# Patient Record
Sex: Male | Born: 1950
Health system: Southern US, Community
[De-identification: ages and names within clinical notes are randomized; demographics above are authoritative.]

## PROBLEM LIST (undated history)

## (undated) DIAGNOSIS — K2970 Gastritis, unspecified, without bleeding: Secondary | ICD-10-CM

## (undated) DIAGNOSIS — K219 Gastro-esophageal reflux disease without esophagitis: Secondary | ICD-10-CM

## (undated) DIAGNOSIS — E559 Vitamin D deficiency, unspecified: Secondary | ICD-10-CM

## (undated) DIAGNOSIS — IMO0002 Reserved for concepts with insufficient information to code with codable children: Secondary | ICD-10-CM

## (undated) DIAGNOSIS — N200 Calculus of kidney: Secondary | ICD-10-CM

## (undated) DIAGNOSIS — Z9889 Other specified postprocedural states: Secondary | ICD-10-CM

## (undated) DIAGNOSIS — E119 Type 2 diabetes mellitus without complications: Secondary | ICD-10-CM

## (undated) DIAGNOSIS — M549 Dorsalgia, unspecified: Secondary | ICD-10-CM

## (undated) DIAGNOSIS — N400 Enlarged prostate without lower urinary tract symptoms: Secondary | ICD-10-CM

## (undated) DIAGNOSIS — T7840XA Allergy, unspecified, initial encounter: Secondary | ICD-10-CM

## (undated) DIAGNOSIS — I10 Essential (primary) hypertension: Secondary | ICD-10-CM

## (undated) DIAGNOSIS — R112 Nausea with vomiting, unspecified: Secondary | ICD-10-CM

## (undated) DIAGNOSIS — Z91030 Bee allergy status: Secondary | ICD-10-CM

## (undated) DIAGNOSIS — E669 Obesity, unspecified: Secondary | ICD-10-CM

## (undated) DIAGNOSIS — E7800 Pure hypercholesterolemia, unspecified: Secondary | ICD-10-CM

## (undated) DIAGNOSIS — IMO0001 Reserved for inherently not codable concepts without codable children: Secondary | ICD-10-CM

## (undated) HISTORY — DX: Reserved for inherently not codable concepts without codable children: IMO0001

## (undated) HISTORY — DX: Vitamin D deficiency, unspecified: E55.9

## (undated) HISTORY — PX: FRACTURE SURGERY: SHX138

## (undated) HISTORY — DX: Gastro-esophageal reflux disease without esophagitis: K21.9

## (undated) HISTORY — DX: Other specified postprocedural states: Z98.890

## (undated) HISTORY — PX: EYE SURGERY: SHX253

## (undated) HISTORY — DX: Bee allergy status: Z91.030

## (undated) HISTORY — PX: INGUINAL HERNIA REPAIR: SHX194

## (undated) HISTORY — DX: Allergy, unspecified, initial encounter: T78.40XA

## (undated) HISTORY — DX: Other specified postprocedural states: R11.2

## (undated) HISTORY — DX: Pure hypercholesterolemia, unspecified: E78.00

## (undated) HISTORY — DX: Dorsalgia, unspecified: M54.9

## (undated) HISTORY — DX: Essential (primary) hypertension: I10

## (undated) HISTORY — DX: Reserved for concepts with insufficient information to code with codable children: IMO0002

## (undated) HISTORY — DX: Calculus of kidney: N20.0

## (undated) HISTORY — DX: Obesity, unspecified: E66.9

## (undated) HISTORY — DX: Benign prostatic hyperplasia without lower urinary tract symptoms: N40.0

## (undated) HISTORY — DX: Gastritis, unspecified, without bleeding: K29.70

## (undated) HISTORY — PX: OTHER SURGICAL HISTORY: SHX169

---

## 1998-05-09 ENCOUNTER — Other Ambulatory Visit: Admission: RE | Admit: 1998-05-09 | Discharge: 1998-05-09 | Payer: Self-pay | Admitting: Urology

## 1998-06-14 ENCOUNTER — Encounter: Payer: Self-pay | Admitting: Urology

## 1998-06-14 ENCOUNTER — Ambulatory Visit (HOSPITAL_COMMUNITY): Admission: RE | Admit: 1998-06-14 | Discharge: 1998-06-14 | Payer: Self-pay | Admitting: Urology

## 1999-07-20 ENCOUNTER — Ambulatory Visit (HOSPITAL_BASED_OUTPATIENT_CLINIC_OR_DEPARTMENT_OTHER): Admission: RE | Admit: 1999-07-20 | Discharge: 1999-07-20 | Payer: Self-pay | Admitting: Surgery

## 2000-10-27 ENCOUNTER — Other Ambulatory Visit: Admission: RE | Admit: 2000-10-27 | Discharge: 2000-10-27 | Payer: Self-pay | Admitting: Gastroenterology

## 2003-08-16 LAB — HM MAMMOGRAPHY

## 2006-01-07 ENCOUNTER — Ambulatory Visit: Payer: Self-pay | Admitting: Gastroenterology

## 2006-01-24 ENCOUNTER — Ambulatory Visit: Payer: Self-pay | Admitting: Gastroenterology

## 2010-09-10 ENCOUNTER — Encounter: Payer: Self-pay | Admitting: Family Medicine

## 2010-09-11 ENCOUNTER — Encounter: Payer: Self-pay | Admitting: Family Medicine

## 2011-04-02 ENCOUNTER — Encounter: Payer: Self-pay | Admitting: Gastroenterology

## 2011-05-04 DIAGNOSIS — K219 Gastro-esophageal reflux disease without esophagitis: Secondary | ICD-10-CM | POA: Insufficient documentation

## 2012-07-11 ENCOUNTER — Inpatient Hospital Stay (HOSPITAL_COMMUNITY)
Admission: EM | Admit: 2012-07-11 | Discharge: 2012-07-23 | DRG: 957 | Disposition: A | Discharge status: Rehabilitation Facility | Payer: 59 | Attending: General Surgery | Admitting: General Surgery

## 2012-07-11 ENCOUNTER — Encounter (HOSPITAL_COMMUNITY): Admission: EM | Disposition: A | Discharge status: Rehabilitation Facility | Payer: Self-pay | Source: Home / Self Care

## 2012-07-11 ENCOUNTER — Emergency Department (HOSPITAL_COMMUNITY): Payer: 59

## 2012-07-11 ENCOUNTER — Encounter (HOSPITAL_COMMUNITY): Payer: Self-pay | Admitting: Anesthesiology

## 2012-07-11 ENCOUNTER — Inpatient Hospital Stay (HOSPITAL_COMMUNITY): Payer: 59 | Admitting: Anesthesiology

## 2012-07-11 ENCOUNTER — Encounter (HOSPITAL_COMMUNITY): Payer: Self-pay | Admitting: Adult Health

## 2012-07-11 DIAGNOSIS — I9589 Other hypotension: Secondary | ICD-10-CM | POA: Diagnosis not present

## 2012-07-11 DIAGNOSIS — T50995A Adverse effect of other drugs, medicaments and biological substances, initial encounter: Secondary | ICD-10-CM | POA: Diagnosis not present

## 2012-07-11 DIAGNOSIS — E119 Type 2 diabetes mellitus without complications: Secondary | ICD-10-CM | POA: Diagnosis present

## 2012-07-11 DIAGNOSIS — S329XXA Fracture of unspecified parts of lumbosacral spine and pelvis, initial encounter for closed fracture: Secondary | ICD-10-CM

## 2012-07-11 DIAGNOSIS — W11XXXA Fall on and from ladder, initial encounter: Secondary | ICD-10-CM | POA: Diagnosis present

## 2012-07-11 DIAGNOSIS — N200 Calculus of kidney: Secondary | ICD-10-CM | POA: Diagnosis present

## 2012-07-11 DIAGNOSIS — S52501A Unspecified fracture of the lower end of right radius, initial encounter for closed fracture: Secondary | ICD-10-CM

## 2012-07-11 DIAGNOSIS — S5290XA Unspecified fracture of unspecified forearm, initial encounter for closed fracture: Secondary | ICD-10-CM

## 2012-07-11 DIAGNOSIS — Z79899 Other long term (current) drug therapy: Secondary | ICD-10-CM

## 2012-07-11 DIAGNOSIS — Y93H2 Activity, gardening and landscaping: Secondary | ICD-10-CM

## 2012-07-11 DIAGNOSIS — J690 Pneumonitis due to inhalation of food and vomit: Secondary | ICD-10-CM | POA: Diagnosis not present

## 2012-07-11 DIAGNOSIS — S42401B Unspecified fracture of lower end of right humerus, initial encounter for open fracture: Secondary | ICD-10-CM

## 2012-07-11 DIAGNOSIS — S32401A Unspecified fracture of right acetabulum, initial encounter for closed fracture: Secondary | ICD-10-CM

## 2012-07-11 DIAGNOSIS — E7800 Pure hypercholesterolemia, unspecified: Secondary | ICD-10-CM | POA: Diagnosis present

## 2012-07-11 DIAGNOSIS — N170 Acute kidney failure with tubular necrosis: Secondary | ICD-10-CM | POA: Diagnosis not present

## 2012-07-11 DIAGNOSIS — S32409A Unspecified fracture of unspecified acetabulum, initial encounter for closed fracture: Secondary | ICD-10-CM

## 2012-07-11 DIAGNOSIS — S3282XA Multiple fractures of pelvis without disruption of pelvic ring, initial encounter for closed fracture: Principal | ICD-10-CM | POA: Diagnosis present

## 2012-07-11 DIAGNOSIS — S52539A Colles' fracture of unspecified radius, initial encounter for closed fracture: Secondary | ICD-10-CM | POA: Diagnosis present

## 2012-07-11 DIAGNOSIS — K567 Ileus, unspecified: Secondary | ICD-10-CM | POA: Diagnosis not present

## 2012-07-11 DIAGNOSIS — R112 Nausea with vomiting, unspecified: Secondary | ICD-10-CM | POA: Diagnosis present

## 2012-07-11 DIAGNOSIS — D62 Acute posthemorrhagic anemia: Secondary | ICD-10-CM | POA: Diagnosis not present

## 2012-07-11 DIAGNOSIS — E876 Hypokalemia: Secondary | ICD-10-CM | POA: Diagnosis not present

## 2012-07-11 DIAGNOSIS — S42409B Unspecified fracture of lower end of unspecified humerus, initial encounter for open fracture: Secondary | ICD-10-CM

## 2012-07-11 DIAGNOSIS — S51001A Unspecified open wound of right elbow, initial encounter: Secondary | ICD-10-CM

## 2012-07-11 DIAGNOSIS — E669 Obesity, unspecified: Secondary | ICD-10-CM | POA: Diagnosis present

## 2012-07-11 DIAGNOSIS — Y838 Other surgical procedures as the cause of abnormal reaction of the patient, or of later complication, without mention of misadventure at the time of the procedure: Secondary | ICD-10-CM | POA: Diagnosis not present

## 2012-07-11 DIAGNOSIS — K560 Paralytic ileus: Secondary | ICD-10-CM | POA: Diagnosis not present

## 2012-07-11 DIAGNOSIS — I10 Essential (primary) hypertension: Secondary | ICD-10-CM | POA: Diagnosis present

## 2012-07-11 DIAGNOSIS — N179 Acute kidney failure, unspecified: Secondary | ICD-10-CM | POA: Diagnosis not present

## 2012-07-11 DIAGNOSIS — J95821 Acute postprocedural respiratory failure: Secondary | ICD-10-CM | POA: Diagnosis not present

## 2012-07-11 DIAGNOSIS — D649 Anemia, unspecified: Secondary | ICD-10-CM

## 2012-07-11 DIAGNOSIS — S32309A Unspecified fracture of unspecified ilium, initial encounter for closed fracture: Secondary | ICD-10-CM

## 2012-07-11 DIAGNOSIS — N400 Enlarged prostate without lower urinary tract symptoms: Secondary | ICD-10-CM | POA: Diagnosis present

## 2012-07-11 DIAGNOSIS — K929 Disease of digestive system, unspecified: Secondary | ICD-10-CM | POA: Diagnosis not present

## 2012-07-11 DIAGNOSIS — W14XXXA Fall from tree, initial encounter: Secondary | ICD-10-CM

## 2012-07-11 DIAGNOSIS — E870 Hyperosmolality and hypernatremia: Secondary | ICD-10-CM | POA: Diagnosis not present

## 2012-07-11 DIAGNOSIS — S32599A Other specified fracture of unspecified pubis, initial encounter for closed fracture: Secondary | ICD-10-CM

## 2012-07-11 DIAGNOSIS — Z7982 Long term (current) use of aspirin: Secondary | ICD-10-CM

## 2012-07-11 DIAGNOSIS — T1490XA Injury, unspecified, initial encounter: Secondary | ICD-10-CM

## 2012-07-11 DIAGNOSIS — S52123B Displaced fracture of head of unspecified radius, initial encounter for open fracture type I or II: Secondary | ICD-10-CM | POA: Diagnosis present

## 2012-07-11 HISTORY — PX: OPEN REDUCTION INTERNAL FIXATION (ORIF) DISTAL RADIAL FRACTURE: SHX5989

## 2012-07-11 HISTORY — DX: Type 2 diabetes mellitus without complications: E11.9

## 2012-07-11 HISTORY — PX: ORIF RADIAL FRACTURE: SHX5113

## 2012-07-11 LAB — URINALYSIS, MICROSCOPIC ONLY
Bilirubin Urine: NEGATIVE
Nitrite: NEGATIVE
Specific Gravity, Urine: 1.019 (ref 1.005–1.030)
pH: 5.5 (ref 5.0–8.0)

## 2012-07-11 LAB — CBC
HCT: 32.2 % — ABNORMAL LOW (ref 39.0–52.0)
Hemoglobin: 11 g/dL — ABNORMAL LOW (ref 13.0–17.0)
MCH: 30.4 pg (ref 26.0–34.0)
MCHC: 34.2 g/dL (ref 30.0–36.0)
RDW: 13.2 % (ref 11.5–15.5)

## 2012-07-11 LAB — COMPREHENSIVE METABOLIC PANEL
Albumin: 3.2 g/dL — ABNORMAL LOW (ref 3.5–5.2)
BUN: 18 mg/dL (ref 6–23)
Calcium: 8.5 mg/dL (ref 8.4–10.5)
GFR calc Af Amer: 68 mL/min — ABNORMAL LOW (ref >90)
Glucose, Bld: 204 mg/dL — ABNORMAL HIGH (ref 70–99)
Sodium: 141 mEq/L (ref 135–145)
Total Protein: 6 g/dL (ref 6.0–8.3)

## 2012-07-11 LAB — POCT I-STAT 4, (NA,K, GLUC, HGB,HCT)
Glucose, Bld: 181 mg/dL — ABNORMAL HIGH (ref 70–99)
HCT: 26 % — ABNORMAL LOW (ref 39.0–52.0)
Hemoglobin: 8.8 g/dL — ABNORMAL LOW (ref 13.0–17.0)
Potassium: 3.7 mEq/L (ref 3.5–5.1)
Sodium: 146 mEq/L — ABNORMAL HIGH (ref 135–145)

## 2012-07-11 LAB — POCT I-STAT, CHEM 8
BUN: 17 mg/dL (ref 6–23)
Calcium, Ion: 0.92 mmol/L — ABNORMAL LOW (ref 1.13–1.30)
Creatinine, Ser: 1.3 mg/dL (ref 0.50–1.35)
HCT: 24 % — ABNORMAL LOW (ref 39.0–52.0)
Potassium: 3.2 mEq/L — ABNORMAL LOW (ref 3.5–5.1)
Sodium: 145 mEq/L (ref 135–145)
TCO2: 15 mmol/L (ref 0–100)

## 2012-07-11 LAB — GLUCOSE, CAPILLARY: Glucose-Capillary: 202 mg/dL — ABNORMAL HIGH (ref 70–99)

## 2012-07-11 LAB — PROTIME-INR: Prothrombin Time: 14.3 seconds (ref 11.6–15.2)

## 2012-07-11 LAB — ABO/RH: ABO/RH(D): O NEG

## 2012-07-11 SURGERY — OPEN REDUCTION INTERNAL FIXATION (ORIF) DISTAL RADIUS FRACTURE
Anesthesia: General | Site: Wrist | Laterality: Right | Wound class: Clean

## 2012-07-11 MED ORDER — PROMETHAZINE HCL 25 MG/ML IJ SOLN: 6.2500 mg | INTRAMUSCULAR | Status: DC | PRN

## 2012-07-11 MED ORDER — FENTANYL CITRATE 0.05 MG/ML IJ SOLN: 100.0000 ug | Freq: Once | INTRAMUSCULAR | Status: AC

## 2012-07-11 MED ORDER — GLYCOPYRROLATE 0.2 MG/ML IJ SOLN
INTRAMUSCULAR | Status: DC | PRN
  Administered 2012-07-11: .6 mg via INTRAVENOUS

## 2012-07-11 MED ORDER — CEFAZOLIN SODIUM-DEXTROSE 2-3 GM-% IV SOLR
INTRAVENOUS | Status: DC | PRN
  Administered 2012-07-11: 2 g via INTRAVENOUS

## 2012-07-11 MED ORDER — KCL IN DEXTROSE-NACL 20-5-0.45 MEQ/L-%-% IV SOLN
INTRAVENOUS | Status: DC
  Administered 2012-07-12: 1000 mL via INTRAVENOUS
  Administered 2012-07-12 – 2012-07-14 (×3): 100 mL/h via INTRAVENOUS
  Administered 2012-07-16: 07:00:00 via INTRAVENOUS
  Filled 2012-07-11 (×15): qty 1000

## 2012-07-11 MED ORDER — ALBUMIN HUMAN 5 % IV SOLN
INTRAVENOUS | Status: DC | PRN
  Administered 2012-07-11 (×3): via INTRAVENOUS

## 2012-07-11 MED ORDER — CEFAZOLIN SODIUM 1-5 GM-% IV SOLN
1.0000 g | Freq: Once | INTRAVENOUS | Status: AC
  Administered 2012-07-11: 1 g via INTRAVENOUS
  Filled 2012-07-11: qty 50

## 2012-07-11 MED ORDER — OXYCODONE HCL 5 MG/5ML PO SOLN: 5.0000 mg | Freq: Once | ORAL | Status: AC | PRN

## 2012-07-11 MED ORDER — ONDANSETRON HCL 4 MG/2ML IJ SOLN
4.0000 mg | Freq: Four times a day (QID) | INTRAMUSCULAR | Status: DC | PRN
  Administered 2012-07-13 (×2): 4 mg via INTRAVENOUS

## 2012-07-11 MED ORDER — MIDAZOLAM HCL 5 MG/5ML IJ SOLN
INTRAMUSCULAR | Status: DC | PRN
  Administered 2012-07-11: 1 mg via INTRAVENOUS

## 2012-07-11 MED ORDER — POTASSIUM CHLORIDE 10 MEQ/100ML IV SOLN: 10.0000 meq | Freq: Once | INTRAVENOUS | Status: DC

## 2012-07-11 MED ORDER — LIDOCAINE HCL (CARDIAC) 20 MG/ML IV SOLN
INTRAVENOUS | Status: DC | PRN
  Administered 2012-07-11: 100 mg via INTRAVENOUS

## 2012-07-11 MED ORDER — SODIUM CHLORIDE 0.9 % IJ SOLN
9.0000 mL | INTRAMUSCULAR | Status: DC | PRN
  Administered 2012-07-14: 12:00:00 via INTRAVENOUS

## 2012-07-11 MED ORDER — SUCCINYLCHOLINE CHLORIDE 20 MG/ML IJ SOLN
INTRAMUSCULAR | Status: DC | PRN
  Administered 2012-07-11: 120 mg via INTRAVENOUS

## 2012-07-11 MED ORDER — NEOSTIGMINE METHYLSULFATE 1 MG/ML IJ SOLN
INTRAMUSCULAR | Status: DC | PRN
  Administered 2012-07-11: 3.5 mg via INTRAVENOUS

## 2012-07-11 MED ORDER — NALOXONE HCL 0.4 MG/ML IJ SOLN: 0.4000 mg | INTRAMUSCULAR | Status: DC | PRN

## 2012-07-11 MED ORDER — LACTATED RINGERS IV SOLN
INTRAVENOUS | Status: DC | PRN
  Administered 2012-07-11 (×2): via INTRAVENOUS

## 2012-07-11 MED ORDER — HYDROMORPHONE 0.3 MG/ML IV SOLN
INTRAVENOUS | Status: AC
  Filled 2012-07-11: qty 25

## 2012-07-11 MED ORDER — ONDANSETRON HCL 4 MG/2ML IJ SOLN
INTRAMUSCULAR | Status: AC
  Filled 2012-07-11: qty 2

## 2012-07-11 MED ORDER — ONDANSETRON HCL 4 MG/2ML IJ SOLN
INTRAMUSCULAR | Status: AC
  Administered 2012-07-11: 4 mg
  Filled 2012-07-11: qty 2

## 2012-07-11 MED ORDER — 0.9 % SODIUM CHLORIDE (POUR BTL) OPTIME
TOPICAL | Status: DC | PRN
  Administered 2012-07-11: 1000 mL

## 2012-07-11 MED ORDER — FENTANYL CITRATE 0.05 MG/ML IJ SOLN
100.0000 ug | Freq: Once | INTRAMUSCULAR | Status: AC
  Administered 2012-07-11: 100 ug via INTRAVENOUS

## 2012-07-11 MED ORDER — DIPHENHYDRAMINE HCL 12.5 MG/5ML PO ELIX
12.5000 mg | ORAL_SOLUTION | Freq: Four times a day (QID) | ORAL | Status: DC | PRN
  Filled 2012-07-11: qty 5

## 2012-07-11 MED ORDER — FENTANYL CITRATE 0.05 MG/ML IJ SOLN
INTRAMUSCULAR | Status: DC | PRN
  Administered 2012-07-11 (×6): 50 ug via INTRAVENOUS

## 2012-07-11 MED ORDER — PROPOFOL 10 MG/ML IV BOLUS
INTRAVENOUS | Status: DC | PRN
  Administered 2012-07-11: 150 mg via INTRAVENOUS

## 2012-07-11 MED ORDER — PHENYLEPHRINE HCL 10 MG/ML IJ SOLN
INTRAMUSCULAR | Status: DC | PRN
  Administered 2012-07-11 (×6): 40 ug via INTRAVENOUS

## 2012-07-11 MED ORDER — SODIUM CHLORIDE 0.9 % IV SOLN
INTRAVENOUS | Status: DC | PRN
  Administered 2012-07-11: 19:00:00 via INTRAVENOUS

## 2012-07-11 MED ORDER — IOHEXOL 300 MG/ML  SOLN
100.0000 mL | Freq: Once | INTRAMUSCULAR | Status: AC | PRN
  Administered 2012-07-11: 100 mL via INTRAVENOUS

## 2012-07-11 MED ORDER — FENTANYL CITRATE 0.05 MG/ML IJ SOLN
100.0000 ug | Freq: Once | INTRAMUSCULAR | Status: AC
  Administered 2012-07-11: 100 ug via INTRAVENOUS
  Filled 2012-07-11: qty 2

## 2012-07-11 MED ORDER — ARTIFICIAL TEARS OP OINT
TOPICAL_OINTMENT | OPHTHALMIC | Status: DC | PRN
  Administered 2012-07-11: 1 via OPHTHALMIC

## 2012-07-11 MED ORDER — FENTANYL CITRATE 0.05 MG/ML IJ SOLN
INTRAMUSCULAR | Status: AC
  Administered 2012-07-11: 100 ug via INTRAVENOUS
  Filled 2012-07-11: qty 2

## 2012-07-11 MED ORDER — ONDANSETRON HCL 4 MG/2ML IJ SOLN
INTRAMUSCULAR | Status: DC | PRN
  Administered 2012-07-11: 4 mg via INTRAVENOUS

## 2012-07-11 MED ORDER — DIPHENHYDRAMINE HCL 50 MG/ML IJ SOLN: 12.5000 mg | Freq: Four times a day (QID) | INTRAMUSCULAR | Status: DC | PRN

## 2012-07-11 MED ORDER — ACETAMINOPHEN 10 MG/ML IV SOLN
INTRAVENOUS | Status: DC | PRN
  Administered 2012-07-11: 1000 mg via INTRAVENOUS

## 2012-07-11 MED ORDER — FENTANYL CITRATE 0.05 MG/ML IJ SOLN
INTRAMUSCULAR | Status: AC
  Filled 2012-07-11: qty 2

## 2012-07-11 MED ORDER — HYDROMORPHONE HCL PF 1 MG/ML IJ SOLN: 0.2500 mg | INTRAMUSCULAR | Status: DC | PRN

## 2012-07-11 MED ORDER — OXYCODONE HCL 5 MG PO TABS: 5.0000 mg | ORAL_TABLET | Freq: Once | ORAL | Status: AC | PRN

## 2012-07-11 MED ORDER — PHENYLEPHRINE HCL 10 MG/ML IJ SOLN
10.0000 mg | INTRAVENOUS | Status: DC | PRN
  Administered 2012-07-11: 50 ug/min via INTRAVENOUS

## 2012-07-11 MED ORDER — DEXTROSE 5 % IV SOLN
INTRAVENOUS | Status: DC | PRN
  Administered 2012-07-11: 19:00:00 via INTRAVENOUS

## 2012-07-11 MED ORDER — CEFAZOLIN SODIUM 1-5 GM-% IV SOLN
1.0000 g | Freq: Three times a day (TID) | INTRAVENOUS | Status: DC
  Administered 2012-07-12 – 2012-07-14 (×8): 1 g via INTRAVENOUS
  Filled 2012-07-11 (×9): qty 50

## 2012-07-11 MED ORDER — DIPHENHYDRAMINE HCL 50 MG/ML IJ SOLN
INTRAMUSCULAR | Status: DC | PRN
  Administered 2012-07-11: 25 mg via INTRAVENOUS

## 2012-07-11 MED ORDER — HYDROMORPHONE 0.3 MG/ML IV SOLN
INTRAVENOUS | Status: DC
  Administered 2012-07-12: 3 mg via INTRAVENOUS
  Administered 2012-07-12: 2.4 mg via INTRAVENOUS
  Administered 2012-07-12: 2.1 mg via INTRAVENOUS
  Administered 2012-07-12: 2.4 mg via INTRAVENOUS
  Administered 2012-07-13: 1.5 mg via INTRAVENOUS
  Administered 2012-07-13: 0.3 mg via INTRAVENOUS
  Administered 2012-07-13: 0.9 mg via INTRAVENOUS
  Administered 2012-07-13: 0.6 mg via INTRAVENOUS
  Administered 2012-07-14: 0.9 mg via INTRAVENOUS
  Administered 2012-07-14: 0.3 mg via INTRAVENOUS
  Administered 2012-07-14: 0.9 mg via INTRAVENOUS
  Filled 2012-07-11 (×3): qty 25

## 2012-07-11 MED ORDER — ROCURONIUM BROMIDE 100 MG/10ML IV SOLN
INTRAVENOUS | Status: DC | PRN
  Administered 2012-07-11: 10 mg via INTRAVENOUS
  Administered 2012-07-11: 20 mg via INTRAVENOUS
  Administered 2012-07-11: 50 mg via INTRAVENOUS
  Administered 2012-07-11: 10 mg via INTRAVENOUS

## 2012-07-11 SURGICAL SUPPLY — 70 items
BANDAGE ELASTIC 3 VELCRO ST LF (GAUZE/BANDAGES/DRESSINGS) ×4 IMPLANT
BANDAGE ELASTIC 4 VELCRO ST LF (GAUZE/BANDAGES/DRESSINGS) ×8 IMPLANT
BANDAGE GAUZE ELAST BULKY 4 IN (GAUZE/BANDAGES/DRESSINGS) ×8 IMPLANT
BIT DRILL 2 FAST STEP (BIT) ×4 IMPLANT
BIT DRILL 2.5X4 QC (BIT) ×4 IMPLANT
BLADE LONG MED 31X9 (MISCELLANEOUS) ×4 IMPLANT
BLADE SURG 15 STRL LF DISP TIS (BLADE) ×3 IMPLANT
BLADE SURG 15 STRL SS (BLADE) ×4
BNDG CMPR 9X4 STRL LF SNTH (GAUZE/BANDAGES/DRESSINGS) ×3
BNDG ESMARK 4X9 LF (GAUZE/BANDAGES/DRESSINGS) ×4 IMPLANT
CLOTH BEACON ORANGE TIMEOUT ST (SAFETY) ×4 IMPLANT
CORDS BIPOLAR (ELECTRODE) ×4 IMPLANT
COVER SURGICAL LIGHT HANDLE (MISCELLANEOUS) ×4 IMPLANT
CUFF TOURNIQUET SINGLE 18IN (TOURNIQUET CUFF) ×4 IMPLANT
CUFF TOURNIQUET SINGLE 24IN (TOURNIQUET CUFF) IMPLANT
DRAPE C-ARM MINI 42X72 WSTRAPS (DRAPES) ×4 IMPLANT
DRAPE OEC MINIVIEW 54X84 (DRAPES) ×4 IMPLANT
DRAPE SURG 17X23 STRL (DRAPES) ×4 IMPLANT
DRSG ADAPTIC 3X8 NADH LF (GAUZE/BANDAGES/DRESSINGS) ×4 IMPLANT
DRSG EMULSION OIL 3X3 NADH (GAUZE/BANDAGES/DRESSINGS) ×8 IMPLANT
DURAPREP 26ML APPLICATOR (WOUND CARE) ×4 IMPLANT
ELECT REM PT RETURN 9FT ADLT (ELECTROSURGICAL) ×4
ELECTRODE REM PT RTRN 9FT ADLT (ELECTROSURGICAL) ×3 IMPLANT
GAUZE SPONGE 4X4 16PLY XRAY LF (GAUZE/BANDAGES/DRESSINGS) ×4 IMPLANT
GAUZE XEROFORM 1X8 LF (GAUZE/BANDAGES/DRESSINGS) ×4 IMPLANT
GLOVE BIO SURGEON STRL SZ8.5 (GLOVE) ×4 IMPLANT
GOWN SRG XL XLNG 56XLVL 4 (GOWN DISPOSABLE) ×3 IMPLANT
GOWN STRL NON-REIN LRG LVL3 (GOWN DISPOSABLE) ×4 IMPLANT
GOWN STRL NON-REIN XL XLG LVL4 (GOWN DISPOSABLE) ×4
HEAD EXPLOR 12X24MM (Orthopedic Implant) ×4 IMPLANT
IMPL STEM WITH SCREW 8X29 (Stem) ×3 IMPLANT
IMPLANT STEM WITH SCREW 8X29 (Stem) ×4 IMPLANT
KIT BASIN OR (CUSTOM PROCEDURE TRAY) ×4 IMPLANT
KIT ROOM TURNOVER OR (KITS) ×4 IMPLANT
MANIFOLD NEPTUNE II (INSTRUMENTS) IMPLANT
NEEDLE HYPO 25GX1X1/2 BEV (NEEDLE) IMPLANT
NS IRRIG 1000ML POUR BTL (IV SOLUTION) ×4 IMPLANT
PACK ORTHO EXTREMITY (CUSTOM PROCEDURE TRAY) ×4 IMPLANT
PAD ARMBOARD 7.5X6 YLW CONV (MISCELLANEOUS) ×8 IMPLANT
PAD CAST 3X4 CTTN HI CHSV (CAST SUPPLIES) ×3 IMPLANT
PAD CAST 4YDX4 CTTN HI CHSV (CAST SUPPLIES) ×3 IMPLANT
PADDING CAST COTTON 3X4 STRL (CAST SUPPLIES) ×4
PADDING CAST COTTON 4X4 STRL (CAST SUPPLIES) ×4
PEG SUBCHONDRAL SMOOTH 2.0X22 (Peg) ×4 IMPLANT
PEG SUBCHONDRAL SMOOTH 2.0X24 (Peg) ×16 IMPLANT
PEG SUBCHONDRAL SMOOTH 2.0X26 (Peg) ×8 IMPLANT
PENCIL BUTTON HOLSTER BLD 10FT (ELECTRODE) ×4 IMPLANT
PLATE STAN 24.4X59.5 RT (Plate) ×4 IMPLANT
SCREW CORT 3.5X14 LNG (Screw) ×8 IMPLANT
SCREW CORT 3.5X16 LNG (Screw) ×4 IMPLANT
SPLINT PLASTER CAST XFAST 5X30 (CAST SUPPLIES) ×3 IMPLANT
SPLINT PLASTER XFAST SET 5X30 (CAST SUPPLIES) ×1
SPONGE GAUZE 4X4 12PLY (GAUZE/BANDAGES/DRESSINGS) ×12 IMPLANT
SPONGE LAP 4X18 X RAY DECT (DISPOSABLE) IMPLANT
SPONGE SCRUB IODOPHOR (GAUZE/BANDAGES/DRESSINGS) ×4 IMPLANT
SUCTION FRAZIER TIP 10 FR DISP (SUCTIONS) ×4 IMPLANT
SUT FIBERWIRE 2-0 18 17.9 3/8 (SUTURE) ×4
SUT PROLENE 3 0 PS 2 (SUTURE) IMPLANT
SUT VIC AB 0 CT1 27 (SUTURE) ×8
SUT VIC AB 0 CT1 27XBRD ANBCTR (SUTURE) ×6 IMPLANT
SUT VIC AB 3-0 FS2 27 (SUTURE) IMPLANT
SUT VICRYL 4-0 PS2 18IN ABS (SUTURE) IMPLANT
SUT VICRYL RAPIDE 4/0 PS 2 (SUTURE) ×12 IMPLANT
SUTURE FIBERWR 2-0 18 17.9 3/8 (SUTURE) ×3 IMPLANT
SYR CONTROL 10ML LL (SYRINGE) IMPLANT
TOWEL OR 17X24 6PK STRL BLUE (TOWEL DISPOSABLE) ×4 IMPLANT
TOWEL OR 17X26 10 PK STRL BLUE (TOWEL DISPOSABLE) ×4 IMPLANT
TUBE CONNECTING 12X1/4 (SUCTIONS) ×4 IMPLANT
UNDERPAD 30X30 INCONTINENT (UNDERPADS AND DIAPERS) ×4 IMPLANT
WATER STERILE IRR 1000ML POUR (IV SOLUTION) ×4 IMPLANT

## 2012-07-11 NOTE — Progress Notes (Signed)
Orthopedic Tech Progress Note Patient Details:  Roger Palmer 28-Apr-1951 [id number]  Patient ID: Roger Palmer, male   DOB: 1951-03-23, 62 y.o.   MRN: [id number] Made level 2 trauma visit  Nikki Dom 07/11/2012, 4:01 PM

## 2012-07-11 NOTE — ED Notes (Signed)
Family at beside. Family given emotional support. 

## 2012-07-11 NOTE — Transfer of Care (Signed)
Immediate Anesthesia Transfer of Care Note  Patient: Roger Palmer  Procedure(s) Performed: Procedure(s): OPEN REDUCTION INTERNAL FIXATION (ORIF) DISTAL RADIAL FRACTURE (Right) OPEN REDUCTION INTERNAL FIXATION (ORIF) RADIAL head FRACTURE (Right)  Patient Location: PACU  Anesthesia Type:General  Level of Consciousness: awake and patient cooperative  Airway & Oxygen Therapy: Patient Spontanous Breathing and Patient connected to face mask oxygen  Post-op Assessment: Report given to PACU RN and Post -op Vital signs reviewed and stable  Post vital signs: Reviewed and stable  Complications: No apparent anesthesia complications

## 2012-07-11 NOTE — ED Notes (Signed)
Family updated as to patient's status.

## 2012-07-11 NOTE — ED Notes (Signed)
Pt to CT with RN accompanyment. BP cuff remoeved for CT contrast, right arm break.

## 2012-07-11 NOTE — ED Notes (Signed)
Pt on ladder at home working on tree and fell off ladder landing on right side. Did not hit head, denies LOC, denies neck pain. Right wrist and right elbow deformity, open wound to right elbow. Right hip pain, rotation noted. CMS intact. GCS 15. Given 10 mg morphine by EMS

## 2012-07-11 NOTE — Op Note (Signed)
See dictated note [phone]

## 2012-07-11 NOTE — Anesthesia Postprocedure Evaluation (Signed)
  Anesthesia Post-op Note  Patient: Roger Palmer  Procedure(s) Performed: Procedure(s): OPEN REDUCTION INTERNAL FIXATION (ORIF) DISTAL RADIAL FRACTURE (Right) OPEN REDUCTION INTERNAL FIXATION (ORIF) RADIAL head FRACTURE (Right)  Patient Location: PACU  Anesthesia Type:General  Level of Consciousness: awake  Airway and Oxygen Therapy: Patient Spontanous Breathing  Post-op Pain: mild  Post-op Assessment: Post-op Vital signs reviewed, Patient's Cardiovascular Status Stable, Respiratory Function Stable, Patent Airway, No signs of Nausea or vomiting and Pain level controlled  Post-op Vital Signs: stable  Complications: No apparent anesthesia complications

## 2012-07-11 NOTE — ED Notes (Signed)
Dr.Beaton shown results of Istat Chem8. ED-Lab.

## 2012-07-11 NOTE — Consult Note (Signed)
Roger Heap, MD Chief Complaint: Fall from tree History: Patient was on a 15 foot ladder cutting tree limbs when he fell. He had no loss of consciousness. He fell on his right side. He was transported via [facility] as a level II trauma. On arrival he was evaluated by emergency department physician. He was found to have open right elbow dislocation, right distal radius fracture, right iliac and acetabular fractures. His initial hemoglobin was noted to be low and he was therefore upgraded to a level one trauma. On my arrival patient was in the CT scanner, awake and alert. In addition to the above complaints, he has some tingling in his right hand, nausea, and vomiting.  Past Medical History  Diagnosis Date  . Bee sting allergy   . Hypercholesteremia   . Nephrolithiasis   . Herniated disc     c7-c7, c5-6, c3-4, c4-5  . Hypertension   . Back pain   . Obesity   . BPH (benign prostatic hypertrophy)   . Gastritis   . Reflux   . Diabetes mellitus without complication     No Known Allergies  No current facility-administered medications on file prior to encounter.   Current Outpatient Prescriptions on File Prior to Encounter  Medication Sig Dispense Refill  . dutasteride (AVODART) 0.5 MG capsule Take 0.5 mg by mouth daily.        . metFORMIN (GLUCOPHAGE) 500 MG tablet Take 500 mg by mouth daily.       Marland Kitchen omeprazole (PRILOSEC) 20 MG capsule Take 20 mg by mouth daily.        . quinapril (ACCUPRIL) 40 MG tablet Take 40 mg by mouth at bedtime.        . Tamsulosin HCl (FLOMAX) 0.4 MG CAPS Take 0.4 mg by mouth daily.       . valsartan-hydrochlorothiazide (DIOVAN-HCT) 160-25 MG per tablet Take 1 tablet by mouth daily.          Physical Exam: Filed Vitals:   07/11/12 1805  BP: 127/71  Pulse: 110  Temp:   Resp: 11  A+0 X 3 NVI - EHL/TA/GA intact 2+ DP/PT pulses Compartments soft/NT No SOB/CP Abd soft/NT Pelvis - right sided tenderness.  No ecchymosis/laceration/abrasion      Image: Dg Forearm Right  07/11/2012  *RADIOLOGY REPORT*  Clinical Data:   Fall, right arm pain.  Open fracture.  RIGHT FOREARM - 2 VIEW  Comparison: None.  Findings: Fracture is noted at the right elbow.  The distal humerus projects anteriorly relative to the radius and ulna. It is difficult to evaluate for fracture on these limited views of the elbow.  There is a comminuted distal right radial fracture.  Mild impaction.  Displacement fracture fragments.  No visible distal ulnar fracture.  IMPRESSION: Dislocation at the right elbow.  It is difficult to exclude fracture of the elbow on these projections.  Consider dedicated elbow series.  Comminuted, displaced and impacted distal right radial fracture.   Original Report Authenticated By: Charlett Nose, M.D.    Ct Head Wo Contrast  07/11/2012  *RADIOLOGY REPORT*  Clinical Data:  15 feet fall from ladder  CT HEAD WITHOUT CONTRAST CT CERVICAL SPINE WITHOUT CONTRAST  Technique:  Multidetector CT imaging of the head and cervical spine was performed following the standard protocol without intravenous contrast.  Multiplanar CT image reconstructions of the cervical spine were also generated.  Comparison:  None.  CT HEAD  Findings: No evidence of parenchymal hemorrhage or extra-axial fluid collection. No  mass lesion, mass effect, or midline shift.  No CT evidence of acute infarction.  Cerebral volume is age appropriate.  No ventriculomegaly.  The visualized paranasal sinuses are essentially clear. The mastoid air cells are unopacified.  No evidence of calvarial fracture.  IMPRESSION: No evidence of acute intracranial abnormality.  CT CERVICAL SPINE  Findings: Straightening of cervical spine.  No evidence of fracture or dislocation.  Vertebral body heights are maintained.  The dens appears intact.  No prevertebral soft tissue swelling.  Mild multilevel degenerative changes.  Visualized thyroid is unremarkable.  Visualized lung apices are clear.  IMPRESSION: No evidence  of traumatic injury to the cervical spine.  Mild multilevel degenerative changes.   Original Report Authenticated By: Charline Bills, M.D.    Ct Chest W Contrast  07/11/2012  *RADIOLOGY REPORT*  Clinical Data:  15 feet fall from ladder  CT CHEST, ABDOMEN AND PELVIS WITH CONTRAST  Technique:  Multidetector CT imaging of the chest, abdomen and pelvis was performed following the standard protocol during bolus administration of intravenous contrast.  Contrast:  100 ml Omnipaque-300 IV  Comparison:  None.  CT CHEST  Findings:  No evidence of acute aortic injury.  No mediastinal hematoma.  Mild dependent atelectasis lung bases.  No suspicious pulmonary nodules.  No pleural effusion or pneumothorax.  Visualized thyroid is unremarkable.  The heart is normal in size.  No pericardial effusion.  No suspicious mediastinal, hilar, or axillary lymphadenopathy.  Mild degenerative changes of the thoracic spine.  No fracture is seen.  IMPRESSION: No evidence of traumatic injury to the chest.  CT ABDOMEN AND PELVIS  Findings:  Liver, spleen, pancreas, and adrenal glands are within normal limits.  Gallbladder is unremarkable.  No intrahepatic or extrahepatic ductal dilatation.  2 mm nonobstructing the left upper pole renal calculus (series 2/image 70).  3 mm nonobstructing left lower pole renal calculus (series 2/image 77).  Right kidney is within normal limits.  No hydronephrosis.  No evidence of bowel obstruction.  Normal appendix.  Atherosclerotic calcifications of the abdominal aorta and branch vessels.  No abdominopelvic ascites.  No free air.  Mild prostatomegaly, measuring 5.3 cm in transverse dimension, and notable for dystrophic calcifications.  Bladder is notable for indwelling Foley catheter.  Displaced fracture of the right iliac crest (series 2/image 101) extending to the superior and medial walls of the right acetabulum (series 2/images 117 and 125). Nondisplaced right inferior pubic ramus fracture (series 2/image  131).  Associated small volume right retroperitoneal/pelvic hemorrhage (series 2/image 125).  Mild degenerative changes of the lumbar spine.  IMPRESSION: Displaced fracture of the right iliac crest with associated comminuted acetabular fracture.  Nondisplaced right inferior pubic ramus fracture.  Associated small volume right retroperitoneal/pelvic hemorrhage.   Original Report Authenticated By: Charline Bills, M.D.    Ct Cervical Spine Wo Contrast  07/11/2012  *RADIOLOGY REPORT*  Clinical Data:  15 feet fall from ladder  CT HEAD WITHOUT CONTRAST CT CERVICAL SPINE WITHOUT CONTRAST  Technique:  Multidetector CT imaging of the head and cervical spine was performed following the standard protocol without intravenous contrast.  Multiplanar CT image reconstructions of the cervical spine were also generated.  Comparison:  None.  CT HEAD  Findings: No evidence of parenchymal hemorrhage or extra-axial fluid collection. No mass lesion, mass effect, or midline shift.  No CT evidence of acute infarction.  Cerebral volume is age appropriate.  No ventriculomegaly.  The visualized paranasal sinuses are essentially clear. The mastoid air cells are unopacified.  No evidence  of calvarial fracture.  IMPRESSION: No evidence of acute intracranial abnormality.  CT CERVICAL SPINE  Findings: Straightening of cervical spine.  No evidence of fracture or dislocation.  Vertebral body heights are maintained.  The dens appears intact.  No prevertebral soft tissue swelling.  Mild multilevel degenerative changes.  Visualized thyroid is unremarkable.  Visualized lung apices are clear.  IMPRESSION: No evidence of traumatic injury to the cervical spine.  Mild multilevel degenerative changes.   Original Report Authenticated By: Charline Bills, M.D.    Ct Abdomen Pelvis W Contrast  07/11/2012  *RADIOLOGY REPORT*  Clinical Data:  15 feet fall from ladder  CT CHEST, ABDOMEN AND PELVIS WITH CONTRAST  Technique:  Multidetector CT imaging of the  chest, abdomen and pelvis was performed following the standard protocol during bolus administration of intravenous contrast.  Contrast:  100 ml Omnipaque-300 IV  Comparison:  None.  CT CHEST  Findings:  No evidence of acute aortic injury.  No mediastinal hematoma.  Mild dependent atelectasis lung bases.  No suspicious pulmonary nodules.  No pleural effusion or pneumothorax.  Visualized thyroid is unremarkable.  The heart is normal in size.  No pericardial effusion.  No suspicious mediastinal, hilar, or axillary lymphadenopathy.  Mild degenerative changes of the thoracic spine.  No fracture is seen.  IMPRESSION: No evidence of traumatic injury to the chest.  CT ABDOMEN AND PELVIS  Findings:  Liver, spleen, pancreas, and adrenal glands are within normal limits.  Gallbladder is unremarkable.  No intrahepatic or extrahepatic ductal dilatation.  2 mm nonobstructing the left upper pole renal calculus (series 2/image 70).  3 mm nonobstructing left lower pole renal calculus (series 2/image 77).  Right kidney is within normal limits.  No hydronephrosis.  No evidence of bowel obstruction.  Normal appendix.  Atherosclerotic calcifications of the abdominal aorta and branch vessels.  No abdominopelvic ascites.  No free air.  Mild prostatomegaly, measuring 5.3 cm in transverse dimension, and notable for dystrophic calcifications.  Bladder is notable for indwelling Foley catheter.  Displaced fracture of the right iliac crest (series 2/image 101) extending to the superior and medial walls of the right acetabulum (series 2/images 117 and 125). Nondisplaced right inferior pubic ramus fracture (series 2/image 131).  Associated small volume right retroperitoneal/pelvic hemorrhage (series 2/image 125).  Mild degenerative changes of the lumbar spine.  IMPRESSION: Displaced fracture of the right iliac crest with associated comminuted acetabular fracture.  Nondisplaced right inferior pubic ramus fracture.  Associated small volume right  retroperitoneal/pelvic hemorrhage.   Original Report Authenticated By: Charline Bills, M.D.    Dg Pelvis Portable  07/11/2012  *RADIOLOGY REPORT*  Clinical Data: Fall, right hip pain.  PORTABLE PELVIS  Comparison: None.  Findings: Cortical disruption and irregularity noted in the region of the right ischium and medial acetabulum compatible with acetabular fracture.  No proximal femoral fracture.  SI joints are symmetric and unremarkable.  IMPRESSION: Right medial acetabular fracture.   Original Report Authenticated By: Charlett Nose, M.D.    Dg Chest Portable 1 View  07/11/2012  *RADIOLOGY REPORT*  Clinical Data: Trauma, fall  PORTABLE CHEST - 1 VIEW  Comparison: None.  Findings: Lungs are clear. No pleural effusion or pneumothorax.  Cardiomediastinal silhouette is within normal limits.  IMPRESSION: No evidence of acute cardiopulmonary disease.   Original Report Authenticated By: Charline Bills, M.D.     A/P:  Patient s/p fall from 15 ft  With right open UE injury and closed right acetabular fracture.   Medial impaction of femoral head  due to comminution of both bones acetabular fracture Plan on OR with Dr. Mina Marble for I&D and fixation of right elbow injury Will place distal femoral traction pin in right femur to decompress hip and provide some stabilization Will review with Dr Carola Frost for definitive fracture management.

## 2012-07-11 NOTE — ED Notes (Signed)
RIght arm +1 radial pulse, cms intact

## 2012-07-11 NOTE — H&P (Signed)
Roger Palmer is an 62 y.o. male.   Chief Complaint: Right arm pain and right hip pain after fall HPI: Patient was on a 15 foot ladder cutting tree limbs when he fell. He had no loss of consciousness. He fell on his right side. He was transported via [facility] as a level II trauma. On arrival he was evaluated by emergency department physician. He was found to have open right elbow dislocation, right distal radius fracture, right iliac and acetabular fractures. His initial hemoglobin was noted to be low and he was therefore upgraded to a level one trauma. On my arrival patient was in the CT scanner, awake and alert. In addition to the above complaints, he has some tingling in his right hand, nausea, and vomiting.  Past Medical History  Diagnosis Date  . Bee sting allergy   . Hypercholesteremia   . Nephrolithiasis   . Herniated disc     c7-c7, c5-6, c3-4, c4-5  . Hypertension   . Back pain   . Obesity   . BPH (benign prostatic hypertrophy)   . Gastritis   . Reflux   . Diabetes mellitus without complication     Past Surgical History  Procedure Laterality Date  . Inguinal hernia repair      right  . Kidney stones      x2 Dr. Earlene Plater     Family History  Problem Relation Age of Onset  . Heart attack Mother   . Diabetes Mother     NIDDM  . Hypertension Mother   . Hypertension Father   . Stroke Father   . Nephrolithiasis Brother    Social History:  reports that he has never smoked. He does not have any smokeless tobacco history on file. He reports that he does not drink alcohol or use illicit drugs.  Allergies: No Known Allergies   (Not in a hospital admission)  Results for orders placed during the hospital encounter of 07/11/12 (from the past 48 hour(s))  URINALYSIS, MICROSCOPIC ONLY     Status: Abnormal   Collection Time    07/11/12  4:24 PM      Result Value Range   Color, Urine YELLOW  YELLOW   APPearance CLEAR  CLEAR   Specific Gravity, Urine 1.019  1.005 -  1.030   pH 5.5  5.0 - 8.0   Glucose, UA 100 (*) NEGATIVE mg/dL   Hgb urine dipstick LARGE (*) NEGATIVE   Bilirubin Urine NEGATIVE  NEGATIVE   Ketones, ur NEGATIVE  NEGATIVE mg/dL   Protein, ur NEGATIVE  NEGATIVE mg/dL   Urobilinogen, UA 0.2  0.0 - 1.0 mg/dL   Nitrite NEGATIVE  NEGATIVE   Leukocytes, UA TRACE (*) NEGATIVE   WBC, UA 0-2  <3 WBC/hpf   RBC / HPF 11-20  <3 RBC/hpf   Squamous Epithelial / LPF RARE  RARE  POCT I-STAT, CHEM 8     Status: Abnormal   Collection Time    07/11/12  4:45 PM      Result Value Range   Sodium 152 (*) 135 - 145 mEq/L   Potassium 2.1 (*) 3.5 - 5.1 mEq/L   Chloride 119 (*) 96 - 112 mEq/L   BUN 12  6 - 23 mg/dL   Creatinine, Ser 1.19  0.50 - 1.35 mg/dL   Glucose, Bld 147 (*) 70 - 99 mg/dL   Calcium, Ion 8.29 (*) 1.13 - 1.30 mmol/L   TCO2 15  0 - 100 mmol/L   Hemoglobin 8.2 (*)  13.0 - 17.0 g/dL   HCT 82.9 (*) 56.2 - 13.0 %   Comment NOTIFIED PHYSICIAN    CG4 I-STAT (LACTIC ACID)     Status: Abnormal   Collection Time    07/11/12  4:46 PM      Result Value Range   Lactic Acid, Venous 3.67 (*) 0.5 - 2.2 mmol/L  POCT I-STAT, CHEM 8     Status: Abnormal   Collection Time    07/11/12  5:56 PM      Result Value Range   Sodium 145  135 - 145 mEq/L   Potassium 3.2 (*) 3.5 - 5.1 mEq/L   Chloride 111  96 - 112 mEq/L   BUN 17  6 - 23 mg/dL   Creatinine, Ser 8.65  0.50 - 1.35 mg/dL   Glucose, Bld 784 (*) 70 - 99 mg/dL   Calcium, Ion 6.96  2.95 - 1.30 mmol/L   TCO2 22  0 - 100 mmol/L   Hemoglobin 10.9 (*) 13.0 - 17.0 g/dL   HCT 28.4 (*) 13.2 - 44.0 %   Dg Forearm Right  07/11/2012  *RADIOLOGY REPORT*  Clinical Data:   Fall, right arm pain.  Open fracture.  RIGHT FOREARM - 2 VIEW  Comparison: None.  Findings: Fracture is noted at the right elbow.  The distal humerus projects anteriorly relative to the radius and ulna. It is difficult to evaluate for fracture on these limited views of the elbow.  There is a comminuted distal right radial fracture.  Mild  impaction.  Displacement fracture fragments.  No visible distal ulnar fracture.  IMPRESSION: Dislocation at the right elbow.  It is difficult to exclude fracture of the elbow on these projections.  Consider dedicated elbow series.  Comminuted, displaced and impacted distal right radial fracture.   Original Report Authenticated By: Charlett Nose, M.D.    Ct Head Wo Contrast  07/11/2012  *RADIOLOGY REPORT*  Clinical Data:  15 feet fall from ladder  CT HEAD WITHOUT CONTRAST CT CERVICAL SPINE WITHOUT CONTRAST  Technique:  Multidetector CT imaging of the head and cervical spine was performed following the standard protocol without intravenous contrast.  Multiplanar CT image reconstructions of the cervical spine were also generated.  Comparison:  None.  CT HEAD  Findings: No evidence of parenchymal hemorrhage or extra-axial fluid collection. No mass lesion, mass effect, or midline shift.  No CT evidence of acute infarction.  Cerebral volume is age appropriate.  No ventriculomegaly.  The visualized paranasal sinuses are essentially clear. The mastoid air cells are unopacified.  No evidence of calvarial fracture.  IMPRESSION: No evidence of acute intracranial abnormality.  CT CERVICAL SPINE  Findings: Straightening of cervical spine.  No evidence of fracture or dislocation.  Vertebral body heights are maintained.  The dens appears intact.  No prevertebral soft tissue swelling.  Mild multilevel degenerative changes.  Visualized thyroid is unremarkable.  Visualized lung apices are clear.  IMPRESSION: No evidence of traumatic injury to the cervical spine.  Mild multilevel degenerative changes.   Original Report Authenticated By: Charline Bills, M.D.    Ct Chest W Contrast  07/11/2012  *RADIOLOGY REPORT*  Clinical Data:  15 feet fall from ladder  CT CHEST, ABDOMEN AND PELVIS WITH CONTRAST  Technique:  Multidetector CT imaging of the chest, abdomen and pelvis was performed following the standard protocol during bolus  administration of intravenous contrast.  Contrast:  100 ml Omnipaque-300 IV  Comparison:  None.  CT CHEST  Findings:  No evidence of acute  aortic injury.  No mediastinal hematoma.  Mild dependent atelectasis lung bases.  No suspicious pulmonary nodules.  No pleural effusion or pneumothorax.  Visualized thyroid is unremarkable.  The heart is normal in size.  No pericardial effusion.  No suspicious mediastinal, hilar, or axillary lymphadenopathy.  Mild degenerative changes of the thoracic spine.  No fracture is seen.  IMPRESSION: No evidence of traumatic injury to the chest.  CT ABDOMEN AND PELVIS  Findings:  Liver, spleen, pancreas, and adrenal glands are within normal limits.  Gallbladder is unremarkable.  No intrahepatic or extrahepatic ductal dilatation.  2 mm nonobstructing the left upper pole renal calculus (series 2/image 70).  3 mm nonobstructing left lower pole renal calculus (series 2/image 77).  Right kidney is within normal limits.  No hydronephrosis.  No evidence of bowel obstruction.  Normal appendix.  Atherosclerotic calcifications of the abdominal aorta and branch vessels.  No abdominopelvic ascites.  No free air.  Mild prostatomegaly, measuring 5.3 cm in transverse dimension, and notable for dystrophic calcifications.  Bladder is notable for indwelling Foley catheter.  Displaced fracture of the right iliac crest (series 2/image 101) extending to the superior and medial walls of the right acetabulum (series 2/images 117 and 125). Nondisplaced right inferior pubic ramus fracture (series 2/image 131).  Associated small volume right retroperitoneal/pelvic hemorrhage (series 2/image 125).  Mild degenerative changes of the lumbar spine.  IMPRESSION: Displaced fracture of the right iliac crest with associated comminuted acetabular fracture.  Nondisplaced right inferior pubic ramus fracture.  Associated small volume right retroperitoneal/pelvic hemorrhage.   Original Report Authenticated By: Charline Bills,  M.D.    Ct Cervical Spine Wo Contrast  07/11/2012  *RADIOLOGY REPORT*  Clinical Data:  15 feet fall from ladder  CT HEAD WITHOUT CONTRAST CT CERVICAL SPINE WITHOUT CONTRAST  Technique:  Multidetector CT imaging of the head and cervical spine was performed following the standard protocol without intravenous contrast.  Multiplanar CT image reconstructions of the cervical spine were also generated.  Comparison:  None.  CT HEAD  Findings: No evidence of parenchymal hemorrhage or extra-axial fluid collection. No mass lesion, mass effect, or midline shift.  No CT evidence of acute infarction.  Cerebral volume is age appropriate.  No ventriculomegaly.  The visualized paranasal sinuses are essentially clear. The mastoid air cells are unopacified.  No evidence of calvarial fracture.  IMPRESSION: No evidence of acute intracranial abnormality.  CT CERVICAL SPINE  Findings: Straightening of cervical spine.  No evidence of fracture or dislocation.  Vertebral body heights are maintained.  The dens appears intact.  No prevertebral soft tissue swelling.  Mild multilevel degenerative changes.  Visualized thyroid is unremarkable.  Visualized lung apices are clear.  IMPRESSION: No evidence of traumatic injury to the cervical spine.  Mild multilevel degenerative changes.   Original Report Authenticated By: Charline Bills, M.D.    Ct Abdomen Pelvis W Contrast  07/11/2012  *RADIOLOGY REPORT*  Clinical Data:  15 feet fall from ladder  CT CHEST, ABDOMEN AND PELVIS WITH CONTRAST  Technique:  Multidetector CT imaging of the chest, abdomen and pelvis was performed following the standard protocol during bolus administration of intravenous contrast.  Contrast:  100 ml Omnipaque-300 IV  Comparison:  None.  CT CHEST  Findings:  No evidence of acute aortic injury.  No mediastinal hematoma.  Mild dependent atelectasis lung bases.  No suspicious pulmonary nodules.  No pleural effusion or pneumothorax.  Visualized thyroid is unremarkable.  The  heart is normal in size.  No pericardial effusion.  No suspicious mediastinal, hilar, or axillary lymphadenopathy.  Mild degenerative changes of the thoracic spine.  No fracture is seen.  IMPRESSION: No evidence of traumatic injury to the chest.  CT ABDOMEN AND PELVIS  Findings:  Liver, spleen, pancreas, and adrenal glands are within normal limits.  Gallbladder is unremarkable.  No intrahepatic or extrahepatic ductal dilatation.  2 mm nonobstructing the left upper pole renal calculus (series 2/image 70).  3 mm nonobstructing left lower pole renal calculus (series 2/image 77).  Right kidney is within normal limits.  No hydronephrosis.  No evidence of bowel obstruction.  Normal appendix.  Atherosclerotic calcifications of the abdominal aorta and branch vessels.  No abdominopelvic ascites.  No free air.  Mild prostatomegaly, measuring 5.3 cm in transverse dimension, and notable for dystrophic calcifications.  Bladder is notable for indwelling Foley catheter.  Displaced fracture of the right iliac crest (series 2/image 101) extending to the superior and medial walls of the right acetabulum (series 2/images 117 and 125). Nondisplaced right inferior pubic ramus fracture (series 2/image 131).  Associated small volume right retroperitoneal/pelvic hemorrhage (series 2/image 125).  Mild degenerative changes of the lumbar spine.  IMPRESSION: Displaced fracture of the right iliac crest with associated comminuted acetabular fracture.  Nondisplaced right inferior pubic ramus fracture.  Associated small volume right retroperitoneal/pelvic hemorrhage.   Original Report Authenticated By: Charline Bills, M.D.    Dg Pelvis Portable  07/11/2012  *RADIOLOGY REPORT*  Clinical Data: Fall, right hip pain.  PORTABLE PELVIS  Comparison: None.  Findings: Cortical disruption and irregularity noted in the region of the right ischium and medial acetabulum compatible with acetabular fracture.  No proximal femoral fracture.  SI joints are  symmetric and unremarkable.  IMPRESSION: Right medial acetabular fracture.   Original Report Authenticated By: Charlett Nose, M.D.    Dg Chest Portable 1 View  07/11/2012  *RADIOLOGY REPORT*  Clinical Data: Trauma, fall  PORTABLE CHEST - 1 VIEW  Comparison: None.  Findings: Lungs are clear. No pleural effusion or pneumothorax.  Cardiomediastinal silhouette is within normal limits.  IMPRESSION: No evidence of acute cardiopulmonary disease.   Original Report Authenticated By: Charline Bills, M.D.     Review of Systems  Constitutional: Negative.   HENT: Negative.   Eyes: Negative.   Respiratory: Negative.   Cardiovascular: Negative.   Gastrointestinal: Negative.   Genitourinary:       Frequent kidney stones  Musculoskeletal:       See history of present illness, movement of left lower extremity makes right hip hurt  Skin:       See history of present illness  Neurological: Positive for sensory change.       Numbness right thumb and first finger  Endo/Heme/Allergies: Negative.     Blood pressure 111/54, pulse 93, temperature 97.5 F (36.4 C), temperature source Oral, resp. rate 20, SpO2 99.00%. Physical Exam  Constitutional: He is oriented to person, place, and time. He appears well-developed and well-nourished. No distress.  HENT:  Head: Normocephalic.  Right Ear: External ear normal.  Left Ear: External ear normal.  Nose: Nose normal.  Mouth/Throat: Oropharynx is clear and moist. No oropharyngeal exudate.  Eyes: Conjunctivae and EOM are normal. Pupils are equal, round, and reactive to light. Right eye exhibits no discharge. Left eye exhibits no discharge. No scleral icterus.  Neck: Normal range of motion. No tracheal deviation present. No thyromegaly present.  No posterior midline tenderness, no pain on active range of motion  Cardiovascular: Normal rate, regular rhythm, normal heart sounds  and intact distal pulses.   No murmur heard. Right radial pulse is faint but palpable,  right hand capillary refill 3 seconds, fingers warm, distal pulses left upper extremity and  bilateral lower extremities 2+  Respiratory: Effort normal and breath sounds normal. No stridor. No respiratory distress. He has no wheezes. He has no rales. He exhibits no tenderness.  GI: Soft. He exhibits no distension and no mass. There is no tenderness. There is no rebound and no guarding.  Alice sounds hypoactive, right inguinal scar from previous hernia repair  Genitourinary: Penis normal.  Foley catheter in place  Musculoskeletal:       Arms:      Legs: Deformity right elbow with palpable dislocation and linear 2.5 cm laceration posteriorly over joint, distal radius deformity, numbness right thumb and first finger, extension of all fingers is weak, Able to grip but limited due to pain  Tenderness on palpation right iliac crest  Neurological: He is alert and oriented to person, place, and time. He displays no tremor. He exhibits normal muscle tone. He displays no seizure activity. GCS eye subscore is 4. GCS verbal subscore is 5. GCS motor subscore is 6.  See above regarding RUE  Skin: Skin is warm. He is not diaphoretic.  Psychiatric: He has a normal mood and affect.     Assessment/Plan Status post fall with open right elbow dislocation, distal right radius fracture, right iliac wing fracture, right acetabular fracture, right inferior pubic ramus fracture. Will admit to step down unit on the trauma surgery service. Patient will be seen by Dr. Mina Marble from hand surgery and Dr. Shon Baton from orthopedic surgery. His cervical spine is cleared. He is cleared to go to the operating room with hand and orthopedics. I spoke at length with the patient, his wife, and his daughter.  Alexee Delsanto E 07/11/2012, 6:05 PM

## 2012-07-11 NOTE — ED Provider Notes (Signed)
History     CSN: [id number]  Arrival date & time 07/11/12  1555   First MD Initiated Contact with Patient 07/11/12 1608      No chief complaint on file.  HPI Patient is a 62 year old male who presents to the emergency department after a fall from a ladder. Patient was cutting tree limbs at approximately 15 feet and fell. Patient landed on right side. Patient only complaining of pain in her right forearm and right hip. No chest pain. No abdominal pain. No head pain. No neck pain. No left-sided pain. No loss of consciousness. No amnesia.takes aspirin but no other blood thinners. No other symptoms  Past Medical History  Diagnosis Date  . Bee sting allergy   . Hypercholesteremia   . Nephrolithiasis   . Herniated disc     c7-c7, c5-6, c3-4, c4-5  . Hypertension   . Back pain   . Obesity   . BPH (benign prostatic hypertrophy)   . Gastritis   . Reflux     Past Surgical History  Procedure Laterality Date  . Inguinal hernia repair      right  . Kidney stones      x2 Dr. Earlene Plater     Family History  Problem Relation Age of Onset  . Heart attack Mother   . Diabetes Mother     NIDDM  . Hypertension Mother   . Hypertension Father   . Stroke Father   . Nephrolithiasis Brother     History  Substance Use Topics  . Smoking status: Never Smoker   . Smokeless tobacco: Not on file  . Alcohol Use: No      Review of Systems  Constitutional: Negative for fever and chills.  HENT: Negative for congestion, sore throat and neck pain.   Respiratory: Negative for cough.   Gastrointestinal: Negative for nausea, vomiting, abdominal pain, diarrhea and constipation.  Endocrine: Negative for polyuria.  Genitourinary: Negative for dysuria and hematuria.  Skin: Negative for rash.  Neurological: Negative for headaches.  Psychiatric/Behavioral: Negative.   All other systems reviewed and are negative.    Allergies  Review of patient's allergies indicates not on file.  Home Medications    Current Outpatient Rx  Name  Route  Sig  Dispense  Refill  . aspirin (SB LOW DOSE ASA EC) 81 MG EC tablet   Oral   Take 81 mg by mouth daily.           . Choline Fenofibrate (TRILIPIX) 135 MG capsule   Oral   Take 135 mg by mouth daily.           . Choline Fenofibrate (TRILIPIX) 45 MG capsule   Oral   Take 45 mg by mouth daily.           Marland Kitchen dutasteride (AVODART) 0.5 MG capsule   Oral   Take 0.5 mg by mouth daily.           . metFORMIN (GLUCOPHAGE) 500 MG tablet   Oral   Take by mouth 2 (two) times daily with a meal.           . niacin (NIASPAN) 500 MG CR tablet   Oral   Take 500 mg by mouth at bedtime.           Marland Kitchen omeprazole (PRILOSEC) 20 MG capsule   Oral   Take 20 mg by mouth daily.           . Potassium 99 MG TABS   Oral  Take by mouth.           . quinapril (ACCUPRIL) 40 MG tablet   Oral   Take 40 mg by mouth at bedtime.           . simvastatin (ZOCOR) 40 MG tablet   Oral   Take 40 mg by mouth at bedtime.           . Tamsulosin HCl (FLOMAX) 0.4 MG CAPS   Oral   Take by mouth.           . valsartan-hydrochlorothiazide (DIOVAN-HCT) 160-25 MG per tablet   Oral   Take 1 tablet by mouth daily.           Marland Kitchen VITAMIN D, ERGOCALCIFEROL, PO   Oral   Take by mouth.             BP 116/61  Pulse 93  Temp(Src) 97.5 F (36.4 C) (Oral)  Resp 20  SpO2 100%  Physical Exam  Nursing note and vitals reviewed. Constitutional: He is oriented to person, place, and time. He appears well-developed and well-nourished. No distress.  HENT:  Head: Normocephalic and atraumatic.  Right Ear: External ear normal.  Left Ear: External ear normal.  Mouth/Throat: Oropharynx is clear and moist. No oropharyngeal exudate.  Eyes: Conjunctivae are normal. Pupils are equal, round, and reactive to light. Right eye exhibits no discharge.  Neck: Normal range of motion. Neck supple. No tracheal deviation present.  Cardiovascular: Normal rate, regular rhythm  and intact distal pulses.   Pulmonary/Chest: Effort normal. No respiratory distress. He has no wheezes. He has no rales.  Abdominal: Soft. He exhibits no distension. There is no tenderness. There is no rebound and no guarding.  Musculoskeletal:       Right hip: He exhibits decreased range of motion (2/2 pain).  R forearm.  Obvious deformity of R forearm.  Small open area over proximal forearm.  2+ radial pulse.  Median/ulnar motor intact.  Radial motor not functioning.  Neurological: He is alert and oriented to person, place, and time.  Skin: Skin is warm and dry. No rash noted. He is not diaphoretic.  Psychiatric: He has a normal mood and affect.    ED Course  Procedures (including critical care time)  Labs Reviewed  URINALYSIS, MICROSCOPIC ONLY - Abnormal; Notable for the following:    Glucose, UA 100 (*)    Hgb urine dipstick LARGE (*)    Leukocytes, UA TRACE (*)    All other components within normal limits  CBC - Abnormal; Notable for the following:    WBC 22.8 (*)    RBC 3.62 (*)    Hemoglobin 11.0 (*)    HCT 32.2 (*)    All other components within normal limits  COMPREHENSIVE METABOLIC PANEL - Abnormal; Notable for the following:    Potassium 2.9 (*)    Glucose, Bld 204 (*)    Albumin 3.2 (*)    Total Bilirubin 0.2 (*)    GFR calc non Af Amer 58 (*)    GFR calc Af Amer 68 (*)    All other components within normal limits  GLUCOSE, CAPILLARY - Abnormal; Notable for the following:    Glucose-Capillary 202 (*)    All other components within normal limits  POCT I-STAT, CHEM 8 - Abnormal; Notable for the following:    Sodium 152 (*)    Potassium 2.1 (*)    Chloride 119 (*)    Glucose, Bld 159 (*)    Calcium,  Ion 0.92 (*)    Hemoglobin 8.2 (*)    HCT 24.0 (*)    All other components within normal limits  CG4 I-STAT (LACTIC ACID) - Abnormal; Notable for the following:    Lactic Acid, Venous 3.67 (*)    All other components within normal limits  POCT I-STAT, CHEM 8 -  Abnormal; Notable for the following:    Potassium 3.2 (*)    Glucose, Bld 206 (*)    Hemoglobin 10.9 (*)    HCT 32.0 (*)    All other components within normal limits  POCT I-STAT 4, (NA,K, GLUC, HGB,HCT) - Abnormal; Notable for the following:    Sodium 146 (*)    Glucose, Bld 181 (*)    HCT 26.0 (*)    Hemoglobin 8.8 (*)    All other components within normal limits  PROTIME-INR  CDS SEROLOGY  URINALYSIS, MICROSCOPIC ONLY  HEMOGLOBIN AND HEMATOCRIT, BLOOD  TYPE AND SCREEN  ABO/RH  PREPARE RBC (CROSSMATCH)    Date: 07/11/2012  Rate: 80  Rhythm: normal sinus rhythm  QRS Axis: normal  Intervals: QT prolonged  ST/T Wave abnormalities: normal  Conduction Disutrbances:none  Narrative Interpretation: NSR.  QT prolongation.  No ischemic changes.  Old EKG Reviewed: unchanged    Dg Forearm Right  07/11/2012  *RADIOLOGY REPORT*  Clinical Data:   Fall, right arm pain.  Open fracture.  RIGHT FOREARM - 2 VIEW  Comparison: None.  Findings: Fracture is noted at the right elbow.  The distal humerus projects anteriorly relative to the radius and ulna. It is difficult to evaluate for fracture on these limited views of the elbow.  There is a comminuted distal right radial fracture.  Mild impaction.  Displacement fracture fragments.  No visible distal ulnar fracture.  IMPRESSION: Dislocation at the right elbow.  It is difficult to exclude fracture of the elbow on these projections.  Consider dedicated elbow series.  Comminuted, displaced and impacted distal right radial fracture.   Original Report Authenticated By: Charlett Nose, M.D.    Ct Head Wo Contrast  07/11/2012  *RADIOLOGY REPORT*  Clinical Data:  15 feet fall from ladder  CT HEAD WITHOUT CONTRAST CT CERVICAL SPINE WITHOUT CONTRAST  Technique:  Multidetector CT imaging of the head and cervical spine was performed following the standard protocol without intravenous contrast.  Multiplanar CT image reconstructions of the cervical spine were also  generated.  Comparison:  None.  CT HEAD  Findings: No evidence of parenchymal hemorrhage or extra-axial fluid collection. No mass lesion, mass effect, or midline shift.  No CT evidence of acute infarction.  Cerebral volume is age appropriate.  No ventriculomegaly.  The visualized paranasal sinuses are essentially clear. The mastoid air cells are unopacified.  No evidence of calvarial fracture.  IMPRESSION: No evidence of acute intracranial abnormality.  CT CERVICAL SPINE  Findings: Straightening of cervical spine.  No evidence of fracture or dislocation.  Vertebral body heights are maintained.  The dens appears intact.  No prevertebral soft tissue swelling.  Mild multilevel degenerative changes.  Visualized thyroid is unremarkable.  Visualized lung apices are clear.  IMPRESSION: No evidence of traumatic injury to the cervical spine.  Mild multilevel degenerative changes.   Original Report Authenticated By: Charline Bills, M.D.    Ct Chest W Contrast  07/11/2012  *RADIOLOGY REPORT*  Clinical Data:  15 feet fall from ladder  CT CHEST, ABDOMEN AND PELVIS WITH CONTRAST  Technique:  Multidetector CT imaging of the chest, abdomen and pelvis was performed following  the standard protocol during bolus administration of intravenous contrast.  Contrast:  100 ml Omnipaque-300 IV  Comparison:  None.  CT CHEST  Findings:  No evidence of acute aortic injury.  No mediastinal hematoma.  Mild dependent atelectasis lung bases.  No suspicious pulmonary nodules.  No pleural effusion or pneumothorax.  Visualized thyroid is unremarkable.  The heart is normal in size.  No pericardial effusion.  No suspicious mediastinal, hilar, or axillary lymphadenopathy.  Mild degenerative changes of the thoracic spine.  No fracture is seen.  IMPRESSION: No evidence of traumatic injury to the chest.  CT ABDOMEN AND PELVIS  Findings:  Liver, spleen, pancreas, and adrenal glands are within normal limits.  Gallbladder is unremarkable.  No intrahepatic  or extrahepatic ductal dilatation.  2 mm nonobstructing the left upper pole renal calculus (series 2/image 70).  3 mm nonobstructing left lower pole renal calculus (series 2/image 77).  Right kidney is within normal limits.  No hydronephrosis.  No evidence of bowel obstruction.  Normal appendix.  Atherosclerotic calcifications of the abdominal aorta and branch vessels.  No abdominopelvic ascites.  No free air.  Mild prostatomegaly, measuring 5.3 cm in transverse dimension, and notable for dystrophic calcifications.  Bladder is notable for indwelling Foley catheter.  Displaced fracture of the right iliac crest (series 2/image 101) extending to the superior and medial walls of the right acetabulum (series 2/images 117 and 125). Nondisplaced right inferior pubic ramus fracture (series 2/image 131).  Associated small volume right retroperitoneal/pelvic hemorrhage (series 2/image 125).  Mild degenerative changes of the lumbar spine.  IMPRESSION: Displaced fracture of the right iliac crest with associated comminuted acetabular fracture.  Nondisplaced right inferior pubic ramus fracture.  Associated small volume right retroperitoneal/pelvic hemorrhage.   Original Report Authenticated By: Charline Bills, M.D.    Ct Cervical Spine Wo Contrast  07/11/2012  *RADIOLOGY REPORT*  Clinical Data:  15 feet fall from ladder  CT HEAD WITHOUT CONTRAST CT CERVICAL SPINE WITHOUT CONTRAST  Technique:  Multidetector CT imaging of the head and cervical spine was performed following the standard protocol without intravenous contrast.  Multiplanar CT image reconstructions of the cervical spine were also generated.  Comparison:  None.  CT HEAD  Findings: No evidence of parenchymal hemorrhage or extra-axial fluid collection. No mass lesion, mass effect, or midline shift.  No CT evidence of acute infarction.  Cerebral volume is age appropriate.  No ventriculomegaly.  The visualized paranasal sinuses are essentially clear. The mastoid air  cells are unopacified.  No evidence of calvarial fracture.  IMPRESSION: No evidence of acute intracranial abnormality.  CT CERVICAL SPINE  Findings: Straightening of cervical spine.  No evidence of fracture or dislocation.  Vertebral body heights are maintained.  The dens appears intact.  No prevertebral soft tissue swelling.  Mild multilevel degenerative changes.  Visualized thyroid is unremarkable.  Visualized lung apices are clear.  IMPRESSION: No evidence of traumatic injury to the cervical spine.  Mild multilevel degenerative changes.   Original Report Authenticated By: Charline Bills, M.D.    Ct Abdomen Pelvis W Contrast  07/11/2012  *RADIOLOGY REPORT*  Clinical Data:  15 feet fall from ladder  CT CHEST, ABDOMEN AND PELVIS WITH CONTRAST  Technique:  Multidetector CT imaging of the chest, abdomen and pelvis was performed following the standard protocol during bolus administration of intravenous contrast.  Contrast:  100 ml Omnipaque-300 IV  Comparison:  None.  CT CHEST  Findings:  No evidence of acute aortic injury.  No mediastinal hematoma.  Mild dependent  atelectasis lung bases.  No suspicious pulmonary nodules.  No pleural effusion or pneumothorax.  Visualized thyroid is unremarkable.  The heart is normal in size.  No pericardial effusion.  No suspicious mediastinal, hilar, or axillary lymphadenopathy.  Mild degenerative changes of the thoracic spine.  No fracture is seen.  IMPRESSION: No evidence of traumatic injury to the chest.  CT ABDOMEN AND PELVIS  Findings:  Liver, spleen, pancreas, and adrenal glands are within normal limits.  Gallbladder is unremarkable.  No intrahepatic or extrahepatic ductal dilatation.  2 mm nonobstructing the left upper pole renal calculus (series 2/image 70).  3 mm nonobstructing left lower pole renal calculus (series 2/image 77).  Right kidney is within normal limits.  No hydronephrosis.  No evidence of bowel obstruction.  Normal appendix.  Atherosclerotic calcifications  of the abdominal aorta and branch vessels.  No abdominopelvic ascites.  No free air.  Mild prostatomegaly, measuring 5.3 cm in transverse dimension, and notable for dystrophic calcifications.  Bladder is notable for indwelling Foley catheter.  Displaced fracture of the right iliac crest (series 2/image 101) extending to the superior and medial walls of the right acetabulum (series 2/images 117 and 125). Nondisplaced right inferior pubic ramus fracture (series 2/image 131).  Associated small volume right retroperitoneal/pelvic hemorrhage (series 2/image 125).  Mild degenerative changes of the lumbar spine.  IMPRESSION: Displaced fracture of the right iliac crest with associated comminuted acetabular fracture.  Nondisplaced right inferior pubic ramus fracture.  Associated small volume right retroperitoneal/pelvic hemorrhage.   Original Report Authenticated By: Charline Bills, M.D.    Dg Pelvis Portable  07/11/2012  *RADIOLOGY REPORT*  Clinical Data: Fall, right hip pain.  PORTABLE PELVIS  Comparison: None.  Findings: Cortical disruption and irregularity noted in the region of the right ischium and medial acetabulum compatible with acetabular fracture.  No proximal femoral fracture.  SI joints are symmetric and unremarkable.  IMPRESSION: Right medial acetabular fracture.   Original Report Authenticated By: Charlett Nose, M.D.    Dg Chest Portable 1 View  07/11/2012  *RADIOLOGY REPORT*  Clinical Data: Trauma, fall  PORTABLE CHEST - 1 VIEW  Comparison: None.  Findings: Lungs are clear. No pleural effusion or pneumothorax.  Cardiomediastinal silhouette is within normal limits.  IMPRESSION: No evidence of acute cardiopulmonary disease.   Original Report Authenticated By: Charline Bills, M.D.      1. Pelvic fracture, closed, initial encounter   2. Acetabular fracture, right, closed, initial encounter   3. Fracture dislocation of elbow joint, right, open, initial encounter   4. Trauma   5. Anemia       MDM    Patient is a 62 year old male who presented to the emergency department as a level II trauma after falling off a ladder onto his right side. Primary survey completely without abnormalities. Secondary survey revealing open right forearm fracture and probable hip/acetabular fracture. Given the mechanism trauma labs and trauma imaging initiated. Initial hemoglobin concerning for anemia. Presumed to be secondary to blood loss. As a result but trauma was upgraded to a level I. After imaging done was found the patient with acetabular fracture and open right elbow/forearm fractures and dislocation with radial nerve injury. Orthopedics consult is for pelvic fracture. Hand team consulted for forearm fractures. Trauma to admit. Patient admitted     Arloa Koh, MD 07/12/12 [phone]

## 2012-07-11 NOTE — Anesthesia Preprocedure Evaluation (Signed)
Anesthesia Evaluation  Patient identified by MRN, date of birth, ID band Patient awake    Reviewed: Allergy & Precautions, H&P , NPO status , Patient's Chart, lab work & pertinent test results  Airway Mallampati: II TM Distance: >3 FB Neck ROM: Full    Dental   Pulmonary  breath sounds clear to auscultation        Cardiovascular hypertension, Rhythm:Regular Rate:Tachycardia     Neuro/Psych    GI/Hepatic   Endo/Other  diabetes  Renal/GU      Musculoskeletal   Abdominal (+) + obese,   Peds  Hematology   Anesthesia Other Findings Pelvic fx  Reproductive/Obstetrics                           Anesthesia Physical Anesthesia Plan  ASA: III and emergent  Anesthesia Plan: General   Post-op Pain Management:    Induction: Rapid sequence, Cricoid pressure planned and Intravenous  Airway Management Planned: Oral ETT  Additional Equipment:   Intra-op Plan:   Post-operative Plan: Extubation in OR  Informed Consent: I have reviewed the patients History and Physical, chart, labs and discussed the procedure including the risks, benefits and alternatives for the proposed anesthesia with the patient or authorized representative who has indicated his/her understanding and acceptance.     Plan Discussed with: CRNA and Surgeon  Anesthesia Plan Comments:         Anesthesia Quick Evaluation

## 2012-07-11 NOTE — Progress Notes (Signed)
Orthopedic Tech Progress Note Patient Details:  Roger Palmer 29-May-1950 [id number]  Musculoskeletal Traction Type of Traction: Benita Gutter Traction Location: right leg Traction Weight: 15 lbs As ordered by Dr Ellis Savage, Damonte Frieson 07/11/2012, 11:38 PM

## 2012-07-11 NOTE — Anesthesia Procedure Notes (Signed)
Procedure Name: Intubation Date/Time: 07/11/2012 7:06 PM Performed by: Julianne Rice Z Pre-anesthesia Checklist: Patient identified, Timeout performed, Emergency Drugs available, Suction available and Patient being monitored Patient Re-evaluated:Patient Re-evaluated prior to inductionOxygen Delivery Method: Circle system utilized Preoxygenation: Pre-oxygenation with 100% oxygen Intubation Type: IV induction, Rapid sequence and Cricoid Pressure applied Laryngoscope Size: Mac and 3 Grade View: Grade I Tube type: Oral Tube size: 7.5 mm Number of attempts: 1 Airway Equipment and Method: Stylet Placement Confirmation: ETT inserted through vocal cords under direct vision,  breath sounds checked- equal and bilateral and positive ETCO2 Secured at: 23 cm Tube secured with: Tape Dental Injury: Teeth and Oropharynx as per pre-operative assessment

## 2012-07-11 NOTE — Consult Note (Signed)
Reason for Consult:right elbow and wrist pain Referring Physician: beaton  [name] is an 62 y.o. male.  HPI: s/p fall with cc of right upper extremity pain and defromity  Past Medical History  Diagnosis Date  . Bee sting allergy   . Hypercholesteremia   . Nephrolithiasis   . Herniated disc     c7-c7, c5-6, c3-4, c4-5  . Hypertension   . Back pain   . Obesity   . BPH (benign prostatic hypertrophy)   . Gastritis   . Reflux   . Diabetes mellitus without complication     Past Surgical History  Procedure Laterality Date  . Inguinal hernia repair      right  . Kidney stones      x2 Dr. Earlene Plater     Family History  Problem Relation Age of Onset  . Heart attack Mother   . Diabetes Mother     NIDDM  . Hypertension Mother   . Hypertension Father   . Stroke Father   . Nephrolithiasis Brother     Social History:  reports that he has never smoked. He does not have any smokeless tobacco history on file. He reports that he does not drink alcohol or use illicit drugs.  Allergies: No Known Allergies  Medications: Prior to Admission:  (Not in a hospital admission)  Results for orders placed during the hospital encounter of 07/11/12 (from the past 48 hour(s))  URINALYSIS, MICROSCOPIC ONLY     Status: Abnormal   Collection Time    07/11/12  4:24 PM      Result Value Range   Color, Urine YELLOW  YELLOW   APPearance CLEAR  CLEAR   Specific Gravity, Urine 1.019  1.005 - 1.030   pH 5.5  5.0 - 8.0   Glucose, UA 100 (*) NEGATIVE mg/dL   Hgb urine dipstick LARGE (*) NEGATIVE   Bilirubin Urine NEGATIVE  NEGATIVE   Ketones, ur NEGATIVE  NEGATIVE mg/dL   Protein, ur NEGATIVE  NEGATIVE mg/dL   Urobilinogen, UA 0.2  0.0 - 1.0 mg/dL   Nitrite NEGATIVE  NEGATIVE   Leukocytes, UA TRACE (*) NEGATIVE   WBC, UA 0-2  <3 WBC/hpf   RBC / HPF 11-20  <3 RBC/hpf   Squamous Epithelial / LPF RARE  RARE  POCT I-STAT, CHEM 8     Status: Abnormal   Collection Time    07/11/12  4:45 PM       Result Value Range   Sodium 152 (*) 135 - 145 mEq/L   Potassium 2.1 (*) 3.5 - 5.1 mEq/L   Chloride 119 (*) 96 - 112 mEq/L   BUN 12  6 - 23 mg/dL   Creatinine, Ser 5.63  0.50 - 1.35 mg/dL   Glucose, Bld 875 (*) 70 - 99 mg/dL   Calcium, Ion 6.43 (*) 1.13 - 1.30 mmol/L   TCO2 15  0 - 100 mmol/L   Hemoglobin 8.2 (*) 13.0 - 17.0 g/dL   HCT 32.9 (*) 51.8 - 84.1 %   Comment NOTIFIED PHYSICIAN    CG4 I-STAT (LACTIC ACID)     Status: Abnormal   Collection Time    07/11/12  4:46 PM      Result Value Range   Lactic Acid, Venous 3.67 (*) 0.5 - 2.2 mmol/L  CBC     Status: Abnormal   Collection Time    07/11/12  5:28 PM      Result Value Range   WBC 22.8 (*) 4.0 -  10.5 K/uL   RBC 3.62 (*) 4.22 - 5.81 MIL/uL   Hemoglobin 11.0 (*) 13.0 - 17.0 g/dL   Comment: DELTA CHECK NOTED   HCT 32.2 (*) 39.0 - 52.0 %   MCV 89.0  78.0 - 100.0 fL   MCH 30.4  26.0 - 34.0 pg   MCHC 34.2  30.0 - 36.0 g/dL   RDW 45.4  09.8 - 11.9 %   Platelets 221  150 - 400 K/uL  POCT I-STAT, CHEM 8     Status: Abnormal   Collection Time    07/11/12  5:56 PM      Result Value Range   Sodium 145  135 - 145 mEq/L   Potassium 3.2 (*) 3.5 - 5.1 mEq/L   Chloride 111  96 - 112 mEq/L   BUN 17  6 - 23 mg/dL   Creatinine, Ser 1.47  0.50 - 1.35 mg/dL   Glucose, Bld 829 (*) 70 - 99 mg/dL   Calcium, Ion 5.62  1.30 - 1.30 mmol/L   TCO2 22  0 - 100 mmol/L   Hemoglobin 10.9 (*) 13.0 - 17.0 g/dL   HCT 86.5 (*) 78.4 - 69.6 %    Dg Forearm Right  07/11/2012  *RADIOLOGY REPORT*  Clinical Data:   Fall, right arm pain.  Open fracture.  RIGHT FOREARM - 2 VIEW  Comparison: None.  Findings: Fracture is noted at the right elbow.  The distal humerus projects anteriorly relative to the radius and ulna. It is difficult to evaluate for fracture on these limited views of the elbow.  There is a comminuted distal right radial fracture.  Mild impaction.  Displacement fracture fragments.  No visible distal ulnar fracture.  IMPRESSION: Dislocation at the  right elbow.  It is difficult to exclude fracture of the elbow on these projections.  Consider dedicated elbow series.  Comminuted, displaced and impacted distal right radial fracture.   Original Report Authenticated By: Charlett Nose, M.D.    Ct Head Wo Contrast  07/11/2012  *RADIOLOGY REPORT*  Clinical Data:  15 feet fall from ladder  CT HEAD WITHOUT CONTRAST CT CERVICAL SPINE WITHOUT CONTRAST  Technique:  Multidetector CT imaging of the head and cervical spine was performed following the standard protocol without intravenous contrast.  Multiplanar CT image reconstructions of the cervical spine were also generated.  Comparison:  None.  CT HEAD  Findings: No evidence of parenchymal hemorrhage or extra-axial fluid collection. No mass lesion, mass effect, or midline shift.  No CT evidence of acute infarction.  Cerebral volume is age appropriate.  No ventriculomegaly.  The visualized paranasal sinuses are essentially clear. The mastoid air cells are unopacified.  No evidence of calvarial fracture.  IMPRESSION: No evidence of acute intracranial abnormality.  CT CERVICAL SPINE  Findings: Straightening of cervical spine.  No evidence of fracture or dislocation.  Vertebral body heights are maintained.  The dens appears intact.  No prevertebral soft tissue swelling.  Mild multilevel degenerative changes.  Visualized thyroid is unremarkable.  Visualized lung apices are clear.  IMPRESSION: No evidence of traumatic injury to the cervical spine.  Mild multilevel degenerative changes.   Original Report Authenticated By: Charline Bills, M.D.    Ct Chest W Contrast  07/11/2012  *RADIOLOGY REPORT*  Clinical Data:  15 feet fall from ladder  CT CHEST, ABDOMEN AND PELVIS WITH CONTRAST  Technique:  Multidetector CT imaging of the chest, abdomen and pelvis was performed following the standard protocol during bolus administration of intravenous contrast.  Contrast:  100 ml Omnipaque-300 IV  Comparison:  None.  CT CHEST  Findings:   No evidence of acute aortic injury.  No mediastinal hematoma.  Mild dependent atelectasis lung bases.  No suspicious pulmonary nodules.  No pleural effusion or pneumothorax.  Visualized thyroid is unremarkable.  The heart is normal in size.  No pericardial effusion.  No suspicious mediastinal, hilar, or axillary lymphadenopathy.  Mild degenerative changes of the thoracic spine.  No fracture is seen.  IMPRESSION: No evidence of traumatic injury to the chest.  CT ABDOMEN AND PELVIS  Findings:  Liver, spleen, pancreas, and adrenal glands are within normal limits.  Gallbladder is unremarkable.  No intrahepatic or extrahepatic ductal dilatation.  2 mm nonobstructing the left upper pole renal calculus (series 2/image 70).  3 mm nonobstructing left lower pole renal calculus (series 2/image 77).  Right kidney is within normal limits.  No hydronephrosis.  No evidence of bowel obstruction.  Normal appendix.  Atherosclerotic calcifications of the abdominal aorta and branch vessels.  No abdominopelvic ascites.  No free air.  Mild prostatomegaly, measuring 5.3 cm in transverse dimension, and notable for dystrophic calcifications.  Bladder is notable for indwelling Foley catheter.  Displaced fracture of the right iliac crest (series 2/image 101) extending to the superior and medial walls of the right acetabulum (series 2/images 117 and 125). Nondisplaced right inferior pubic ramus fracture (series 2/image 131).  Associated small volume right retroperitoneal/pelvic hemorrhage (series 2/image 125).  Mild degenerative changes of the lumbar spine.  IMPRESSION: Displaced fracture of the right iliac crest with associated comminuted acetabular fracture.  Nondisplaced right inferior pubic ramus fracture.  Associated small volume right retroperitoneal/pelvic hemorrhage.   Original Report Authenticated By: Charline Bills, M.D.    Ct Cervical Spine Wo Contrast  07/11/2012  *RADIOLOGY REPORT*  Clinical Data:  15 feet fall from ladder  CT  HEAD WITHOUT CONTRAST CT CERVICAL SPINE WITHOUT CONTRAST  Technique:  Multidetector CT imaging of the head and cervical spine was performed following the standard protocol without intravenous contrast.  Multiplanar CT image reconstructions of the cervical spine were also generated.  Comparison:  None.  CT HEAD  Findings: No evidence of parenchymal hemorrhage or extra-axial fluid collection. No mass lesion, mass effect, or midline shift.  No CT evidence of acute infarction.  Cerebral volume is age appropriate.  No ventriculomegaly.  The visualized paranasal sinuses are essentially clear. The mastoid air cells are unopacified.  No evidence of calvarial fracture.  IMPRESSION: No evidence of acute intracranial abnormality.  CT CERVICAL SPINE  Findings: Straightening of cervical spine.  No evidence of fracture or dislocation.  Vertebral body heights are maintained.  The dens appears intact.  No prevertebral soft tissue swelling.  Mild multilevel degenerative changes.  Visualized thyroid is unremarkable.  Visualized lung apices are clear.  IMPRESSION: No evidence of traumatic injury to the cervical spine.  Mild multilevel degenerative changes.   Original Report Authenticated By: Charline Bills, M.D.    Ct Abdomen Pelvis W Contrast  07/11/2012  *RADIOLOGY REPORT*  Clinical Data:  15 feet fall from ladder  CT CHEST, ABDOMEN AND PELVIS WITH CONTRAST  Technique:  Multidetector CT imaging of the chest, abdomen and pelvis was performed following the standard protocol during bolus administration of intravenous contrast.  Contrast:  100 ml Omnipaque-300 IV  Comparison:  None.  CT CHEST  Findings:  No evidence of acute aortic injury.  No mediastinal hematoma.  Mild dependent atelectasis lung bases.  No suspicious pulmonary nodules.  No pleural effusion  or pneumothorax.  Visualized thyroid is unremarkable.  The heart is normal in size.  No pericardial effusion.  No suspicious mediastinal, hilar, or axillary lymphadenopathy.   Mild degenerative changes of the thoracic spine.  No fracture is seen.  IMPRESSION: No evidence of traumatic injury to the chest.  CT ABDOMEN AND PELVIS  Findings:  Liver, spleen, pancreas, and adrenal glands are within normal limits.  Gallbladder is unremarkable.  No intrahepatic or extrahepatic ductal dilatation.  2 mm nonobstructing the left upper pole renal calculus (series 2/image 70).  3 mm nonobstructing left lower pole renal calculus (series 2/image 77).  Right kidney is within normal limits.  No hydronephrosis.  No evidence of bowel obstruction.  Normal appendix.  Atherosclerotic calcifications of the abdominal aorta and branch vessels.  No abdominopelvic ascites.  No free air.  Mild prostatomegaly, measuring 5.3 cm in transverse dimension, and notable for dystrophic calcifications.  Bladder is notable for indwelling Foley catheter.  Displaced fracture of the right iliac crest (series 2/image 101) extending to the superior and medial walls of the right acetabulum (series 2/images 117 and 125). Nondisplaced right inferior pubic ramus fracture (series 2/image 131).  Associated small volume right retroperitoneal/pelvic hemorrhage (series 2/image 125).  Mild degenerative changes of the lumbar spine.  IMPRESSION: Displaced fracture of the right iliac crest with associated comminuted acetabular fracture.  Nondisplaced right inferior pubic ramus fracture.  Associated small volume right retroperitoneal/pelvic hemorrhage.   Original Report Authenticated By: Charline Bills, M.D.    Dg Pelvis Portable  07/11/2012  *RADIOLOGY REPORT*  Clinical Data: Fall, right hip pain.  PORTABLE PELVIS  Comparison: None.  Findings: Cortical disruption and irregularity noted in the region of the right ischium and medial acetabulum compatible with acetabular fracture.  No proximal femoral fracture.  SI joints are symmetric and unremarkable.  IMPRESSION: Right medial acetabular fracture.   Original Report Authenticated By: Charlett Nose, M.D.    Dg Chest Portable 1 View  07/11/2012  *RADIOLOGY REPORT*  Clinical Data: Trauma, fall  PORTABLE CHEST - 1 VIEW  Comparison: None.  Findings: Lungs are clear. No pleural effusion or pneumothorax.  Cardiomediastinal silhouette is within normal limits.  IMPRESSION: No evidence of acute cardiopulmonary disease.   Original Report Authenticated By: Charline Bills, M.D.     Review of Systems  All other systems reviewed and are negative.   Blood pressure 127/71, pulse 110, temperature 97.5 F (36.4 C), temperature source Oral, resp. rate 11, SpO2 98.00%. Physical Exam  Constitutional: He is oriented to person, place, and time. He appears well-developed.  HENT:  Head: Normocephalic and atraumatic.  Cardiovascular: Normal rate.   Respiratory: Effort normal.  Musculoskeletal:       Right elbow: He exhibits deformity and laceration.       Right wrist: He exhibits tenderness, bony tenderness and deformity.  Neurological: He is alert and oriented to person, place, and time.  Skin: Skin is warm.  Psychiatric: He has a normal mood and affect. His behavior is normal. Judgment and thought content normal.    Assessment/Plan: As above  Plan ORIF right distal radius and closed vs open tx right elbow  Misk Galentine A 07/11/2012, 6:25 PM

## 2012-07-11 NOTE — ED Notes (Signed)
Transported to CT with RN  

## 2012-07-11 NOTE — Procedures (Signed)
Full note dictated Job no. [id number]

## 2012-07-12 ENCOUNTER — Encounter (HOSPITAL_COMMUNITY): Payer: Self-pay | Admitting: *Deleted

## 2012-07-12 DIAGNOSIS — D62 Acute posthemorrhagic anemia: Secondary | ICD-10-CM

## 2012-07-12 HISTORY — PX: OTHER SURGICAL HISTORY: SHX169

## 2012-07-12 LAB — GLUCOSE, CAPILLARY
Glucose-Capillary: 177 mg/dL — ABNORMAL HIGH (ref 70–99)
Glucose-Capillary: 238 mg/dL — ABNORMAL HIGH (ref 70–99)

## 2012-07-12 LAB — TYPE AND SCREEN
Unit division: 0
Unit division: 0
Unit division: 0

## 2012-07-12 LAB — CBC
Hemoglobin: 10.8 g/dL — ABNORMAL LOW (ref 13.0–17.0)
MCH: 30.7 pg (ref 26.0–34.0)
MCHC: 34.6 g/dL (ref 30.0–36.0)
MCV: 88.6 fL (ref 78.0–100.0)

## 2012-07-12 LAB — BASIC METABOLIC PANEL
BUN: 15 mg/dL (ref 6–23)
Calcium: 8.4 mg/dL (ref 8.4–10.5)
Creatinine, Ser: 1.14 mg/dL (ref 0.50–1.35)
GFR calc non Af Amer: 67 mL/min — ABNORMAL LOW (ref >90)
Glucose, Bld: 201 mg/dL — ABNORMAL HIGH (ref 70–99)
Sodium: 143 mEq/L (ref 135–145)

## 2012-07-12 LAB — HEMOGLOBIN AND HEMATOCRIT, BLOOD: Hemoglobin: 10.8 g/dL — ABNORMAL LOW (ref 13.0–17.0)

## 2012-07-12 MED ORDER — INSULIN ASPART 100 UNIT/ML ~~LOC~~ SOLN
0.0000 [IU] | SUBCUTANEOUS | Status: DC
Start: 2012-07-12 — End: 2012-07-18
  Administered 2012-07-12 (×3): 3 [IU] via SUBCUTANEOUS
  Administered 2012-07-12: 5 [IU] via SUBCUTANEOUS
  Administered 2012-07-12 – 2012-07-13 (×3): 3 [IU] via SUBCUTANEOUS
  Administered 2012-07-13: 5 [IU] via SUBCUTANEOUS
  Administered 2012-07-13: 3 [IU] via SUBCUTANEOUS
  Administered 2012-07-13 – 2012-07-14 (×3): 5 [IU] via SUBCUTANEOUS
  Administered 2012-07-14: 3 [IU] via SUBCUTANEOUS
  Administered 2012-07-14: 5 [IU] via SUBCUTANEOUS
  Administered 2012-07-14: 3 [IU] via SUBCUTANEOUS
  Administered 2012-07-15: 5 [IU] via SUBCUTANEOUS
  Administered 2012-07-15: 3 [IU] via SUBCUTANEOUS
  Administered 2012-07-15: 5 [IU] via SUBCUTANEOUS
  Administered 2012-07-15: 2 [IU] via SUBCUTANEOUS
  Administered 2012-07-15 – 2012-07-16 (×2): 3 [IU] via SUBCUTANEOUS
  Administered 2012-07-16: 2 [IU] via SUBCUTANEOUS
  Administered 2012-07-16: 04:00:00 via SUBCUTANEOUS
  Administered 2012-07-16 (×3): 2 [IU] via SUBCUTANEOUS
  Administered 2012-07-17: 3 [IU] via SUBCUTANEOUS
  Administered 2012-07-17: 2 [IU] via SUBCUTANEOUS
  Administered 2012-07-17 – 2012-07-18 (×6): 3 [IU] via SUBCUTANEOUS
  Administered 2012-07-18: 5 [IU] via SUBCUTANEOUS

## 2012-07-12 MED ORDER — PANTOPRAZOLE SODIUM 40 MG IV SOLR
40.0000 mg | Freq: Every day | INTRAVENOUS | Status: DC
  Administered 2012-07-14 – 2012-07-20 (×5): 40 mg via INTRAVENOUS
  Filled 2012-07-12 (×13): qty 40

## 2012-07-12 MED ORDER — VALSARTAN-HYDROCHLOROTHIAZIDE 160-25 MG PO TABS: 1.0000 | ORAL_TABLET | Freq: Every day | ORAL | Status: DC

## 2012-07-12 MED ORDER — ONDANSETRON HCL 4 MG/2ML IJ SOLN
4.0000 mg | Freq: Four times a day (QID) | INTRAMUSCULAR | Status: DC | PRN
  Administered 2012-07-14 – 2012-07-20 (×2): 4 mg via INTRAVENOUS
  Filled 2012-07-12 (×4): qty 2

## 2012-07-12 MED ORDER — PANTOPRAZOLE SODIUM 40 MG PO TBEC
40.0000 mg | DELAYED_RELEASE_TABLET | Freq: Every day | ORAL | Status: DC
  Administered 2012-07-12 – 2012-07-23 (×7): 40 mg via ORAL
  Filled 2012-07-12 (×7): qty 1

## 2012-07-12 MED ORDER — ENOXAPARIN SODIUM 30 MG/0.3ML ~~LOC~~ SOLN
30.0000 mg | Freq: Two times a day (BID) | SUBCUTANEOUS | Status: DC
  Administered 2012-07-12 (×2): 30 mg via SUBCUTANEOUS
  Filled 2012-07-12 (×4): qty 0.3

## 2012-07-12 MED ORDER — LISINOPRIL 40 MG PO TABS
40.0000 mg | ORAL_TABLET | Freq: Every day | ORAL | Status: DC
  Administered 2012-07-12 – 2012-07-13 (×4): 40 mg via ORAL
  Filled 2012-07-12 (×5): qty 1

## 2012-07-12 MED ORDER — ONDANSETRON HCL 4 MG PO TABS: 4.0000 mg | ORAL_TABLET | Freq: Four times a day (QID) | ORAL | Status: DC | PRN

## 2012-07-12 MED ORDER — IRBESARTAN 150 MG PO TABS
150.0000 mg | ORAL_TABLET | Freq: Every day | ORAL | Status: DC
  Administered 2012-07-12 – 2012-07-13 (×2): 150 mg via ORAL
  Filled 2012-07-12 (×4): qty 1

## 2012-07-12 MED ORDER — HYDROCHLOROTHIAZIDE 25 MG PO TABS
25.0000 mg | ORAL_TABLET | Freq: Every day | ORAL | Status: DC
  Administered 2012-07-12 – 2012-07-13 (×2): 25 mg via ORAL
  Filled 2012-07-12 (×6): qty 1

## 2012-07-12 MED ORDER — MIDAZOLAM HCL 2 MG/2ML IJ SOLN: 1.0000 mg | INTRAMUSCULAR | Status: DC | PRN

## 2012-07-12 MED ORDER — FENTANYL CITRATE 0.05 MG/ML IJ SOLN: 50.0000 ug | Freq: Once | INTRAMUSCULAR | Status: DC

## 2012-07-12 MED ORDER — DUTASTERIDE 0.5 MG PO CAPS
0.5000 mg | ORAL_CAPSULE | Freq: Every day | ORAL | Status: DC
  Administered 2012-07-12 – 2012-07-15 (×3): 0.5 mg via ORAL
  Filled 2012-07-12 (×4): qty 1

## 2012-07-12 MED ORDER — TAMSULOSIN HCL 0.4 MG PO CAPS
0.4000 mg | ORAL_CAPSULE | Freq: Every day | ORAL | Status: DC
  Administered 2012-07-12 – 2012-07-23 (×11): 0.4 mg via ORAL
  Filled 2012-07-12 (×12): qty 1

## 2012-07-12 NOTE — Op Note (Signed)
NAME:  LEEMAN, JOHNSEY NO.:  [id number]  MEDICAL RECORD NO.:  [id number]  LOCATION:  3312                         FACILITY:  [facility]  PHYSICIAN:  Artist Pais. Lavelle Berland, M.D.DATE OF BIRTH:  [birth year]  DATE OF PROCEDURE:  07/12/2012 DATE OF DISCHARGE:                              OPERATIVE REPORT   PREOPERATIVE DIAGNOSIS:  Open right elbow fracture dislocation and intra- articular complex fracture dislocation, right distal radius.  POSTOPERATIVE DIAGNOSIS:  Open right elbow fracture dislocation and intra-articular complex fracture dislocation, right distal radius.  PROCEDURE:  Open reduction and internal fixation, displaced intra- articular fracture dislocation right wrist, and open treatment of fracture dislocation, right elbow with radial head replacement, and reconstruction lateral side.  SURGEON:  Artist Pais. Mina Marble, M.D.  ASSISTANT:  None.  ANESTHESIA:  General.  COMPLICATIONS:  None.  DRAINS:  None.  PROCEDURE IN DETAIL:  The patient was taken the operating suite.  After induction of adequate general anesthesia, right upper extremity was prepped and draped in usual sterile fashion.  An Esmarch was used to exsanguinate the limb.  Tourniquet was then inflated to 250 mmHg.  At this point in time, incision was made over the volar aspect of the right distal radius.  Skin was incised longitudinally of the FCR tendon area. The interval between the FCR and radial artery was identified and developed down to the pronator quadratus.  An acute disruption of the pronator quadratus off the volar aspect of the distal radius and is a complex intra-articular fracture with comminution, multiple fragments, and dislocation the carpus dorsally.  A subperiosteal dissection was undertaken including release of the first dorsal compartment and brachioradialis.  Once this was done, the articular service was pieced back together.  There was large longitudinal ulnar fragment  that was carefully reduced and held with a reduction clamp.  A standard DVR plate was then placed on the lower aspect of the distal radius and fixed to the slotted hole.  Initial attempts were deemed to be too long and then the plate was moved more proximally.  It was fixed with 3 cortical screws proximally, followed by smooth pegs distally.  Reduction was confirmed both fluoroscopically and under direct vision.  No screws were placed in the joint due to stable reduction.  After this was completed, the wound was irrigated and loosely closed in layers of 0 Vicryl to reapproximate the pronator quadratus and a 4-0 Vicryl Rapide on the skin.  At this point in time, the open wound over the medial aspect of the elbow was thoroughly irrigated with a 1 L of normal saline.  The tourniquet was then dropped for 10 minutes.  It was then reinflated and a lateral approach to the elbow was undertaken.  There was a fracture dislocation of the radiocapitellar joint with a 4 part fracture of the radial head.  All 4 fragments were removed through this lateral approach.  There was significant disruption of the lateral collateral ligament area and the lateral extensor musculature.  Once the fragments were removed, they were placed on back table.  The Biomet radial head replacement set was then used to replace the radial head using appropriate technique including rasping of  the intramedullary canal.  A small cut was made at the head neck junction to smooth out the traumatic fracture of the head neck junction.  Once this was completed, a #8 stem and 24 head was placed.  The elbow was stable with flexion and extension.  There was still some instability laterally.  The wounds were thoroughly irrigated.  The lateral aspect was reconstructed using FiberWire suture and 0 Vicryl.  It was then much more stable both in flexion and extension and stressing of the collateral ligament complexes.  The wound was then closed  with 3-0 Vicryl and 4-0 Vicryl Rapide on the skin.  The medial wound was closed loosely with two 3-0 Vicryl sutures.  Xeroform, 4x4s, fluffs, and a compressive dressing was applied to the wrist and the elbow, and then a sugar-tong splint with the elbow in 90 degrees, forearm in neutral was placed and the patient was then placed in a Ace wrap.  The patient tolerated the procedure well and went to the recovery room in stable fashion.     Artist Pais Mina Marble, M.D.     MAW/MEDQ  D:  07/11/2012  T:  07/12/2012  Job:  [id number]

## 2012-07-12 NOTE — Op Note (Signed)
NAME:  Roger Palmer, Roger Palmer NO.:  [id number]  MEDICAL RECORD NO.:  [id number]  LOCATION:  3312                         FACILITY:  [facility]  PHYSICIAN:  Alvy Beal, MD    DATE OF BIRTH:  05-08-1951  DATE OF PROCEDURE:  07/11/2012 DATE OF DISCHARGE:                              OPERATIVE REPORT   PREOPERATIVE DIAGNOSIS:  Right acetabular fracture, closed.  POSTOPERATIVE DIAGNOSIS:  Right acetabular fracture, closed.  OPERATIVE PROCEDURE:  Insertion of right distal femoral traction pin.  HISTORY:  This is a very pleasant gentleman, who fell 15 feet from a ladder and had an open right elbow and wrist fracture as well as a closed right acetabular fracture.  The patient was brought to the operating room for fixation and treatment of his open injury by Dr. Mina Marble, but I was called because I was on call for General Orthopedics for the acetabular fracture.  The patient had a closed injury.  There was medial impaction of the femoral head.  In order to help reduce these and provide greater comfort and definitive fracture management of the acetabular fracture, we elected to proceed by placing a distal femoral traction pin.  All appropriate risks, benefits, and alternatives were discussed with the patient and consent was obtained.  OPERATIVE NOTE:  The patient was in the operating room.  After successful general anesthesia and endotracheal intubation, the time-out was done.  The right distal femur was prepped and draped.  A small medial incision was made, and I bluntly dissected down to the medial aspect of the femur.  I then placed a smooth large traction pin again to the lateral aspect of the femur, drilled into the femur, and made a counter incision on the lateral side and passed out the lateral side. There was no significant bleeding or complications.  X-rays in the OR on the portable fluoro machine demonstrated satisfactory position of the pins in both planes.  Dry  dressings were applied.  At this point, Dr. Mina Marble proceeded with his portion of surgery.  Refer to his dictation for specifics.     Alvy Beal, MD     DDB/MEDQ  D:  07/11/2012  T:  07/12/2012  Job:  [id number]

## 2012-07-12 NOTE — Progress Notes (Signed)
LOS: 1 day   Subjective: Pt doing well today.  Had right arm surgery yesterday, pain well controlled.  Pt hasn't had anything to eat yet because he was expecting to go to surgery.  Pt denies N/V would like to eat.  Pt not ambulating yet.    Objective: Vital signs in last 24 hours: Temp:  [97.5 F (36.4 C)-98.7 F (37.1 C)] 98.3 F (36.8 C) (03/02 0700) Pulse Rate:  [80-122] 104 (03/02 0700) Resp:  [0-25] 19 (03/02 0800) BP: (106-144)/(54-89) 114/65 mmHg (03/02 0700) SpO2:  [95 %-100 %] 96 % (03/02 0800) Weight:  [225 lb (102.059 kg)] 225 lb (102.059 kg) (03/02 0427) Last BM Date: 07/12/12  Lab Results:  CBC  Recent Labs  07/11/12 1728  07/12/12 0037 07/12/12 0610  WBC 22.8*  --   --  12.4*  HGB 11.0*  < > 10.8* 10.8*  HCT 32.2*  < > 31.4* 31.2*  PLT 221  --   --  183  < > = values in this interval not displayed. BMET  Recent Labs  07/11/12 1728 07/11/12 1756 07/11/12 2029 07/12/12 0610  NA 141 145 146* 143  K 2.9* 3.2* 3.7 3.6  CL 107 111  --  109  CO2 20  --   --  24  GLUCOSE 204* 206* 181* 201*  BUN 18 17  --  15  CREATININE 1.28 1.30  --  1.14  CALCIUM 8.5  --   --  8.4    Imaging: Dg Forearm Right  07/11/2012  *RADIOLOGY REPORT*  Clinical Data:   Fall, right arm pain.  Open fracture.  RIGHT FOREARM - 2 VIEW  Comparison: None.  Findings: Fracture is noted at the right elbow.  The distal humerus projects anteriorly relative to the radius and ulna. It is difficult to evaluate for fracture on these limited views of the elbow.  There is a comminuted distal right radial fracture.  Mild impaction.  Displacement fracture fragments.  No visible distal ulnar fracture.  IMPRESSION: Dislocation at the right elbow.  It is difficult to exclude fracture of the elbow on these projections.  Consider dedicated elbow series.  Comminuted, displaced and impacted distal right radial fracture.   Original Report Authenticated By: Charlett Nose, M.D.    Ct Head Wo Contrast  07/11/2012   *RADIOLOGY REPORT*  Clinical Data:  15 feet fall from ladder  CT HEAD WITHOUT CONTRAST CT CERVICAL SPINE WITHOUT CONTRAST  Technique:  Multidetector CT imaging of the head and cervical spine was performed following the standard protocol without intravenous contrast.  Multiplanar CT image reconstructions of the cervical spine were also generated.  Comparison:  None.  CT HEAD  Findings: No evidence of parenchymal hemorrhage or extra-axial fluid collection. No mass lesion, mass effect, or midline shift.  No CT evidence of acute infarction.  Cerebral volume is age appropriate.  No ventriculomegaly.  The visualized paranasal sinuses are essentially clear. The mastoid air cells are unopacified.  No evidence of calvarial fracture.  IMPRESSION: No evidence of acute intracranial abnormality.  CT CERVICAL SPINE  Findings: Straightening of cervical spine.  No evidence of fracture or dislocation.  Vertebral body heights are maintained.  The dens appears intact.  No prevertebral soft tissue swelling.  Mild multilevel degenerative changes.  Visualized thyroid is unremarkable.  Visualized lung apices are clear.  IMPRESSION: No evidence of traumatic injury to the cervical spine.  Mild multilevel degenerative changes.   Original Report Authenticated By: Charline Bills, M.D.  Ct Chest W Contrast  07/11/2012  *RADIOLOGY REPORT*  Clinical Data:  15 feet fall from ladder  CT CHEST, ABDOMEN AND PELVIS WITH CONTRAST  Technique:  Multidetector CT imaging of the chest, abdomen and pelvis was performed following the standard protocol during bolus administration of intravenous contrast.  Contrast:  100 ml Omnipaque-300 IV  Comparison:  None.  CT CHEST  Findings:  No evidence of acute aortic injury.  No mediastinal hematoma.  Mild dependent atelectasis lung bases.  No suspicious pulmonary nodules.  No pleural effusion or pneumothorax.  Visualized thyroid is unremarkable.  The heart is normal in size.  No pericardial effusion.  No  suspicious mediastinal, hilar, or axillary lymphadenopathy.  Mild degenerative changes of the thoracic spine.  No fracture is seen.  IMPRESSION: No evidence of traumatic injury to the chest.  CT ABDOMEN AND PELVIS  Findings:  Liver, spleen, pancreas, and adrenal glands are within normal limits.  Gallbladder is unremarkable.  No intrahepatic or extrahepatic ductal dilatation.  2 mm nonobstructing the left upper pole renal calculus (series 2/image 70).  3 mm nonobstructing left lower pole renal calculus (series 2/image 77).  Right kidney is within normal limits.  No hydronephrosis.  No evidence of bowel obstruction.  Normal appendix.  Atherosclerotic calcifications of the abdominal aorta and branch vessels.  No abdominopelvic ascites.  No free air.  Mild prostatomegaly, measuring 5.3 cm in transverse dimension, and notable for dystrophic calcifications.  Bladder is notable for indwelling Foley catheter.  Displaced fracture of the right iliac crest (series 2/image 101) extending to the superior and medial walls of the right acetabulum (series 2/images 117 and 125). Nondisplaced right inferior pubic ramus fracture (series 2/image 131).  Associated small volume right retroperitoneal/pelvic hemorrhage (series 2/image 125).  Mild degenerative changes of the lumbar spine.  IMPRESSION: Displaced fracture of the right iliac crest with associated comminuted acetabular fracture.  Nondisplaced right inferior pubic ramus fracture.  Associated small volume right retroperitoneal/pelvic hemorrhage.   Original Report Authenticated By: Charline Bills, M.D.    Ct Cervical Spine Wo Contrast  07/11/2012  *RADIOLOGY REPORT*  Clinical Data:  15 feet fall from ladder  CT HEAD WITHOUT CONTRAST CT CERVICAL SPINE WITHOUT CONTRAST  Technique:  Multidetector CT imaging of the head and cervical spine was performed following the standard protocol without intravenous contrast.  Multiplanar CT image reconstructions of the cervical spine were  also generated.  Comparison:  None.  CT HEAD  Findings: No evidence of parenchymal hemorrhage or extra-axial fluid collection. No mass lesion, mass effect, or midline shift.  No CT evidence of acute infarction.  Cerebral volume is age appropriate.  No ventriculomegaly.  The visualized paranasal sinuses are essentially clear. The mastoid air cells are unopacified.  No evidence of calvarial fracture.  IMPRESSION: No evidence of acute intracranial abnormality.  CT CERVICAL SPINE  Findings: Straightening of cervical spine.  No evidence of fracture or dislocation.  Vertebral body heights are maintained.  The dens appears intact.  No prevertebral soft tissue swelling.  Mild multilevel degenerative changes.  Visualized thyroid is unremarkable.  Visualized lung apices are clear.  IMPRESSION: No evidence of traumatic injury to the cervical spine.  Mild multilevel degenerative changes.   Original Report Authenticated By: Charline Bills, M.D.    Ct Abdomen Pelvis W Contrast  07/11/2012  *RADIOLOGY REPORT*  Clinical Data:  15 feet fall from ladder  CT CHEST, ABDOMEN AND PELVIS WITH CONTRAST  Technique:  Multidetector CT imaging of the chest, abdomen and pelvis was  performed following the standard protocol during bolus administration of intravenous contrast.  Contrast:  100 ml Omnipaque-300 IV  Comparison:  None.  CT CHEST  Findings:  No evidence of acute aortic injury.  No mediastinal hematoma.  Mild dependent atelectasis lung bases.  No suspicious pulmonary nodules.  No pleural effusion or pneumothorax.  Visualized thyroid is unremarkable.  The heart is normal in size.  No pericardial effusion.  No suspicious mediastinal, hilar, or axillary lymphadenopathy.  Mild degenerative changes of the thoracic spine.  No fracture is seen.  IMPRESSION: No evidence of traumatic injury to the chest.  CT ABDOMEN AND PELVIS  Findings:  Liver, spleen, pancreas, and adrenal glands are within normal limits.  Gallbladder is unremarkable.  No  intrahepatic or extrahepatic ductal dilatation.  2 mm nonobstructing the left upper pole renal calculus (series 2/image 70).  3 mm nonobstructing left lower pole renal calculus (series 2/image 77).  Right kidney is within normal limits.  No hydronephrosis.  No evidence of bowel obstruction.  Normal appendix.  Atherosclerotic calcifications of the abdominal aorta and branch vessels.  No abdominopelvic ascites.  No free air.  Mild prostatomegaly, measuring 5.3 cm in transverse dimension, and notable for dystrophic calcifications.  Bladder is notable for indwelling Foley catheter.  Displaced fracture of the right iliac crest (series 2/image 101) extending to the superior and medial walls of the right acetabulum (series 2/images 117 and 125). Nondisplaced right inferior pubic ramus fracture (series 2/image 131).  Associated small volume right retroperitoneal/pelvic hemorrhage (series 2/image 125).  Mild degenerative changes of the lumbar spine.  IMPRESSION: Displaced fracture of the right iliac crest with associated comminuted acetabular fracture.  Nondisplaced right inferior pubic ramus fracture.  Associated small volume right retroperitoneal/pelvic hemorrhage.   Original Report Authenticated By: Charline Bills, M.D.    Dg Pelvis Portable  07/11/2012  *RADIOLOGY REPORT*  Clinical Data: Fall, right hip pain.  PORTABLE PELVIS  Comparison: None.  Findings: Cortical disruption and irregularity noted in the region of the right ischium and medial acetabulum compatible with acetabular fracture.  No proximal femoral fracture.  SI joints are symmetric and unremarkable.  IMPRESSION: Right medial acetabular fracture.   Original Report Authenticated By: Charlett Nose, M.D.    Dg Chest Portable 1 View  07/11/2012  *RADIOLOGY REPORT*  Clinical Data: Trauma, fall  PORTABLE CHEST - 1 VIEW  Comparison: None.  Findings: Lungs are clear. No pleural effusion or pneumothorax.  Cardiomediastinal silhouette is within normal limits.   IMPRESSION: No evidence of acute cardiopulmonary disease.   Original Report Authenticated By: Charline Bills, M.D.      PE: General appearance: alert, cooperative and no distress Resp: clear to auscultation bilaterally Cardio: regular rate and rhythm, S1, S2 normal, no murmur, click, rub or gallop GI: soft, non-tender; bowel sounds normal; no masses,  no organomegaly Extremities: Right leg in traction splint, left leg without abnormalitiey, right arm in splint, left arm without abnormality; b/l UE/LE intact sensation Pulses: 2+ and symmetric for UE and LE   Assessment/Plan: S/p fall from tree on 07/11/12 Open right elbow dislocation - s/p ORIF of right wrist and elbow with radial head replacement and reconstruction of lateral elbow R distal radius fx R iliac wing fx R acetabular fx - Distal femoral traction pin for decompression and stabilization per Dr. Shon Baton, pending evaluation from Dr. Carola Frost and likely surgery on Monday or Tuesday R inf pubic ramus fx ABL anemia - stable at 10.8 since transfusion VTE - SCD's, Lovenox FEN - Cont IVF,  abx, pain control, allow clear liquids Dispo -- going to be here through to next week, PT/OT when cleared by ortho   Aris Georgia, PA-C Pager: [phone] General Trauma PA Pager: [phone]   07/12/2012

## 2012-07-12 NOTE — Progress Notes (Signed)
Patient ID: Roger Palmer, male   DOB: [birth year], 62 y.o.   MRN: [id number] Minimal complaints of pain this PM  NVI grossly with moderate digital swelling  Will need dressing change at 7-10 days post op  Will follow with trauma service

## 2012-07-12 NOTE — Progress Notes (Signed)
Chaplain responded to ED trauma B after receiving a Level II trauma page. Pt fell from a ladder. No family member was present at this time. Chaplain provided ministry of presence until pt was sent to OR. Chaplain will follow-up as needed. Kelle Darting [phone]

## 2012-07-12 NOTE — Progress Notes (Signed)
Agree with above.  Will feed today.  Ortho surgery next week.  Wilmon Arms. Corliss Skains, MD, [facility]  07/12/2012 11:35 AM

## 2012-07-12 NOTE — ED Provider Notes (Signed)
I saw and evaluated the patient, reviewed the resident's note and I agree with the findings and plan.   .Face to face Exam:  General:  Awake HEENT:  Atraumatic Resp:  Normal effort Abd:  Nondistended Neuro:No focal weakness Lymph: No adenopathy   Nelia Shi, MD 07/12/12 [phone]

## 2012-07-12 NOTE — Progress Notes (Signed)
Orthopedics Progress Note  Subjective: My hip feels ok.  Objective:  Filed Vitals:   07/12/12 0800  BP:   Pulse:   Temp:   Resp: 19    General: Awake and alert  Musculoskeletal: right LE in balanced skeletal traction. NVI incl EHL and TA, sensation intact Neurovascularly intact  Lab Results  Component Value Date   WBC 12.4* 07/12/2012   HGB 10.8* 07/12/2012   HCT 31.2* 07/12/2012   MCV 88.6 07/12/2012   PLT 183 07/12/2012       Component Value Date/Time   NA 143 07/12/2012 0610   K 3.6 07/12/2012 0610   CL 109 07/12/2012 0610   CO2 24 07/12/2012 0610   GLUCOSE 201* 07/12/2012 0610   BUN 15 07/12/2012 0610   CREATININE 1.14 07/12/2012 0610   CALCIUM 8.4 07/12/2012 0610   GFRNONAA 67* 07/12/2012 0610   GFRAA 78* 07/12/2012 0610    Lab Results  Component Value Date   INR 1.13 07/11/2012    Assessment/Plan: POD #1 s/p Procedure(s): Distal femoral traction pin placement for displaced acetabular fracture right hip Bedrest, Handy to see patient this week  Almedia Balls. Ranell Patrick, MD 07/12/2012 9:26 AM

## 2012-07-13 ENCOUNTER — Encounter (HOSPITAL_COMMUNITY): Payer: Self-pay | Admitting: Orthopedic Surgery

## 2012-07-13 ENCOUNTER — Inpatient Hospital Stay (HOSPITAL_COMMUNITY): Payer: 59

## 2012-07-13 DIAGNOSIS — E119 Type 2 diabetes mellitus without complications: Secondary | ICD-10-CM | POA: Insufficient documentation

## 2012-07-13 DIAGNOSIS — W14XXXA Fall from tree, initial encounter: Secondary | ICD-10-CM

## 2012-07-13 DIAGNOSIS — N400 Enlarged prostate without lower urinary tract symptoms: Secondary | ICD-10-CM | POA: Insufficient documentation

## 2012-07-13 DIAGNOSIS — M549 Dorsalgia, unspecified: Secondary | ICD-10-CM | POA: Insufficient documentation

## 2012-07-13 DIAGNOSIS — I10 Essential (primary) hypertension: Secondary | ICD-10-CM | POA: Insufficient documentation

## 2012-07-13 LAB — CBC
Hemoglobin: 11 g/dL — ABNORMAL LOW (ref 13.0–17.0)
MCH: 31 pg (ref 26.0–34.0)
RBC: 3.55 MIL/uL — ABNORMAL LOW (ref 4.22–5.81)

## 2012-07-13 LAB — BASIC METABOLIC PANEL
CO2: 24 mEq/L (ref 19–32)
GFR calc non Af Amer: 89 mL/min — ABNORMAL LOW (ref >90)
Glucose, Bld: 195 mg/dL — ABNORMAL HIGH (ref 70–99)
Potassium: 3.1 mEq/L — ABNORMAL LOW (ref 3.5–5.1)
Sodium: 134 mEq/L — ABNORMAL LOW (ref 135–145)

## 2012-07-13 LAB — GLUCOSE, CAPILLARY
Glucose-Capillary: 173 mg/dL — ABNORMAL HIGH (ref 70–99)
Glucose-Capillary: 169 mg/dL — ABNORMAL HIGH (ref 70–99)
Glucose-Capillary: 206 mg/dL — ABNORMAL HIGH (ref 70–99)

## 2012-07-13 NOTE — Progress Notes (Signed)
Trauma Service Note  Subjective: Patient with no events throughout the night.  Objective: Vital signs in last 24 hours: Temp:  [98 F (36.7 C)-99.5 F (37.5 C)] 99.5 F (37.5 C) (03/03 0748) Pulse Rate:  [103-115] 110 (03/03 0433) Resp:  [10-20] 14 (03/03 0433) BP: (117-141)/(60-76) 141/72 mmHg (03/03 0433) SpO2:  [94 %-98 %] 97 % (03/03 0433) Last BM Date: 07/11/12  Intake/Output from previous day: 03/02 0701 - 03/03 0700 In: 4098 [P.O.:1560; I.V.:2438; IV Piggyback:100] Out: 1700 [Urine:1700] Intake/Output this shift:    General: No acute distress.    Lungs: Clear  Abd: Mildly distended.  Good bowel sounds.  Spasmodic pain of abdomen, gas  Extremities: Right elbow in splint.  Right leg in traction.  Good perfusion.  Neuro: Intact  Lab Results: CBC   Recent Labs  07/11/12 1728  07/12/12 0037 07/12/12 0610  WBC 22.8*  --   --  12.4*  HGB 11.0*  < > 10.8* 10.8*  HCT 32.2*  < > 31.4* 31.2*  PLT 221  --   --  183  < > = values in this interval not displayed. BMET  Recent Labs  07/11/12 1728 07/11/12 1756 07/11/12 2029 07/12/12 0610  NA 141 145 146* 143  K 2.9* 3.2* 3.7 3.6  CL 107 111  --  109  CO2 20  --   --  24  GLUCOSE 204* 206* 181* 201*  BUN 18 17  --  15  CREATININE 1.28 1.30  --  1.14  CALCIUM 8.5  --   --  8.4   PT/INR  Recent Labs  07/11/12 1728  LABPROT 14.3  INR 1.13   ABG No results found for this basename: PHART, PCO2, PO2, HCO3,  in the last 72 hours  Studies/Results: Dg Forearm Right  07/11/2012  *RADIOLOGY REPORT*  Clinical Data:   Fall, right arm pain.  Open fracture.  RIGHT FOREARM - 2 VIEW  Comparison: None.  Findings: Fracture is noted at the right elbow.  The distal humerus projects anteriorly relative to the radius and ulna. It is difficult to evaluate for fracture on these limited views of the elbow.  There is a comminuted distal right radial fracture.  Mild impaction.  Displacement fracture fragments.  No visible distal  ulnar fracture.  IMPRESSION: Dislocation at the right elbow.  It is difficult to exclude fracture of the elbow on these projections.  Consider dedicated elbow series.  Comminuted, displaced and impacted distal right radial fracture.   Original Report Authenticated By: Charlett Nose, M.D.    Ct Head Wo Contrast  07/11/2012  *RADIOLOGY REPORT*  Clinical Data:  15 feet fall from ladder  CT HEAD WITHOUT CONTRAST CT CERVICAL SPINE WITHOUT CONTRAST  Technique:  Multidetector CT imaging of the head and cervical spine was performed following the standard protocol without intravenous contrast.  Multiplanar CT image reconstructions of the cervical spine were also generated.  Comparison:  None.  CT HEAD  Findings: No evidence of parenchymal hemorrhage or extra-axial fluid collection. No mass lesion, mass effect, or midline shift.  No CT evidence of acute infarction.  Cerebral volume is age appropriate.  No ventriculomegaly.  The visualized paranasal sinuses are essentially clear. The mastoid air cells are unopacified.  No evidence of calvarial fracture.  IMPRESSION: No evidence of acute intracranial abnormality.  CT CERVICAL SPINE  Findings: Straightening of cervical spine.  No evidence of fracture or dislocation.  Vertebral body heights are maintained.  The dens appears intact.  No prevertebral  soft tissue swelling.  Mild multilevel degenerative changes.  Visualized thyroid is unremarkable.  Visualized lung apices are clear.  IMPRESSION: No evidence of traumatic injury to the cervical spine.  Mild multilevel degenerative changes.   Original Report Authenticated By: Charline Bills, M.D.    Ct Chest W Contrast  07/11/2012  *RADIOLOGY REPORT*  Clinical Data:  15 feet fall from ladder  CT CHEST, ABDOMEN AND PELVIS WITH CONTRAST  Technique:  Multidetector CT imaging of the chest, abdomen and pelvis was performed following the standard protocol during bolus administration of intravenous contrast.  Contrast:  100 ml  Omnipaque-300 IV  Comparison:  None.  CT CHEST  Findings:  No evidence of acute aortic injury.  No mediastinal hematoma.  Mild dependent atelectasis lung bases.  No suspicious pulmonary nodules.  No pleural effusion or pneumothorax.  Visualized thyroid is unremarkable.  The heart is normal in size.  No pericardial effusion.  No suspicious mediastinal, hilar, or axillary lymphadenopathy.  Mild degenerative changes of the thoracic spine.  No fracture is seen.  IMPRESSION: No evidence of traumatic injury to the chest.  CT ABDOMEN AND PELVIS  Findings:  Liver, spleen, pancreas, and adrenal glands are within normal limits.  Gallbladder is unremarkable.  No intrahepatic or extrahepatic ductal dilatation.  2 mm nonobstructing the left upper pole renal calculus (series 2/image 70).  3 mm nonobstructing left lower pole renal calculus (series 2/image 77).  Right kidney is within normal limits.  No hydronephrosis.  No evidence of bowel obstruction.  Normal appendix.  Atherosclerotic calcifications of the abdominal aorta and branch vessels.  No abdominopelvic ascites.  No free air.  Mild prostatomegaly, measuring 5.3 cm in transverse dimension, and notable for dystrophic calcifications.  Bladder is notable for indwelling Foley catheter.  Displaced fracture of the right iliac crest (series 2/image 101) extending to the superior and medial walls of the right acetabulum (series 2/images 117 and 125). Nondisplaced right inferior pubic ramus fracture (series 2/image 131).  Associated small volume right retroperitoneal/pelvic hemorrhage (series 2/image 125).  Mild degenerative changes of the lumbar spine.  IMPRESSION: Displaced fracture of the right iliac crest with associated comminuted acetabular fracture.  Nondisplaced right inferior pubic ramus fracture.  Associated small volume right retroperitoneal/pelvic hemorrhage.   Original Report Authenticated By: Charline Bills, M.D.    Ct Cervical Spine Wo Contrast  07/11/2012   *RADIOLOGY REPORT*  Clinical Data:  15 feet fall from ladder  CT HEAD WITHOUT CONTRAST CT CERVICAL SPINE WITHOUT CONTRAST  Technique:  Multidetector CT imaging of the head and cervical spine was performed following the standard protocol without intravenous contrast.  Multiplanar CT image reconstructions of the cervical spine were also generated.  Comparison:  None.  CT HEAD  Findings: No evidence of parenchymal hemorrhage or extra-axial fluid collection. No mass lesion, mass effect, or midline shift.  No CT evidence of acute infarction.  Cerebral volume is age appropriate.  No ventriculomegaly.  The visualized paranasal sinuses are essentially clear. The mastoid air cells are unopacified.  No evidence of calvarial fracture.  IMPRESSION: No evidence of acute intracranial abnormality.  CT CERVICAL SPINE  Findings: Straightening of cervical spine.  No evidence of fracture or dislocation.  Vertebral body heights are maintained.  The dens appears intact.  No prevertebral soft tissue swelling.  Mild multilevel degenerative changes.  Visualized thyroid is unremarkable.  Visualized lung apices are clear.  IMPRESSION: No evidence of traumatic injury to the cervical spine.  Mild multilevel degenerative changes.   Original Report Authenticated  By: Charline Bills, M.D.    Ct Abdomen Pelvis W Contrast  07/11/2012  *RADIOLOGY REPORT*  Clinical Data:  15 feet fall from ladder  CT CHEST, ABDOMEN AND PELVIS WITH CONTRAST  Technique:  Multidetector CT imaging of the chest, abdomen and pelvis was performed following the standard protocol during bolus administration of intravenous contrast.  Contrast:  100 ml Omnipaque-300 IV  Comparison:  None.  CT CHEST  Findings:  No evidence of acute aortic injury.  No mediastinal hematoma.  Mild dependent atelectasis lung bases.  No suspicious pulmonary nodules.  No pleural effusion or pneumothorax.  Visualized thyroid is unremarkable.  The heart is normal in size.  No pericardial effusion.   No suspicious mediastinal, hilar, or axillary lymphadenopathy.  Mild degenerative changes of the thoracic spine.  No fracture is seen.  IMPRESSION: No evidence of traumatic injury to the chest.  CT ABDOMEN AND PELVIS  Findings:  Liver, spleen, pancreas, and adrenal glands are within normal limits.  Gallbladder is unremarkable.  No intrahepatic or extrahepatic ductal dilatation.  2 mm nonobstructing the left upper pole renal calculus (series 2/image 70).  3 mm nonobstructing left lower pole renal calculus (series 2/image 77).  Right kidney is within normal limits.  No hydronephrosis.  No evidence of bowel obstruction.  Normal appendix.  Atherosclerotic calcifications of the abdominal aorta and branch vessels.  No abdominopelvic ascites.  No free air.  Mild prostatomegaly, measuring 5.3 cm in transverse dimension, and notable for dystrophic calcifications.  Bladder is notable for indwelling Foley catheter.  Displaced fracture of the right iliac crest (series 2/image 101) extending to the superior and medial walls of the right acetabulum (series 2/images 117 and 125). Nondisplaced right inferior pubic ramus fracture (series 2/image 131).  Associated small volume right retroperitoneal/pelvic hemorrhage (series 2/image 125).  Mild degenerative changes of the lumbar spine.  IMPRESSION: Displaced fracture of the right iliac crest with associated comminuted acetabular fracture.  Nondisplaced right inferior pubic ramus fracture.  Associated small volume right retroperitoneal/pelvic hemorrhage.   Original Report Authenticated By: Charline Bills, M.D.    Dg Pelvis Portable  07/11/2012  *RADIOLOGY REPORT*  Clinical Data: Fall, right hip pain.  PORTABLE PELVIS  Comparison: None.  Findings: Cortical disruption and irregularity noted in the region of the right ischium and medial acetabulum compatible with acetabular fracture.  No proximal femoral fracture.  SI joints are symmetric and unremarkable.  IMPRESSION: Right medial  acetabular fracture.   Original Report Authenticated By: Charlett Nose, M.D.    Dg Chest Portable 1 View  07/11/2012  *RADIOLOGY REPORT*  Clinical Data: Trauma, fall  PORTABLE CHEST - 1 VIEW  Comparison: None.  Findings: Lungs are clear. No pleural effusion or pneumothorax.  Cardiomediastinal silhouette is within normal limits.  IMPRESSION: No evidence of acute cardiopulmonary disease.   Original Report Authenticated By: Charline Bills, M.D.     Anti-infectives: Anti-infectives   Start     Dose/Rate Route Frequency Ordered Stop   07/12/12 0600  ceFAZolin (ANCEF) IVPB 1 g/50 mL premix     1 g 100 mL/hr over 30 Minutes Intravenous 3 times per day 07/11/12 2242 07/15/12 0559   07/11/12 1615  ceFAZolin (ANCEF) IVPB 1 g/50 mL premix     1 g 100 mL/hr over 30 Minutes Intravenous  Once 07/11/12 1609 07/11/12 1700      Assessment/Plan: s/p Procedure(s): OPEN REDUCTION INTERNAL FIXATION (ORIF) DISTAL RADIAL FRACTURE OPEN REDUCTION INTERNAL FIXATION (ORIF) RADIAL head FRACTURE NPO pending evaluation by Dr. Carola Frost and his  service. Will likely require plating of his iliac wing and acetabulum  LOS: 2 days   Marta Lamas. Gae Bon, MD, FACS [phone] Trauma Surgeon 07/13/2012

## 2012-07-13 NOTE — Consult Note (Signed)
Orthopaedic Trauma Service Consult  Reason for Consult: R acetabulum fracture  Referring Physician: D. Shon Baton, MD (Ortho)   HPI:   Pt is a 62 y/o caucasian male who sustained a fall of about 15 ft when he fell out of a tree on 07/11/2012.  Pt was using a chainsaw, cutting branches from a tree when he fell off a ladder, landing on his R side.  Pt had immediate onset of pain in R arm and R hip along with the inability to bear weight.  Pt was brought to [place] [facility] where he was seen by Drs. Weingold and [place] regarding his ortho injuries. Pt was also admitted to the Trauma service.  Pt was taken to the OR for I&D of his open R elbow fx by Dr. Mina Marble. He also performed ORIF of his R elbow along with a R radial head replacement and ORIF of the R wrist.  While in the OR Dr. Shon Baton placed a skeletal traction pin to help prevent further displacement and protrusion of the femoral head into the the R acetabulum.  Due to the complexity of the fracture the OTS and Dr. Carola Frost was consulted for management of the R acetabulum fx.   Pt is currently in the SDU, complains of R arm and R hip pain.  No other specific complaints.  Pt not too hungry at current time either.  Denies any numbness or tingling.  No additional injuries noted.    Past Medical History  Diagnosis Date  . Bee sting allergy   . Hypercholesteremia   . Nephrolithiasis   . Herniated disc     c7-c7, c5-6, c3-4, c4-5  . Hypertension   . Back pain   . Obesity   . BPH (benign prostatic hypertrophy)   . Gastritis   . Reflux   . Diabetes mellitus without complication     Past Surgical History  Procedure Laterality Date  . Inguinal hernia repair      right  . Kidney stones      x2 Dr. Earlene Plater   . Orif r elbow  07/12/2012  . Orif r wirst  07/12/2012    Family History  Problem Relation Age of Onset  . Heart attack Mother   . Diabetes Mother     NIDDM  . Hypertension Mother   . Hypertension Father   . Stroke Father   . Nephrolithiasis  Brother     Social History:  reports that he has never smoked. He does not have any smokeless tobacco history on file. He reports that  drinks alcohol. He reports that he does not use illicit drugs.  Allergies: No Known Allergies  Medications:  I have reviewed the patient's current medications. Prior to Admission:  Prescriptions prior to admission  Medication Sig Dispense Refill  . atorvastatin (LIPITOR) 40 MG tablet Take 40 mg by mouth daily.      Marland Kitchen dutasteride (AVODART) 0.5 MG capsule Take 0.5 mg by mouth daily.        . fenofibrate micronized (LOFIBRA) 134 MG capsule Take 134 mg by mouth daily before breakfast.      . metFORMIN (GLUCOPHAGE) 500 MG tablet Take 500 mg by mouth daily.       Marland Kitchen omeprazole (PRILOSEC) 20 MG capsule Take 20 mg by mouth daily.        . quinapril (ACCUPRIL) 40 MG tablet Take 40 mg by mouth at bedtime.        . Tamsulosin HCl (FLOMAX) 0.4 MG CAPS Take 0.4  mg by mouth daily.       . valsartan-hydrochlorothiazide (DIOVAN-HCT) 160-25 MG per tablet Take 1 tablet by mouth daily.         Scheduled: .  ceFAZolin (ANCEF) IV  1 g Intravenous Q8H  . dutasteride  0.5 mg Oral Daily  . fentaNYL  50-100 mcg Intravenous Once  . irbesartan  150 mg Oral Daily   And  . hydrochlorothiazide  25 mg Oral Daily  . HYDROmorphone PCA 0.3 mg/mL   Intravenous Q4H  . insulin aspart  0-15 Units Subcutaneous Q4H  . lisinopril  40 mg Oral QHS  . pantoprazole  40 mg Oral Daily   Or  . pantoprazole (PROTONIX) IV  40 mg Intravenous Daily  . tamsulosin  0.4 mg Oral Daily    Results for orders placed during the hospital encounter of 07/11/12 (from the past 48 hour(s))  URINALYSIS, MICROSCOPIC ONLY     Status: Abnormal   Collection Time    07/11/12  4:24 PM      Result Value Range   Color, Urine YELLOW  YELLOW   APPearance CLEAR  CLEAR   Specific Gravity, Urine 1.019  1.005 - 1.030   pH 5.5  5.0 - 8.0   Glucose, UA 100 (*) NEGATIVE mg/dL   Hgb urine dipstick LARGE (*) NEGATIVE    Bilirubin Urine NEGATIVE  NEGATIVE   Ketones, ur NEGATIVE  NEGATIVE mg/dL   Protein, ur NEGATIVE  NEGATIVE mg/dL   Urobilinogen, UA 0.2  0.0 - 1.0 mg/dL   Nitrite NEGATIVE  NEGATIVE   Leukocytes, UA TRACE (*) NEGATIVE   WBC, UA 0-2  <3 WBC/hpf   RBC / HPF 11-20  <3 RBC/hpf   Squamous Epithelial / LPF RARE  RARE  POCT I-STAT, CHEM 8     Status: Abnormal   Collection Time    07/11/12  4:45 PM      Result Value Range   Sodium 152 (*) 135 - 145 mEq/L   Potassium 2.1 (*) 3.5 - 5.1 mEq/L   Chloride 119 (*) 96 - 112 mEq/L   BUN 12  6 - 23 mg/dL   Creatinine, Ser 4.09  0.50 - 1.35 mg/dL   Glucose, Bld 811 (*) 70 - 99 mg/dL   Calcium, Ion 9.14 (*) 1.13 - 1.30 mmol/L   TCO2 15  0 - 100 mmol/L   Hemoglobin 8.2 (*) 13.0 - 17.0 g/dL   HCT 78.2 (*) 95.6 - 21.3 %   Comment NOTIFIED PHYSICIAN    CG4 I-STAT (LACTIC ACID)     Status: Abnormal   Collection Time    07/11/12  4:46 PM      Result Value Range   Lactic Acid, Venous 3.67 (*) 0.5 - 2.2 mmol/L  TYPE AND SCREEN     Status: None   Collection Time    07/11/12  5:28 PM      Result Value Range   ABO/RH(D) O NEG     Antibody Screen NEG     Sample Expiration 07/14/2012     Unit Number [id number]     Blood Component Type RBC LR PHER2     Unit division 00     Status of Unit ISSUED,FINAL     Transfusion Status OK TO TRANSFUSE     Crossmatch Result Compatible     Unit Number [id number]     Blood Component Type RED CELLS,LR     Unit division 00     Status of Unit ISSUED,FINAL  Transfusion Status OK TO TRANSFUSE     Crossmatch Result Compatible     Unit Number [id number]     Blood Component Type RBC LR PHER1     Unit division 00     Status of Unit REL FROM [facility]     Transfusion Status OK TO TRANSFUSE     Crossmatch Result Compatible     Unit Number [id number]     Blood Component Type RED CELLS,LR     Unit division 00     Status of Unit REL FROM [facility]     Transfusion Status OK TO TRANSFUSE     Crossmatch Result  Compatible    CBC     Status: Abnormal   Collection Time    07/11/12  5:28 PM      Result Value Range   WBC 22.8 (*) 4.0 - 10.5 K/uL   RBC 3.62 (*) 4.22 - 5.81 MIL/uL   Hemoglobin 11.0 (*) 13.0 - 17.0 g/dL   Comment: DELTA CHECK NOTED   HCT 32.2 (*) 39.0 - 52.0 %   MCV 89.0  78.0 - 100.0 fL   MCH 30.4  26.0 - 34.0 pg   MCHC 34.2  30.0 - 36.0 g/dL   RDW 30.8  65.7 - 84.6 %   Platelets 221  150 - 400 K/uL  COMPREHENSIVE METABOLIC PANEL     Status: Abnormal   Collection Time    07/11/12  5:28 PM      Result Value Range   Sodium 141  135 - 145 mEq/L   Comment: DELTA CHECK NOTED   Potassium 2.9 (*) 3.5 - 5.1 mEq/L   Comment: DELTA CHECK NOTED   Chloride 107  96 - 112 mEq/L   Comment: DELTA CHECK NOTED   CO2 20  19 - 32 mEq/L   Glucose, Bld 204 (*) 70 - 99 mg/dL   BUN 18  6 - 23 mg/dL   Creatinine, Ser 9.62  0.50 - 1.35 mg/dL   Comment: DELTA CHECK NOTED   Calcium 8.5  8.4 - 10.5 mg/dL   Total Protein 6.0  6.0 - 8.3 g/dL   Albumin 3.2 (*) 3.5 - 5.2 g/dL   AST 35  0 - 37 U/L   ALT 28  0 - 53 U/L   Alkaline Phosphatase 40  39 - 117 U/L   Total Bilirubin 0.2 (*) 0.3 - 1.2 mg/dL   GFR calc non Af Amer 58 (*) >90 mL/min   GFR calc Af Amer 68 (*) >90 mL/min   Comment:            The eGFR has been calculated     using the CKD EPI equation.     This calculation has not been     validated in all clinical     situations.     eGFR's persistently     <90 mL/min signify     possible Chronic Kidney Disease.  PROTIME-INR     Status: None   Collection Time    07/11/12  5:28 PM      Result Value Range   Prothrombin Time 14.3  11.6 - 15.2 seconds   INR 1.13  0.00 - 1.49  ABO/RH     Status: None   Collection Time    07/11/12  5:28 PM      Result Value Range   ABO/RH(D) O NEG    POCT I-STAT, CHEM 8     Status: Abnormal   Collection Time  07/11/12  5:56 PM      Result Value Range   Sodium 145  135 - 145 mEq/L   Potassium 3.2 (*) 3.5 - 5.1 mEq/L   Chloride 111  96 - 112 mEq/L    BUN 17  6 - 23 mg/dL   Creatinine, Ser 1.61  0.50 - 1.35 mg/dL   Glucose, Bld 096 (*) 70 - 99 mg/dL   Calcium, Ion 0.45  4.09 - 1.30 mmol/L   TCO2 22  0 - 100 mmol/L   Hemoglobin 10.9 (*) 13.0 - 17.0 g/dL   HCT 81.1 (*) 91.4 - 78.2 %  POCT I-STAT 4, (NA,K, GLUC, HGB,HCT)     Status: Abnormal   Collection Time    07/11/12  8:29 PM      Result Value Range   Sodium 146 (*) 135 - 145 mEq/L   Potassium 3.7  3.5 - 5.1 mEq/L   Glucose, Bld 181 (*) 70 - 99 mg/dL   HCT 95.6 (*) 21.3 - 08.6 %   Hemoglobin 8.8 (*) 13.0 - 17.0 g/dL  PREPARE RBC (CROSSMATCH)     Status: None   Collection Time    07/11/12  8:55 PM      Result Value Range   Order Confirmation ORDER PROCESSED BY BLOOD BANK    GLUCOSE, CAPILLARY     Status: Abnormal   Collection Time    07/11/12 10:28 PM      Result Value Range   Glucose-Capillary 202 (*) 70 - 99 mg/dL   Comment 1 Notify RN    HEMOGLOBIN AND HEMATOCRIT, BLOOD     Status: Abnormal   Collection Time    07/12/12 12:37 AM      Result Value Range   Hemoglobin 10.8 (*) 13.0 - 17.0 g/dL   Comment: DELTA CHECK NOTED     POST TRANSFUSION SPECIMEN   HCT 31.4 (*) 39.0 - 52.0 %  MRSA PCR SCREENING     Status: None   Collection Time    07/12/12 12:52 AM      Result Value Range   MRSA by PCR NEGATIVE  NEGATIVE   Comment:            The GeneXpert MRSA Assay (FDA     approved for NASAL specimens     only), is one component of a     comprehensive MRSA colonization     surveillance program. It is not     intended to diagnose MRSA     infection nor to guide or     monitor treatment for     MRSA infections.  GLUCOSE, CAPILLARY     Status: Abnormal   Collection Time    07/12/12 12:59 AM      Result Value Range   Glucose-Capillary 238 (*) 70 - 99 mg/dL   Comment 1 Notify RN    GLUCOSE, CAPILLARY     Status: Abnormal   Collection Time    07/12/12  4:02 AM      Result Value Range   Glucose-Capillary 177 (*) 70 - 99 mg/dL   Comment 1 Notify RN    CBC      Status: Abnormal   Collection Time    07/12/12  6:10 AM      Result Value Range   WBC 12.4 (*) 4.0 - 10.5 K/uL   RBC 3.52 (*) 4.22 - 5.81 MIL/uL   Hemoglobin 10.8 (*) 13.0 - 17.0 g/dL   HCT 57.8 (*) 46.9 - 62.9 %  MCV 88.6  78.0 - 100.0 fL   MCH 30.7  26.0 - 34.0 pg   MCHC 34.6  30.0 - 36.0 g/dL   RDW 96.0  45.4 - 09.8 %   Platelets 183  150 - 400 K/uL  BASIC METABOLIC PANEL     Status: Abnormal   Collection Time    07/12/12  6:10 AM      Result Value Range   Sodium 143  135 - 145 mEq/L   Potassium 3.6  3.5 - 5.1 mEq/L   Chloride 109  96 - 112 mEq/L   CO2 24  19 - 32 mEq/L   Glucose, Bld 201 (*) 70 - 99 mg/dL   BUN 15  6 - 23 mg/dL   Creatinine, Ser 1.19  0.50 - 1.35 mg/dL   Calcium 8.4  8.4 - 14.7 mg/dL   GFR calc non Af Amer 67 (*) >90 mL/min   GFR calc Af Amer 78 (*) >90 mL/min   Comment:            The eGFR has been calculated     using the CKD EPI equation.     This calculation has not been     validated in all clinical     situations.     eGFR's persistently     <90 mL/min signify     possible Chronic Kidney Disease.  GLUCOSE, CAPILLARY     Status: Abnormal   Collection Time    07/12/12  7:45 AM      Result Value Range   Glucose-Capillary 192 (*) 70 - 99 mg/dL   Comment 1 Documented in Chart     Comment 2 Notify RN    GLUCOSE, CAPILLARY     Status: Abnormal   Collection Time    07/12/12 11:48 AM      Result Value Range   Glucose-Capillary 173 (*) 70 - 99 mg/dL   Comment 1 Documented in Chart     Comment 2 Notify RN    GLUCOSE, CAPILLARY     Status: Abnormal   Collection Time    07/12/12  4:54 PM      Result Value Range   Glucose-Capillary 169 (*) 70 - 99 mg/dL   Comment 1 Notify RN    GLUCOSE, CAPILLARY     Status: Abnormal   Collection Time    07/12/12  9:28 PM      Result Value Range   Glucose-Capillary 206 (*) 70 - 99 mg/dL  GLUCOSE, CAPILLARY     Status: Abnormal   Collection Time    07/12/12 11:16 PM      Result Value Range    Glucose-Capillary 179 (*) 70 - 99 mg/dL   Comment 1 Notify RN    GLUCOSE, CAPILLARY     Status: Abnormal   Collection Time    07/13/12  4:30 AM      Result Value Range   Glucose-Capillary 197 (*) 70 - 99 mg/dL   Comment 1 Notify RN    GLUCOSE, CAPILLARY     Status: Abnormal   Collection Time    07/13/12  7:54 AM      Result Value Range   Glucose-Capillary 205 (*) 70 - 99 mg/dL   Comment 1 Notify RN     Comment 2 Documented in Chart      Dg Forearm Right  07/11/2012  *RADIOLOGY REPORT*  Clinical Data:   Fall, right arm pain.  Open fracture.  RIGHT FOREARM - 2 VIEW  Comparison: None.  Findings: Fracture is noted at the right elbow.  The distal humerus projects anteriorly relative to the radius and ulna. It is difficult to evaluate for fracture on these limited views of the elbow.  There is a comminuted distal right radial fracture.  Mild impaction.  Displacement fracture fragments.  No visible distal ulnar fracture.  IMPRESSION: Dislocation at the right elbow.  It is difficult to exclude fracture of the elbow on these projections.  Consider dedicated elbow series.  Comminuted, displaced and impacted distal right radial fracture.   Original Report Authenticated By: Charlett Nose, M.D.    Ct Head Wo Contrast  07/11/2012  *RADIOLOGY REPORT*  Clinical Data:  15 feet fall from ladder  CT HEAD WITHOUT CONTRAST CT CERVICAL SPINE WITHOUT CONTRAST  Technique:  Multidetector CT imaging of the head and cervical spine was performed following the standard protocol without intravenous contrast.  Multiplanar CT image reconstructions of the cervical spine were also generated.  Comparison:  None.  CT HEAD  Findings: No evidence of parenchymal hemorrhage or extra-axial fluid collection. No mass lesion, mass effect, or midline shift.  No CT evidence of acute infarction.  Cerebral volume is age appropriate.  No ventriculomegaly.  The visualized paranasal sinuses are essentially clear. The mastoid air cells are  unopacified.  No evidence of calvarial fracture.  IMPRESSION: No evidence of acute intracranial abnormality.  CT CERVICAL SPINE  Findings: Straightening of cervical spine.  No evidence of fracture or dislocation.  Vertebral body heights are maintained.  The dens appears intact.  No prevertebral soft tissue swelling.  Mild multilevel degenerative changes.  Visualized thyroid is unremarkable.  Visualized lung apices are clear.  IMPRESSION: No evidence of traumatic injury to the cervical spine.  Mild multilevel degenerative changes.   Original Report Authenticated By: Charline Bills, M.D.    Ct Chest W Contrast  07/11/2012  *RADIOLOGY REPORT*  Clinical Data:  15 feet fall from ladder  CT CHEST, ABDOMEN AND PELVIS WITH CONTRAST  Technique:  Multidetector CT imaging of the chest, abdomen and pelvis was performed following the standard protocol during bolus administration of intravenous contrast.  Contrast:  100 ml Omnipaque-300 IV  Comparison:  None.  CT CHEST  Findings:  No evidence of acute aortic injury.  No mediastinal hematoma.  Mild dependent atelectasis lung bases.  No suspicious pulmonary nodules.  No pleural effusion or pneumothorax.  Visualized thyroid is unremarkable.  The heart is normal in size.  No pericardial effusion.  No suspicious mediastinal, hilar, or axillary lymphadenopathy.  Mild degenerative changes of the thoracic spine.  No fracture is seen.  IMPRESSION: No evidence of traumatic injury to the chest.  CT ABDOMEN AND PELVIS  Findings:  Liver, spleen, pancreas, and adrenal glands are within normal limits.  Gallbladder is unremarkable.  No intrahepatic or extrahepatic ductal dilatation.  2 mm nonobstructing the left upper pole renal calculus (series 2/image 70).  3 mm nonobstructing left lower pole renal calculus (series 2/image 77).  Right kidney is within normal limits.  No hydronephrosis.  No evidence of bowel obstruction.  Normal appendix.  Atherosclerotic calcifications of the abdominal  aorta and branch vessels.  No abdominopelvic ascites.  No free air.  Mild prostatomegaly, measuring 5.3 cm in transverse dimension, and notable for dystrophic calcifications.  Bladder is notable for indwelling Foley catheter.  Displaced fracture of the right iliac crest (series 2/image 101) extending to the superior and medial walls of the right acetabulum (series 2/images 117 and 125). Nondisplaced right inferior  pubic ramus fracture (series 2/image 131).  Associated small volume right retroperitoneal/pelvic hemorrhage (series 2/image 125).  Mild degenerative changes of the lumbar spine.  IMPRESSION: Displaced fracture of the right iliac crest with associated comminuted acetabular fracture.  Nondisplaced right inferior pubic ramus fracture.  Associated small volume right retroperitoneal/pelvic hemorrhage.   Original Report Authenticated By: Charline Bills, M.D.    Ct Cervical Spine Wo Contrast  07/11/2012  *RADIOLOGY REPORT*  Clinical Data:  15 feet fall from ladder  CT HEAD WITHOUT CONTRAST CT CERVICAL SPINE WITHOUT CONTRAST  Technique:  Multidetector CT imaging of the head and cervical spine was performed following the standard protocol without intravenous contrast.  Multiplanar CT image reconstructions of the cervical spine were also generated.  Comparison:  None.  CT HEAD  Findings: No evidence of parenchymal hemorrhage or extra-axial fluid collection. No mass lesion, mass effect, or midline shift.  No CT evidence of acute infarction.  Cerebral volume is age appropriate.  No ventriculomegaly.  The visualized paranasal sinuses are essentially clear. The mastoid air cells are unopacified.  No evidence of calvarial fracture.  IMPRESSION: No evidence of acute intracranial abnormality.  CT CERVICAL SPINE  Findings: Straightening of cervical spine.  No evidence of fracture or dislocation.  Vertebral body heights are maintained.  The dens appears intact.  No prevertebral soft tissue swelling.  Mild multilevel  degenerative changes.  Visualized thyroid is unremarkable.  Visualized lung apices are clear.  IMPRESSION: No evidence of traumatic injury to the cervical spine.  Mild multilevel degenerative changes.   Original Report Authenticated By: Charline Bills, M.D.    Ct Abdomen Pelvis W Contrast  07/11/2012  *RADIOLOGY REPORT*  Clinical Data:  15 feet fall from ladder  CT CHEST, ABDOMEN AND PELVIS WITH CONTRAST  Technique:  Multidetector CT imaging of the chest, abdomen and pelvis was performed following the standard protocol during bolus administration of intravenous contrast.  Contrast:  100 ml Omnipaque-300 IV  Comparison:  None.  CT CHEST  Findings:  No evidence of acute aortic injury.  No mediastinal hematoma.  Mild dependent atelectasis lung bases.  No suspicious pulmonary nodules.  No pleural effusion or pneumothorax.  Visualized thyroid is unremarkable.  The heart is normal in size.  No pericardial effusion.  No suspicious mediastinal, hilar, or axillary lymphadenopathy.  Mild degenerative changes of the thoracic spine.  No fracture is seen.  IMPRESSION: No evidence of traumatic injury to the chest.  CT ABDOMEN AND PELVIS  Findings:  Liver, spleen, pancreas, and adrenal glands are within normal limits.  Gallbladder is unremarkable.  No intrahepatic or extrahepatic ductal dilatation.  2 mm nonobstructing the left upper pole renal calculus (series 2/image 70).  3 mm nonobstructing left lower pole renal calculus (series 2/image 77).  Right kidney is within normal limits.  No hydronephrosis.  No evidence of bowel obstruction.  Normal appendix.  Atherosclerotic calcifications of the abdominal aorta and branch vessels.  No abdominopelvic ascites.  No free air.  Mild prostatomegaly, measuring 5.3 cm in transverse dimension, and notable for dystrophic calcifications.  Bladder is notable for indwelling Foley catheter.  Displaced fracture of the right iliac crest (series 2/image 101) extending to the superior and medial  walls of the right acetabulum (series 2/images 117 and 125). Nondisplaced right inferior pubic ramus fracture (series 2/image 131).  Associated small volume right retroperitoneal/pelvic hemorrhage (series 2/image 125).  Mild degenerative changes of the lumbar spine.  IMPRESSION: Displaced fracture of the right iliac crest with associated comminuted acetabular fracture.  Nondisplaced right inferior pubic ramus fracture.  Associated small volume right retroperitoneal/pelvic hemorrhage.   Original Report Authenticated By: Charline Bills, M.D.    Dg Pelvis Portable  07/11/2012  *RADIOLOGY REPORT*  Clinical Data: Fall, right hip pain.  PORTABLE PELVIS  Comparison: None.  Findings: Cortical disruption and irregularity noted in the region of the right ischium and medial acetabulum compatible with acetabular fracture.  No proximal femoral fracture.  SI joints are symmetric and unremarkable.  IMPRESSION: Right medial acetabular fracture.   Original Report Authenticated By: Charlett Nose, M.D.    Dg Chest Portable 1 View  07/11/2012  *RADIOLOGY REPORT*  Clinical Data: Trauma, fall  PORTABLE CHEST - 1 VIEW  Comparison: None.  Findings: Lungs are clear. No pleural effusion or pneumothorax.  Cardiomediastinal silhouette is within normal limits.  IMPRESSION: No evidence of acute cardiopulmonary disease.   Original Report Authenticated By: Charline Bills, M.D.     Review of Systems  Constitutional: Negative for fever and chills.       Not hungry  HENT: Negative for tinnitus.   Eyes: Negative for blurred vision.  Respiratory: Negative for shortness of breath and wheezing.   Cardiovascular: Negative for chest pain and palpitations.  Gastrointestinal: Negative for nausea, vomiting, abdominal pain and diarrhea.       + flatus  Genitourinary:       Foley  Musculoskeletal:       R UEx pain  R hip pain  Neurological: Negative for tingling, sensory change, focal weakness and headaches.   Blood pressure 126/73,  pulse 109, temperature 99.5 F (37.5 C), temperature source Oral, resp. rate 18, height 5\' 9"  (1.753 m), weight 102.059 kg (225 lb), SpO2 96.00%. Physical Exam  Constitutional: He is oriented to person, place, and time. He appears well-developed and well-nourished. He is cooperative. No distress.  Cardiovascular:  s1 and s2, tachy but reg  Respiratory:  Clear bilaterally  GI:  Soft, NT, + BS  Genitourinary:  foley  Musculoskeletal:  Right Upper Extremity     Splinted    Swelling noted     Ext warm   R arm per Hand  Left Upper Extremity    No acute findings    Motor and sensory functions intact    Ext warm    + Radial pulse     Left Lower Extremity     Hip, knee, ankle unremarkable    No tenderness to palpation of thigh and lower leg or foot    Distal motor and sensor functions intact    Ext warm    + DP pulse    No swelling    SCD in place    No pain with ranging of hip, knee or ankle     Right Lower Extremity    Traction to R distal femr (steinmann pin), 15 lbs applied, in line skeletal txn    Tibia and ankle nontender, foot nontender    Soft tissue about R hip and pelvis is stable    No open wounds or lesions    DPN, SPN, TN sensation intact    EHL, FHL, AT, PT, peroneals, gastroc motor intact    + DP pulse    Swelling stable    Compartments soft and NT    Neurological: He is alert and oriented to person, place, and time. No sensory deficit.  Psychiatric: He has a normal mood and affect. His speech is normal and behavior is normal. Cognition and memory are normal.    Assessment/Plan:  62 y/o male s/p fall   1. Fall 2. R anterior column/anterior wall acetabulum fx: OTA 62-A3  Pt will need surgical fixation of his acetabulum to restore alignment and joint surface congruity   Ordered plain films today -AP and judets  Iliac wing and rami fxs are part of the constellation that makes up this acetabulum fx  Plan for OR tomorrow   He is at increased risk for  post-traumatic arthritis, pain and motion loss given the severity of the injury  I did discuss with him the possibility of XRT post op as well for HO prophylaxis and we will make the determination as to the need for XRT post op  Pt will be TDWB x 8 weeks post op, his ability to mobilize is further complicated by his R arm injury and thus he will likely be bed to chair for 6-8 weeks  Pt will have hip precautions post op as well for 3 months    Pt at increased risk for ileus given amount of trauma, location of trauma, and immobility 3. R distal humerus fx and R distal radius fx  Per Dr. Mina Marble 4. ABL anemia  Has been transfused with 2 units already  Will check cbc in am 5. DVT/PE prophylaxis  D/c'd lovenox for now  Continue with scds   Will convert to coumadin post op and tx for 8 weeks 6. FEN  Diet for now  NPO after MN 7. Dispo  OR tomorrow for ORIF R acetabulum  Mearl Latin, PA-C [facility] [phone] (P) 07/13/2012, 11:09 AM

## 2012-07-13 NOTE — Clinical Social Work Psychosocial (Signed)
     Clinical Social Work Department BRIEF PSYCHOSOCIAL ASSESSMENT 07/13/2012  Patient:  Roger Palmer, Roger Palmer     Account Number:  [id number]     Admit date:  07/11/2012  Clinical Social Worker:  Verl Blalock  Date/Time:  07/13/2012 11:00 AM  Referred by:  Physician  Date Referred:  07/13/2012 Referred for  Psychosocial assessment   Other Referral:   Interview type:  Patient Other interview type:   Patient daughter at bedside    PSYCHOSOCIAL DATA Living Status:  WIFE Admitted from facility:   Level of care:   Primary support name:  Roger Palmer, Roger Palmer   [phone] Primary support relationship to patient:  SPOUSE Degree of support available:   Strong    CURRENT CONCERNS Current Concerns  None Noted   Other Concerns:    SOCIAL WORK ASSESSMENT / PLAN Clinical Social Worker met with patient and patient daughter at bedside to offer support and discuss patient needs at discharge.  Patient states that he was at his home cutting limbs out of a tree about 15 feet off the ground. Patient fell from the top of the ladder to the ground. Patient currently lives at home with his wife and has every intention to return home once medically ready.  Patient wife is able to be present at home 24 hours a day for assistance as needed once patient is discharged.  Patient seemed realistic regarding return home with possible rehab needs.  Patient and his wife live in a ranch style home with all bedrooms on the first floor.  Patient brother is planning to build him a ramp at their home once the weather clears up.  Patient with a lot of support in the area and understands that following surgery tomorrow he will continue his hospitalization for about a week prior to discussing official discharge plans.    Clinical Social Worker inquired about possible substance use.  Patient states that he was not drinking any type of alcohol at the time of the accident.  Patient stated, "I'm crazy, but not that crazy."   Patient expressed no concerns regarding current use.  SBIRT complete.  No resources needed at this time.  CSW signing off due to no further social work needs, however remains available for emotional support as needed.  Please reconsult if further social work needs arise.   Assessment/plan status:  No Further Intervention Required Other assessment/ plan:   Information/referral to community resources:   Patient plans to discuss with family about potential helpful resources once discharged and notify CSW of further needs.  CSW will notify inpatient rehab of patient potential pending progress following surgery.    PATIENTS/FAMILYS RESPONSE TO PLAN OF CARE: Patient alert and oriented x3 laying in the bed with daughter at bedside.  Patient with a very good attitude about his current situation.  Patient seems a little anxious regarding surgery tomorrow, with the ultimate goal of getting home.  Patient with strong family support and will have 24 hour assistance at home once medically able to return.  Patient verbalized his appreciation for CSW support and concern.

## 2012-07-13 NOTE — Progress Notes (Signed)
UR completed 

## 2012-07-13 NOTE — Progress Notes (Signed)
    Subjective: 2 Days Post-Op Procedure(s) (LRB): OPEN REDUCTION INTERNAL FIXATION (ORIF) DISTAL RADIAL FRACTURE (Right) OPEN REDUCTION INTERNAL FIXATION (ORIF) RADIAL head FRACTURE (Right) Patient reports pain as 2 on 0-10 scale.   Denies CP or SOB.  Voiding without difficulty. Positive flatus. Objective: Vital signs in last 24 hours: Temp:  [98 F (36.7 C)-99.5 F (37.5 C)] 99.5 F (37.5 C) (03/03 0748) Pulse Rate:  [103-115] 110 (03/03 0433) Resp:  [10-20] 14 (03/03 0433) BP: (117-141)/(60-76) 141/72 mmHg (03/03 0433) SpO2:  [94 %-98 %] 97 % (03/03 0433)  Intake/Output from previous day: 03/02 0701 - 03/03 0700 In: 4098 [P.O.:1560; I.V.:2438; IV Piggyback:100] Out: 1700 [Urine:1700] Intake/Output this shift:    Labs:  Recent Labs  07/11/12 1728 07/11/12 1756 07/11/12 2029 07/12/12 0037 07/12/12 0610  HGB 11.0* 10.9* 8.8* 10.8* 10.8*    Recent Labs  07/11/12 1728  07/12/12 0037 07/12/12 0610  WBC 22.8*  --   --  12.4*  RBC 3.62*  --   --  3.52*  HCT 32.2*  < > 31.4* 31.2*  PLT 221  --   --  183  < > = values in this interval not displayed.  Recent Labs  07/11/12 1728 07/11/12 1756 07/11/12 2029 07/12/12 0610  NA 141 145 146* 143  K 2.9* 3.2* 3.7 3.6  CL 107 111  --  109  CO2 20  --   --  24  BUN 18 17  --  15  CREATININE 1.28 1.30  --  1.14  GLUCOSE 204* 206* 181* 201*  CALCIUM 8.5  --   --  8.4    Recent Labs  07/11/12 1728  INR 1.13    Physical Exam: Neurologically intact ABD soft Compartment soft  Assessment/Plan: 2 Days Post-Op Procedure(s) (LRB): OPEN REDUCTION INTERNAL FIXATION (ORIF) DISTAL RADIAL FRACTURE (Right) OPEN REDUCTION INTERNAL FIXATION (ORIF) RADIAL head FRACTURE (Right) Dr Carola Frost to eval patient today and determine definitive fx management for acetabular injury.  Venita Lick D for Dr. Venita Lick [facility] [phone] 07/13/2012, 7:56 AM

## 2012-07-14 ENCOUNTER — Encounter (HOSPITAL_COMMUNITY): Payer: Self-pay | Admitting: Anesthesiology

## 2012-07-14 ENCOUNTER — Inpatient Hospital Stay (HOSPITAL_COMMUNITY): Payer: 59 | Admitting: Anesthesiology

## 2012-07-14 ENCOUNTER — Inpatient Hospital Stay (HOSPITAL_COMMUNITY): Payer: 59

## 2012-07-14 ENCOUNTER — Encounter (HOSPITAL_COMMUNITY): Admission: EM | Disposition: A | Discharge status: Rehabilitation Facility | Payer: Self-pay | Source: Home / Self Care

## 2012-07-14 HISTORY — PX: ORIF ACETABULAR FRACTURE: SHX5029

## 2012-07-14 LAB — BLOOD GAS, ARTERIAL
Bicarbonate: 23.7 mEq/L (ref 20.0–24.0)
PEEP: 5 cmH2O
Patient temperature: 97.1
TCO2: 25.4 mmol/L (ref 0–100)
pH, Arterial: 7.275 — ABNORMAL LOW (ref 7.350–7.450)
pO2, Arterial: 60.5 mmHg — ABNORMAL LOW (ref 80.0–100.0)

## 2012-07-14 LAB — POCT I-STAT 3, ART BLOOD GAS (G3+)
O2 Saturation: 86 %
Patient temperature: 97.6
pCO2 arterial: 49.9 mmHg — ABNORMAL HIGH (ref 35.0–45.0)
pH, Arterial: 7.319 — ABNORMAL LOW (ref 7.350–7.450)

## 2012-07-14 LAB — CBC WITH DIFFERENTIAL/PLATELET
Basophils Absolute: 0 10*3/uL (ref 0.0–0.1)
Eosinophils Relative: 0 % (ref 0–5)
HCT: 30.6 % — ABNORMAL LOW (ref 39.0–52.0)
Hemoglobin: 10.7 g/dL — ABNORMAL LOW (ref 13.0–17.0)
Lymphocytes Relative: 7 % — ABNORMAL LOW (ref 12–46)
Lymphs Abs: 1 10*3/uL (ref 0.7–4.0)
MCV: 87.7 fL (ref 78.0–100.0)
Monocytes Absolute: 1.5 10*3/uL — ABNORMAL HIGH (ref 0.1–1.0)
Monocytes Relative: 11 % (ref 3–12)
RDW: 13.2 % (ref 11.5–15.5)
WBC: 13.2 10*3/uL — ABNORMAL HIGH (ref 4.0–10.5)

## 2012-07-14 LAB — GLUCOSE, CAPILLARY
Glucose-Capillary: 202 mg/dL — ABNORMAL HIGH (ref 70–99)
Glucose-Capillary: 206 mg/dL — ABNORMAL HIGH (ref 70–99)
Glucose-Capillary: 165 mg/dL — ABNORMAL HIGH (ref 70–99)
Glucose-Capillary: 242 mg/dL — ABNORMAL HIGH (ref 70–99)

## 2012-07-14 LAB — BASIC METABOLIC PANEL
BUN: 10 mg/dL (ref 6–23)
CO2: 25 mEq/L (ref 19–32)
Calcium: 8.6 mg/dL (ref 8.4–10.5)
Creatinine, Ser: 0.91 mg/dL (ref 0.50–1.35)
Glucose, Bld: 207 mg/dL — ABNORMAL HIGH (ref 70–99)

## 2012-07-14 SURGERY — OPEN REDUCTION INTERNAL FIXATION (ORIF) ACETABULAR FRACTURE
Anesthesia: General | Site: Pelvis | Laterality: Right | Wound class: Clean

## 2012-07-14 MED ORDER — SODIUM CHLORIDE 0.9 % IR SOLN
Status: DC | PRN
  Administered 2012-07-14: 3000 mL

## 2012-07-14 MED ORDER — LACTATED RINGERS IV SOLN: INTRAVENOUS | Status: DC

## 2012-07-14 MED ORDER — METFORMIN HCL 500 MG PO TABS
500.0000 mg | ORAL_TABLET | Freq: Every day | ORAL | Status: DC
  Filled 2012-07-14 (×2): qty 1

## 2012-07-14 MED ORDER — PHENYLEPHRINE HCL 10 MG/ML IJ SOLN
INTRAMUSCULAR | Status: DC | PRN
  Administered 2012-07-14 (×12): 40 ug via INTRAVENOUS

## 2012-07-14 MED ORDER — POTASSIUM CHLORIDE 10 MEQ/100ML IV SOLN
10.0000 meq | INTRAVENOUS | Status: AC
  Administered 2012-07-14 (×4): 10 meq via INTRAVENOUS
  Filled 2012-07-14: qty 400

## 2012-07-14 MED ORDER — LACTATED RINGERS IV SOLN
INTRAVENOUS | Status: DC | PRN
  Administered 2012-07-14 (×2): via INTRAVENOUS

## 2012-07-14 MED ORDER — PHENYLEPHRINE HCL 10 MG/ML IJ SOLN
10.0000 mg | INTRAVENOUS | Status: DC | PRN
  Administered 2012-07-14: 20 ug/min via INTRAVENOUS

## 2012-07-14 MED ORDER — MIDAZOLAM HCL 5 MG/5ML IJ SOLN
INTRAMUSCULAR | Status: DC | PRN
  Administered 2012-07-14: 1 mg via INTRAVENOUS
  Administered 2012-07-14 (×2): 2 mg via INTRAVENOUS

## 2012-07-14 MED ORDER — ARTIFICIAL TEARS OP OINT
TOPICAL_OINTMENT | OPHTHALMIC | Status: DC | PRN
  Administered 2012-07-14: 1 via OPHTHALMIC

## 2012-07-14 MED ORDER — FENTANYL CITRATE 0.05 MG/ML IJ SOLN
INTRAMUSCULAR | Status: DC | PRN
  Administered 2012-07-14 (×3): 50 ug via INTRAVENOUS
  Administered 2012-07-14: 200 ug via INTRAVENOUS
  Administered 2012-07-14: 50 ug via INTRAVENOUS

## 2012-07-14 MED ORDER — LIDOCAINE HCL (CARDIAC) 20 MG/ML IV SOLN
INTRAVENOUS | Status: DC | PRN
  Administered 2012-07-14: 80 mg via INTRAVENOUS

## 2012-07-14 MED ORDER — LACTATED RINGERS IV SOLN
INTRAVENOUS | Status: DC | PRN
  Administered 2012-07-14 (×3): via INTRAVENOUS

## 2012-07-14 MED ORDER — PROPOFOL 10 MG/ML IV BOLUS
INTRAVENOUS | Status: DC | PRN
  Administered 2012-07-14: 200 mg via INTRAVENOUS

## 2012-07-14 MED ORDER — PROMETHAZINE HCL 25 MG/ML IJ SOLN
12.5000 mg | INTRAMUSCULAR | Status: DC | PRN
  Filled 2012-07-14: qty 1

## 2012-07-14 MED ORDER — ALBUMIN HUMAN 5 % IV SOLN
INTRAVENOUS | Status: DC | PRN
  Administered 2012-07-14 (×4): via INTRAVENOUS

## 2012-07-14 MED ORDER — ONDANSETRON HCL 4 MG/2ML IJ SOLN
INTRAMUSCULAR | Status: DC | PRN
  Administered 2012-07-14: 4 mg via INTRAVENOUS

## 2012-07-14 MED ORDER — 0.9 % SODIUM CHLORIDE (POUR BTL) OPTIME
TOPICAL | Status: DC | PRN
  Administered 2012-07-14: 1000 mL

## 2012-07-14 MED ORDER — VECURONIUM BROMIDE 10 MG IV SOLR
INTRAVENOUS | Status: DC | PRN
  Administered 2012-07-14: 2 mg via INTRAVENOUS
  Administered 2012-07-14: 3 mg via INTRAVENOUS
  Administered 2012-07-14 (×3): 2 mg via INTRAVENOUS
  Administered 2012-07-14: 4 mg via INTRAVENOUS
  Administered 2012-07-14: 3 mg via INTRAVENOUS
  Administered 2012-07-14: 2 mg via INTRAVENOUS

## 2012-07-14 MED ORDER — METOPROLOL TARTRATE 1 MG/ML IV SOLN
INTRAVENOUS | Status: DC | PRN
  Administered 2012-07-14: 2 mg via INTRAVENOUS

## 2012-07-14 MED ORDER — SODIUM CHLORIDE 0.9 % IJ SOLN
INTRAMUSCULAR | Status: AC
  Filled 2012-07-14: qty 10

## 2012-07-14 MED ORDER — ROCURONIUM BROMIDE 100 MG/10ML IV SOLN
INTRAVENOUS | Status: DC | PRN
  Administered 2012-07-14 (×2): 50 mg via INTRAVENOUS

## 2012-07-14 SURGICAL SUPPLY — 83 items
3.5mm Cortex Screw Self Tapping X 100mm ×2 IMPLANT
3.5mm Cortex Screw Self Tapping X 44mm ×2 IMPLANT
3.5mm Cortex Screw Self Tapping X 55mm ×2 IMPLANT
3.5mm Cortex Screw Self Tapping X 70mm ×2 IMPLANT
3.5mm Cortex Screw Self Tapping X 90mm ×2 IMPLANT
3.5mm Low Profile Reconstruction J-Plate (88mm Rad ×2 IMPLANT
APPLIER CLIP 11 MED OPEN (CLIP) ×2
BIOPATCH RED 1 DISK 7.0 (GAUZE/BANDAGES/DRESSINGS) ×2 IMPLANT
BIT DRILL 2.5X300 (BIT) ×1 IMPLANT
BIT DRILL FLUTED 2.5 (BIT) ×1 IMPLANT
BIT DRILL QC 3.5X195 (BIT) ×2 IMPLANT
BLADE SURG ROTATE 9660 (MISCELLANEOUS) IMPLANT
BRUSH SCRUB DISP (MISCELLANEOUS) ×4 IMPLANT
CANISTER SUCTION 2500CC (MISCELLANEOUS) ×2 IMPLANT
CLIP APPLIE 11 MED OPEN (CLIP) ×1 IMPLANT
CLOTH BEACON ORANGE TIMEOUT ST (SAFETY) ×2 IMPLANT
COVER SURGICAL LIGHT HANDLE (MISCELLANEOUS) ×2 IMPLANT
DRAIN CHANNEL 10F 3/8 F FF (DRAIN) IMPLANT
DRAIN CHANNEL 15F RND FF W/TCR (WOUND CARE) ×2 IMPLANT
DRAPE C-ARM 42X72 X-RAY (DRAPES) ×2 IMPLANT
DRAPE C-ARMOR (DRAPES) ×2 IMPLANT
DRAPE INCISE IOBAN 66X45 STRL (DRAPES) IMPLANT
DRAPE INCISE IOBAN 85X60 (DRAPES) IMPLANT
DRAPE LAPAROSCOPIC ABDOMINAL (DRAPES) ×2 IMPLANT
DRAPE ORTHO SPLIT 77X108 STRL (DRAPES)
DRAPE SURG ORHT 6 SPLT 77X108 (DRAPES) IMPLANT
DRAPE U-SHAPE 47X51 STRL (DRAPES) ×2 IMPLANT
DRILL BIT 2.5X300 (BIT) ×1
DRILL BIT FLUTED 2.5 (BIT) ×2
DRSG ADAPTIC 3X8 NADH LF (GAUZE/BANDAGES/DRESSINGS) IMPLANT
DRSG MEPILEX BORDER 4X8 (GAUZE/BANDAGES/DRESSINGS) ×4 IMPLANT
DRSG PAD ABDOMINAL 8X10 ST (GAUZE/BANDAGES/DRESSINGS) IMPLANT
ELECT BLADE 6.5 EXT (BLADE) IMPLANT
ELECT REM PT RETURN 9FT ADLT (ELECTROSURGICAL) ×2
ELECTRODE REM PT RTRN 9FT ADLT (ELECTROSURGICAL) ×1 IMPLANT
EVACUATOR 1/8 PVC DRAIN (DRAIN) IMPLANT
EVACUATOR SILICONE 100CC (DRAIN) ×2 IMPLANT
GAUZE SPONGE 4X4 16PLY XRAY LF (GAUZE/BANDAGES/DRESSINGS) ×2 IMPLANT
GLOVE BIO SURGEON STRL SZ 6.5 (GLOVE) ×4 IMPLANT
GLOVE BIO SURGEON STRL SZ7.5 (GLOVE) ×4 IMPLANT
GLOVE BIO SURGEON STRL SZ8 (GLOVE) ×4 IMPLANT
GLOVE BIOGEL PI IND STRL 7.5 (GLOVE) ×2 IMPLANT
GLOVE BIOGEL PI IND STRL 8 (GLOVE) ×1 IMPLANT
GLOVE BIOGEL PI INDICATOR 7.5 (GLOVE) ×2
GLOVE BIOGEL PI INDICATOR 8 (GLOVE) ×1
GOWN PREVENTION PLUS XLARGE (GOWN DISPOSABLE) ×2 IMPLANT
GOWN PREVENTION PLUS XXLARGE (GOWN DISPOSABLE) IMPLANT
GOWN STRL NON-REIN LRG LVL3 (GOWN DISPOSABLE) ×6 IMPLANT
HANDPIECE INTERPULSE COAX TIP (DISPOSABLE) ×1
KIT BASIN OR (CUSTOM PROCEDURE TRAY) ×2 IMPLANT
KIT ROOM TURNOVER OR (KITS) ×2 IMPLANT
LIGHT ORTHO (MISCELLANEOUS) ×2 IMPLANT
LOOP VESSEL MAXI BLUE (MISCELLANEOUS) IMPLANT
MANIFOLD NEPTUNE II (INSTRUMENTS) IMPLANT
NEEDLE MAYO TROCAR (NEEDLE) IMPLANT
NS IRRIG 1000ML POUR BTL (IV SOLUTION) ×2 IMPLANT
ORTHO LIGHT ×2 IMPLANT
PACK TOTAL JOINT (CUSTOM PROCEDURE TRAY) ×2 IMPLANT
PAD ARMBOARD 7.5X6 YLW CONV (MISCELLANEOUS) ×2 IMPLANT
RETRIEVER SUT HEWSON (MISCELLANEOUS) IMPLANT
SCREW PELVIC CORT ST 3.5X55 (Screw) ×1 IMPLANT
SCREW PELVIC CORTEX 50MM (Screw) ×4 IMPLANT
SCREW SELF TAP 3.5 55M (Screw) ×1 IMPLANT
SCREW SHANZ 5.0X175MM (Screw) ×2 IMPLANT
SET HNDPC FAN SPRY TIP SCT (DISPOSABLE) ×1 IMPLANT
SPONGE GAUZE 4X4 12PLY (GAUZE/BANDAGES/DRESSINGS) IMPLANT
SPONGE LAP 18X18 X RAY DECT (DISPOSABLE) IMPLANT
STAPLER VISISTAT 35W (STAPLE) ×2 IMPLANT
STRIP CLOSURE SKIN 1/2X4 (GAUZE/BANDAGES/DRESSINGS) IMPLANT
SUCTION FRAZIER TIP 10 FR DISP (SUCTIONS) ×2 IMPLANT
SUT ETHILON 3 0 PS 1 (SUTURE) ×6 IMPLANT
SUT FIBERWIRE #2 38 T-5 BLUE (SUTURE)
SUT VIC AB 0 CT1 27 (SUTURE) ×2
SUT VIC AB 0 CT1 27XBRD ANBCTR (SUTURE) ×2 IMPLANT
SUT VIC AB 1 CT1 18XCR BRD 8 (SUTURE) ×1 IMPLANT
SUT VIC AB 1 CT1 8-18 (SUTURE) ×1
SUT VIC AB 2-0 CT1 27 (SUTURE) ×3
SUT VIC AB 2-0 CT1 TAPERPNT 27 (SUTURE) ×3 IMPLANT
SUTURE FIBERWR #2 38 T-5 BLUE (SUTURE) IMPLANT
TOWEL OR 17X24 6PK STRL BLUE (TOWEL DISPOSABLE) ×2 IMPLANT
TOWEL OR 17X26 10 PK STRL BLUE (TOWEL DISPOSABLE) ×4 IMPLANT
TRAY FOLEY CATH 14FR (SET/KITS/TRAYS/PACK) IMPLANT
WATER STERILE IRR 1000ML POUR (IV SOLUTION) IMPLANT

## 2012-07-14 NOTE — Progress Notes (Signed)
Inpatient Diabetes Program Recommendations  AACE/ADA: New Consensus Statement on Inpatient Glycemic Control (2013)  Target Ranges:  Prepandial:   less than 140 mg/dL      Peak postprandial:   less than 180 mg/dL (1-2 hours)      Critically ill patients:  140 - 180 mg/dL    Results for YVES, FODOR (MRN [id number]) as of 07/14/2012 09:01  Ref. Range 07/12/2012 23:16 07/13/2012 04:30 07/13/2012 07:54 07/13/2012 15:42 07/13/2012 19:43  Glucose-Capillary Latest Range: 70-99 mg/dL 409 (H) 811 (H) 914 (H) 181 (H) 206 (H)   Results for MERLYN, CONLEY (MRN [id number]) as of 07/14/2012 09:01  Ref. Range 07/13/2012 23:18 07/14/2012 03:22 07/14/2012 07:48  Glucose-Capillary Latest Range: 70-99 mg/dL 086 (H) 578 (H) 469 (H)   Patient having some fasting and afternoon glucose elevations.  May want to add low dose basal insulin while patient in hospital to better control CBGs.  Inpatient Diabetes Program Recommendations Insulin - Basal: Please consider adding low dose Lantus 10 units QHS while patient in hospital.  Note: Will follow. Ambrose Finland RN, MSN, CDE Diabetes Coordinator Inpatient Diabetes Program [phone]

## 2012-07-14 NOTE — Brief Op Note (Signed)
07/11/2012 - 07/14/2012  8:32 PM  PATIENT:  Roger Palmer  62 y.o. male  PRE-OPERATIVE DIAGNOSIS:  RIGHT ACETABULUM FRACTURE  POST-OPERATIVE DIAGNOSIS:  RIGHT ACETABULUM FRACTURE  PROCEDURE:  Procedure(s): OPEN REDUCTION INTERNAL FIXATION (ORIF) ACETABULAR FRACTURE (Right) anterior column posterior hemitransverse pattern   SURGEON:  Surgeon(s) and Role:    * Budd Palmer, MD - Primary  PHYSICIAN ASSISTANT: Montez Morita, [facility]  ANESTHESIA:   general  EBL:  Total I/O In: 1500 [I.V.:1000; IV Piggyback:500] Out: 275 [Urine:75; Blood:200]  BLOOD ADMINISTERED:none  DRAINS: (1) Jackson-Pratt drain(s) with closed bulb suction in the pelvis   LOCAL MEDICATIONS USED:  NONE  SPECIMEN:  No Specimen  DISPOSITION OF SPECIMEN:  N/A  COUNTS:  YES  TOURNIQUET:  * No tourniquets in log *  DICTATION: .Other Dictation: Dictation Number [id number]  PLAN OF CARE: Admit to inpatient   PATIENT DISPOSITION:  PACU - hemodynamically stable.   Delay start of Pharmacological VTE agent (>24hrs) due to surgical blood loss or risk of bleeding: no

## 2012-07-14 NOTE — Preoperative (Signed)
Beta Blockers   Reason not to administer Beta Blockers:Not Applicable 

## 2012-07-14 NOTE — Consult Note (Signed)
I have seen and examined the patient. I agree with the findings above.  R anterior column fracture with quadrilateral plate fracture and no distal neurovascular deficits.  I discussed with the patient the risks and benefits of surgery of repai, including the possibility of infection, nerve injury, vessel injury, wound breakdown, arthritis, symptomatic hardware, DVT/ PE, loss of motion, and need for further surgery among others.  We also specifically discussed urologic injury and sexual dysfunction.  He understood these risks and wished to proceed.  Budd Palmer, MD 07/14/2012 3:36 PM

## 2012-07-14 NOTE — Anesthesia Postprocedure Evaluation (Signed)
  Anesthesia Post-op Note  Patient: Roger Palmer  Procedure(s) Performed: Procedure(s): OPEN REDUCTION INTERNAL FIXATION (ORIF) ACETABULAR FRACTURE (Right)  Patient Location: ICU  Anesthesia Type:General  Level of Consciousness: Patient remains intubated per anesthesia plan  Airway and Oxygen Therapy: Patient remains intubated per anesthesia plan and Patient placed on Ventilator (see vital sign flow sheet for setting)  Post-op Pain: none  Post-op Assessment: Post-op Vital signs reviewed, Patient's Cardiovascular Status Stable, Respiratory Function Stable, Patent Airway, No signs of Nausea or vomiting and Pain level controlled  Post-op Vital Signs: stable  Complications: No apparent anesthesia complications

## 2012-07-14 NOTE — Anesthesia Preprocedure Evaluation (Addendum)
Anesthesia Evaluation  Patient identified by MRN, date of birth, ID band Patient awake  General Assessment Comment:Patient brought in the trauma service after falling form a tree.History noted.  Reviewed: Allergy & Precautions, H&P , NPO status , Patient's Chart, lab work & pertinent test results  Airway Mallampati: II      Dental   Pulmonary neg pulmonary ROS,  breath sounds clear to auscultation        Cardiovascular hypertension, Rhythm:Regular Rate:Normal     Neuro/Psych    GI/Hepatic negative GI ROS, Neg liver ROS, GERD-  ,History of gastritis   Endo/Other  diabetes  Renal/GU Renal disease     Musculoskeletal   Abdominal   Peds  Hematology   Anesthesia Other Findings   Reproductive/Obstetrics                         Anesthesia Physical Anesthesia Plan  ASA: III  Anesthesia Plan: General   Post-op Pain Management:    Induction: Intravenous  Airway Management Planned: Oral ETT  Additional Equipment:   Intra-op Plan:   Post-operative Plan: Possible Post-op intubation/ventilation  Informed Consent: I have reviewed the patients History and Physical, chart, labs and discussed the procedure including the risks, benefits and alternatives for the proposed anesthesia with the patient or authorized representative who has indicated his/her understanding and acceptance.     Plan Discussed with: CRNA, Anesthesiologist and Surgeon  Anesthesia Plan Comments:         Anesthesia Quick Evaluation

## 2012-07-14 NOTE — Transfer of Care (Signed)
Immediate Anesthesia Transfer of Care Note  Patient: Roger Palmer  Procedure(s) Performed: Procedure(s): OPEN REDUCTION INTERNAL FIXATION (ORIF) ACETABULAR FRACTURE (Right)  Patient Location: ICU  Anesthesia Type:General  Level of Consciousness: Patient remains intubated per anesthesia plan  Airway & Oxygen Therapy: Patient remains intubated per anesthesia plan and Patient placed on Ventilator (see vital sign flow sheet for setting)  Post-op Assessment: Report given to PACU RN and Post -op Vital signs reviewed and stable  Post vital signs: Reviewed and stable  Complications: No apparent anesthesia complications

## 2012-07-14 NOTE — Progress Notes (Signed)
Patient ID: Roger Palmer, male   DOB: 15-Sep-1950, 62 y.o.   MRN: [id number] 3 Days Post-Op  Subjective: Some nausea but no emesis, had a BM this AM, not using PCA a lot  Objective: Vital signs in last 24 hours: Temp:  [98 F (36.7 C)-99.5 F (37.5 C)] 98.2 F (36.8 C) (03/04 0325) Pulse Rate:  [100-113] 107 (03/04 0325) Resp:  [12-30] 15 (03/04 0325) BP: (115-143)/(70-78) 133/76 mmHg (03/04 0325) SpO2:  [95 %-98 %] 98 % (03/04 0325) Last BM Date: 07/11/12  Intake/Output from previous day: 03/03 0701 - 03/04 0700 In: 2931.7 [P.O.:440; I.V.:2491.7] Out: 1725 [Urine:1725] Intake/Output this shift:    General appearance: alert and cooperative Eyes: PERL Resp: clear to auscultation bilaterally Cardio: regular rate and rhythm GI: soft, moderate distention but active BS, NT Extremities: RUE splint, fingers warm and moving, RLE skeletal traction, foot warm with palpable DP Neuro: A&O, F/C, see above  Lab Results: CBC   Recent Labs  07/13/12 1655 07/14/12 0335  WBC 12.3* 13.2*  HGB 11.0* 10.7*  HCT 31.1* 30.6*  PLT 148* 163   BMET  Recent Labs  07/13/12 1655 07/14/12 0335  NA 134* 138  K 3.1* 2.9*  CL 97 102  CO2 24 25  GLUCOSE 195* 207*  BUN 8 10  CREATININE 0.90 0.91  CALCIUM 8.8 8.6   PT/INR  Recent Labs  07/11/12 1728  LABPROT 14.3  INR 1.13   ABG No results found for this basename: PHART, PCO2, PO2, HCO3,  in the last 72 hours  Studies/Results: Dg Pelvis Comp Min 3v  07/13/2012  *RADIOLOGY REPORT*  Clinical Data: 62 year old male with comminuted right acetabular fracture, right iliac wing fracture.  JUDET PELVIS - 3+ VIEW  Comparison: Pelvis CT 07/11/2012 and earlier.  Findings: Portable views at 1131 hours.  Comminuted right acetabular fracture, nondisplaced right inferior pubic ramus fracture, and linear mildly distracted right iliac wing fracture appear not significantly changed from the recent CT.  Right femoral head remains normally located and  proximal right femur appears intact. Hardware related to traction identified.  Left hemi pelvis and proximal left femur appears stable intact.  IMPRESSION: Stable configuration of right iliac wing, comminuted right acetabular, and right inferior pubic ramus fracture since 07/11/2012.   Original Report Authenticated By: Erskine Speed, M.D.     Anti-infectives: Anti-infectives   Start     Dose/Rate Route Frequency Ordered Stop   07/12/12 0600  ceFAZolin (ANCEF) IVPB 1 g/50 mL premix     1 g 100 mL/hr over 30 Minutes Intravenous 3 times per day 07/11/12 2242 07/15/12 0559   07/11/12 1615  ceFAZolin (ANCEF) IVPB 1 g/50 mL premix     1 g 100 mL/hr over 30 Minutes Intravenous  Once 07/11/12 1609 07/11/12 1700      Assessment/Plan: s/p Procedure(s): OPEN REDUCTION INTERNAL FIXATION (ORIF) DISTAL RADIAL FRACTURE OPEN REDUCTION INTERNAL FIXATION (ORIF) RADIAL head FRACTURE S/p fall from tree on 07/11/12 Open right elbow dislocation/R distal radius fx - s/p ORIF of right wrist and elbow with radial head replacement and reconstruction of lateral elbow, per Dr. Mina Marble R acetabular fx/R iliac wing fx - skeletal traction per Dr. Shon Baton, to OR with Dr. Carola Frost today for ORIF R inf pubic ramus fx ABL anemia - stabilizing VTE - SCD's, Lovenox FEN - supplement K+, mild ileus Dispo -  PT/OT when cleared by ortho Continue 3300 today  LOS: 3 days    Violeta Gelinas, MD, MPH, FACS Pager: [phone]  07/14/2012

## 2012-07-14 NOTE — Anesthesia Procedure Notes (Addendum)
Procedures

## 2012-07-14 NOTE — Progress Notes (Signed)
paatient went to OR around 1pm via his bed. Wife at patient's side.Pca dilaudid D/C before leaving floor.

## 2012-07-15 ENCOUNTER — Inpatient Hospital Stay (HOSPITAL_COMMUNITY): Payer: 59

## 2012-07-15 DIAGNOSIS — J690 Pneumonitis due to inhalation of food and vomit: Secondary | ICD-10-CM

## 2012-07-15 LAB — CBC
HCT: 24.4 % — ABNORMAL LOW (ref 39.0–52.0)
Hemoglobin: 8.5 g/dL — ABNORMAL LOW (ref 13.0–17.0)
Hemoglobin: 6.4 g/dL — CL (ref 13.0–17.0)
MCH: 30.2 pg (ref 26.0–34.0)
MCH: 29.9 pg (ref 26.0–34.0)
MCH: 29.9 pg (ref 26.0–34.0)
MCHC: 34.8 g/dL (ref 30.0–36.0)
MCHC: 35 g/dL (ref 30.0–36.0)
MCHC: 35 g/dL (ref 30.0–36.0)
MCV: 86.8 fL (ref 78.0–100.0)
MCV: 85.5 fL (ref 78.0–100.0)
Platelets: 152 10*3/uL (ref 150–400)
RDW: 13.3 % (ref 11.5–15.5)

## 2012-07-15 LAB — BASIC METABOLIC PANEL
Calcium: 7.9 mg/dL — ABNORMAL LOW (ref 8.4–10.5)
Creatinine, Ser: 1.12 mg/dL (ref 0.50–1.35)
GFR calc non Af Amer: 69 mL/min — ABNORMAL LOW (ref >90)
Sodium: 140 mEq/L (ref 135–145)

## 2012-07-15 LAB — PROTIME-INR: Prothrombin Time: 15 seconds (ref 11.6–15.2)

## 2012-07-15 LAB — GLUCOSE, CAPILLARY
Glucose-Capillary: 150 mg/dL — ABNORMAL HIGH (ref 70–99)
Glucose-Capillary: 175 mg/dL — ABNORMAL HIGH (ref 70–99)
Glucose-Capillary: 241 mg/dL — ABNORMAL HIGH (ref 70–99)

## 2012-07-15 LAB — COMPREHENSIVE METABOLIC PANEL
Alkaline Phosphatase: 31 U/L — ABNORMAL LOW (ref 39–117)
BUN: 17 mg/dL (ref 6–23)
Creatinine, Ser: 1.1 mg/dL (ref 0.50–1.35)
GFR calc Af Amer: 81 mL/min — ABNORMAL LOW (ref >90)
Glucose, Bld: 230 mg/dL — ABNORMAL HIGH (ref 70–99)
Potassium: 3.7 mEq/L (ref 3.5–5.1)
Total Protein: 5.2 g/dL — ABNORMAL LOW (ref 6.0–8.3)

## 2012-07-15 LAB — BLOOD GAS, ARTERIAL
Acid-base deficit: 0.6 mmol/L (ref 0.0–2.0)
Drawn by: 34779
FIO2: 0.9 %
MECHVT: 570 mL
O2 Saturation: 96.9 %
PEEP: 7 cmH2O
RATE: 20 resp/min
pCO2 arterial: 36.7 mmHg (ref 35.0–45.0)

## 2012-07-15 LAB — PREPARE RBC (CROSSMATCH)

## 2012-07-15 LAB — URINALYSIS, ROUTINE W REFLEX MICROSCOPIC
Bilirubin Urine: NEGATIVE
Ketones, ur: NEGATIVE mg/dL
Leukocytes, UA: NEGATIVE
Nitrite: NEGATIVE
Specific Gravity, Urine: 1.02 (ref 1.005–1.030)
Urobilinogen, UA: 0.2 mg/dL (ref 0.0–1.0)

## 2012-07-15 LAB — MAGNESIUM: Magnesium: 1.9 mg/dL (ref 1.5–2.5)

## 2012-07-15 LAB — POCT I-STAT 4, (NA,K, GLUC, HGB,HCT)
Glucose, Bld: 195 mg/dL — ABNORMAL HIGH (ref 70–99)
HCT: 27 % — ABNORMAL LOW (ref 39.0–52.0)
HCT: 27 % — ABNORMAL LOW (ref 39.0–52.0)
Sodium: 137 mEq/L (ref 135–145)

## 2012-07-15 LAB — URINE MICROSCOPIC-ADD ON

## 2012-07-15 MED ORDER — ENOXAPARIN SODIUM 30 MG/0.3ML ~~LOC~~ SOLN
30.0000 mg | Freq: Two times a day (BID) | SUBCUTANEOUS | Status: DC
  Administered 2012-07-15 – 2012-07-17 (×6): 30 mg via SUBCUTANEOUS
  Filled 2012-07-15 (×10): qty 0.3

## 2012-07-15 MED ORDER — ALBUTEROL SULFATE HFA 108 (90 BASE) MCG/ACT IN AERS
4.0000 | INHALATION_SPRAY | RESPIRATORY_TRACT | Status: DC | PRN
  Filled 2012-07-15: qty 6.7

## 2012-07-15 MED ORDER — PIPERACILLIN-TAZOBACTAM 3.375 G IVPB
3.3750 g | Freq: Three times a day (TID) | INTRAVENOUS | Status: DC
  Administered 2012-07-15 – 2012-07-18 (×10): 3.375 g via INTRAVENOUS
  Filled 2012-07-15 (×12): qty 50

## 2012-07-15 MED ORDER — SODIUM CHLORIDE 0.9 % IV SOLN
1250.0000 mg | Freq: Two times a day (BID) | INTRAVENOUS | Status: DC
  Administered 2012-07-15 (×2): 1250 mg via INTRAVENOUS
  Filled 2012-07-15 (×4): qty 1250

## 2012-07-15 MED ORDER — MIDAZOLAM BOLUS VIA INFUSION
1.0000 mg | INTRAVENOUS | Status: DC | PRN
  Filled 2012-07-15: qty 2

## 2012-07-15 MED ORDER — ALBUMIN HUMAN 5 % IV SOLN
25.0000 g | Freq: Once | INTRAVENOUS | Status: AC
  Administered 2012-07-15: 25 g via INTRAVENOUS
  Filled 2012-07-15 (×2): qty 250

## 2012-07-15 MED ORDER — SODIUM CHLORIDE 0.9 % IV SOLN
500.0000 mL | Freq: Once | INTRAVENOUS | Status: AC
  Administered 2012-07-15: 500 mL via INTRAVENOUS

## 2012-07-15 MED ORDER — BIOTENE DRY MOUTH MT LIQD
15.0000 mL | Freq: Four times a day (QID) | OROMUCOSAL | Status: DC
Start: 2012-07-15 — End: 2012-07-16
  Administered 2012-07-15 – 2012-07-16 (×5): 15 mL via OROMUCOSAL

## 2012-07-15 MED ORDER — CHLORHEXIDINE GLUCONATE 0.12 % MT SOLN
15.0000 mL | Freq: Two times a day (BID) | OROMUCOSAL | Status: DC
  Administered 2012-07-15 – 2012-07-16 (×3): 15 mL via OROMUCOSAL
  Filled 2012-07-15 (×4): qty 15

## 2012-07-15 MED ORDER — ALBUMIN HUMAN 5 % IV SOLN
25.0000 g | Freq: Once | INTRAVENOUS | Status: AC
  Administered 2012-07-15: 25 g via INTRAVENOUS
  Filled 2012-07-15: qty 250

## 2012-07-15 MED ORDER — FENTANYL CITRATE 0.05 MG/ML IJ SOLN
50.0000 ug | Freq: Once | INTRAMUSCULAR | Status: AC
  Administered 2012-07-15: 50 ug via INTRAVENOUS

## 2012-07-15 MED ORDER — FENTANYL CITRATE 0.05 MG/ML IJ SOLN
INTRAMUSCULAR | Status: AC
  Filled 2012-07-15: qty 2

## 2012-07-15 MED ORDER — MIDAZOLAM HCL 2 MG/2ML IJ SOLN: 2.0000 mg | Freq: Once | INTRAMUSCULAR | Status: AC

## 2012-07-15 MED ORDER — DIPHENHYDRAMINE HCL 50 MG/ML IJ SOLN
25.0000 mg | Freq: Once | INTRAMUSCULAR | Status: AC
  Administered 2012-07-15: 25 mg via INTRAVENOUS
  Filled 2012-07-15: qty 1

## 2012-07-15 MED ORDER — MIDAZOLAM HCL 2 MG/2ML IJ SOLN
INTRAMUSCULAR | Status: AC
  Administered 2012-07-15: 2 mg via INTRAVENOUS
  Filled 2012-07-15: qty 2

## 2012-07-15 MED ORDER — VANCOMYCIN HCL 10 G IV SOLR
2000.0000 mg | Freq: Once | INTRAVENOUS | Status: AC
  Administered 2012-07-15: 2000 mg via INTRAVENOUS
  Filled 2012-07-15: qty 2000

## 2012-07-15 MED ORDER — HYDROMORPHONE HCL PF 1 MG/ML IJ SOLN: 0.2500 mg | INTRAMUSCULAR | Status: DC | PRN

## 2012-07-15 MED ORDER — FENTANYL BOLUS VIA INFUSION
25.0000 ug | Freq: Four times a day (QID) | INTRAVENOUS | Status: DC | PRN
  Filled 2012-07-15: qty 100

## 2012-07-15 MED ORDER — SODIUM CHLORIDE 0.9 % IV SOLN
1.0000 mg/h | INTRAVENOUS | Status: DC
  Administered 2012-07-15: 1 mg/h via INTRAVENOUS
  Administered 2012-07-15: 2 mg/h via INTRAVENOUS
  Filled 2012-07-15 (×2): qty 10

## 2012-07-15 MED ORDER — SODIUM CHLORIDE 0.9 % IV SOLN
25.0000 ug/h | INTRAVENOUS | Status: DC
  Administered 2012-07-15: 50 ug/h via INTRAVENOUS
  Administered 2012-07-15: 100 ug/h via INTRAVENOUS
  Filled 2012-07-15 (×2): qty 50

## 2012-07-15 MED ORDER — ALBUMIN HUMAN 5 % IV SOLN
INTRAVENOUS | Status: AC
  Filled 2012-07-15: qty 250

## 2012-07-15 NOTE — Progress Notes (Signed)
Inpatient Diabetes Program Recommendations  AACE/ADA: New Consensus Statement on Inpatient Glycemic Control (2013)  Target Ranges:  Prepandial:   less than 140 mg/dL      Peak postprandial:   less than 180 mg/dL (1-2 hours)      Critically ill patients:  140 - 180 mg/dL    Results for COLEMAN, KALAS (MRN [id number]) as of 07/15/2012 10:24  Ref. Range 07/14/2012 14:15 07/14/2012 22:16 07/15/2012 00:16 07/15/2012 04:00 07/15/2012 07:59  Glucose-Capillary Latest Range: 70-99 mg/dL 409 (H) 811 (H) 914 (H) 213 (H) 190 (H)   Inpatient Diabetes Program Recommendations: Please consider using the ICU Hyperglycemia Protocol. Thank you  Piedad Climes BSN, RN,CDE Inpatient Diabetes Coordinator [phone] (team pager)

## 2012-07-15 NOTE — Progress Notes (Signed)
CRITICAL VALUE ALERT  Critical value received:  HGB 6.4  Date of notification: 07/15/2012  Time of notification:  1600  Critical value read back:yes  Nurse who received alert:  Little Ishikawa RN  MD notified (1st page):  Dr. Lindie Spruce  Time of first page:  1615  MD notified (2nd page):  Time of second page:  Responding MD:  Dr. Lindie Spruce  Time MD responded:  1700

## 2012-07-15 NOTE — Progress Notes (Signed)
Orthopaedic Trauma Service (OTS)  Subjective: 1 Day Post-Op Procedure(s) (LRB): OPEN REDUCTION INTERNAL FIXATION (ORIF) ACETABULAR FRACTURE (Right)  Pt on ventilator  Responsive to questions Appears to be stable  Surgery went very well yesterday  Objective: Current Vitals Blood pressure 90/60, pulse 115, temperature 99.7 F (37.6 C), temperature source Oral, resp. rate 22, height 5\' 9"  (1.753 m), weight 102.059 kg (225 lb), SpO2 98.00%. Vital signs in last 24 hours: Temp:  [97.1 F (36.2 C)-99.7 F (37.6 C)] 99.7 F (37.6 C) (03/05 0814) Pulse Rate:  [111-123] 115 (03/05 0814) Resp:  [16-28] 22 (03/05 0814) BP: (70-158)/(41-84) 90/60 mmHg (03/05 0814) SpO2:  [90 %-100 %] 98 % (03/05 0814) FiO2 (%):  [60 %-100 %] 60 % (03/05 0814)  Intake/Output from previous day: 03/04 0701 - 03/05 0700 In: 4918.8 [I.V.:3368.8; IV Piggyback:1550] Out: 2520 [Urine:1325; Drains:95; Blood:600] Intake/Output     03/04 0701 - 03/05 0700 03/05 0701 - 03/06 0700   P.O.     I.V. (mL/kg) 3368.8 (33)    IV Piggyback 1550    Total Intake(mL/kg) 4918.8 (48.2)    Urine (mL/kg/hr) 1325 (0.5)    Drains 95 (0)    Other 500 (0.2)    Blood 600 (0.2)    Total Output 2520     Net +2398.8             LABS  Recent Labs  07/13/12 1655 07/14/12 0335 07/15/12 0124 07/15/12 0450  HGB 11.0* 10.7* 8.5* 7.8*    Recent Labs  07/15/12 0124 07/15/12 0450  WBC 14.1* 10.5  RBC 2.81* 2.61*  HCT 24.4* 22.3*  PLT 170 152    Recent Labs  07/15/12 0124 07/15/12 0450  NA 137 140  K 3.7 3.6  CL 102 104  CO2 26 25  BUN 17 20  CREATININE 1.10 1.12  GLUCOSE 230* 218*  CALCIUM 7.8* 7.9*   No results found for this basename: LABPT, INR,  in the last 72 hours    Physical Exam  Gen: on vent but alert Abd: decreased BS Pelvis: dressings c/d/i, JP with 45 cc output since surgery yesterday  EHL/FHL motor intact  DPN, SPN, TN sensation intact  Ext warm, +DP and PT pulses  txn pin sites  stable R distal femur Ext:      R UEx  Per hand (Dr. Mina Marble)   Imaging Dg Pelvis Comp Min 3v  07/15/2012  *RADIOLOGY REPORT*  Clinical Data: Status post internal fixation of right acetabular fracture.  JUDET PELVIS - 3+ VIEW  Comparison: Intraoperative images performed earlier today at 07:32 p.m.  Findings: The patient is status post internal fixation of the right acetabular fracture, with an associated plate and screws.  A screw is also noted along the right iliac wing.  The fracture is noted in grossly anatomic alignment.  A few of the screws are seen extending beyond the bone.  The right femoral head remains seated at the acetabula.  The left hip joint is grossly unremarkable in appearance.  No new fractures are seen.  The visualized bowel gas pattern is grossly unremarkable.  IMPRESSION: Status post internal fixation of right acetabular fracture, with associated plate and screws; screw also noted along the right iliac wing.   Original Report Authenticated By: Tonia Ghent, M.D.     Assessment/Plan: 1 Day Post-Op Procedure(s) (LRB): OPEN REDUCTION INTERNAL FIXATION (ORIF) ACETABULAR FRACTURE (Right)  62 y/o male s/p fall   1. Fall 2. R anterior column/anterior wall acetabulum fx: OTA 62-A3  POD 1   TDWB R leg   No formal hip precautions   PT/OT consults once extubated   Dressing change tomorrow and will likely remove JP at that time as well              3. R distal humerus fx and R distal radius fx             Per Dr. Mina Marble 4. ABL anemia  Volume being replaced today  Likely to transfuse if Hgb <7  Check cbc in am              5. DVT/PE prophylaxis             Restart lovenox today  Will likely start coumadin in a few days as pt will be fairly immobile given ipsilateral injuries  6. FEN             per TS 7. Dispo            PT/OT once extubated  Per previous conversations family plans to take pt home where house modifications are planned  Await therapy recs  Mearl Latin, PA-C [facility] [phone] (P) 07/15/2012, 9:28 AM

## 2012-07-15 NOTE — Progress Notes (Signed)
ANTIBIOTIC CONSULT NOTE - INITIAL  Pharmacy Consult for vancomycin, Zosyn Indication: rule out pneumonia  No Known Allergies  Patient Measurements: Height: 5\' 9"  (175.3 cm) Weight: 225 lb (102.059 kg) IBW/kg (Calculated) : 70.7  Vital Signs: Temp: 98.2 F (36.8 C) (03/05 0018) Temp src: Oral (03/05 0018) BP: 148/82 mmHg (03/04 2300) Pulse Rate: 121 (03/04 2300) Intake/Output from previous day: 03/04 0701 - 03/05 0700 In: 4010 [I.V.:3010; IV Piggyback:1000] Out: 1850 [Urine:700; Drains:50; Blood:600] Intake/Output from this shift: Total I/O In: 1750 [I.V.:1000; IV Piggyback:750] Out: 525 [Urine:275; Drains:50; Blood:200]  Labs:  Recent Labs  07/12/12 0610 07/13/12 1655 07/14/12 0335  WBC 12.4* 12.3* 13.2*  HGB 10.8* 11.0* 10.7*  PLT 183 148* 163  CREATININE 1.14 0.90 0.91   Estimated Creatinine Clearance: 99.2 ml/min (by C-G formula based on Cr of 0.91). No results found for this basename: VANCOTROUGH, Leodis Binet, VANCORANDOM, GENTTROUGH, GENTPEAK, GENTRANDOM, TOBRATROUGH, TOBRAPEAK, TOBRARND, AMIKACINPEAK, AMIKACINTROU, AMIKACIN,  in the last 72 hours   Microbiology: Recent Results (from the past 720 hour(s))  MRSA PCR SCREENING     Status: None   Collection Time    07/12/12 12:52 AM      Result Value Range Status   MRSA by PCR NEGATIVE  NEGATIVE Final   Comment:            The GeneXpert MRSA Assay (FDA     approved for NASAL specimens     only), is one component of a     comprehensive MRSA colonization     surveillance program. It is not     intended to diagnose MRSA     infection nor to guide or     monitor treatment for     MRSA infections.    Medical History: Past Medical History  Diagnosis Date  . Bee sting allergy   . Hypercholesteremia   . Nephrolithiasis   . Herniated disc     c7-c7, c5-6, c3-4, c4-5  . Hypertension   . Back pain   . Obesity   . BPH (benign prostatic hypertrophy)   . Gastritis   . Reflux   . Diabetes mellitus without  complication     Medications:  Scheduled:  . dutasteride  0.5 mg Oral Daily  . [COMPLETED] fentaNYL      . [COMPLETED] fentaNYL  50 mcg Intravenous Once  . irbesartan  150 mg Oral Daily   And  . hydrochlorothiazide  25 mg Oral Daily  . HYDROmorphone PCA 0.3 mg/mL   Intravenous Q4H  . insulin aspart  0-15 Units Subcutaneous Q4H  . [COMPLETED] midazolam  2 mg Intravenous Once  . pantoprazole  40 mg Oral Daily   Or  . pantoprazole (PROTONIX) IV  40 mg Intravenous Daily  . [COMPLETED] potassium chloride  10 mEq Intravenous Q1 Hr x 4  . [EXPIRED] sodium chloride      . tamsulosin  0.4 mg Oral Daily  . [DISCONTINUED]  ceFAZolin (ANCEF) IV  1 g Intravenous Q8H  . [DISCONTINUED] fentaNYL  50-100 mcg Intravenous Once  . [DISCONTINUED] lisinopril  40 mg Oral QHS  . [DISCONTINUED] metFORMIN  500 mg Oral Q breakfast   Assessment: 62 yo male s/p ORIF right acetabular fracture remains intubated. Pharmacy to manage vancomycin and Zosyn for r/o pneumonia.   Goal of Therapy:  Vancomycin trough level 15-20 mcg/ml  Plan:  1. Zosyn 3.375gm IV Q8H (4 hr infusion). 2. Vancomycin 2gm IV x 1, then 1.25 gm IV Q12H.  3. Consider vancomycin trough  early in therapy.   Emeline Gins 07/15/2012,12:58 AM

## 2012-07-15 NOTE — Progress Notes (Signed)
Follow up - Trauma and Critical Care  Patient Details:    Roger Palmer is an 62 y.o. male.  Lines/tubes : Airway 7.5 mm (Active)  Secured at (cm) 23 cm 07/15/2012  3:50 AM  Measured From Lips 07/15/2012  3:50 AM  Secured Location Center 07/15/2012  3:50 AM  Secured By [employer] 07/15/2012  3:50 AM  Tube Holder Repositioned Yes 07/15/2012  3:50 AM  Cuff Pressure (cm H2O) 26 cm H2O 07/15/2012 12:35 AM  Site Condition Dry 07/15/2012  3:50 AM     Closed System Drain 1 Right Hip Bulb (JP) 15 Fr. (Active)  Site Description Unable to view 07/14/2012  9:50 PM  Dressing Status Clean;Dry;Intact 07/14/2012  9:50 PM  Drainage Appearance Serosanguineous 07/14/2012  9:50 PM  Status To suction (Charged) 07/14/2012  9:50 PM  Output (mL) 45 mL 07/15/2012  7:00 AM     NG/OG Tube Orogastric Center mouth (Active)  Site Assessment Clean;Dry;Intact 07/14/2012  9:50 PM  Status Clamped 07/14/2012  9:50 PM  Drainage Appearance Bloody 07/14/2012  9:50 PM     Urethral Catheter 14 Fr. (Active)  Indication for Insertion or Continuance of Catheter Prolonged immobilization 07/14/2012  9:50 PM  Site Assessment Clean;Intact 07/14/2012  9:50 PM  Collection Container Standard drainage bag 07/14/2012  9:50 PM  Securement Method Securing device (Describe) 07/14/2012  9:50 PM  Urinary Catheter Interventions Unclamped 07/14/2012  9:50 PM  Output (mL) 350 mL 07/15/2012  7:00 AM    Microbiology/Sepsis markers: Results for orders placed during the hospital encounter of 07/11/12  MRSA PCR SCREENING     Status: None   Collection Time    07/12/12 12:52 AM      Result Value Range Status   MRSA by PCR NEGATIVE  NEGATIVE Final   Comment:            The GeneXpert MRSA Assay (FDA     approved for NASAL specimens     only), is one component of a     comprehensive MRSA colonization     surveillance program. It is not     intended to diagnose MRSA     infection nor to guide or     monitor treatment for     MRSA infections.     Anti-infectives:  Anti-infectives   Start     Dose/Rate Route Frequency Ordered Stop   07/15/12 1000  vancomycin (VANCOCIN) 1,250 mg in sodium chloride 0.9 % 250 mL IVPB     1,250 mg 166.7 mL/hr over 90 Minutes Intravenous Every 12 hours 07/15/12 0105     07/15/12 0200  piperacillin-tazobactam (ZOSYN) IVPB 3.375 g     3.375 g 12.5 mL/hr over 240 Minutes Intravenous 3 times per day 07/15/12 0105     07/15/12 0130  vancomycin (VANCOCIN) 2,000 mg in sodium chloride 0.9 % 500 mL IVPB     2,000 mg 250 mL/hr over 120 Minutes Intravenous  Once 07/15/12 0105 07/15/12 0338   07/12/12 0600  ceFAZolin (ANCEF) IVPB 1 g/50 mL premix  Status:  Discontinued     1 g 100 mL/hr over 30 Minutes Intravenous 3 times per day 07/11/12 2242 07/14/12 2218   07/11/12 1615  ceFAZolin (ANCEF) IVPB 1 g/50 mL premix     1 g 100 mL/hr over 30 Minutes Intravenous  Once 07/11/12 1609 07/11/12 1700      Best Practice/Protocols:  VTE Prophylaxis: Mechanical GI Prophylaxis: Proton Pump Inhibitor Continous Sedation  Consults: Treatment Team:  Doralee Albino  Carola Frost, MD Md Pccm, MD    Events:  Subjective:    Overnight Issues: The patient was brought back to the ICU after surgery because of likely aspiration during intubation for surgery.  He was hypoxemic throughout the night, but it has improved somewhat this morning.  He is alert and able to be aroused on the ventilator.  Objective:  Vital signs for last 24 hours: Temp:  [97.1 F (36.2 C)-99.7 F (37.6 C)] 99.7 F (37.6 C) (03/05 0814) Pulse Rate:  [111-123] 115 (03/05 0814) Resp:  [16-28] 22 (03/05 0814) BP: (70-158)/(41-84) 90/60 mmHg (03/05 0814) SpO2:  [90 %-100 %] 98 % (03/05 0814) FiO2 (%):  [60 %-100 %] 60 % (03/05 0814)  Hemodynamic parameters for last 24 hours:    Intake/Output from previous day: 03/04 0701 - 03/05 0700 In: 4918.8 [I.V.:3368.8; IV Piggyback:1550] Out: 2520 [Urine:1325; Drains:95; Blood:600]  Intake/Output this shift:     Vent settings for last 24 hours: Vent Mode:  [-] PRVC FiO2 (%):  [60 %-100 %] 60 % Set Rate:  [16 bmp-20 bmp] 20 bmp Vt Set:  [570 mL] 570 mL PEEP:  [5 cmH20-7 cmH20] 7 cmH20 Plateau Pressure:  [15 cmH20-20 cmH20] 15 cmH20  Physical Exam:  General: no respiratory distress and arousable Neuro: oriented and nonfocal exam Resp: diminished breath sounds base - left and CXR shows likely left basilar infiltration, possible consolidation GI: soft, nontender, BS WNL, no r/g, distended, hypoactive BS and Not a lot of OGT output, but it has not been on suction. Extremities: edema 1+ and good pulses bilaterally  Results for orders placed during the hospital encounter of 07/11/12 (from the past 24 hour(s))  GLUCOSE, CAPILLARY     Status: Abnormal   Collection Time    07/14/12 11:22 AM      Result Value Range   Glucose-Capillary 189 (*) 70 - 99 mg/dL   Comment 1 Documented in Chart     Comment 2 Notify RN    GLUCOSE, CAPILLARY     Status: Abnormal   Collection Time    07/14/12  2:15 PM      Result Value Range   Glucose-Capillary 161 (*) 70 - 99 mg/dL  POCT I-STAT 3, BLOOD GAS (G3+)     Status: Abnormal   Collection Time    07/14/12 10:00 PM      Result Value Range   pH, Arterial 7.319 (*) 7.350 - 7.450   pCO2 arterial 49.9 (*) 35.0 - 45.0 mmHg   pO2, Arterial 55.0 (*) 80.0 - 100.0 mmHg   Bicarbonate 25.8 (*) 20.0 - 24.0 mEq/L   TCO2 27  0 - 100 mmol/L   O2 Saturation 86.0     Acid-base deficit 1.0  0.0 - 2.0 mmol/L   Patient temperature 97.6 F     Collection site ARTERIAL LINE     Drawn by Nurse     Sample type ARTERIAL    GLUCOSE, CAPILLARY     Status: Abnormal   Collection Time    07/14/12 10:16 PM      Result Value Range   Glucose-Capillary 242 (*) 70 - 99 mg/dL   Comment 1 Notify RN    BLOOD GAS, ARTERIAL     Status: Abnormal   Collection Time    07/14/12 11:15 PM      Result Value Range   FIO2 1.00     Delivery systems VENTILATOR     Mode PRESSURE REGULATED VOLUME  CONTROL     VT 570  Rate 16     Peep/cpap 5.0     pH, Arterial 7.275 (*) 7.350 - 7.450   pCO2 arterial 52.1 (*) 35.0 - 45.0 mmHg   pO2, Arterial 60.5 (*) 80.0 - 100.0 mmHg   Bicarbonate 23.7  20.0 - 24.0 mEq/L   TCO2 25.4  0 - 100 mmol/L   Acid-base deficit 2.3 (*) 0.0 - 2.0 mmol/L   O2 Saturation 89.9     Patient temperature 97.1     Collection site LEFT RADIAL     Drawn by [phone]     Sample type ARTERIAL     Allens test (pass/fail) PASS  PASS  GLUCOSE, CAPILLARY     Status: Abnormal   Collection Time    07/15/12 12:16 AM      Result Value Range   Glucose-Capillary 241 (*) 70 - 99 mg/dL   Comment 1 Documented in Chart     Comment 2 Notify RN    CBC     Status: Abnormal   Collection Time    07/15/12  1:24 AM      Result Value Range   WBC 14.1 (*) 4.0 - 10.5 K/uL   RBC 2.81 (*) 4.22 - 5.81 MIL/uL   Hemoglobin 8.5 (*) 13.0 - 17.0 g/dL   HCT 11.9 (*) 14.7 - 82.9 %   MCV 86.8  78.0 - 100.0 fL   MCH 30.2  26.0 - 34.0 pg   MCHC 34.8  30.0 - 36.0 g/dL   RDW 56.2  13.0 - 86.5 %   Platelets 170  150 - 400 K/uL  COMPREHENSIVE METABOLIC PANEL     Status: Abnormal   Collection Time    07/15/12  1:24 AM      Result Value Range   Sodium 137  135 - 145 mEq/L   Potassium 3.7  3.5 - 5.1 mEq/L   Chloride 102  96 - 112 mEq/L   CO2 26  19 - 32 mEq/L   Glucose, Bld 230 (*) 70 - 99 mg/dL   BUN 17  6 - 23 mg/dL   Creatinine, Ser 7.84  0.50 - 1.35 mg/dL   Calcium 7.8 (*) 8.4 - 10.5 mg/dL   Total Protein 5.2 (*) 6.0 - 8.3 g/dL   Albumin 2.7 (*) 3.5 - 5.2 g/dL   AST 18  0 - 37 U/L   ALT 10  0 - 53 U/L   Alkaline Phosphatase 31 (*) 39 - 117 U/L   Total Bilirubin 0.6  0.3 - 1.2 mg/dL   GFR calc non Af Amer 70 (*) >90 mL/min   GFR calc Af Amer 81 (*) >90 mL/min  MAGNESIUM     Status: None   Collection Time    07/15/12  1:24 AM      Result Value Range   Magnesium 1.9  1.5 - 2.5 mg/dL  URINALYSIS, ROUTINE W REFLEX MICROSCOPIC     Status: Abnormal   Collection Time    07/15/12  1:31  AM      Result Value Range   Color, Urine YELLOW  YELLOW   APPearance CLEAR  CLEAR   Specific Gravity, Urine 1.020  1.005 - 1.030   pH 6.0  5.0 - 8.0   Glucose, UA >1000 (*) NEGATIVE mg/dL   Hgb urine dipstick SMALL (*) NEGATIVE   Bilirubin Urine NEGATIVE  NEGATIVE   Ketones, ur NEGATIVE  NEGATIVE mg/dL   Protein, ur NEGATIVE  NEGATIVE mg/dL   Urobilinogen, UA 0.2  0.0 - 1.0  mg/dL   Nitrite NEGATIVE  NEGATIVE   Leukocytes, UA NEGATIVE  NEGATIVE  URINE MICROSCOPIC-ADD ON     Status: Abnormal   Collection Time    07/15/12  1:31 AM      Result Value Range   Squamous Epithelial / LPF RARE  RARE   WBC, UA 3-6  <3 WBC/hpf   RBC / HPF 3-6  <3 RBC/hpf   Bacteria, UA RARE  RARE   Casts HYALINE CASTS (*) NEGATIVE   Urine-Other MUCOUS PRESENT    BLOOD GAS, ARTERIAL     Status: Abnormal   Collection Time    07/15/12  1:40 AM      Result Value Range   FIO2 0.90     Delivery systems VENTILATOR     Mode PRESSURE REGULATED VOLUME CONTROL     VT 570     Rate 20     Peep/cpap 7.0     pH, Arterial 7.419  7.350 - 7.450   pCO2 arterial 36.7  35.0 - 45.0 mmHg   pO2, Arterial 163.0 (*) 80.0 - 100.0 mmHg   Bicarbonate 23.3  20.0 - 24.0 mEq/L   TCO2 24.5  0 - 100 mmol/L   Acid-base deficit 0.6  0.0 - 2.0 mmol/L   O2 Saturation 96.9     Patient temperature 98.2     Collection site LEFT RADIAL     Drawn by [phone]     Sample type ARTERIAL     Allens test (pass/fail) PASS  PASS  GLUCOSE, CAPILLARY     Status: Abnormal   Collection Time    07/15/12  4:00 AM      Result Value Range   Glucose-Capillary 213 (*) 70 - 99 mg/dL   Comment 1 Documented in Chart     Comment 2 Notify RN    CBC     Status: Abnormal   Collection Time    07/15/12  4:50 AM      Result Value Range   WBC 10.5  4.0 - 10.5 K/uL   RBC 2.61 (*) 4.22 - 5.81 MIL/uL   Hemoglobin 7.8 (*) 13.0 - 17.0 g/dL   HCT 19.1 (*) 47.8 - 29.5 %   MCV 85.4  78.0 - 100.0 fL   MCH 29.9  26.0 - 34.0 pg   MCHC 35.0  30.0 - 36.0 g/dL   RDW  62.1  30.8 - 65.7 %   Platelets 152  150 - 400 K/uL  BASIC METABOLIC PANEL     Status: Abnormal   Collection Time    07/15/12  4:50 AM      Result Value Range   Sodium 140  135 - 145 mEq/L   Potassium 3.6  3.5 - 5.1 mEq/L   Chloride 104  96 - 112 mEq/L   CO2 25  19 - 32 mEq/L   Glucose, Bld 218 (*) 70 - 99 mg/dL   BUN 20  6 - 23 mg/dL   Creatinine, Ser 8.46  0.50 - 1.35 mg/dL   Calcium 7.9 (*) 8.4 - 10.5 mg/dL   GFR calc non Af Amer 69 (*) >90 mL/min   GFR calc Af Amer 80 (*) >90 mL/min     Assessment/Plan:   NEURO  Altered Mental Status:  agitation and sedation   Plan: Better sedation so that the patient can tolerate the ventilator until ready to extubate, possibly later today.  PULM  Atelectasis/collapse (focal and in the left base)   Plan: Continue support, antibiotics for  likely aspiration  CARDIO  Sinus Tachycardia   Plan: No specfic treatment.  Related to volume status and overall condition  RENAL  No problems   Plan: No specific treatment.  Although the patient is moderately hypotensive, he does have good urine output  GI  Ileus   Plan: OGT decompression.  ID  Pneumonia (aspiration and aspiration pneumonitis)   Plan: Continue support and antibiotics  HEME  Anemia acute blood loss anemia and anemia of critical illness)   Plan: Will give some volume today, and if HGB drops below 7.0, may need to give blood.  ENDO No specific abnormality   Plan: No specific treatment.  Global Issues  Aspiration PNA peri-operatively with now some consolidation in the LLL.  No fever, but patient is moderately hypoxemic. Hypotension with good urine output.  Will give some colloid bolus, check coags, Increase IVFs.  ETT was a bit high--will advance 2.0cm.    LOS: 4 days   Additional comments:I reviewed the patient's new clinical lab test results. cbc/bmet and I reviewed the patients new imaging test results. CXR  Critical Care Total Time*: 32 minutes  WYATT, JAMES  O 07/15/2012  *Care during the described time interval was provided by me and/or other providers on the critical care team.  I have reviewed this patient's available data, including medical history, events of note, physical examination and test results as part of my evaluation.

## 2012-07-15 NOTE — Progress Notes (Signed)
Advanced tube 2cm per Dr Lindie Spruce

## 2012-07-15 NOTE — Progress Notes (Signed)
Dr. Lindie Spruce notified of pt. With generalized hives.  Pt. Complains of itching.  Benadryl 25mg  given as ordered.  Continue to monitor.

## 2012-07-15 NOTE — Op Note (Signed)
NAME:  Roger Palmer, Roger Palmer NO.:  [id number]  MEDICAL RECORD NO.:  [id number]  LOCATION:  [facility]                         FACILITY:  [facility]  PHYSICIAN:  Doralee Albino. Carola Frost, M.D. DATE OF BIRTH:  1950/05/30  DATE OF PROCEDURE:  07/14/2012 DATE OF DISCHARGE:                              OPERATIVE REPORT   PREOPERATIVE DIAGNOSIS:  Fracture of the right acetabulum with involvement of the columns in anterior column, posterior hemitransverse pattern.  POSTOPERATIVE DIAGNOSIS:  Fracture of the right acetabulum with involvement of the columns in anterior column, posterior hemitransverse pattern.  PROCEDURE:  Repair of right acetabulum anterior column and posterior hemitransverse pattern.  SURGEON:  Doralee Albino. Carola Frost, MD  ASSISTANT:  Mearl Latin, PA  ANESTHESIA:  General.  COMPLICATIONS:  None.  ESTIMATED BLOOD LOSS:  200 mL.  URINARY OUTPUT:  75 mL.  SPECIMENS:  None.  COMPLICATIONS:  Suspected aspiration towards the end of the procedure requiring changing of the endotracheal tube and intubation postoperatively such that the patient is going to the unit but at this time inspected by way of the PACU.  DISPOSITION:  To PACU.  CONDITION:  Stable, but intubated.  BRIEF SUMMARY AND INDICATION FOR PROCEDURE:  Roger Palmer is a 62 year old male who fell from a tree sustaining multiple injuries including a fracture and dislocation of the elbow, distal radius and a severe right acetabular fracture.  He was seen initially by Dr. Shon Baton and Dr. Mina Marble, with Dr. Shon Baton placed him in skeletal traction for the acetabulum to aid in maintenance reduction.  I did discuss with him the risks and benefits of surgery including the possibility of infection, nerve injury, vessel injury, DVT, PE, heart attack, stroke, heterotopic ossification, hip instability, arthritis, loss of motion, malunion, nonunion, and need further surgery among others.  We also specifically discussed the  possibility of urologic injury and sexual dysfunction. The patient understood these risks and did wish to proceed.  BRIEF DESCRIPTION OF PROCEDURE:  Roger Palmer was taken to the operating room where general anesthesia was induced.  He was positioned supine with a bump under the right hip.  After standard prep and drape, the superior window of the ilioinguinal approach was made carefully carrying dissection down to the inner table.  I identified the fracture site.  It was then packed with a sponge.  Attention was turned to the abdomen where a Pfannenstiel incision was made.  The previous hernia repair was identified and the spermatic cord actually bend, re positioned much more essentially directly over the pubic tubercle.  This did require additional difficulty and time to mobilize anteriorly and laterally. This then enabled dissection around the pelvic brim in stepwise fashion. I did use a hemoclip for a branching vein.  The hip was placed in about 45 degrees of flexion to further facilitate this exposure by relaxing this iliopsoas.  The anterior column fracture was identified and cleaned out as was the posterior hemi transverse segment with the quadrilateral plate being cleaned, and then I irrigated thoroughly with a Pulsavac back toward the posterior column as well as again within the anterior column fracture site.  I did put a Schanz pin in the anterior-inferior iliac  spine, and I was able to joystick the wing segment.  I then went to the superior window and cleaned out the fracture site there with a curved curette and a Pulsavac.  We then obtained reduction as best as possible  within the pelvis and then reduced the rim of the pelvis securing it with a 90-mm lag screw.  This was followed by returning to the inside approach cleaning out the fracture site using a ball spike pusher to reduce the anterior column.  This was then secured with a pelvic brim plate which was contoured to fit the  brim and selected such that it would exit over that anterior column fragment.  I was able to place a lag screw across the anterior column and into the super acetabulum to compress across the articular surface of the acetabular down and then to place additional screw into the ischium, anterior and distal or medial to the hip joint, and then placed 2 additional screws from within the superior window into the posterior column along the ischium.  One of these was exchanged for a shorter screw.  The final images showed appropriate restoration of the articular surface with excellent reduction and no visible step-off and appropriate placement of hardware without any acute complication.  During closure, the patient was noted to have some changes in his pulmonary function and suction was performed with evacuation of some possible gastric contents.  Because of this suspected aspiration after the suctioning the patient was left intubated with plan to be transferred either directly to the unit or to the PACU and then to the unit as the bed placement was available.  The patient remained hemodynamically stable throughout.  The abdominal wound was closed with #1 figure-of-eight interrupted Vicryl with a deep drain and 0 Vicryl for the deep subcu, 2-0 Vicryl, and nylon.  The lateral superior window of the ilioinguinal was closed in a standard layered fashion as well as 0 Vicryl, 2-0 Vicryl, and nylon.  Mepilex dressings were applied.  Roger Morita, PA-C did assist me throughout this procedure and was absolutely necessary for its safe and effective completion as he was required to protect the spermatic cord during approach as well as the bladder to produce the reduction by manipulation of the anterior column fragment and also helped with simultaneous closure.  PROGNOSIS:  Roger Palmer will be touchdown weightbearing on the right hip for the next 8 weeks with graduated weightbearing for the ensuing 4 weeks.   He will not require any formal heterotopic bone prophylaxis, no formal range of motion precautions.  He will be on pharmacologic DVT prophylaxis as feasible to the General Surgery Trauma Service, and off course pulmonary function remains our primary concern at this time as he is at certainly increased risk given the suspected aspiration during the procedure.  We also specifically discussed the possibility of urologic injury and sexual dysfunction.     Doralee Albino. Carola Frost, M.D.     [facility]/MEDQ  D:  07/14/2012  T:  07/15/2012  Job:  [id number]

## 2012-07-15 NOTE — Progress Notes (Signed)
The patient intubated and transferred to the ICU post ORIF; there is a question of aspiration; currently agitated, awake and asynchronous with the vent;   Primary physician ordering continuous sedation; while waiting for the drips to arrive from pharmacy, he will receive Versed 2 mg and Fentanyl 50 mcg; ordered placed.   We will repeat ABG once comfortable on the vent.

## 2012-07-16 ENCOUNTER — Inpatient Hospital Stay (HOSPITAL_COMMUNITY): Payer: 59

## 2012-07-16 DIAGNOSIS — J95821 Acute postprocedural respiratory failure: Secondary | ICD-10-CM

## 2012-07-16 LAB — BASIC METABOLIC PANEL
BUN: 37 mg/dL — ABNORMAL HIGH (ref 6–23)
CO2: 22 mEq/L (ref 19–32)
Calcium: 8.1 mg/dL — ABNORMAL LOW (ref 8.4–10.5)
Calcium: 8.2 mg/dL — ABNORMAL LOW (ref 8.4–10.5)
Creatinine, Ser: 2.47 mg/dL — ABNORMAL HIGH (ref 0.50–1.35)
Creatinine, Ser: 3.54 mg/dL — ABNORMAL HIGH (ref 0.50–1.35)
GFR calc Af Amer: 31 mL/min — ABNORMAL LOW (ref >90)
GFR calc Af Amer: 20 mL/min — ABNORMAL LOW (ref >90)
GFR calc non Af Amer: 26 mL/min — ABNORMAL LOW (ref >90)
GFR calc non Af Amer: 17 mL/min — ABNORMAL LOW (ref >90)
Glucose, Bld: 170 mg/dL — ABNORMAL HIGH (ref 70–99)

## 2012-07-16 LAB — TYPE AND SCREEN
ABO/RH(D): O NEG
Unit division: 0

## 2012-07-16 LAB — BLOOD GAS, ARTERIAL
Bicarbonate: 23.9 mEq/L (ref 20.0–24.0)
PEEP: 5 cmH2O
Patient temperature: 98.1
TCO2: 25 mmol/L (ref 0–100)
pCO2 arterial: 36.2 mmHg (ref 35.0–45.0)
pH, Arterial: 7.433 (ref 7.350–7.450)

## 2012-07-16 LAB — CBC WITH DIFFERENTIAL/PLATELET
Basophils Relative: 0 % (ref 0–1)
Eosinophils Absolute: 0.3 10*3/uL (ref 0.0–0.7)
Eosinophils Relative: 3 % (ref 0–5)
HCT: 24.1 % — ABNORMAL LOW (ref 39.0–52.0)
Hemoglobin: 8.5 g/dL — ABNORMAL LOW (ref 13.0–17.0)
Lymphs Abs: 1.6 10*3/uL (ref 0.7–4.0)
MCH: 30 pg (ref 26.0–34.0)
MCHC: 35.3 g/dL (ref 30.0–36.0)
MCV: 85.2 fL (ref 78.0–100.0)
Monocytes Absolute: 1.3 10*3/uL — ABNORMAL HIGH (ref 0.1–1.0)
Monocytes Relative: 13 % — ABNORMAL HIGH (ref 3–12)
Neutrophils Relative %: 70 % (ref 43–77)

## 2012-07-16 LAB — GLUCOSE, CAPILLARY
Glucose-Capillary: 134 mg/dL — ABNORMAL HIGH (ref 70–99)
Glucose-Capillary: 145 mg/dL — ABNORMAL HIGH (ref 70–99)
Glucose-Capillary: 162 mg/dL — ABNORMAL HIGH (ref 70–99)

## 2012-07-16 LAB — CBC
MCH: 30.3 pg (ref 26.0–34.0)
MCHC: 36.1 g/dL — ABNORMAL HIGH (ref 30.0–36.0)
MCV: 84 fL (ref 78.0–100.0)
Platelets: 173 10*3/uL (ref 150–400)
RDW: 14.8 % (ref 11.5–15.5)

## 2012-07-16 MED ORDER — POTASSIUM CHLORIDE 10 MEQ/50ML IV SOLN: 10.0000 meq | INTRAVENOUS | Status: AC

## 2012-07-16 MED ORDER — POTASSIUM CHLORIDE 10 MEQ/100ML IV SOLN
INTRAVENOUS | Status: AC
  Administered 2012-07-16: 10 meq
  Filled 2012-07-16: qty 200

## 2012-07-16 MED ORDER — VANCOMYCIN HCL 10 G IV SOLR
1500.0000 mg | INTRAVENOUS | Status: DC
  Administered 2012-07-16: 1500 mg via INTRAVENOUS
  Filled 2012-07-16: qty 1500

## 2012-07-16 MED ORDER — POTASSIUM CHLORIDE 2 MEQ/ML IV SOLN
INTRAVENOUS | Status: DC
  Administered 2012-07-16 – 2012-07-19 (×3): via INTRAVENOUS
  Filled 2012-07-16 (×9): qty 1000

## 2012-07-16 MED ORDER — SODIUM CHLORIDE 0.9 % IV BOLUS (SEPSIS)
1000.0000 mL | Freq: Once | INTRAVENOUS | Status: AC
  Administered 2012-07-16: 1000 mL via INTRAVENOUS

## 2012-07-16 MED ORDER — VANCOMYCIN HCL 10 G IV SOLR
1250.0000 mg | INTRAVENOUS | Status: DC
  Filled 2012-07-16: qty 1250

## 2012-07-16 MED ORDER — POTASSIUM CHLORIDE 20 MEQ/15ML (10%) PO LIQD
ORAL | Status: AC
  Filled 2012-07-16: qty 30

## 2012-07-16 NOTE — Progress Notes (Signed)
Patient ID: Roger Palmer, male   DOB: 28-Dec-1950, 62 y.o.   MRN: [id number] Follow up - Trauma Critical Care  Patient Details:    Roger Palmer is an 62 y.o. male.  Lines/tubes : Airway 7.5 mm (Active)  Secured at (cm) 24 cm 07/16/2012  5:05 AM  Measured From Lips 07/16/2012  5:05 AM  Secured Location Center 07/16/2012  5:05 AM  Secured By [employer] 07/16/2012  5:05 AM  Tube Holder Repositioned Yes 07/16/2012  5:05 AM  Cuff Pressure (cm H2O) 24 cm H2O 07/16/2012  1:25 AM  Site Condition Dry 07/16/2012  5:05 AM     Closed System Drain 1 Right Hip Bulb (JP) 15 Fr. (Active)  Site Description Unable to view 07/16/2012  4:00 AM  Dressing Status Clean;Dry;Intact 07/16/2012  4:00 AM  Drainage Appearance Serosanguineous 07/16/2012  4:00 AM  Status To suction (Charged) 07/16/2012  4:00 AM  Output (mL) 25 mL 07/15/2012  6:00 PM     NG/OG Tube Orogastric Center mouth (Active)  Placement Verification Auscultation 07/16/2012  4:00 AM  Site Assessment Clean;Dry;Intact 07/16/2012  4:00 AM  Status Suction-low intermittent 07/16/2012  4:00 AM  Drainage Appearance Brown 07/16/2012  4:00 AM  Output (mL) 150 mL 07/15/2012  6:00 PM     Urethral Catheter 14 Fr. (Active)  Indication for Insertion or Continuance of Catheter Prolonged immobilization 07/16/2012  4:00 AM  Site Assessment Clean;Intact 07/16/2012  4:00 AM  Collection Container Standard drainage bag 07/16/2012  4:00 AM  Securement Method Securing device (Describe) 07/16/2012  4:00 AM  Urinary Catheter Interventions Unclamped 07/16/2012  4:00 AM  Output (mL) 50 mL 07/16/2012  4:00 AM    Microbiology/Sepsis markers: Results for orders placed during the hospital encounter of 07/11/12  MRSA PCR SCREENING     Status: None   Collection Time    07/12/12 12:52 AM      Result Value Range Status   MRSA by PCR NEGATIVE  NEGATIVE Final   Comment:            The GeneXpert MRSA Assay (FDA     approved for NASAL specimens     only), is one component of a     comprehensive  MRSA colonization     surveillance program. It is not     intended to diagnose MRSA     infection nor to guide or     monitor treatment for     MRSA infections.  CULTURE, RESPIRATORY (NON-EXPECTORATED)     Status: None   Collection Time    07/15/12  3:35 AM      Result Value Range Status   Specimen Description TRACHEAL ASPIRATE   Final   Special Requests Normal   Final   Gram Stain     Final   Value: MODERATE WBC PRESENT,BOTH PMN AND MONONUCLEAR     NO SQUAMOUS EPITHELIAL CELLS SEEN     NO ORGANISMS SEEN   Culture PENDING   Incomplete   Report Status PENDING   Incomplete    Anti-infectives:  Anti-infectives   Start     Dose/Rate Route Frequency Ordered Stop   07/15/12 1000  vancomycin (VANCOCIN) 1,250 mg in sodium chloride 0.9 % 250 mL IVPB     1,250 mg 166.7 mL/hr over 90 Minutes Intravenous Every 12 hours 07/15/12 0105     07/15/12 0200  piperacillin-tazobactam (ZOSYN) IVPB 3.375 g     3.375 g 12.5 mL/hr over 240 Minutes Intravenous 3 times per day  07/15/12 0105     07/15/12 0130  vancomycin (VANCOCIN) 2,000 mg in sodium chloride 0.9 % 500 mL IVPB     2,000 mg 250 mL/hr over 120 Minutes Intravenous  Once 07/15/12 0105 07/15/12 0338   07/12/12 0600  ceFAZolin (ANCEF) IVPB 1 g/50 mL premix  Status:  Discontinued     1 g 100 mL/hr over 30 Minutes Intravenous 3 times per day 07/11/12 2242 07/14/12 2218   07/11/12 1615  ceFAZolin (ANCEF) IVPB 1 g/50 mL premix     1 g 100 mL/hr over 30 Minutes Intravenous  Once 07/11/12 1609 07/11/12 1700      Best Practice/Protocols:  VTE Prophylaxis: Lovenox (prophylaxtic dose) Continous Sedation  Consults: Treatment Team:  Budd Palmer, MD    Studies: CXR: 1. Stable positioning of support apparatus. No pneumothorax.  2. Grossly unchanged findings worrisome for multifocal infection   Subjective:    Overnight Issues: U/O only 20cc/hr  Objective:  Vital signs for last 24 hours: Temp:  [98 F (36.7 C)-99.9 F (37.7 C)]  98.1 F (36.7 C) (03/06 0412) Pulse Rate:  [97-124] 97 (03/06 0700) Resp:  [18-25] 20 (03/06 0700) BP: (76-118)/(41-60) 100/48 mmHg (03/06 0700) SpO2:  [87 %-100 %] 100 % (03/06 0700) FiO2 (%):  [40 %-60 %] 40 % (03/06 0505)  Hemodynamic parameters for last 24 hours:    Intake/Output from previous day: 03/05 0701 - 03/06 0700 In: 4913.9 [I.V.:2667.7; Blood:608.8; IV Piggyback:1637.5] Out: 905 [Urine:730; Emesis/NG output:150; Drains:25]  Intake/Output this shift:    Vent settings for last 24 hours: Vent Mode:  [-] PRVC FiO2 (%):  [40 %-60 %] 40 % Set Rate:  [20 bmp] 20 bmp Vt Set:  [570 mL] 570 mL PEEP:  [5 cmH20] 5 cmH20 Plateau Pressure:  [17 cmH20-19 cmH20] 19 cmH20  Physical Exam:  General: arousable on vent Neuro: PERL. moves R fingers and toes to command, moves LUE and LLE to command Resp: clear to auscultation bilaterally CVS: RRR GI: soft, NT, ND, +BS Extremities: splint RUE fingers minimal edema, L thigh previous pin sites dressed, +DP B, calves soft  Results for orders placed during the hospital encounter of 07/11/12 (from the past 24 hour(s))  GLUCOSE, CAPILLARY     Status: Abnormal   Collection Time    07/15/12  7:59 AM      Result Value Range   Glucose-Capillary 190 (*) 70 - 99 mg/dL  PROTIME-INR     Status: None   Collection Time    07/15/12 10:10 AM      Result Value Range   Prothrombin Time 15.0  11.6 - 15.2 seconds   INR 1.20  0.00 - 1.49  GLUCOSE, CAPILLARY     Status: Abnormal   Collection Time    07/15/12 12:15 PM      Result Value Range   Glucose-Capillary 175 (*) 70 - 99 mg/dL  CBC     Status: Abnormal   Collection Time    07/15/12  2:51 PM      Result Value Range   WBC 8.2  4.0 - 10.5 K/uL   RBC 2.14 (*) 4.22 - 5.81 MIL/uL   Hemoglobin 6.4 (*) 13.0 - 17.0 g/dL   HCT 16.1 (*) 09.6 - 04.5 %   MCV 85.5  78.0 - 100.0 fL   MCH 29.9  26.0 - 34.0 pg   MCHC 35.0  30.0 - 36.0 g/dL   RDW 40.9  81.1 - 91.4 %   Platelets 140 (*) 150 -  400 K/uL   GLUCOSE, CAPILLARY     Status: Abnormal   Collection Time    07/15/12  3:30 PM      Result Value Range   Glucose-Capillary 110 (*) 70 - 99 mg/dL  PREPARE RBC (CROSSMATCH)     Status: None   Collection Time    07/15/12  6:08 PM      Result Value Range   Order Confirmation ORDER PROCESSED BY BLOOD BANK    TYPE AND SCREEN     Status: None   Collection Time    07/15/12  6:08 PM      Result Value Range   ABO/RH(D) O NEG     Antibody Screen NEG     Sample Expiration 07/18/2012     Unit Number [id number]     Blood Component Type RBC LR PHER2     Unit division 00     Status of Unit ISSUED     Transfusion Status OK TO TRANSFUSE     Crossmatch Result Compatible     Unit Number [id number]     Blood Component Type RED CELLS,LR     Unit division 00     Status of Unit ISSUED     Transfusion Status OK TO TRANSFUSE     Crossmatch Result Compatible    GLUCOSE, CAPILLARY     Status: Abnormal   Collection Time    07/15/12  8:03 PM      Result Value Range   Glucose-Capillary 150 (*) 70 - 99 mg/dL  GLUCOSE, CAPILLARY     Status: Abnormal   Collection Time    07/16/12 12:14 AM      Result Value Range   Glucose-Capillary 145 (*) 70 - 99 mg/dL   Comment 1 Documented in Chart     Comment 2 Notify RN    CBC WITH DIFFERENTIAL     Status: Abnormal   Collection Time    07/16/12  3:05 AM      Result Value Range   WBC 10.4  4.0 - 10.5 K/uL   RBC 2.83 (*) 4.22 - 5.81 MIL/uL   Hemoglobin 8.5 (*) 13.0 - 17.0 g/dL   HCT 30.8 (*) 65.7 - 84.6 %   MCV 85.2  78.0 - 100.0 fL   MCH 30.0  26.0 - 34.0 pg   MCHC 35.3  30.0 - 36.0 g/dL   RDW 96.2  95.2 - 84.1 %   Platelets 157  150 - 400 K/uL   Neutrophils Relative 70  43 - 77 %   Neutro Abs 7.2  1.7 - 7.7 K/uL   Lymphocytes Relative 15  12 - 46 %   Lymphs Abs 1.6  0.7 - 4.0 K/uL   Monocytes Relative 13 (*) 3 - 12 %   Monocytes Absolute 1.3 (*) 0.1 - 1.0 K/uL   Eosinophils Relative 3  0 - 5 %   Eosinophils Absolute 0.3  0.0 - 0.7 K/uL    Basophils Relative 0  0 - 1 %   Basophils Absolute 0.0  0.0 - 0.1 K/uL  BASIC METABOLIC PANEL     Status: Abnormal   Collection Time    07/16/12  3:05 AM      Result Value Range   Sodium 138  135 - 145 mEq/L   Potassium 3.2 (*) 3.5 - 5.1 mEq/L   Chloride 105  96 - 112 mEq/L   CO2 24  19 - 32 mEq/L   Glucose, Bld 170 (*) 70 - 99  mg/dL   BUN 37 (*) 6 - 23 mg/dL   Creatinine, Ser 0.45 (*) 0.50 - 1.35 mg/dL   Calcium 8.1 (*) 8.4 - 10.5 mg/dL   GFR calc non Af Amer 26 (*) >90 mL/min   GFR calc Af Amer 31 (*) >90 mL/min  GLUCOSE, CAPILLARY     Status: Abnormal   Collection Time    07/16/12  4:13 AM      Result Value Range   Glucose-Capillary 172 (*) 70 - 99 mg/dL  BLOOD GAS, ARTERIAL     Status: Abnormal   Collection Time    07/16/12  5:30 AM      Result Value Range   FIO2 0.40     Delivery systems VENTILATOR     Mode PRESSURE REGULATED VOLUME CONTROL     VT 570     Rate 20     Peep/cpap 5.0     pH, Arterial 7.433  7.350 - 7.450   pCO2 arterial 36.2  35.0 - 45.0 mmHg   pO2, Arterial 102.0 (*) 80.0 - 100.0 mmHg   Bicarbonate 23.9  20.0 - 24.0 mEq/L   TCO2 25.0  0 - 100 mmol/L   Acid-Base Excess 0.1  0.0 - 2.0 mmol/L   O2 Saturation 96.5     Patient temperature 98.1     Collection site LEFT RADIAL     Drawn by [phone]     Sample type ARTERIAL     Allens test (pass/fail) PASS  PASS    Assessment & Plan: Present on Admission:  **None**   LOS: 5 days  S/p fall from tree on 07/11/12 Open right elbow dislocation/R distal radius fx - s/p ORIF of right wrist and elbow with radial head replacement and reconstruction of lateral elbow, per Dr. Mina Marble R acetabular fx/R iliac wing fx - S/P ORIF by Dr. Carola Frost VDRF/Aspiration - gas exchange much improved, weaning now with hope to extubate this AM ID - Vanc/Zosyn empiric for aspiration, CXR still shows possible multifocal PNA AKI - also decreasing U/O, will give 0.9NS bolus, Vanc is being dosed by pharmacy R inf pubic ramus fx ABL  anemia - received 2u PRBC yesterday, F/U - may drop with fluid bolus VTE - SCD's, Lovenox FEN - supplement K+ Dispo -  vent  Critical Care Total Time*: 38 Minutes  Violeta Gelinas, MD, MPH, FACS Pager: [phone]  07/16/2012  *Care during the described time interval was provided by me and/or other providers on the critical care team.  I have reviewed this patient's available data, including medical history, events of note, physical examination and test results as part of my evaluation.

## 2012-07-16 NOTE — Evaluation (Signed)
Clinical/Bedside Swallow Evaluation Patient Details  Name: Roger Palmer MRN: [id number] Date of Birth: 08/13/1950  Today's Date: 07/16/2012 Time: 4696-2952 SLP Time Calculation (min): 22 min  Past Medical History:  Past Medical History  Diagnosis Date  . Bee sting allergy   . Hypercholesteremia   . Nephrolithiasis   . Herniated disc     c7-c7, c5-6, c3-4, c4-5  . Hypertension   . Back pain   . Obesity   . BPH (benign prostatic hypertrophy)   . Gastritis   . Reflux   . Diabetes mellitus without complication    Past Surgical History:  Past Surgical History  Procedure Laterality Date  . Inguinal hernia repair      right  . Kidney stones      x2 Dr. Earlene Plater   . Orif r elbow  07/12/2012  . Orif r wirst  07/12/2012  . Open reduction internal fixation (orif) distal radial fracture Right 07/11/2012    Procedure: OPEN REDUCTION INTERNAL FIXATION (ORIF) DISTAL RADIAL FRACTURE;  Surgeon: Marlowe Shores, MD;  Location: [facility] OR;  Service: Orthopedics;  Laterality: Right;  . Orif radial fracture Right 07/11/2012    Procedure: OPEN REDUCTION INTERNAL FIXATION (ORIF) RADIAL head FRACTURE;  Surgeon: Marlowe Shores, MD;  Location: [facility] OR;  Service: Orthopedics;  Laterality: Right;   HPI:  Patient was on a 15 foot ladder cutting tree limbs when he fell. He had no loss of consciousness. He fell on his right side. He was transported via [facility] as a level II trauma. On arrival he was evaluated by emergency department physician. He was found to have open right elbow dislocation, right distal radius fracture, right iliac and acetabular fractures. His initial hemoglobin was noted to be low and he was therefore upgraded to a level one trauma. On my arrival patient was in the CT scanner, awake and alert. In addition to the above complaints, he has some tingling in his right hand, nausea, and vomiting. Pt underwent surgery on 3/6, remained intubated post-op due to possible aspiration event. Pt was  extubated around 830 this am.    Assessment / Plan / Recommendation Clinical Impression  Pt demonstrates mild signs of acute reversible dysphagia following 2 day intubation. Pt with cough following large consecutive sips of water. With cues for small single sips there is no evidence of aspiration. Given pts adequate respiratory function, ability to cough, appropriate alertness and ability to independently follow precautions, recommend pt initiate a regular diet with thin liquids with minimal risk of aspiration. SLP will f/u in one day for tolerance. Discussed with pt and RN.     Aspiration Risk  Mild    Diet Recommendation Regular;Thin liquid   Liquid Administration via: Cup Medication Administration: Whole meds with puree Supervision: Patient able to self feed;Intermittent supervision to cue for compensatory strategies Compensations: Slow rate;Small sips/bites Postural Changes and/or Swallow Maneuvers: Seated upright 90 degrees    Other  Recommendations Oral Care Recommendations: Oral care BID   Follow Up Recommendations  None    Frequency and Duration min 2x/week  1 week   Pertinent Vitals/Pain NA    SLP Swallow Goals Patient will consume recommended diet without observed clinical signs of aspiration with: Independent assistance Swallow Study Goal #1 - Progress: Progressing toward goal Patient will utilize recommended strategies during swallow to increase swallowing safety with: Independent assistance Swallow Study Goal #2 - Progress: Progressing toward goal   Swallow Study Prior Functional Status  General HPI: Patient was on a 15 foot ladder cutting tree limbs when he fell. He had no loss of consciousness. He fell on his right side. He was transported via [facility] as a level II trauma. On arrival he was evaluated by emergency department physician. He was found to have open right elbow dislocation, right distal radius fracture, right iliac and acetabular fractures.  His initial hemoglobin was noted to be low and he was therefore upgraded to a level one trauma. On my arrival patient was in the CT scanner, awake and alert. In addition to the above complaints, he has some tingling in his right hand, nausea, and vomiting. Pt underwent surgery on 3/6, remained intubated post-op due to possible aspiration event. Pt was extubated around 830 this am.  Type of Study: Bedside swallow evaluation Diet Prior to this Study: NPO Temperature Spikes Noted: No Respiratory Status: Room air History of Recent Intubation: Yes Length of Intubations (days): 2 days Date extubated: 07/16/12 Behavior/Cognition: Alert;Cooperative;Pleasant mood Oral Cavity - Dentition: Adequate natural dentition Self-Feeding Abilities: Able to feed self Patient Positioning: Upright in bed Baseline Vocal Quality:  (mildly hoarse) Volitional Cough: Strong Volitional Swallow: Able to elicit    Oral/Motor/Sensory Function Overall Oral Motor/Sensory Function: Appears within functional limits for tasks assessed   Ice Chips     Thin Liquid Thin Liquid: Impaired Presentation: Cup;Straw Pharyngeal  Phase Impairments: Cough - Immediate (with large consecutive sips)    Nectar Thick Nectar Thick Liquid: Not tested   Honey Thick Honey Thick Liquid: Not tested   Puree Puree: Within functional limits   Solid   GO    Solid: Within functional limits      [facility], MA CCC-SLP [phone]  [name] 07/16/2012,1:47 PM

## 2012-07-16 NOTE — Progress Notes (Signed)
eLink Nursing ICU Electrolyte Replacement Protocol  Patient Name: Roger Palmer DOB: May 24, 1950 MRN: [id number]  Date of Service  07/16/2012 K+ 3.2 Not replace due to u/o 0.2ml/kg/hr  HPI/Events of Note    Recent Labs Lab 07/13/12 1655 07/14/12 0335 07/14/12 1650 07/14/12 1911 07/15/12 0124 07/15/12 0450 07/16/12 0305  NA 134* 138 137 137 137 140 138  K 3.1* 2.9* 3.4* 3.8 3.7 3.6 3.2*  CL 97 102  --   --  102 104 105  CO2 24 25  --   --  26 25 24   GLUCOSE 195* 207* 172* 195* 230* 218* 170*  BUN 8 10  --   --  17 20 37*  CREATININE 0.90 0.91  --   --  1.10 1.12 2.47*  CALCIUM 8.8 8.6  --   --  7.8* 7.9* 8.1*  MG  --   --   --   --  1.9  --   --     Estimated Creatinine Clearance: 36.5 ml/min (by C-G formula based on Cr of 2.47).  Intake/Output     03/05 0701 - 03/06 0700   I.V. (mL/kg) 2219 (21.7)   Blood 608.8   IV Piggyback 1587.5   Total Intake(mL/kg) 4415.3 (43.3)   Urine (mL/kg/hr) 680 (0.3)   Drains 25 (0)   Total Output 705   Net +3710.3        - I/O DETAILED x24h    Total I/O In: 1804.8 [I.V.:896; Blood:608.8; IV Piggyback:300] Out: 200 [Urine:200] - I/O THIS SHIFT    ASSESSMENT   eICURN Interventions     ASSESSMENT: MAJOR ELECTROLYTE    Merita Norton 07/16/2012, 4:30 AM

## 2012-07-16 NOTE — Procedures (Signed)
Extubation Procedure Note  Patient Details:   Name: Roger Palmer DOB: 1950/07/03 MRN: [id number]   Airway Documentation:     Evaluation  O2 sats: stable throughout and currently acceptable Complications: No apparent complications Patient did tolerate procedure well. Bilateral Breath Sounds: Clear;Diminished Suctioning: Airway Yes  Prior to extubation: positive cuff leak noted.  Post-extubation: pt able to cough and produce sputum, state his name, and no stridor noted.  Antoine Poche 07/16/2012, 11:09 AM

## 2012-07-16 NOTE — Progress Notes (Signed)
ANTIBIOTIC CONSULT NOTE - Follow Up  Pharmacy Consult for vancomycin, Zosyn Indication: rule out pneumonia  No Known Allergies  Patient Measurements: Height: 5\' 9"  (175.3 cm) Weight: 225 lb (102.059 kg) IBW/kg (Calculated) : 70.7  Vital Signs: Temp: 98.2 F (36.8 C) (03/06 0840) Temp src: Oral (03/06 0840) BP: 100/48 mmHg (03/06 0700) Pulse Rate: 97 (03/06 0700) Intake/Output from previous day: 03/05 0701 - 03/06 0700 In: 4913.9 [I.V.:2667.7; Blood:608.8; IV Piggyback:1637.5] Out: 905 [Urine:730; Emesis/NG output:150; Drains:25]   Labs:  Recent Labs  07/15/12 0124 07/15/12 0450 07/15/12 1451 07/16/12 0305  WBC 14.1* 10.5 8.2 10.4  HGB 8.5* 7.8* 6.4* 8.5*  PLT 170 152 140* 157  CREATININE 1.10 1.12  --  2.47*   Estimated Creatinine Clearance: 36.5 ml/min (by C-G formula based on Cr of 2.47).  Microbiology: Recent Results (from the past 720 hour(s))  MRSA PCR SCREENING     Status: None   Collection Time    07/12/12 12:52 AM      Result Value Range Status   MRSA by PCR NEGATIVE  NEGATIVE Final   Comment:            The GeneXpert MRSA Assay (FDA     approved for NASAL specimens     only), is one component of a     comprehensive MRSA colonization     surveillance program. It is not     intended to diagnose MRSA     infection nor to guide or     monitor treatment for     MRSA infections.  CULTURE, BLOOD (ROUTINE X 2)     Status: None   Collection Time    07/15/12  1:20 AM      Result Value Range Status   Specimen Description BLOOD LEFT WRIST   Final   Special Requests     Final   Value: BOTTLES DRAWN AEROBIC AND ANAEROBIC 10CC AEROBIC 7CC ANAEROBIC   Culture  Setup Time 07/15/2012 08:23   Final   Culture     Final   Value:        BLOOD CULTURE RECEIVED NO GROWTH TO DATE CULTURE WILL BE HELD FOR 5 DAYS BEFORE ISSUING A FINAL NEGATIVE REPORT   Report Status PENDING   Incomplete  CULTURE, BLOOD (ROUTINE X 2)     Status: None   Collection Time    07/15/12   1:30 AM      Result Value Range Status   Specimen Description BLOOD LEFT HAND   Final   Special Requests BOTTLES DRAWN AEROBIC ONLY 7CC   Final   Culture  Setup Time 07/15/2012 08:23   Final   Culture     Final   Value:        BLOOD CULTURE RECEIVED NO GROWTH TO DATE CULTURE WILL BE HELD FOR 5 DAYS BEFORE ISSUING A FINAL NEGATIVE REPORT   Report Status PENDING   Incomplete  CULTURE, RESPIRATORY (NON-EXPECTORATED)     Status: None   Collection Time    07/15/12  3:35 AM      Result Value Range Status   Specimen Description TRACHEAL ASPIRATE   Final   Special Requests Normal   Final   Gram Stain     Final   Value: MODERATE WBC PRESENT,BOTH PMN AND MONONUCLEAR     NO SQUAMOUS EPITHELIAL CELLS SEEN     NO ORGANISMS SEEN   Culture MODERATE YEAST CONSISTENT WITH CANDIDA SPECIES   Final   Report Status PENDING  Incomplete    Medical History: Past Medical History  Diagnosis Date  . Bee sting allergy   . Hypercholesteremia   . Nephrolithiasis   . Herniated disc     c7-c7, c5-6, c3-4, c4-5  . Hypertension   . Back pain   . Obesity   . BPH (benign prostatic hypertrophy)   . Gastritis   . Reflux   . Diabetes mellitus without complication    Medications:  Scheduled:  . piperacillin-tazobactam (ZOSYN)  IV  3.375 g Intravenous Q8H  . vancomycin  1,250 mg Intravenous Q24H   Assessment: 62 yo male s/p ORIF right acetabular fracture remains intubated.  He was placed on empiric Vancomycin and Zosyn for possible pneumonia.  He has had a bump in his creatinine (1.1 >> 2.4).  His UOP is down some at 0.48ml/kg/hr.  He will start to accumulate his Vancomycin with this degree of renal change.  We will hold his dose this morning, change him to a every 24 hour regimen for now.  Check renal function and steady state levels.   Goal of Therapy:  Vancomycin trough level 15-20 mcg/ml  Plan:  1. Zosyn 3.375gm IV Q8H (4 hr infusion). 2. Change regimen to Vancomycin 1.5 gm IV every 24 hours.  3.  Consider vancomycin trough early in therapy.   Nadara Mustard, PharmD., MS Clinical Pharmacist Pager:  [phone] Thank you for allowing pharmacy to be part of this patients care team. 07/16/2012,9:13 AM

## 2012-07-16 NOTE — Progress Notes (Signed)
Orthopaedic Trauma Service Progress Note  Subjective   Pt doing well  Extubated  Sitting up in bed   States R hip and R arm pain tolerable     Denies numbness or tingling  No CP   Did receive blood yesterday  Objective  BP 101/52  Pulse 114  Temp(Src) 98.3 F (36.8 C) (Oral)  Resp 26  Ht 5\' 9"  (1.753 m)  Wt 102.059 kg (225 lb)  BMI 33.21 kg/m2  SpO2 97%  JP drain with approx 25 cc output today   CBC    Component Value Date/Time   WBC 11.7* 07/16/2012 1822   RBC 2.87* 07/16/2012 1822   HGB 8.7* 07/16/2012 1822   HCT 24.1* 07/16/2012 1822   PLT 173 07/16/2012 1822   MCV 84.0 07/16/2012 1822   MCH 30.3 07/16/2012 1822   MCHC 36.1* 07/16/2012 1822   RDW 14.8 07/16/2012 1822   LYMPHSABS 1.6 07/16/2012 0305   MONOABS 1.3* 07/16/2012 0305   EOSABS 0.3 07/16/2012 0305   BASOSABS 0.0 07/16/2012 0305    BMET    Component Value Date/Time   NA 143 07/16/2012 1822   K 3.1* 07/16/2012 1822   CL 108 07/16/2012 1822   CO2 22 07/16/2012 1822   GLUCOSE 152* 07/16/2012 1822   BUN 44* 07/16/2012 1822   CREATININE 3.54* 07/16/2012 1822   CALCIUM 8.2* 07/16/2012 1822   GFRNONAA 17* 07/16/2012 1822   GFRAA 20* 07/16/2012 1822    Exam  Gen: awake and alert, NAD, appears comfortable Abd: soft, NT, + BS Pelvis: dressings c/d/i, drain patent Ext      Right Lower Extremity  DPN, SPN, TN, LFCN sensation intact   EHL, FHL, AT, PT, peroneals, gastroc motor intact  Ext warm  + DP pulse  Swelling stable    Assessment and Plan  62 y/o male s/p fall   1. Fall 2. R anterior column/anterior wall acetabulum fx: OTA 62-A3             POD 2                         TDWB R leg                         No formal hip precautions                         PT/OT consults when stable per TS                         Dressing change tomorrow, will remove JP tomorrow as well                                                          3. R distal humerus fx and R distal radius fx             Per Dr. Mina Marble 4. ABL anemia        s/p 2 units PRBC yesterday  Stable at current time  Monitor               5. DVT/PE prophylaxis           Lovenox  Will likely start  coumadin tomorrow if ok with TS  6. FEN             per TS 7. Dispo           dressing change tomorrow  Therapies likely to begin tomorrow  Mearl Latin, PA-C [facility] [phone] (P) 07/16/2012 8:02 PM

## 2012-07-16 NOTE — Progress Notes (Signed)
Patient ID: Roger Palmer, male   DOB: 03-06-1951, 62 y.o.   MRN: [id number] Doing well post-extubation.  Will have speech eval and hopefully begin diet. Violeta Gelinas, MD, MPH, FACS Pager: [phone]

## 2012-07-17 ENCOUNTER — Inpatient Hospital Stay (HOSPITAL_COMMUNITY): Payer: 59

## 2012-07-17 LAB — BASIC METABOLIC PANEL
CO2: 21 mEq/L (ref 19–32)
Calcium: 8 mg/dL — ABNORMAL LOW (ref 8.4–10.5)
Creatinine, Ser: 4.15 mg/dL — ABNORMAL HIGH (ref 0.50–1.35)

## 2012-07-17 LAB — GLUCOSE, CAPILLARY
Glucose-Capillary: 158 mg/dL — ABNORMAL HIGH (ref 70–99)
Glucose-Capillary: 152 mg/dL — ABNORMAL HIGH (ref 70–99)
Glucose-Capillary: 169 mg/dL — ABNORMAL HIGH (ref 70–99)

## 2012-07-17 LAB — CBC
MCH: 30.1 pg (ref 26.0–34.0)
MCV: 86.1 fL (ref 78.0–100.0)
Platelets: 192 10*3/uL (ref 150–400)
RBC: 2.66 MIL/uL — ABNORMAL LOW (ref 4.22–5.81)

## 2012-07-17 LAB — CDS SEROLOGY

## 2012-07-17 LAB — CULTURE, RESPIRATORY W GRAM STAIN

## 2012-07-17 MED ORDER — METOCLOPRAMIDE HCL 5 MG/ML IJ SOLN
10.0000 mg | Freq: Four times a day (QID) | INTRAMUSCULAR | Status: DC
  Administered 2012-07-17 – 2012-07-22 (×23): 10 mg via INTRAVENOUS
  Filled 2012-07-17 (×31): qty 2

## 2012-07-17 NOTE — Progress Notes (Signed)
Report called to [facility] RN.  Transported in bed on monitor to 5N bed 30 with RN.   VSS. No distress noted.

## 2012-07-17 NOTE — Progress Notes (Signed)
Physical Therapy Evaluation Patient Details Name: Roger Palmer MRN: [id number] DOB: Nov 03, 1950 Today's Date: 07/17/2012 Time: 4098-1191 PT Time Calculation (min): 29 min  PT Assessment / Plan / Recommendation Clinical Impression  pt adm after fall sustaining R femur and radial fx's, s/p IM Nailing R femur and closed reduction of elbow.  Pt can benefit from PT to maximize idependence.    PT Assessment  Patient needs continued PT services    Follow Up Recommendations  CIR    Does the patient have the potential to tolerate intense rehabilitation      Barriers to Discharge None (ramp being built)      Equipment Recommendations  Wheelchair (measurements PT);Wheelchair cushion (measurements PT)    Recommendations for Other Services Rehab consult   Frequency Min 3X/week    Precautions / Restrictions Restrictions Weight Bearing Restrictions: Yes RUE Weight Bearing: Non weight bearing RLE Weight Bearing: Touchdown weight bearing   Pertinent Vitals/Pain       Mobility  Bed Mobility Bed Mobility: Not assessed Transfers Transfers: Sit to Stand;Stand to Sit Sit to Stand: 1: +2 Total assist;With upper extremity assist;From chair/3-in-1 Sit to Stand: Patient Percentage: 40% Stand to Sit: 1: +2 Total assist;With upper extremity assist;To chair/3-in-1 Stand to Sit: Patient Percentage: 40% Ambulation/Gait Ambulation/Gait Assistance: Not tested (comment) Stairs: No Wheelchair Mobility Wheelchair Mobility: No    Exercises Total Joint Exercises Ankle Circles/Pumps: AROM;20 reps;Seated Short Arc Quad: AROM;10 reps;Right;Seated Heel Slides: AAROM;Right;10 reps;Seated   PT Diagnosis: Generalized weakness;Acute pain  PT Problem List: Decreased strength;Decreased activity tolerance;Decreased mobility;Decreased knowledge of use of DME;Pain;Decreased range of motion PT Treatment Interventions: Functional mobility training;Therapeutic activities;Therapeutic exercise;Patient/family  education   PT Goals Acute Rehab PT Goals PT Goal Formulation: With patient Time For Goal Achievement: 07/31/12 Potential to Achieve Goals: Good Pt will go Supine/Side to Sit: with supervision;with HOB 0 degrees PT Goal: Supine/Side to Sit - Progress: Goal set today Pt will go Sit to Supine/Side: with supervision;with HOB 0 degrees PT Goal: Sit to Supine/Side - Progress: Goal set today Pt will Transfer Bed to Chair/Chair to Bed: with min assist PT Transfer Goal: Bed to Chair/Chair to Bed - Progress: Goal set today Pt will Perform Home Exercise Program: with supervision, verbal cues required/provided PT Goal: Perform Home Exercise Program - Progress: Goal set today  Visit Information  Last PT Received On: 07/31/12 Assistance Needed: +2    Subjective Data  Subjective: I have more respect for ladders now. Patient Stated Goal: Back Independent   Prior Functioning  Home Living Lives With: Spouse Available Help at Discharge: Family (daughter close by) Type of Home: House Home Access: Stairs to enter;Other (comment) (friends buiding a ramp) Home Layout: One level Bathroom Shower/Tub: Tub/shower unit;Tub only;Walk-in shower Bathroom Toilet: Handicapped height Bathroom Accessibility:  (pt thinks w/c accessible) Home Adaptive Equipment: Bedside commode/3-in-1 (may have w/c in the family) Prior Function Level of Independence: Independent Able to Take Stairs?: Yes Driving: Yes Vocation: Full time employment Communication Communication: No difficulties Dominant Hand: Left    Cognition  Cognition Overall Cognitive Status: Appears within functional limits for tasks assessed/performed Arousal/Alertness: Awake/alert Orientation Level: Appears intact for tasks assessed Behavior During Session: [facility] for tasks performed    Extremity/Trunk Assessment Right Upper Extremity Assessment RUE ROM/Strength/Tone:  (AROM at shd and fingers) Left Upper Extremity Assessment LUE  ROM/Strength/Tone: Within functional levels Right Lower Extremity Assessment RLE ROM/Strength/Tone: Deficits RLE ROM/Strength/Tone Deficits: AAROM  in available range, grossly 2+/3- Left Lower Extremity Assessment LLE ROM/Strength/Tone: Within functional levels Trunk  Assessment Trunk Assessment: Normal   Balance Balance Balance Assessed: Yes Static Sitting Balance Static Sitting - Balance Support: Feet supported;Left upper extremity supported Static Sitting - Level of Assistance: 5: Stand by assistance  End of Session PT - End of Session Activity Tolerance: Patient tolerated treatment well Patient left: in chair;with call bell/phone within reach Nurse Communication: Mobility status;Need for lift equipment  GP     Mottinger, Eliseo Gum 07/17/2012, 2:51 PM  07/17/2012  [place] Bing, PT [phone] (pager)

## 2012-07-17 NOTE — Progress Notes (Signed)
Trauma Service Note  Subjective: Patient feels well.  Have regular diet last night.  Abdomen feels tight.  Urine output okay, but function deteriorating.  Objective: Vital signs in last 24 hours: Temp:  [98.2 F (36.8 C)-98.4 F (36.9 C)] 98.4 F (36.9 C) (03/07 0353) Pulse Rate:  [94-119] 94 (03/07 0600) Resp:  [13-26] 17 (03/07 0600) BP: (87-130)/(46-70) 122/62 mmHg (03/07 0600) SpO2:  [95 %-100 %] 97 % (03/07 0600) FiO2 (%):  [40 %] 40 % (03/06 0800) Last BM Date: 07/14/12  Intake/Output from previous day: 03/06 0701 - 03/07 0700 In: 3425 [I.V.:2775; IV Piggyback:650] Out: 2495 [Urine:2495] Intake/Output this shift:    General: No acute distress  Lungs: Clear to auscultation  Abd: Distended, hypoactive bowel sounds.  No peritonitis.  Very tight.  Extremities: Edematous and good pulses  Neuro: Intact  Lab Results: CBC   Recent Labs  07/16/12 1822 07/17/12 0345  WBC 11.7* 12.9*  HGB 8.7* 8.0*  HCT 24.1* 22.9*  PLT 173 192   BMET  Recent Labs  07/16/12 1822 07/17/12 0345  NA 143 142  K 3.1* 3.2*  CL 108 109  CO2 22 21  GLUCOSE 152* 175*  BUN 44* 49*  CREATININE 3.54* 4.15*  CALCIUM 8.2* 8.0*   PT/INR  Recent Labs  07/15/12 1010  LABPROT 15.0  INR 1.20   ABG  Recent Labs  07/15/12 0140 07/16/12 0530  PHART 7.419 7.433  HCO3 23.3 23.9    Studies/Results: Dg Chest Port 1 View  07/16/2012  *RADIOLOGY REPORT*  Clinical Data: Pneumonia, evaluate endotracheal tube positioning  PORTABLE CHEST - 1 VIEW  Comparison: 07/15/2012; 07/14/2012; 07/11/2012; chest CT - 07/12/2022  Findings: Grossly unchanged cardiac silhouette and mediastinal contours.  Stable position of support apparatus.  Lung volumes remain persistently reduced.  Grossly unchanged right perihilar and left basilar soft retrocardiac heterogeneous air space opacities. No definite evidence of edema.  No definite pleural effusion or pneumothorax.  Unchanged bones.  IMPRESSION: 1.  Stable  positioning of support apparatus.  No pneumothorax. 2.  Grossly unchanged findings worrisome for multifocal infection.   Original Report Authenticated By: Tacey Ruiz, MD     Anti-infectives: Anti-infectives   Start     Dose/Rate Route Frequency Ordered Stop   07/16/12 2200  vancomycin (VANCOCIN) 1,250 mg in sodium chloride 0.9 % 250 mL IVPB  Status:  Discontinued     1,250 mg 166.7 mL/hr over 90 Minutes Intravenous Every 24 hours 07/16/12 0911 07/16/12 0922   07/16/12 2200  vancomycin (VANCOCIN) 1,500 mg in sodium chloride 0.9 % 500 mL IVPB     1,500 mg 250 mL/hr over 120 Minutes Intravenous Every 24 hours 07/16/12 0922     07/15/12 1000  vancomycin (VANCOCIN) 1,250 mg in sodium chloride 0.9 % 250 mL IVPB  Status:  Discontinued     1,250 mg 166.7 mL/hr over 90 Minutes Intravenous Every 12 hours 07/15/12 0105 07/16/12 0911   07/15/12 0200  piperacillin-tazobactam (ZOSYN) IVPB 3.375 g     3.375 g 12.5 mL/hr over 240 Minutes Intravenous 3 times per day 07/15/12 0105     07/15/12 0130  vancomycin (VANCOCIN) 2,000 mg in sodium chloride 0.9 % 500 mL IVPB     2,000 mg 250 mL/hr over 120 Minutes Intravenous  Once 07/15/12 0105 07/15/12 0338   07/12/12 0600  ceFAZolin (ANCEF) IVPB 1 g/50 mL premix  Status:  Discontinued     1 g 100 mL/hr over 30 Minutes Intravenous 3 times per day 07/11/12  2242 07/14/12 2218   07/11/12 1615  ceFAZolin (ANCEF) IVPB 1 g/50 mL premix     1 g 100 mL/hr over 30 Minutes Intravenous  Once 07/11/12 1609 07/11/12 1700      Assessment/Plan: s/p Procedure(s): OPEN REDUCTION INTERNAL FIXATION (ORIF) ACETABULAR FRACTURE Continue foley due to strict I&O and urinary output monitoring Will get nephrology consultation. Check KUB for possible ileus, may need to decrease diet. Transfer to SDU later today.  LOS: 6 days   Marta Lamas. Gae Bon, MD, FACS [phone] Trauma Surgeon 07/17/2012

## 2012-07-17 NOTE — Progress Notes (Signed)
Speech Language Pathology Dysphagia Treatment Patient Details Name: Roger Palmer MRN: [id number] DOB: [birth year] Today's Date: 07/17/2012 Time: 4098-1191 SLP Time Calculation (min): 10 min  Assessment / Plan / Recommendation Clinical Impression  Pt continues to demonstrate evidence of decreased airway protection including immediate throat clear following large consecutive sips. The pt did not recall previously recommended precautions and did confirm intermittent coughing episode with liquid diet. Again when pt consistently takes small single straw sips, there is no sensation of penetration/aspiration. Again discussed precautions with pt and RN and reiterated raionale for precautions (risk of aspiration). Pt verbalized understanding and demonstrated strategies. Continue to recommend pt continue thin liquids as pt is likely expelling pneetrate aspriation with effective cough and also demonstrated ability to better follow precautions with reinforcement. Will continue to follow.      Diet Recommendation  Continue with Current Diet: Thin liquid    SLP Plan Continue with current plan of care   Pertinent Vitals/Pain NA   Swallowing Goals  SLP Swallowing Goals Patient will consume recommended diet without observed clinical signs of aspiration with: Independent assistance Swallow Study Goal #1 - Progress: Progressing toward goal Patient will utilize recommended strategies during swallow to increase swallowing safety with: Independent assistance Swallow Study Goal #2 - Progress: Progressing toward goal  General Temperature Spikes Noted: No Respiratory Status: Room air Behavior/Cognition: Alert;Cooperative;Pleasant mood Oral Cavity - Dentition: Adequate natural dentition Patient Positioning: Upright in chair  Oral Cavity - Oral Hygiene Does patient have any of the following "at risk" factors?: Other - dysphagia Patient is AT RISK - Oral Care Protocol followed (see row info): Yes   Dysphagia  Treatment Treatment focused on: Skilled observation of diet tolerance;Utilization of compensatory strategies Treatment Methods/Modalities: Skilled observation Patient observed directly with PO's: Yes Type of PO's observed: Thin liquids Feeding: Needs assist Liquids provided via: Straw Pharyngeal Phase Signs & Symptoms: Immediate throat clear Type of cueing: Verbal Amount of cueing: Minimal   GO    Harlon Ditty, MA CCC-SLP [phone]  [name] 07/17/2012, 11:56 AM

## 2012-07-17 NOTE — Progress Notes (Signed)
Orthopaedic Trauma Service Progress Note  Subjective  Doing well  Sitting in bedside chair No new ortho complaints  Objective  BP 119/64  Pulse 91  Temp(Src) 98 F (36.7 C) (Oral)  Resp 20  Ht 5\' 9"  (1.753 m)  Wt 102.059 kg (225 lb)  BMI 33.21 kg/m2  SpO2 99%  Intake/Output     03/06 0701 - 03/07 0700 03/07 0701 - 03/08 0700   I.V. (mL/kg) 2900 (28.4) 447.5 (4.4)   Blood     IV Piggyback 650    Total Intake(mL/kg) 3550 (34.8) 447.5 (4.4)   Urine (mL/kg/hr) 2495 (1) 590 (1.1)   Emesis/NG output     Drains     Total Output 2495 590   Net +1055 -142.5           CBC    Component Value Date/Time   WBC 12.9* 07/17/2012 0345   RBC 2.66* 07/17/2012 0345   HGB 8.0* 07/17/2012 0345   HCT 22.9* 07/17/2012 0345   PLT 192 07/17/2012 0345   MCV 86.1 07/17/2012 0345   MCH 30.1 07/17/2012 0345   MCHC 34.9 07/17/2012 0345   RDW 14.6 07/17/2012 0345   LYMPHSABS 1.6 07/16/2012 0305   MONOABS 1.3* 07/16/2012 0305   EOSABS 0.3 07/16/2012 0305   BASOSABS 0.0 07/16/2012 0305    BMET    Component Value Date/Time   NA 142 07/17/2012 0345   K 3.2* 07/17/2012 0345   CL 109 07/17/2012 0345   CO2 21 07/17/2012 0345   GLUCOSE 175* 07/17/2012 0345   BUN 49* 07/17/2012 0345   CREATININE 4.15* 07/17/2012 0345   CALCIUM 8.0* 07/17/2012 0345   GFRNONAA 14* 07/17/2012 0345   GFRAA 16* 07/17/2012 0345    Exam  Gen: sitting in bedside chair, appears well, NAD Abd: decreased bowel sounds, NT Pelvis: wounds look good  Drain removed w/o difficulty Ext      Right Lower Extremity             DPN, SPN, TN, LFCN sensation intact             EHL, FHL, AT, PT, peroneals, gastroc motor intact             Ext warm             + DP pulse             Swelling stable   Assessment and Plan  62 y/o male s/p fall   1. Fall 2. R anterior column/anterior wall acetabulum fx: OTA 62-A3             POD 3                         TDWB R leg                         No formal hip precautions                         PT/OT consults  when stable per TS                         Dressing changed and JP pulled   Dressing changes as needed  3. R distal humerus fx and R distal radius fx             Per Dr. Mina Marble 4. ABL anemia             Stable at current time             Monitor                5. DVT/PE prophylaxis         continue with Lovenox for now             6. FEN             per TS  Neph consult for elevated Cr  KUB suggestive of ileus, diet changed to liquids   Minimize narcotics    ? Also if DM contributing to ileus as well 7. Dispo           therapies  Ortho issues currently stable  Will see on Monday, contact weekend coverage with questions or concerns  Mearl Latin, PA-C [facility] [phone] (P) 07/17/2012 12:30 PM

## 2012-07-17 NOTE — Progress Notes (Signed)
[facility] Nursing ICU Electrolyte Replacement Protocol  Patient Name: Roger Palmer DOB: [birth year] MRN: [id number]  Date of Service  07/17/2012 K+ 3.2 Not replaced per protocol due to renal status criteria not met  HPI/Events of Note    Recent Labs Lab 07/14/12 1911 07/15/12 0124 07/15/12 0450 07/16/12 0305 07/16/12 1822 07/17/12 0345  NA 137 137 140 138 143 142  K 3.8 3.7 3.6 3.2* 3.1* 3.2*  CL  --  102 104 105 108 109  CO2  --  26 25 24 22 21   GLUCOSE 195* 230* 218* 170* 152* 175*  BUN  --  17 20 37* 44* 49*  CREATININE  --  1.10 1.12 2.47* 3.54* 4.15*  CALCIUM  --  7.8* 7.9* 8.1* 8.2* 8.0*  MG  --  1.9  --   --   --   --     Estimated Creatinine Clearance: 21.7 ml/min (by C-G formula based on Cr of 4.15).  Intake/Output     03/06 0701 - 03/07 0700   I.V. (mL/kg) 2400 (23.5)   IV Piggyback 600   Total Intake(mL/kg) 3000 (29.4)   Urine (mL/kg/hr) 1795 (0.7)   Total Output 1795   Net +1205        - I/O DETAILED x24h    Total I/O In: 1550 [I.V.:1000; IV Piggyback:550] Out: 550 [Urine:550] - I/O THIS SHIFT    ASSESSMENT   eICURN Interventions     ASSESSMENT: MAJOR ELECTROLYTE    Merita Norton 07/17/2012, 6:03 AM

## 2012-07-17 NOTE — Consult Note (Signed)
[facility] - CONSULT NOTE Resident Note    Please see below for attending addendum to resident note.   Date: 07/17/2012                  Patient Name:  Roger Palmer  MRN: [id number]  DOB: [birth year]  Age / Sex: 62 y.o., male         PCP: Rudi Heap                 Referring Physician: Jimmye Norman, MD                 Reason for Consult: Acute Renal Failure            History of Present Illness: Patient is a 62 y.o. male with a PMHx of hypertension, DM Type 2, GERD, BPH, and nephrolithiasis who was admitted to [facility] on 07/11/2012 for multiple orthopedic injuries (hip, elbow) secondary to fall from 15 foot ladder.    Medications: Outpatient medications: Prescriptions prior to admission  Medication Sig Dispense Refill  . atorvastatin (LIPITOR) 40 MG tablet Take 40 mg by mouth daily.      Marland Kitchen dutasteride (AVODART) 0.5 MG capsule Take 0.5 mg by mouth daily.        . fenofibrate micronized (LOFIBRA) 134 MG capsule Take 134 mg by mouth daily before breakfast.      . metFORMIN (GLUCOPHAGE) 500 MG tablet Take 500 mg by mouth daily.       Marland Kitchen omeprazole (PRILOSEC) 20 MG capsule Take 20 mg by mouth daily.        . quinapril (ACCUPRIL) 40 MG tablet Take 40 mg by mouth at bedtime.        . Tamsulosin HCl (FLOMAX) 0.4 MG CAPS Take 0.4 mg by mouth daily.       . valsartan-hydrochlorothiazide (DIOVAN-HCT) 160-25 MG per tablet Take 1 tablet by mouth daily.          Current medications: Current Facility-Administered Medications  Medication Dose Route Frequency Provider Last Rate Last Dose  . dextrose 5 % and 0.9% NaCl 1,000 mL with potassium chloride 20 mEq infusion   Intravenous Continuous Cherylynn Ridges, MD 75 mL/hr at 07/17/12 0827    . enoxaparin (LOVENOX) injection 30 mg  30 mg Subcutaneous Q12H Mearl Latin, PA-C   30 mg at 07/16/12 2152  . hydrochlorothiazide (HYDRODIURIL) tablet 25 mg  25 mg Oral Daily Liz Malady, MD   25 mg at 07/13/12 1106  . insulin aspart  (novoLOG) injection 0-15 Units  0-15 Units Subcutaneous Q4H Liz Malady, MD   3 Units at 07/17/12 [phone]  . metoCLOPramide (REGLAN) injection 10 mg  10 mg Intravenous Q6H Freeman Caldron, PA-C      . ondansetron [facility]) tablet 4 mg  4 mg Oral Q6H PRN Liz Malady, MD       Or  . ondansetron [facility]) injection 4 mg  4 mg Intravenous Q6H PRN Liz Malady, MD   4 mg at 07/14/12 0548  . pantoprazole (PROTONIX) EC tablet 40 mg  40 mg Oral Daily Liz Malady, MD   40 mg at 07/13/12 1105   Or  . pantoprazole (PROTONIX) injection 40 mg  40 mg Intravenous Daily Liz Malady, MD   40 mg at 07/16/12 1055  . piperacillin-tazobactam (ZOSYN) IVPB 3.375 g  3.375 g Intravenous Q8H Roxine Caddy, MD   3.375 g at 07/17/12 0552  . promethazine (PHENERGAN) injection  12.5 mg  12.5 mg Intravenous Q4H PRN Liz Malady, MD      . tamsulosin [facility]) capsule 0.4 mg  0.4 mg Oral Daily Liz Malady, MD   0.4 mg at 07/16/12 1055      Allergies: No Known Allergies    Past Medical History: Past Medical History  Diagnosis Date  . Bee sting allergy   . Hypercholesteremia   . Nephrolithiasis   . Herniated disc     c7-c7, c5-6, c3-4, c4-5  . Hypertension   . Back pain   . Obesity   . BPH (benign prostatic hypertrophy)   . Gastritis   . Reflux   . Diabetes mellitus without complication      Past Surgical History: Past Surgical History  Procedure Laterality Date  . Inguinal hernia repair      right  . Kidney stones      x2 Dr. Earlene Plater   . Orif r elbow  07/12/2012  . Orif r wirst  07/12/2012  . Open reduction internal fixation (orif) distal radial fracture Right 07/11/2012    Procedure: OPEN REDUCTION INTERNAL FIXATION (ORIF) DISTAL RADIAL FRACTURE;  Surgeon: Marlowe Shores, MD;  Location: [facility] OR;  Service: Orthopedics;  Laterality: Right;  . Orif radial fracture Right 07/11/2012    Procedure: OPEN REDUCTION INTERNAL FIXATION (ORIF) RADIAL head FRACTURE;  Surgeon: Marlowe Shores, MD;  Location: [facility] OR;  Service: Orthopedics;  Laterality: Right;     Family History: Family History  Problem Relation Age of Onset  . Heart attack Mother   . Diabetes Mother     NIDDM  . Hypertension Mother   . Hypertension Father   . Stroke Father   . Nephrolithiasis Brother      Social History:  reports that he has never smoked. He does not have any smokeless tobacco history on file. He reports that  drinks alcohol. He reports that he does not use illicit drugs.   Review of Systems:  Constitutional:  Denies fever, chills  Respiratory: Denies SOB, DOE, cough, chest tightness, and wheezing.  Cardiovascular: Denies chest pain, palpitations and leg swelling.  Gastrointestinal: Denies nausea, vomiting, abdominal pain or distension. Endorsed previous constipation but had BMx2 which he states was loose  Genitourinary: Denies dysuria, urgency, frequency, hematuria, flank pain or difficulty urinating. Endorsed having "kidney stones" multiple times  Musculoskeletal: Endorses right arm pain   Neurological: Denies dizziness, seizures, syncope, weakness, light-headedness, Endorsed right finger numbness on admission which has improved     Vital Signs: Blood pressure 130/62, pulse 94, temperature 98 F (36.7 C), temperature source Oral, resp. rate 20, height 5\' 9"  (1.753 m), weight 225 lb (102.059 kg), SpO2 98.00%.  Weight trends: Filed Weights   07/12/12 0427  Weight: 225 lb (102.059 kg)    Physical Exam: General: Well-developed, well-nourished, in no acute distress; sitting in bedside recliner Head: Normocephalic, atraumatic. Eyes: PERRLA, EOMI, No signs of anemia or jaundice. Neck: supple, no masses, no carotid Bruits, no JVD appreciated. Lungs: Normal respiratory effort. Clear to auscultation in anterior fields bilaterally from apices to bases without crackles or wheezes appreciated. Heart: normal rate, regular rhythm, normal S1 and S2, no gallop, murmur, or rubs  appreciated. Abdomen: BS normoactive. Obese, taught, non-tender.  Extremities: Trace pretibial edema, distal pulses intact Neurologic: grossly non-focal, alert and oriented x3, appropriate and cooperative throughout examination.    Lab results: Basic Metabolic Panel:  Recent Labs Lab 07/14/12 1911 07/15/12 0124  07/16/12 0305 07/16/12 1822 07/17/12 0345  NA 137 137  < > 138 143 142  K 3.8 3.7  < > 3.2* 3.1* 3.2*  CL  --  102  < > 105 108 109  CO2  --  26  < > 24 22 21   GLUCOSE 195* 230*  < > 170* 152* 175*  BUN  --  17  < > 37* 44* 49*  CREATININE  --  1.10  < > 2.47* 3.54* 4.15*  CALCIUM  --  7.8*  < > 8.1* 8.2* 8.0*  MG  --  1.9  --   --   --   --   < > = values in this interval not displayed.  Liver Function Tests:  Recent Labs Lab 07/11/12 1728 07/15/12 0124  AST 35 18  ALT 28 10  ALKPHOS 40 31*  BILITOT 0.2* 0.6  PROT 6.0 5.2*  ALBUMIN 3.2* 2.7*    CBC:  Recent Labs Lab 07/13/12 1655 07/14/12 0335  07/15/12 0450 07/15/12 1451 07/16/12 0305 07/16/12 1822 07/17/12 0345  WBC 12.3* 13.2*  < > 10.5 8.2 10.4 11.7* 12.9*  NEUTROABS  --  10.7*  --   --   --  7.2  --   --   HGB 11.0* 10.7*  < > 7.8* 6.4* 8.5* 8.7* 8.0*  HCT 31.1* 30.6*  < > 22.3* 18.3* 24.1* 24.1* 22.9*  MCV 87.6 87.7  < > 85.4 85.5 85.2 84.0 86.1  PLT 148* 163  < > 152 140* 157 173 192  < > = values in this interval not displayed.   CBG:  Recent Labs Lab 07/16/12 1552 07/16/12 1957 07/17/12 0029 07/17/12 0352 07/17/12 0803  GLUCAP 123* 134* 144* 158* 152*    Microbiology: Results for orders placed during the hospital encounter of 07/11/12  MRSA PCR SCREENING     Status: None   Collection Time    07/12/12 12:52 AM      Result Value Range Status   MRSA by PCR NEGATIVE  NEGATIVE Final   Comment:            The GeneXpert MRSA Assay (FDA     approved for NASAL specimens     only), is one component of a     comprehensive MRSA colonization     surveillance program. It is not      intended to diagnose MRSA     infection nor to guide or     monitor treatment for     MRSA infections.  CULTURE, BLOOD (ROUTINE X 2)     Status: None   Collection Time    07/15/12  1:20 AM      Result Value Range Status   Specimen Description BLOOD LEFT WRIST   Final   Special Requests     Final   Value: BOTTLES DRAWN AEROBIC AND ANAEROBIC 10CC AEROBIC 7CC ANAEROBIC   Culture  Setup Time 07/15/2012 08:23   Final   Culture     Final   Value:        BLOOD CULTURE RECEIVED NO GROWTH TO DATE CULTURE WILL BE HELD FOR 5 DAYS BEFORE ISSUING A FINAL NEGATIVE REPORT   Report Status PENDING   Incomplete  CULTURE, BLOOD (ROUTINE X 2)     Status: None   Collection Time    07/15/12  1:30 AM      Result Value Range Status   Specimen Description BLOOD LEFT HAND   Final   Special Requests BOTTLES DRAWN AEROBIC ONLY 7CC  Final   Culture  Setup Time 07/15/2012 08:23   Final   Culture     Final   Value:        BLOOD CULTURE RECEIVED NO GROWTH TO DATE CULTURE WILL BE HELD FOR 5 DAYS BEFORE ISSUING A FINAL NEGATIVE REPORT   Report Status PENDING   Incomplete  CULTURE, RESPIRATORY (NON-EXPECTORATED)     Status: None   Collection Time    07/15/12  3:35 AM      Result Value Range Status   Specimen Description TRACHEAL ASPIRATE   Final   Special Requests Normal   Final   Gram Stain     Final   Value: MODERATE WBC PRESENT,BOTH PMN AND MONONUCLEAR     NO SQUAMOUS EPITHELIAL CELLS SEEN     NO ORGANISMS SEEN   Culture MODERATE YEAST CONSISTENT WITH CANDIDA SPECIES   Final   Report Status 07/17/2012 FINAL   Final    Coagulation Studies:  Recent Labs  07/15/12 1010  LABPROT 15.0  INR 1.20    Urinalysis:  Recent Labs  07/15/12 0131  COLORURINE YELLOW  LABSPEC 1.020  PHURINE 6.0  GLUCOSEU >1000*  HGBUR SMALL*  BILIRUBINUR NEGATIVE  KETONESUR NEGATIVE  PROTEINUR NEGATIVE  UROBILINOGEN 0.2  NITRITE NEGATIVE  LEUKOCYTESUR NEGATIVE      Imaging: Dg Chest Port 1 View  07/17/2012   *RADIOLOGY REPORT*  Clinical Data: Respiratory failure.  PORTABLE CHEST - 1 VIEW  Comparison: 07/16/2012 and 07/15/2012.  Findings: 5:29 hours.  Two views were obtained. Interval extubation and removal of the nasogastric tube.  Patchy bibasilar opacities are unchanged, most likely representing atelectasis.  No pneumothorax or significant pleural effusion is identified.  Heart size and mediastinal contours are stable.  There is stable mild distension of bowel within the upper abdomen.  IMPRESSION: Stable basilar airspace opacities following extubation.   Original Report Authenticated By: Carey Bullocks, M.D.    Dg Chest Port 1 View  07/16/2012  *RADIOLOGY REPORT*  Clinical Data: Pneumonia, evaluate endotracheal tube positioning  PORTABLE CHEST - 1 VIEW  Comparison: 07/15/2012; 07/14/2012; 07/11/2012; chest CT - 07/12/2022  Findings: Grossly unchanged cardiac silhouette and mediastinal contours.  Stable position of support apparatus.  Lung volumes remain persistently reduced.  Grossly unchanged right perihilar and left basilar soft retrocardiac heterogeneous air space opacities. No definite evidence of edema.  No definite pleural effusion or pneumothorax.  Unchanged bones.  IMPRESSION: 1.  Stable positioning of support apparatus.  No pneumothorax. 2.  Grossly unchanged findings worrisome for multifocal infection.   Original Report Authenticated By: Tacey Ruiz, MD    Dg Abd Portable 1v  07/17/2012  *RADIOLOGY REPORT*  Clinical Data: Postoperative abdominal distension and low abdominal pain.  PORTABLE ABDOMEN - 1 VIEW  Comparison: Pelvic radiographs 07/14/2012,  CT 07/11/2012.  Findings: 0800 hours.  There is moderate diffuse colonic distension.  There is mild central small bowel distension.  Pattern is most consistent with a postoperative ileus.  The patient has undergone right pelvic fixation.  No new osseous findings are identified.  IMPRESSION: Moderate bowel distension most consistent with a postoperative  ileus.   Original Report Authenticated By: Carey Bullocks, M.D.       Assessment & Plan:  1. Acute Renal Failure, nonoliguric secondary to ATN from contrast and hypotension in the face of ACE and ARB-cont to hold ACEi, ARB and HCTZ -defer further contrast administration  Recent Labs Lab 07/15/12 0124 07/15/12 0450 07/16/12 0305 07/16/12 1822 07/17/12 0345  CREATININE 1.10 1.12  2.47* 3.54* 4.15*     2. Nephroliathiasis: CT of Abd/Pelvis demonstrating 2mm nonobstructive renal calculus to left upper pole, 3mm calculus to left lower pole, no hydronephrosis. Pt is w/o current sympoms of colicky abdominal or dysuria. UA with hematuria and hyaline casts.  -cont to monitor -consider Urology evaluation (pt states PCP manages BPH and has not been evaluated by Urologist)   3. Benign Prostatic Hypertrophy: prostate with mild enlargement and dystrophic calcifications -cont tamsulosin per primary team  4. Right Acetabular and right iliac fracture s/p closed fixation: managed per surgery  5. Right elbow and radial head displacement s/p ORIF: managed per Surgery  6. Hypertension: normotensive, no off of bp agents ACEi, ARB, and HCTZ  7. DM Type 2: stable - check HgbA1c - cont management per primary team  CBG (last 3)   Recent Labs  07/17/12 0029 07/17/12 0352 07/17/12 0803  GLUCAP 144* 158* 152*      Patient history and plan of care reviewed with attending, Dr. Briant Cedar.   Manuela Schwartz, MD  PGYII, Internal Medicine Resident 07/17/2012, 10:23 AM I have seen and examined this patient and agree with plan per Dr Bosie Clos.  62 yo WM admitted with several injuries following fall from ladder.  He now has ARF due to ATN from contrast, hypotension in face ACE and ARB.  BP is better now.  He is now off ACE and ARB.  Not much to do now other that keep I=O, replace K as needed, avoid further nephrotoxins ( ie no toradol) and follow renal fx daily.  I discussed the potential for  hemodialysis even renal fx doesn't recover prior to uremic sxs.  I am optimistic in recovery of renal fx even if he were to require HD Dewane Timson T,MD 07/17/2012 12:34 PM

## 2012-07-18 LAB — CBC WITH DIFFERENTIAL/PLATELET
Basophils Relative: 0 % (ref 0–1)
Eosinophils Absolute: 0.3 10*3/uL (ref 0.0–0.7)
Eosinophils Relative: 2 % (ref 0–5)
Hemoglobin: 8.3 g/dL — ABNORMAL LOW (ref 13.0–17.0)
Lymphs Abs: 0.8 10*3/uL (ref 0.7–4.0)
MCH: 30.3 pg (ref 26.0–34.0)
MCHC: 35 g/dL (ref 30.0–36.0)
MCV: 86.5 fL (ref 78.0–100.0)
Monocytes Absolute: 1.7 10*3/uL — ABNORMAL HIGH (ref 0.1–1.0)
Monocytes Relative: 12 % (ref 3–12)
Neutrophils Relative %: 80 % — ABNORMAL HIGH (ref 43–77)
RBC: 2.74 MIL/uL — ABNORMAL LOW (ref 4.22–5.81)

## 2012-07-18 LAB — BASIC METABOLIC PANEL
BUN: 56 mg/dL — ABNORMAL HIGH (ref 6–23)
Calcium: 8.7 mg/dL (ref 8.4–10.5)
Creatinine, Ser: 4.66 mg/dL — ABNORMAL HIGH (ref 0.50–1.35)
GFR calc Af Amer: 14 mL/min — ABNORMAL LOW (ref >90)
GFR calc non Af Amer: 12 mL/min — ABNORMAL LOW (ref >90)
Glucose, Bld: 202 mg/dL — ABNORMAL HIGH (ref 70–99)
Potassium: 3.1 mEq/L — ABNORMAL LOW (ref 3.5–5.1)

## 2012-07-18 LAB — MAGNESIUM: Magnesium: 2.9 mg/dL — ABNORMAL HIGH (ref 1.5–2.5)

## 2012-07-18 MED ORDER — INSULIN ASPART 100 UNIT/ML ~~LOC~~ SOLN
0.0000 [IU] | Freq: Three times a day (TID) | SUBCUTANEOUS | Status: DC
  Administered 2012-07-19 (×2): 3 [IU] via SUBCUTANEOUS
  Administered 2012-07-19: 5 [IU] via SUBCUTANEOUS
  Administered 2012-07-20: 3 [IU] via SUBCUTANEOUS
  Administered 2012-07-20: 2 [IU] via SUBCUTANEOUS
  Administered 2012-07-20 – 2012-07-21 (×2): 3 [IU] via SUBCUTANEOUS
  Administered 2012-07-21 (×2): 5 [IU] via SUBCUTANEOUS
  Administered 2012-07-22 – 2012-07-23 (×5): 3 [IU] via SUBCUTANEOUS

## 2012-07-18 MED ORDER — POTASSIUM CHLORIDE CRYS ER 20 MEQ PO TBCR
40.0000 meq | EXTENDED_RELEASE_TABLET | Freq: Two times a day (BID) | ORAL | Status: AC
  Administered 2012-07-18 (×2): 40 meq via ORAL
  Filled 2012-07-18 (×2): qty 2

## 2012-07-18 MED ORDER — ENOXAPARIN SODIUM 40 MG/0.4ML ~~LOC~~ SOLN
40.0000 mg | SUBCUTANEOUS | Status: DC
  Administered 2012-07-19 – 2012-07-22 (×4): 40 mg via SUBCUTANEOUS
  Filled 2012-07-18 (×4): qty 0.4

## 2012-07-18 MED ORDER — INSULIN ASPART 100 UNIT/ML ~~LOC~~ SOLN: 0.0000 [IU] | Freq: Three times a day (TID) | SUBCUTANEOUS | Status: DC

## 2012-07-18 MED ORDER — PIPERACILLIN-TAZOBACTAM IN DEX 2-0.25 GM/50ML IV SOLN
2.2500 g | Freq: Three times a day (TID) | INTRAVENOUS | Status: DC
  Administered 2012-07-18 – 2012-07-22 (×13): 2.25 g via INTRAVENOUS
  Filled 2012-07-18 (×16): qty 50

## 2012-07-18 MED ORDER — INSULIN ASPART 100 UNIT/ML ~~LOC~~ SOLN: 0.0000 [IU] | Freq: Every day | SUBCUTANEOUS | Status: DC

## 2012-07-18 NOTE — Progress Notes (Signed)
4 Days Post-Op  Subjective: He feels OK sore all over stomach is very distended., OK on clears.  Objective: Vital signs in last 24 hours: Temp:  [97.8 F (36.6 C)-98.6 F (37 C)] 98.6 F (37 C) (03/08 0626) Pulse Rate:  [93-123] 97 (03/08 0626) Resp:  [18-20] 18 (03/08 0626) BP: (96-149)/(55-79) 149/79 mmHg (03/08 0626) SpO2:  [94 %-99 %] 99 % (03/08 0626) Last BM Date: 07/18/12 Diet: clears, afebrile, VSS, creatinine still going up, K+ is low, H/H is stable  Intake/Output from previous day: 03/07 0701 - 03/08 0700 In: 635 [I.V.:597.5; IV Piggyback:37.5] Out: 1915 [Urine:1915] Intake/Output this shift:    General appearance: alert, cooperative and no distress Resp: clear to auscultation bilaterally GI: distended ,not really tender, Dressings on both surgeries. Extremities: edema both lower legs  Lab Results:   Recent Labs  07/17/12 0345 07/18/12 0650  WBC 12.9* 13.8*  HGB 8.0* 8.3*  HCT 22.9* 23.7*  PLT 192 230    BMET  Recent Labs  07/17/12 0345 07/18/12 0650  NA 142 147*  K 3.2* 3.1*  CL 109 114*  CO2 21 20  GLUCOSE 175* 202*  BUN 49* 56*  CREATININE 4.15* 4.66*  CALCIUM 8.0* 8.7   PT/INR No results found for this basename: LABPROT, INR,  in the last 72 hours   Recent Labs Lab 07/11/12 1728 07/15/12 0124  AST 35 18  ALT 28 10  ALKPHOS 40 31*  BILITOT 0.2* 0.6  PROT 6.0 5.2*  ALBUMIN 3.2* 2.7*     Lipase  No results found for this basename: lipase     Studies/Results: Dg Chest Port 1 View  07/17/2012  *RADIOLOGY REPORT*  Clinical Data: Respiratory failure.  PORTABLE CHEST - 1 VIEW  Comparison: 07/16/2012 and 07/15/2012.  Findings: 5:29 hours.  Two views were obtained. Interval extubation and removal of the nasogastric tube.  Patchy bibasilar opacities are unchanged, most likely representing atelectasis.  No pneumothorax or significant pleural effusion is identified.  Heart size and mediastinal contours are stable.  There is stable mild  distension of bowel within the upper abdomen.  IMPRESSION: Stable basilar airspace opacities following extubation.   Original Report Authenticated By: Carey Bullocks, M.D.    Dg Abd Portable 1v  07/17/2012  *RADIOLOGY REPORT*  Clinical Data: Postoperative abdominal distension and low abdominal pain.  PORTABLE ABDOMEN - 1 VIEW  Comparison: Pelvic radiographs 07/14/2012,  CT 07/11/2012.  Findings: 0800 hours.  There is moderate diffuse colonic distension.  There is mild central small bowel distension.  Pattern is most consistent with a postoperative ileus.  The patient has undergone right pelvic fixation.  No new osseous findings are identified.  IMPRESSION: Moderate bowel distension most consistent with a postoperative ileus.   Original Report Authenticated By: Carey Bullocks, M.D.     Medications: . enoxaparin (LOVENOX) injection  40 mg Subcutaneous Q24H  . insulin aspart  0-15 Units Subcutaneous Q4H  . metoCLOPramide (REGLAN) injection  10 mg Intravenous Q6H  . pantoprazole  40 mg Oral Daily   Or  . pantoprazole (PROTONIX) IV  40 mg Intravenous Daily  . piperacillin-tazobactam (ZOSYN)  IV  2.25 g Intravenous Q8H  . potassium chloride  40 mEq Oral BID  . tamsulosin  0.4 mg Oral Daily    Assessment/Plan Fall from 15 ft ladder working on a tree. 1.  anterior column/anterior wall acetabulum fx with:  OPEN REDUCTION INTERNAL FIXATION (ORIF) ACETABULAR FRACTURE  Dr. Carola Frost 2.R distal humerus fx and R distal radius fx, s/p  Open reduction and internal fixation, displaced intra-  articular fracture dislocation right wrist, and open treatment of  fracture dislocation, right elbow with radial head replacement, and  reconstruction lateral side. 07/12/2012,  Roger Shores, MD 3.Acute renal failure with contrast and hypotension, (home use ARB and ACE) 4.Hypertension 5.AODM 6.Gastritis/Reflux 7.Hx of disc dz c3-c7 8.BPH 10. Nephrolithiasis left, and 3 mm on right 11.Post op ileus  Plan:   He is stable, still pretty much bloated.  Tolerating clears, good urine output. Continue clears, and watch renal function.         LOS: 7 days    Palmer,Roger 07/18/2012

## 2012-07-18 NOTE — Progress Notes (Signed)
Physical Therapy Treatment Patient Details Name: Roger Palmer MRN: [id number] DOB: 02/14/1951 Today's Date: 07/18/2012 Time: 4098-1191 PT Time Calculation (min): 27 min  PT Assessment / Plan / Recommendation Comments on Treatment Session  Pt transferred bed to chair today with +2 tot A, pt 50%. He is progressing well with mobility but c/o bloating, tried to maintain standing awhile and left him up in chair. He is near the point that he could use BSC for BM. Pt was slow to process information today and seems to have some STM deficits. Question whether this is new or if he hit head during fall?  PT will continue to follow.    Follow Up Recommendations  CIR     Does the patient have the potential to tolerate intense rehabilitation     Barriers to Discharge        Equipment Recommendations  Wheelchair (measurements PT);Wheelchair cushion (measurements PT)    Recommendations for Other Services Rehab consult  Frequency Min 3X/week   Plan Discharge plan remains appropriate;Frequency remains appropriate    Precautions / Restrictions Precautions Precautions: Fall Restrictions Weight Bearing Restrictions: Yes RUE Weight Bearing: Non weight bearing RLE Weight Bearing: Non weight bearing   Pertinent Vitals/Pain 5/10 left hip and UE pain, premedicated    Mobility  Bed Mobility Bed Mobility: Rolling Left;Left Sidelying to Sit Rolling Left: 2: Max assist Left Sidelying to Sit: 1: +2 Total assist Left Sidelying to Sit: Patient Percentage: 30% Details for Bed Mobility Assistance: pt moves very slowly, assistance to move right LE and pad used to get pt into full sidelying Transfers Transfers: Sit to Stand;Stand to Sit;Stand Pivot Transfers Sit to Stand: 1: +2 Total assist;From bed;With upper extremity assist Sit to Stand: Patient Percentage: 50% Stand to Sit: 1: +2 Total assist;To bed;To chair/3-in-1 Stand to Sit: Patient Percentage: 50% Stand Pivot Transfers: 1: +2 Total assist Stand  Pivot Transfers: Patient Percentage: 50% Details for Transfer Assistance: performed sit to stand 3x, pt performed 40% the first trial  but 50% next two trials. Practiced standing with crutch under left arm, stood approx 2 mins each time. Did not use crutch for SPT, used 2 person arm in arm assist. Pt has difficulty maintaining WB status. Ambulation/Gait Ambulation/Gait Assistance: Not tested (comment) Stairs: No Wheelchair Mobility Wheelchair Mobility: No    Exercises Total Joint Exercises Ankle Circles/Pumps: AROM;20 reps;Seated   PT Diagnosis:    PT Problem List:   PT Treatment Interventions:     PT Goals Acute Rehab PT Goals PT Goal Formulation: With patient Time For Goal Achievement: 07/31/12 Potential to Achieve Goals: Good Pt will go Supine/Side to Sit: with supervision;with HOB 0 degrees PT Goal: Supine/Side to Sit - Progress: Progressing toward goal Pt will go Sit to Supine/Side: with supervision;with HOB 0 degrees PT Goal: Sit to Supine/Side - Progress: Progressing toward goal Pt will Transfer Bed to Chair/Chair to Bed: with min assist PT Transfer Goal: Bed to Chair/Chair to Bed - Progress: Progressing toward goal Pt will Perform Home Exercise Program: with supervision, verbal cues required/provided PT Goal: Perform Home Exercise Program - Progress: Progressing toward goal  Visit Information  Last PT Received On: 07/18/12 Assistance Needed: +2    Subjective Data  Subjective: I feel bloated Patient Stated Goal: Back Independent   Cognition  Cognition Overall Cognitive Status: Impaired Area of Impairment: Problem solving;Memory Arousal/Alertness: Awake/alert Orientation Level: Time;Disoriented to Behavior During Session: Flat affect Memory Deficits: pt cannot recall WB status and has difficulty explaining injury Problem Solving: slow  to process and follow motor cues, also with minimal conversation Cognition - Other Comments: flat affect and offers very little  information, answers questions slowly    Balance  Balance Balance Assessed: Yes Static Sitting Balance Static Sitting - Balance Support: Feet supported;Left upper extremity supported Static Sitting - Level of Assistance: 5: Stand by assistance Static Sitting - Comment/# of Minutes: pt had difficulty getting balance upon initial sitting but once hips were brought to EOB, could maintain without physical assist Static Standing Balance Static Standing - Balance Support: Left upper extremity supported Static Standing - Level of Assistance: 3: Mod assist  End of Session PT - End of Session Equipment Utilized During Treatment: Gait belt Activity Tolerance: Patient limited by fatigue Patient left: in chair;with call bell/phone within reach Nurse Communication: Mobility status   GP   Lyanne Co, PT  Acute Rehab Services  [phone]   Lyanne Co 07/18/2012, 3:51 PM

## 2012-07-18 NOTE — Progress Notes (Signed)
[facility] - PROGRESS NOTE Resident Note   Please see below for attending addendum to resident note.  Subjective:   Feel ok. A little more swelling Denies N/V/D/Fatigue.  Objective:    Vital Signs:   Temp:  [97.8 F (36.6 C)-98.6 F (37 C)] 98.6 F (37 C) (03/08 0626) Pulse Rate:  [91-123] 97 (03/08 0626) Resp:  [18-20] 18 (03/08 0626) BP: (96-149)/(55-79) 149/79 mmHg (03/08 0626) SpO2:  [94 %-100 %] 99 % (03/08 0626) Last BM Date: 07/14/12  24-hour weight change: Weight change:   Weight trends: Filed Weights   07/12/12 0427  Weight: 225 lb (102.059 kg)    Intake/Output:  03/07 0701 - 03/08 0700 In: 635 [I.V.:597.5; IV Piggyback:37.5] Out: 1915 [Urine:1915]  Physical Exam: General: Well-developed, well-nourished, in no acute distress; sitting in bedside recliner Head: Normocephalic, atraumatic. Eyes: PERRLA, EOMI, No signs of anemia or jaundice. Neck: supple, no masses, no carotid Bruits, no JVD appreciated. Lungs: Normal respiratory effort. Clear to auscultation in anterior fields bilaterally from apices to bases without crackles or wheezes appreciated. Heart: normal rate, regular rhythm, normal S1 and S2, no gallop, murmur, or rubs appreciated. Abdomen: BS normoactive. Obese, taught, non-tender.  Extremities: 1-2+ pretibial edema, distal pulses intact Neurologic: grossly non-focal, alert and oriented x3, appropriate and cooperative throughout examination.   Labs: Basic Metabolic Panel:  Recent Labs Lab 07/14/12 1911 07/15/12 0124  07/16/12 1822 07/17/12 0345 07/18/12 0650  NA 137 137  < > 143 142 147*  K 3.8 3.7  < > 3.1* 3.2* 3.1*  CL  --  102  < > 108 109 114*  CO2  --  26  < > 22 21 20   GLUCOSE 195* 230*  < > 152* 175* 202*  BUN  --  17  < > 44* 49* 56*  CREATININE  --  1.10  < > 3.54* 4.15* 4.66*  CALCIUM  --  7.8*  < > 8.2* 8.0* 8.7  MG  --  1.9  --   --   --   --   < > = values in this interval not displayed.  Liver  Function Tests:  Recent Labs Lab 07/11/12 1728 07/15/12 0124  AST 35 18  ALT 28 10  ALKPHOS 40 31*  BILITOT 0.2* 0.6  PROT 6.0 5.2*  ALBUMIN 3.2* 2.7*   No results found for this basename: LIPASE, AMYLASE,  in the last 168 hours No results found for this basename: AMMONIA,  in the last 168 hours  CBC:  Recent Labs Lab 07/14/12 0335  07/16/12 0305 07/16/12 1822 07/17/12 0345 07/18/12 0650  WBC 13.2*  < > 10.4 11.7* 12.9* 13.8*  NEUTROABS 10.7*  --  7.2  --   --  11.0*  HGB 10.7*  < > 8.5* 8.7* 8.0* 8.3*  HCT 30.6*  < > 24.1* 24.1* 22.9* 23.7*  MCV 87.7  < > 85.2 84.0 86.1 86.5  PLT 163  < > 157 173 192 230  < > = values in this interval not displayed.  Cardiac Enzymes: No results found for this basename: CKTOTAL, CKMB, TROPONINI,  in the last 168 hours  BNP: No components found with this basename: POCBNP,   CBG:  Recent Labs Lab 07/17/12 0803 07/17/12 1132 07/17/12 1549 07/18/12 0023 07/18/12 0417  GLUCAP 152* 163* 169* 166* 176*    Microbiology: Results for orders placed during the hospital encounter of 07/11/12  MRSA PCR SCREENING     Status: None   Collection Time  07/12/12 12:52 AM      Result Value Range Status   MRSA by PCR NEGATIVE  NEGATIVE Final   Comment:            The GeneXpert MRSA Assay (FDA     approved for NASAL specimens     only), is one component of a     comprehensive MRSA colonization     surveillance program. It is not     intended to diagnose MRSA     infection nor to guide or     monitor treatment for     MRSA infections.  CULTURE, BLOOD (ROUTINE X 2)     Status: None   Collection Time    07/15/12  1:20 AM      Result Value Range Status   Specimen Description BLOOD LEFT WRIST   Final   Special Requests     Final   Value: BOTTLES DRAWN AEROBIC AND ANAEROBIC 10CC AEROBIC 7CC ANAEROBIC   Culture  Setup Time 07/15/2012 08:23   Final   Culture     Final   Value:        BLOOD CULTURE RECEIVED NO GROWTH TO DATE CULTURE  WILL BE HELD FOR 5 DAYS BEFORE ISSUING A FINAL NEGATIVE REPORT   Report Status PENDING   Incomplete  CULTURE, BLOOD (ROUTINE X 2)     Status: None   Collection Time    07/15/12  1:30 AM      Result Value Range Status   Specimen Description BLOOD LEFT HAND   Final   Special Requests BOTTLES DRAWN AEROBIC ONLY 7CC   Final   Culture  Setup Time 07/15/2012 08:23   Final   Culture     Final   Value:        BLOOD CULTURE RECEIVED NO GROWTH TO DATE CULTURE WILL BE HELD FOR 5 DAYS BEFORE ISSUING A FINAL NEGATIVE REPORT   Report Status PENDING   Incomplete  CULTURE, RESPIRATORY (NON-EXPECTORATED)     Status: None   Collection Time    07/15/12  3:35 AM      Result Value Range Status   Specimen Description TRACHEAL ASPIRATE   Final   Special Requests Normal   Final   Gram Stain     Final   Value: MODERATE WBC PRESENT,BOTH PMN AND MONONUCLEAR     NO SQUAMOUS EPITHELIAL CELLS SEEN     NO ORGANISMS SEEN   Culture MODERATE YEAST CONSISTENT WITH CANDIDA SPECIES   Final   Report Status 07/17/2012 FINAL   Final    Coagulation Studies:  Recent Labs  07/15/12 1010  LABPROT 15.0  INR 1.20    Urinalysis:    Component Value Date/Time   COLORURINE YELLOW 07/15/2012 0131   APPEARANCEUR CLEAR 07/15/2012 0131   LABSPEC 1.020 07/15/2012 0131   PHURINE 6.0 07/15/2012 0131   GLUCOSEU >1000* 07/15/2012 0131   HGBUR SMALL* 07/15/2012 0131   BILIRUBINUR NEGATIVE 07/15/2012 0131   KETONESUR NEGATIVE 07/15/2012 0131   PROTEINUR NEGATIVE 07/15/2012 0131   UROBILINOGEN 0.2 07/15/2012 0131   NITRITE NEGATIVE 07/15/2012 0131   LEUKOCYTESUR NEGATIVE 07/15/2012 0131     Imaging: Dg Chest Port 1 View  07/17/2012  *RADIOLOGY REPORT*  Clinical Data: Respiratory failure.  PORTABLE CHEST - 1 VIEW  Comparison: 07/16/2012 and 07/15/2012.  Findings: 5:29 hours.  Two views were obtained. Interval extubation and removal of the nasogastric tube.  Patchy bibasilar opacities are unchanged, most likely representing atelectasis.  No  pneumothorax or  significant pleural effusion is identified.  Heart size and mediastinal contours are stable.  There is stable mild distension of bowel within the upper abdomen.  IMPRESSION: Stable basilar airspace opacities following extubation.   Original Report Authenticated By: Carey Bullocks, M.D.    Dg Abd Portable 1v  07/17/2012  *RADIOLOGY REPORT*  Clinical Data: Postoperative abdominal distension and low abdominal pain.  PORTABLE ABDOMEN - 1 VIEW  Comparison: Pelvic radiographs 07/14/2012,  CT 07/11/2012.  Findings: 0800 hours.  There is moderate diffuse colonic distension.  There is mild central small bowel distension.  Pattern is most consistent with a postoperative ileus.  The patient has undergone right pelvic fixation.  No new osseous findings are identified.  IMPRESSION: Moderate bowel distension most consistent with a postoperative ileus.   Original Report Authenticated By: Carey Bullocks, M.D.       Medications:    Infusions: . dextrose 5 % and 0.9% NaCl 1,000 mL with potassium chloride 20 mEq infusion 75 mL/hr at 07/17/12 1000    Scheduled Medications: . enoxaparin (LOVENOX) injection  30 mg Subcutaneous Q12H  . insulin aspart  0-15 Units Subcutaneous Q4H  . metoCLOPramide (REGLAN) injection  10 mg Intravenous Q6H  . pantoprazole  40 mg Oral Daily   Or  . pantoprazole (PROTONIX) IV  40 mg Intravenous Daily  . piperacillin-tazobactam (ZOSYN)  IV  3.375 g Intravenous Q8H  . tamsulosin  0.4 mg Oral Daily    PRN Medications: ondansetron (ZOFRAN) IV, ondansetron, promethazine   Assessment/ Plan:   Patient is a 62 y.o. male with a PMHx of hypertension, DM Type 2, GERD, BPH, and nephrolithiasis who was admitted to [facility] on 07/11/2012 for multiple orthopedic injuries (hip, elbow) secondary to fall from 15 foot ladder.   1. Acute Renal Failure, nonoliguric secondary to ATN from contrast and hypotension in the face of ACE and ARB-cont to hold ACEi, ARB and HCTZ    Recent  Labs Lab 07/15/12 0450 07/16/12 0305 07/16/12 1822 07/17/12 0345 07/18/12 0650  CREATININE 1.12 2.47* 3.54* 4.15* 4.66*     -defer further contrast administration  - IVF- consider to increase IVF - UOP ~ 2000 - replace KCL  2. Nephroliathiasis: CT of Abd/Pelvis demonstrating 2mm nonobstructive renal calculus to left upper pole, 3mm calculus to left lower pole, no hydronephrosis. Pt is w/o current sympoms of colicky abdominal or dysuria. UA with hematuria and hyaline casts.  -cont to monitor  -consider Urology evaluation (pt states PCP manages BPH and has not been evaluated by Urologist)  3. Benign Prostatic Hypertrophy: prostate with mild enlargement and dystrophic calcifications  -cont tamsulosin per primary team  4. Right Acetabular and right iliac fracture s/p closed fixation: managed per surgery  5. Right elbow and radial head displacement s/p ORIF: managed per Surgery  6. Hypertension: normotensive, no off of bp agents ACEi, ARB, and HCTZ  7. DM Type 2: stable  - check HgbA1c  - cont management per primary team  8. Hypokalemia      Length of Stay: 7 days  Patient history and plan of care reviewed with attending, Dr. Briant Cedar.   Dede Query, MD  PGYII, Internal Medicine Resident 07/18/2012, 9:53 AM I have seen and examined this patient and agree with plan per Dr Ian Bushman.  Scr higher but rate of rise less.  UO excellent.  Hopefully Scr will level out in next 1-2 days.  Replace K.  Daily SCR. MATTINGLY,MICHAEL T,MD 07/18/2012 10:06 AM

## 2012-07-19 LAB — RENAL FUNCTION PANEL
Albumin: 2.3 g/dL — ABNORMAL LOW (ref 3.5–5.2)
BUN: 57 mg/dL — ABNORMAL HIGH (ref 6–23)
CO2: 19 mEq/L (ref 19–32)
Chloride: 115 mEq/L — ABNORMAL HIGH (ref 96–112)
Creatinine, Ser: 4.56 mg/dL — ABNORMAL HIGH (ref 0.50–1.35)
Glucose, Bld: 195 mg/dL — ABNORMAL HIGH (ref 70–99)
Potassium: 3.4 mEq/L — ABNORMAL LOW (ref 3.5–5.1)

## 2012-07-19 LAB — HEMOGLOBIN A1C: Hgb A1c MFr Bld: 6.5 % — ABNORMAL HIGH (ref <5.7)

## 2012-07-19 LAB — GLUCOSE, CAPILLARY: Glucose-Capillary: 185 mg/dL — ABNORMAL HIGH (ref 70–99)

## 2012-07-19 MED ORDER — POTASSIUM CHLORIDE 20 MEQ/15ML (10%) PO LIQD
40.0000 meq | Freq: Two times a day (BID) | ORAL | Status: DC
  Administered 2012-07-19 (×2): 40 meq via ORAL
  Filled 2012-07-19 (×4): qty 30

## 2012-07-19 MED ORDER — DEXTROSE-NACL 5-0.45 % IV SOLN
INTRAVENOUS | Status: DC
  Administered 2012-07-19: 100 mL/h via INTRAVENOUS
  Administered 2012-07-19 – 2012-07-20 (×2): via INTRAVENOUS

## 2012-07-19 NOTE — Progress Notes (Signed)
[facility] - PROGRESS NOTE Resident Note   Please see below for attending addendum to resident note.  Subjective:   No acute events per nursing, pt w/o complaints, states that he has been "passing gas all night", multiple family members at bedside  Objective:    Vital Signs:   Temp:  [98 F (36.7 C)-98.3 F (36.8 C)] 98 F (36.7 C) (03/09 0656) Pulse Rate:  [81-96] 81 (03/09 0656) Resp:  [18] 18 (03/09 0656) BP: (139-148)/(63-66) 143/63 mmHg (03/09 0656) SpO2:  [95 %-99 %] 95 % (03/09 0656) Last BM Date: 07/18/12  24-hour weight change: Weight change:   Weight trends: Filed Weights   07/12/12 0427  Weight: 225 lb (102.059 kg)    Intake/Output:  03/08 0701 - 03/09 0700 In: 1200 [P.O.:1200] Out: 850 [Urine:850]  Physical Exam: General: Well-developed, well-nourished, in no acute distress, lying in bed Head: Normocephalic, atraumatic. Eyes: PERRLA, EOMI, No signs of anemia or jaundice. Lungs: Normal respiratory effort. Clear to auscultation in anterior fields bilaterally from apices to bases without crackles or wheezes appreciated. Heart: normal rate, regular rhythm, normal S1 and S2, no gallop, murmur, or rubs appreciated. Abdomen: hypoactive. Obese, taught, non-tender.  GU: foley in place, clear drainage Extremities: 1-2+ pretibial edema, distal pulses intact Neurologic: grossly non-focal, alert and oriented x3, appropriate and cooperative throughout examination.   Labs: Basic Metabolic Panel:  Recent Labs Lab 07/14/12 1911 07/15/12 0124  07/17/12 0345 07/18/12 0650 07/18/12 1120 07/19/12 0645  NA 137 137  < > 142 147*  --  149*  K 3.8 3.7  < > 3.2* 3.1*  --  3.4*  CL  --  102  < > 109 114*  --  115*  CO2  --  26  < > 21 20  --  19  GLUCOSE 195* 230*  < > 175* 202*  --  195*  BUN  --  17  < > 49* 56*  --  57*  CREATININE  --  1.10  < > 4.15* 4.66*  --  4.56*  CALCIUM  --  7.8*  < > 8.0* 8.7  --  8.3*  MG  --  1.9  --   --   --  2.9*   --   PHOS  --   --   --   --   --   --  2.1*  < > = values in this interval not displayed.  Liver Function Tests:  Recent Labs Lab 07/15/12 0124 07/19/12 0645  AST 18  --   ALT 10  --   ALKPHOS 31*  --   BILITOT 0.6  --   PROT 5.2*  --   ALBUMIN 2.7* 2.3*    CBC:  Recent Labs Lab 07/14/12 0335  07/16/12 0305 07/16/12 1822 07/17/12 0345 07/18/12 0650  WBC 13.2*  < > 10.4 11.7* 12.9* 13.8*  NEUTROABS 10.7*  --  7.2  --   --  11.0*  HGB 10.7*  < > 8.5* 8.7* 8.0* 8.3*  HCT 30.6*  < > 24.1* 24.1* 22.9* 23.7*  MCV 87.7  < > 85.2 84.0 86.1 86.5  PLT 163  < > 157 173 192 230  < > = values in this interval not displayed.   CBG:  Recent Labs Lab 07/18/12 0023 07/18/12 0417 07/18/12 1605 07/18/12 2104 07/19/12 0618  GLUCAP 166* 176* 210* 198* 198*    Microbiology: Results for orders placed during the hospital encounter of 07/11/12  MRSA PCR SCREENING  Status: None   Collection Time    07/12/12 12:52 AM      Result Value Range Status   MRSA by PCR NEGATIVE  NEGATIVE Final   Comment:            The GeneXpert MRSA Assay (FDA     approved for NASAL specimens     only), is one component of a     comprehensive MRSA colonization     surveillance program. It is not     intended to diagnose MRSA     infection nor to guide or     monitor treatment for     MRSA infections.  CULTURE, BLOOD (ROUTINE X 2)     Status: None   Collection Time    07/15/12  1:20 AM      Result Value Range Status   Specimen Description BLOOD LEFT WRIST   Final   Special Requests     Final   Value: BOTTLES DRAWN AEROBIC AND ANAEROBIC 10CC AEROBIC 7CC ANAEROBIC   Culture  Setup Time 07/15/2012 08:23   Final   Culture     Final   Value:        BLOOD CULTURE RECEIVED NO GROWTH TO DATE CULTURE WILL BE HELD FOR 5 DAYS BEFORE ISSUING A FINAL NEGATIVE REPORT   Report Status PENDING   Incomplete  CULTURE, BLOOD (ROUTINE X 2)     Status: None   Collection Time    07/15/12  1:30 AM      Result  Value Range Status   Specimen Description BLOOD LEFT HAND   Final   Special Requests BOTTLES DRAWN AEROBIC ONLY 7CC   Final   Culture  Setup Time 07/15/2012 08:23   Final   Culture     Final   Value:        BLOOD CULTURE RECEIVED NO GROWTH TO DATE CULTURE WILL BE HELD FOR 5 DAYS BEFORE ISSUING A FINAL NEGATIVE REPORT   Report Status PENDING   Incomplete  CULTURE, RESPIRATORY (NON-EXPECTORATED)     Status: None   Collection Time    07/15/12  3:35 AM      Result Value Range Status   Specimen Description TRACHEAL ASPIRATE   Final   Special Requests Normal   Final   Gram Stain     Final   Value: MODERATE WBC PRESENT,BOTH PMN AND MONONUCLEAR     NO SQUAMOUS EPITHELIAL CELLS SEEN     NO ORGANISMS SEEN   Culture MODERATE YEAST CONSISTENT WITH CANDIDA SPECIES   Final   Report Status 07/17/2012 FINAL   Final     Urinalysis:    Component Value Date/Time   COLORURINE YELLOW 07/15/2012 0131   APPEARANCEUR CLEAR 07/15/2012 0131   LABSPEC 1.020 07/15/2012 0131   PHURINE 6.0 07/15/2012 0131   GLUCOSEU >1000* 07/15/2012 0131   HGBUR SMALL* 07/15/2012 0131   BILIRUBINUR NEGATIVE 07/15/2012 0131   KETONESUR NEGATIVE 07/15/2012 0131   PROTEINUR NEGATIVE 07/15/2012 0131   UROBILINOGEN 0.2 07/15/2012 0131   NITRITE NEGATIVE 07/15/2012 0131   LEUKOCYTESUR NEGATIVE 07/15/2012 0131     Imaging: No results found.    Medications:    Infusions: . dextrose 5 % and 0.9% NaCl 1,000 mL with potassium chloride 20 mEq infusion 50 mL/hr at 07/19/12 0842    Scheduled Medications: . enoxaparin (LOVENOX) injection  40 mg Subcutaneous Q24H  . insulin aspart  0-15 Units Subcutaneous TID WC  . insulin aspart  0-5 Units Subcutaneous QHS  .  metoCLOPramide (REGLAN) injection  10 mg Intravenous Q6H  . pantoprazole  40 mg Oral Daily   Or  . pantoprazole (PROTONIX) IV  40 mg Intravenous Daily  . piperacillin-tazobactam (ZOSYN)  IV  2.25 g Intravenous Q8H  . tamsulosin  0.4 mg Oral Daily    PRN Medications: ondansetron  (ZOFRAN) IV, ondansetron, promethazine   Assessment/ Plan:   Patient is a 62 y.o. male with a PMHx of hypertension, DM Type 2, GERD, BPH, and nephrolithiasis who was admitted to [facility] on 07/11/2012 for multiple orthopedic injuries (hip, elbow) secondary to fall from 15 foot ladder.   1. Acute Renal Failure, nonoliguric: secondary to ATN from contrast and hypotension in the setting of ACE and ARB, creatinine levels appears to be on decline -cont to hold ACEi, ARB and HCTZ  -defer further contrast administration  - UOP ~ 850cc - cont to monitor BUN/creatinine level if trend towards normal likely no HD   Recent Labs Lab 07/16/12 0305 07/16/12 1822 07/17/12 0345 07/18/12 0650 07/19/12 0645  CREATININE 2.47* 3.54* 4.15* 4.66* 4.56*    Recent Labs Lab 07/16/12 0305 07/16/12 1822 07/17/12 0345 07/18/12 0650 07/19/12 0645  K 3.2* 3.1* 3.2* 3.1* 3.4*    2. Nephroliathiasis: CT of Abd/Pelvis demonstrating 2mm nonobstructive renal calculus to left upper pole, 3mm calculus to left lower pole, no hydronephrosis. Pt is w/o current sympoms of colicky abdominal or dysuria. UA with hematuria and hyaline casts.  -cont to monitor  -consider Urology evaluation (pt states PCP manages BPH and has not been evaluated by Urologist)   3. Benign Prostatic Hypertrophy: prostate with mild enlargement and dystrophic calcifications  -cont tamsulosin per primary team   4. Right Acetabular and right iliac fracture s/p closed fixation: managed per surgery   5. Right elbow and radial head displacement s/p ORIF: managed per Surgery   6. Hypertension: normotensive, off of bp agents ACEi, ARB, and HCTZ   7. DM Type 2: mildly elevated cbgs, HgbA1c pending - cont management per primary team  CBG (last 3)   Recent Labs  07/18/12 1605 07/18/12 2104 07/19/12 0618  GLUCAP 210* 198* 198*    8. Hypokalemia: supplement as needed   Recent Labs Lab 07/16/12 0305 07/16/12 1822 07/17/12 0345  07/18/12 0650 07/19/12 0645  K 3.2* 3.1* 3.2* 3.1* 3.4*     Length of Stay: 8 days  Patient history and plan of care reviewed with attending, Dr. Briant Cedar.   Manuela Schwartz, MD  PGYII, Internal Medicine Resident 07/19/2012, 9:07 AM I have seen and examined this patient and agree with plan per Dr Bosie Clos.  Renal fx seems to have peaked.  UO good and SNa increasing so will change to 1/2 NS.  Expect renal fx to cont to improve.  Replace K orally.  MATTINGLY,MICHAEL T,MD 07/19/2012 10:11 AM

## 2012-07-19 NOTE — Progress Notes (Signed)
5 Days Post-Op  Subjective: Passing gas now, tolerating liquids.  Objective: Vital signs in last 24 hours: Temp:  [98 F (36.7 C)-98.3 F (36.8 C)] 98 F (36.7 C) (03/09 0656) Pulse Rate:  [81-96] 81 (03/09 0656) Resp:  [18] 18 (03/09 0656) BP: (139-148)/(63-66) 143/63 mmHg (03/09 0656) SpO2:  [95 %-99 %] 95 % (03/09 0656) Last BM Date: 07/18/12  Intake/Output from previous day: 03/08 0701 - 03/09 0700 In: 1200 [P.O.:1200] Out: 850 [Urine:850] Intake/Output this shift:    Abdomen till protuberant but soft and non tender  Lab Results:   Recent Labs  07/17/12 0345 07/18/12 0650  WBC 12.9* 13.8*  HGB 8.0* 8.3*  HCT 22.9* 23.7*  PLT 192 230   BMET  Recent Labs  07/18/12 0650 07/19/12 0645  NA 147* 149*  K 3.1* 3.4*  CL 114* 115*  CO2 20 19  GLUCOSE 202* 195*  BUN 56* 57*  CREATININE 4.66* 4.56*  CALCIUM 8.7 8.3*   PT/INR No results found for this basename: LABPROT, INR,  in the last 72 hours ABG No results found for this basename: PHART, PCO2, PO2, HCO3,  in the last 72 hours  Studies/Results: No results found.  Anti-infectives: Anti-infectives   Start     Dose/Rate Route Frequency Ordered Stop   07/18/12 1800  piperacillin-tazobactam (ZOSYN) IVPB 2.25 g     2.25 g 100 mL/hr over 30 Minutes Intravenous Every 8 hours 07/18/12 1123     07/16/12 2200  vancomycin (VANCOCIN) 1,250 mg in sodium chloride 0.9 % 250 mL IVPB  Status:  Discontinued     1,250 mg 166.7 mL/hr over 90 Minutes Intravenous Every 24 hours 07/16/12 0911 07/16/12 0922   07/16/12 2200  vancomycin (VANCOCIN) 1,500 mg in sodium chloride 0.9 % 500 mL IVPB  Status:  Discontinued     1,500 mg 250 mL/hr over 120 Minutes Intravenous Every 24 hours 07/16/12 0922 07/17/12 0745   07/15/12 1000  vancomycin (VANCOCIN) 1,250 mg in sodium chloride 0.9 % 250 mL IVPB  Status:  Discontinued     1,250 mg 166.7 mL/hr over 90 Minutes Intravenous Every 12 hours 07/15/12 0105 07/16/12 0911   07/15/12 0200   piperacillin-tazobactam (ZOSYN) IVPB 3.375 g  Status:  Discontinued     3.375 g 12.5 mL/hr over 240 Minutes Intravenous 3 times per day 07/15/12 0105 07/18/12 1123   07/15/12 0130  vancomycin (VANCOCIN) 2,000 mg in sodium chloride 0.9 % 500 mL IVPB     2,000 mg 250 mL/hr over 120 Minutes Intravenous  Once 07/15/12 0105 07/15/12 0338   07/12/12 0600  ceFAZolin (ANCEF) IVPB 1 g/50 mL premix  Status:  Discontinued     1 g 100 mL/hr over 30 Minutes Intravenous 3 times per day 07/11/12 2242 07/14/12 2218   07/11/12 1615  ceFAZolin (ANCEF) IVPB 1 g/50 mL premix     1 g 100 mL/hr over 30 Minutes Intravenous  Once 07/11/12 1609 07/11/12 1700      Assessment/Plan: s/p Procedure(s): OPEN REDUCTION INTERNAL FIXATION (ORIF) ACETABULAR FRACTURE (Right)  Multiple trauma, renal failure, ileus  Ileus resolving.  Will leave on clears until tomorrow  LOS: 8 days    BLACKMAN,DOUGLAS A 07/19/2012

## 2012-07-19 NOTE — Progress Notes (Signed)
ANTIBIOTIC CONSULT NOTE - Follow Up  Pharmacy Consult for  Zosyn Indication: rule out pneumonia  No Known Allergies    Labs:  Recent Labs  07/16/12 1822 07/17/12 0345 07/18/12 0650 07/19/12 0645  WBC 11.7* 12.9* 13.8*  --   HGB 8.7* 8.0* 8.3*  --   PLT 173 192 230  --   CREATININE 3.54* 4.15* 4.66* 4.56*   Estimated Creatinine Clearance: 19.8 ml/min (by C-G formula based on Cr of 4.56).  Assessment: 62 yo male s/p ORIF right acetabular fracture.  Continues on Lovenox.    Zosyn continues for possible pneumonia, now with ARF with Scr up to 4.56.  Afebrile  Goal of Therapy:  Appropriate Zosyn dosing  Plan:  1. Change Zosyn to 2.25 grams iv Q 8 hours. 2. Continue to follow  Thank you. Okey Regal, PharmD [phone]  07/19/2012,11:37 AM

## 2012-07-20 ENCOUNTER — Inpatient Hospital Stay (HOSPITAL_COMMUNITY): Payer: 59

## 2012-07-20 ENCOUNTER — Encounter (HOSPITAL_COMMUNITY): Payer: Self-pay | Admitting: Orthopedic Surgery

## 2012-07-20 DIAGNOSIS — W11XXXA Fall on and from ladder, initial encounter: Secondary | ICD-10-CM

## 2012-07-20 DIAGNOSIS — S5290XA Unspecified fracture of unspecified forearm, initial encounter for closed fracture: Secondary | ICD-10-CM

## 2012-07-20 DIAGNOSIS — S52509A Unspecified fracture of the lower end of unspecified radius, initial encounter for closed fracture: Secondary | ICD-10-CM

## 2012-07-20 DIAGNOSIS — S52609A Unspecified fracture of lower end of unspecified ulna, initial encounter for closed fracture: Secondary | ICD-10-CM

## 2012-07-20 LAB — CBC WITH DIFFERENTIAL/PLATELET
Basophils Absolute: 0.1 10*3/uL (ref 0.0–0.1)
HCT: 24.2 % — ABNORMAL LOW (ref 39.0–52.0)
Lymphocytes Relative: 8 % — ABNORMAL LOW (ref 12–46)
Neutro Abs: 13.1 10*3/uL — ABNORMAL HIGH (ref 1.7–7.7)
Neutrophils Relative %: 79 % — ABNORMAL HIGH (ref 43–77)
Platelets: 269 10*3/uL (ref 150–400)
RDW: 14.9 % (ref 11.5–15.5)
WBC: 16.6 10*3/uL — ABNORMAL HIGH (ref 4.0–10.5)

## 2012-07-20 LAB — BASIC METABOLIC PANEL
CO2: 18 mEq/L — ABNORMAL LOW (ref 19–32)
Chloride: 115 mEq/L — ABNORMAL HIGH (ref 96–112)
Creatinine, Ser: 4.44 mg/dL — ABNORMAL HIGH (ref 0.50–1.35)

## 2012-07-20 LAB — PROTIME-INR
INR: 1.27 (ref 0.00–1.49)
Prothrombin Time: 15.6 seconds — ABNORMAL HIGH (ref 11.6–15.2)

## 2012-07-20 LAB — GLUCOSE, CAPILLARY
Glucose-Capillary: 193 mg/dL — ABNORMAL HIGH (ref 70–99)
Glucose-Capillary: 182 mg/dL — ABNORMAL HIGH (ref 70–99)
Glucose-Capillary: 170 mg/dL — ABNORMAL HIGH (ref 70–99)
Glucose-Capillary: 158 mg/dL — ABNORMAL HIGH (ref 70–99)
Glucose-Capillary: 187 mg/dL — ABNORMAL HIGH (ref 70–99)

## 2012-07-20 MED ORDER — TRAMADOL HCL 50 MG PO TABS
50.0000 mg | ORAL_TABLET | Freq: Four times a day (QID) | ORAL | Status: DC | PRN
  Administered 2012-07-21: 50 mg via ORAL
  Filled 2012-07-20: qty 1

## 2012-07-20 MED ORDER — POTASSIUM CHLORIDE CRYS ER 20 MEQ PO TBCR
40.0000 meq | EXTENDED_RELEASE_TABLET | Freq: Two times a day (BID) | ORAL | Status: DC
  Administered 2012-07-20 – 2012-07-23 (×7): 40 meq via ORAL
  Filled 2012-07-20 (×9): qty 2

## 2012-07-20 MED ORDER — WARFARIN - PHARMACIST DOSING INPATIENT: Freq: Every day | Status: DC

## 2012-07-20 MED ORDER — WARFARIN SODIUM 7.5 MG PO TABS
7.5000 mg | ORAL_TABLET | Freq: Once | ORAL | Status: AC
  Administered 2012-07-20: 7.5 mg via ORAL
  Filled 2012-07-20: qty 1

## 2012-07-20 MED ORDER — WARFARIN VIDEO: Freq: Once | Status: DC

## 2012-07-20 MED ORDER — COUMADIN BOOK
Freq: Once | Status: DC
  Filled 2012-07-20 (×2): qty 1

## 2012-07-20 NOTE — Progress Notes (Signed)
[facility] - PROGRESS NOTE Resident Note   Please see below for attending addendum to resident note.  Subjective:   No acute events per nursing  Objective:    Vital Signs:   Temp:  [97.9 F (36.6 C)-98.9 F (37.2 C)] 98.7 F (37.1 C) (03/10 0651) Pulse Rate:  [75-80] 80 (03/10 0651) Resp:  [18-20] 18 (03/10 0651) BP: (111-124)/(52-62) 116/52 mmHg (03/10 0651) SpO2:  [98 %] 98 % (03/10 0651) Last BM Date: 07/19/12  24-hour weight change: Weight change:   Weight trends: Filed Weights   07/12/12 0427  Weight: 102.059 kg (225 lb)    Intake/Output:  03/09 0701 - 03/10 0700 In: 2160 [P.O.:1360; I.V.:800] Out: 1400 [Urine:1400]  Physical Exam: General: Well-developed, well-nourished, in no acute distress, lying in bed Head: Normocephalic, atraumatic. Eyes: PERRLA, EOMI, No signs of anemia or jaundice. Lungs: Normal respiratory effort. Clear to auscultation in anterior fields bilaterally from apices to bases without crackles or wheezes appreciated. Heart: normal rate, regular rhythm, normal S1 and S2, no gallop, murmur, or rubs appreciated. Abdomen: hypoactive. Obese, taught, non-tender.  GU: foley in place, clear drainage Extremities: 1-2+ pretibial edema, distal pulses intact Neurologic: grossly non-focal, alert and oriented x3, appropriate and cooperative throughout examination.   Labs: Basic Metabolic Panel:  Recent Labs Lab 07/14/12 1911 07/15/12 0124  07/18/12 0650 07/18/12 1120 07/19/12 0645 07/20/12 0745  NA 137 137  < > 147*  --  149* 147*  K 3.8 3.7  < > 3.1*  --  3.4* 4.2  CL  --  102  < > 114*  --  115* 115*  CO2  --  26  < > 20  --  19 18*  GLUCOSE 195* 230*  < > 202*  --  195* 230*  BUN  --  17  < > 56*  --  57* 58*  CREATININE  --  1.10  < > 4.66*  --  4.56* 4.44*  CALCIUM  --  7.8*  < > 8.7  --  8.3* 8.4  MG  --  1.9  --   --  2.9*  --   --   PHOS  --   --   --   --   --  2.1*  --   < > = values in this interval not  displayed.  Liver Function Tests:  Recent Labs Lab 07/15/12 0124 07/19/12 0645  AST 18  --   ALT 10  --   ALKPHOS 31*  --   BILITOT 0.6  --   PROT 5.2*  --   ALBUMIN 2.7* 2.3*    CBC:  Recent Labs Lab 07/14/12 0335  07/16/12 0305 07/16/12 1822 07/17/12 0345 07/18/12 0650  WBC 13.2*  < > 10.4 11.7* 12.9* 13.8*  NEUTROABS 10.7*  --  7.2  --   --  11.0*  HGB 10.7*  < > 8.5* 8.7* 8.0* 8.3*  HCT 30.6*  < > 24.1* 24.1* 22.9* 23.7*  MCV 87.7  < > 85.2 84.0 86.1 86.5  PLT 163  < > 157 173 192 230  < > = values in this interval not displayed.   CBG:  Recent Labs Lab 07/19/12 0618 07/19/12 1121 07/19/12 1629 07/19/12 2233 07/20/12 0721  GLUCAP 198* 204* 185* 193* 182*    Microbiology: Results for orders placed during the hospital encounter of 07/11/12  MRSA PCR SCREENING     Status: None   Collection Time    07/12/12 12:52 AM  Result Value Range Status   MRSA by PCR NEGATIVE  NEGATIVE Final   Comment:            The GeneXpert MRSA Assay (FDA     approved for NASAL specimens     only), is one component of a     comprehensive MRSA colonization     surveillance program. It is not     intended to diagnose MRSA     infection nor to guide or     monitor treatment for     MRSA infections.  CULTURE, BLOOD (ROUTINE X 2)     Status: None   Collection Time    07/15/12  1:20 AM      Result Value Range Status   Specimen Description BLOOD LEFT WRIST   Final   Special Requests     Final   Value: BOTTLES DRAWN AEROBIC AND ANAEROBIC 10CC AEROBIC 7CC ANAEROBIC   Culture  Setup Time 07/15/2012 08:23   Final   Culture     Final   Value:        BLOOD CULTURE RECEIVED NO GROWTH TO DATE CULTURE WILL BE HELD FOR 5 DAYS BEFORE ISSUING A FINAL NEGATIVE REPORT   Report Status PENDING   Incomplete  CULTURE, BLOOD (ROUTINE X 2)     Status: None   Collection Time    07/15/12  1:30 AM      Result Value Range Status   Specimen Description BLOOD LEFT HAND   Final   Special  Requests BOTTLES DRAWN AEROBIC ONLY 7CC   Final   Culture  Setup Time 07/15/2012 08:23   Final   Culture     Final   Value:        BLOOD CULTURE RECEIVED NO GROWTH TO DATE CULTURE WILL BE HELD FOR 5 DAYS BEFORE ISSUING A FINAL NEGATIVE REPORT   Report Status PENDING   Incomplete  CULTURE, RESPIRATORY (NON-EXPECTORATED)     Status: None   Collection Time    07/15/12  3:35 AM      Result Value Range Status   Specimen Description TRACHEAL ASPIRATE   Final   Special Requests Normal   Final   Gram Stain     Final   Value: MODERATE WBC PRESENT,BOTH PMN AND MONONUCLEAR     NO SQUAMOUS EPITHELIAL CELLS SEEN     NO ORGANISMS SEEN   Culture MODERATE YEAST CONSISTENT WITH CANDIDA SPECIES   Final   Report Status 07/17/2012 FINAL   Final     Urinalysis:    Component Value Date/Time   COLORURINE YELLOW 07/15/2012 0131   APPEARANCEUR CLEAR 07/15/2012 0131   LABSPEC 1.020 07/15/2012 0131   PHURINE 6.0 07/15/2012 0131   GLUCOSEU >1000* 07/15/2012 0131   HGBUR SMALL* 07/15/2012 0131   BILIRUBINUR NEGATIVE 07/15/2012 0131   KETONESUR NEGATIVE 07/15/2012 0131   PROTEINUR NEGATIVE 07/15/2012 0131   UROBILINOGEN 0.2 07/15/2012 0131   NITRITE NEGATIVE 07/15/2012 0131   LEUKOCYTESUR NEGATIVE 07/15/2012 0131     Imaging: No results found.    Medications:    Infusions: . dextrose 5 % and 0.45% NaCl 100 mL/hr (07/19/12 2138)    Scheduled Medications: . coumadin book   Does not apply Once  . enoxaparin (LOVENOX) injection  40 mg Subcutaneous Q24H  . insulin aspart  0-15 Units Subcutaneous TID WC  . insulin aspart  0-5 Units Subcutaneous QHS  . metoCLOPramide (REGLAN) injection  10 mg Intravenous Q6H  . pantoprazole  40 mg Oral  Daily   Or  . pantoprazole (PROTONIX) IV  40 mg Intravenous Daily  . piperacillin-tazobactam (ZOSYN)  IV  2.25 g Intravenous Q8H  . potassium chloride  40 mEq Oral BID  . tamsulosin  0.4 mg Oral Daily  . warfarin  7.5 mg Oral ONCE-1800  . warfarin   Does not apply Once  . Warfarin -  Pharmacist Dosing Inpatient   Does not apply q1800    PRN Medications: ondansetron (ZOFRAN) IV, ondansetron, promethazine, traMADol   Assessment/ Plan:   Patient is a 62 y.o. male with a PMHx of hypertension, DM Type 2, GERD, BPH, and nephrolithiasis who was admitted to [facility] on 07/11/2012 for multiple orthopedic injuries (hip, elbow) secondary to fall from 15 foot ladder.   1. Acute Renal Failure, nonoliguric: secondary to ATN from contrast and hypotension in the setting of ACE and ARB, creatinine levels appears to be on decline - cont to hold ACEi, ARB and HCTZ  - defer further contrast administration  - UOP ~ 1400cc - cont Cr monitoring, todays level pending   Recent Labs Lab 07/16/12 1822 07/17/12 0345 07/18/12 0650 07/19/12 0645 07/20/12 0745  CREATININE 3.54* 4.15* 4.66* 4.56* 4.44*    Recent Labs Lab 07/16/12 1822 07/17/12 0345 07/18/12 0650 07/19/12 0645 07/20/12 0745  K 3.1* 3.2* 3.1* 3.4* 4.2   2. Hypernatremia: in setting of NPO status, IVF changed to 1/2 NS -Na level pending this am   Recent Labs Lab 07/16/12 1822 07/17/12 0345 07/18/12 0650 07/19/12 0645 07/20/12 0745  NA 143 142 147* 149* 147*    3. Nephroliathiasis: CT of Abd/Pelvis demonstrating 2mm nonobstructive renal calculus to left upper pole, 3mm calculus to left lower pole, no hydronephrosis. Pt is w/o current sympoms of colicky abdominal or dysuria. UA with hematuria and hyaline casts.  -cont to monitor  -consider Urology evaluation   4. Benign Prostatic Hypertrophy: prostate with mild enlargement and dystrophic calcifications  -cont tamsulosin per primary team   5. Right Acetabular and right iliac fracture s/p closed fixation: managed per surgery   6. Right elbow and radial head displacement s/p ORIF: managed per Surgery   7. Hypertension: normotensive, off of bp agents ACEi, ARB, and HCTZ   8. DM Type 2: mildly elevated cbgs, HgbA1c 6.5 - cont management per primary team  CBG  (last 3)   Recent Labs  07/19/12 1629 07/19/12 2233 07/20/12 0721  GLUCAP 185* 193* 182*    9. Hypokalemia: supplement as needed   Recent Labs Lab 07/16/12 1822 07/17/12 0345 07/18/12 0650 07/19/12 0645 07/20/12 0745  K 3.1* 3.2* 3.1* 3.4* 4.2     Length of Stay: 9 days  Patient history and plan of care reviewed with attending, Dr. Briant Cedar.   Hartley Barefoot. Allena Katz, MD  [facility], Internal Medicine Resident 07/20/2012, 9:58 AM  I have personally seen and examined this patient and agree with the assessment/plan as outlined above by Bosie Clos MD (PGY 2). Fair UOP with seemingly improving renal function. Will increase rate of NS to --- switch to hypotonic bicarbonate if acidosis worsens. PATEL,JAY K.,MD 07/20/2012 9:58 AM

## 2012-07-20 NOTE — Progress Notes (Signed)
WBC remains elevated.  Will check CT A/P to look for abscess or other issue.  Ileus also persists. Patient examined and I agree with the assessment and plan  Violeta Gelinas, MD, MPH, FACS Pager: [phone]  07/20/2012 3:15 PM

## 2012-07-20 NOTE — Progress Notes (Signed)
Physical Therapy Treatment Patient Details Name: Roger Palmer MRN: [id number] DOB: 02/12/1951 Today's Date: 07/20/2012 Time: 4098-1191 PT Time Calculation (min): 26 min  PT Assessment / Plan / Recommendation Comments on Treatment Session  pt presents with  R femur fx and R elbow fx. Pt performed SPT transfer x2 to bedside commode then to chair. Pt req verbal and tactile cues to maintain NWB status on R LE. Pt and wife inquiring about CIR. Pt open to idea. Recommend CIR for continued therapy upon acute D/C .     Follow Up Recommendations  CIR     Does the patient have the potential to tolerate intense rehabilitation     Barriers to Discharge        Equipment Recommendations  Wheelchair (measurements PT);Wheelchair cushion (measurements PT)    Recommendations for Other Services    Frequency Min 3X/week   Plan Discharge plan remains appropriate;Frequency remains appropriate    Precautions / Restrictions Precautions Precautions: Fall Restrictions Weight Bearing Restrictions: Yes RUE Weight Bearing: Non weight bearing RLE Weight Bearing: Non weight bearing   Pertinent Vitals/Pain No pain reported. Pt states he just feels "bloated"    Mobility  Bed Mobility Bed Mobility: Rolling Left;Left Sidelying to Sit Rolling Left: 3: Mod assist Left Sidelying to Sit: 1: +2 Total assist;With rails;HOB elevated Left Sidelying to Sit: Patient Percentage: 50% Details for Bed Mobility Assistance: requires constant cues for sequencing, assistance required to advance R LE to EOB.  Transfers Transfers: Sit to Stand;Stand to Sit;Stand Pivot Transfers Sit to Stand: 1: +2 Total assist;From bed;With upper extremity assist Sit to Stand: Patient Percentage: 60% Stand to Sit: 1: +2 Total assist;To bed;To chair/3-in-1 Stand to Sit: Patient Percentage: 50% Stand Pivot Transfers: 1: +2 Total assist Stand Pivot Transfers: Patient Percentage: 50% Details for Transfer Assistance: performed sit to stand  x2, pt performed 50% of overall transfer bed to chair. Pt required manual assistance to maintain NWB status on R LE, required constant VC's for sequencing. used 2 arm assist for SPT.   Ambulation/Gait Ambulation/Gait Assistance: Not tested (comment) Stairs: No    Exercises     PT Diagnosis:    PT Problem List:   PT Treatment Interventions:     PT Goals Acute Rehab PT Goals PT Goal Formulation: With patient Time For Goal Achievement: 07/31/12 Potential to Achieve Goals: Good PT Goal: Supine/Side to Sit - Progress: Progressing toward goal PT Transfer Goal: Bed to Chair/Chair to Bed - Progress: Progressing toward goal PT Goal: Perform Home Exercise Program - Progress: Progressing toward goal  Visit Information  Last PT Received On: 07/20/12 Assistance Needed: +2    Subjective Data  Subjective: I feel bloated. Need to go to the bathroom.    Cognition  Cognition Overall Cognitive Status: Appears within functional limits for tasks assessed/performed Arousal/Alertness: Awake/alert Orientation Level: Appears intact for tasks assessed Behavior During Session: [facility] for tasks performed Memory Deficits: pt able to recall WB status with min cueing during session.    Balance  Balance Balance Assessed: Yes Static Standing Balance Static Standing - Balance Support: Left upper extremity supported Static Standing - Level of Assistance: 3: Mod assist  End of Session PT - End of Session Equipment Utilized During Treatment: Gait belt Activity Tolerance: Patient limited by fatigue Patient left: in chair;with call bell/phone within reach;with family/visitor present Nurse Communication: Mobility status   GP     Donell Sievert, [place] [phone] 07/20/2012, 2:59 PM

## 2012-07-20 NOTE — Progress Notes (Signed)
Speech Language Pathology Dysphagia Treatment Patient Details Name: Roger Palmer MRN: [id number] DOB: [birth year] Today's Date: 07/20/2012 Time: 4098-1191 SLP Time Calculation (min): 7 min  Assessment / Plan / Recommendation Clinical Impression  Pt with initial dysphagia s/p brief intubation - today with resolved symptoms with improved airway protection with thin liquids.  Lung sounds are clear; pt afebrile.  Wife reports improved swallow function over course of w/e.  SLP to sign off.    Diet Recommendation  Continue with Current Diet: Thin liquid    SLP Plan All goals met      Swallowing Goals  SLP Swallowing Goals Patient will consume recommended diet without observed clinical signs of aspiration with: Independent assistance Swallow Study Goal #1 - Progress: Met Patient will utilize recommended strategies during swallow to increase swallowing safety with: Independent assistance Swallow Study Goal #2 - Progress: Met  General Temperature Spikes Noted: No Respiratory Status: Room air Behavior/Cognition: Alert;Cooperative;Pleasant mood Oral Cavity - Dentition: Adequate natural dentition Patient Positioning: Upright in bed  Oral Cavity - Oral Hygiene     Dysphagia Treatment Treatment focused on: Skilled observation of diet tolerance Treatment Methods/Modalities: Skilled observation Patient observed directly with PO's: Yes Type of PO's observed: Thin liquids Feeding: Able to feed self Liquids provided via: Straw Amount of cueing:  (none)  Amanda L. Samson Frederic, [place] CCC/SLP Pager [phone]     Carolan Shiver 07/20/2012, 11:07 AM

## 2012-07-20 NOTE — Progress Notes (Signed)
Patient interviewed and examined, agree with PA note above.  Mariella Saa MD, FACS  07/20/2012 9:16 AM

## 2012-07-20 NOTE — Consult Note (Signed)
Physical Medicine and Rehabilitation Consult  Reason for Consult: Polytrauma with RUE/RLE fractures Referring Physician:  Dr. Lindie Spruce.   HPI: ICHAEL Palmer is a 62 y.o. male with history of DM, DDD, who sustained a fall of about 15 ft when he fell out of a tree on 07/11/2012. Pt was using a chainsaw, cutting branches from a tree when he fell off a ladder, landing on his R side. Pt had immediate onset of pain in R arm and R hip along with the inability to bear weight. Work up in ED revealed open right elbow dislocation, right distal radius fracture, displaced right iliac fracture extending to superior and medial acetabular walls and non displaced  Right inferior pubic rami fracture.  Pt was taken to the OR for I&D with ORIF of his R elbow along with a R radial head replacement and ORIF of the R wrist. While in the OR Dr. Shon Baton placed a skeletal traction pin to help prevent further displacement and protrusion of the femoral head into the the R acetabulum. He is NWB RUE.   Dr. Carola Frost was consulted for input and patient underwent ORIF right acetabulum on 03/04 and is TDWB on RLE  Was extubated without difficulty on 03/06. Post op has had problems with ileus and urinary retention. CT abdomen with  Mild BPH and non obstructing stones left kidney.   Dr. Briant Cedar consulted as patient developed on acute renal failure. He felt that patient with ATN from contrast as well as  Hypotension in face of ACE and ARB. Medications adjusted with recommendations for close monitoring of I/O and IVF for hydration. Currently NPO and has had issues with N/V. On IV zosyn for possible PNA.  Therapies ongoing and patient noted to have delay in processing as well as difficulty maintaining WB status. MD, PT, recommending CIR.   Review of Systems  HENT: Negative for hearing loss.   Eyes: Negative for blurred vision and double vision.  Respiratory: Negative for cough and shortness of breath.   Cardiovascular: Negative for chest pain  and palpitations.  Gastrointestinal: Positive for nausea and abdominal pain.  Musculoskeletal: Positive for myalgias and joint pain.  Neurological: Positive for sensory change (chronic numbness right 1st,2nd and 3rd fingers.). Negative for headaches.  Psychiatric/Behavioral: Positive for memory loss (Wife reports problems with memory and confusion since admission.).   Past Medical History  Diagnosis Date  . Bee sting allergy   . Hypercholesteremia   . Nephrolithiasis   . Herniated disc     c7-c7, c5-6, c3-4, c4-5  . Hypertension   . Back pain   . Obesity   . BPH (benign prostatic hypertrophy)   . Gastritis   . Reflux   . Diabetes mellitus without complication    Past Surgical History  Procedure Laterality Date  . Inguinal hernia repair      right  . Kidney stones      x2 Dr. Earlene Plater   . Orif r elbow  07/12/2012  . Orif r wirst  07/12/2012  . Open reduction internal fixation (orif) distal radial fracture Right 07/11/2012    Procedure: OPEN REDUCTION INTERNAL FIXATION (ORIF) DISTAL RADIAL FRACTURE;  Surgeon: Marlowe Shores, MD;  Location: [facility] OR;  Service: Orthopedics;  Laterality: Right;  . Orif radial fracture Right 07/11/2012    Procedure: OPEN REDUCTION INTERNAL FIXATION (ORIF) RADIAL head FRACTURE;  Surgeon: Marlowe Shores, MD;  Location: [facility] OR;  Service: Orthopedics;  Laterality: Right;   Family History  Problem Relation Age of  Onset  . Heart attack Mother   . Diabetes Mother     NIDDM  . Hypertension Mother   . Hypertension Father   . Stroke Father   . Nephrolithiasis Brother     Social History:  Married.  Works as an Art gallery manager for [employer]. Wife at home and can provide supervision. He reports that he has never smoked. He does not have any smokeless tobacco history on file. He reports that  drinks alcohol on occasion.  He reports that he does not use illicit drugs.  Allergies: No Known Allergies  Medications Prior to Admission  Medication Sig Dispense  Refill  . atorvastatin (LIPITOR) 40 MG tablet Take 40 mg by mouth daily.      Marland Kitchen dutasteride (AVODART) 0.5 MG capsule Take 0.5 mg by mouth daily.        . fenofibrate micronized (LOFIBRA) 134 MG capsule Take 134 mg by mouth daily before breakfast.      . metFORMIN (GLUCOPHAGE) 500 MG tablet Take 500 mg by mouth daily.       Marland Kitchen omeprazole (PRILOSEC) 20 MG capsule Take 20 mg by mouth daily.        . quinapril (ACCUPRIL) 40 MG tablet Take 40 mg by mouth at bedtime.        . Tamsulosin HCl (FLOMAX) 0.4 MG CAPS Take 0.4 mg by mouth daily.       . valsartan-hydrochlorothiazide (DIOVAN-HCT) 160-25 MG per tablet Take 1 tablet by mouth daily.          Home: Home Living Lives With: Spouse Available Help at Discharge: Family (daughter close by) Type of Home: House Home Access: Stairs to enter;Other (comment) (friends buiding a ramp) Home Layout: One level Bathroom Shower/Tub: Tub/shower unit;Tub only;Walk-in shower Bathroom Toilet: Handicapped height Bathroom Accessibility:  (pt thinks w/c accessible) Home Adaptive Equipment: Bedside commode/3-in-1 (may have w/c in the family)  Functional History: Prior Function Able to Take Stairs?: Yes Driving: Yes Vocation: Full time employment Functional Status:  Mobility: Bed Mobility Bed Mobility: Rolling Left;Left Sidelying to Sit Rolling Left: 2: Max assist Left Sidelying to Sit: 1: +2 Total assist Left Sidelying to Sit: Patient Percentage: 30% Transfers Transfers: Sit to Stand;Stand to Sit;Stand Pivot Transfers Sit to Stand: 1: +2 Total assist;From bed;With upper extremity assist Sit to Stand: Patient Percentage: 50% Stand to Sit: 1: +2 Total assist;To bed;To chair/3-in-1 Stand to Sit: Patient Percentage: 50% Stand Pivot Transfers: 1: +2 Total assist Stand Pivot Transfers: Patient Percentage: 50% Ambulation/Gait Ambulation/Gait Assistance: Not tested (comment) Stairs: No Wheelchair Mobility Wheelchair Mobility: No  ADL:     Cognition: Cognition Arousal/Alertness: Awake/alert Orientation Level: Oriented X4 Cognition Overall Cognitive Status: Impaired Area of Impairment: Problem solving;Memory Arousal/Alertness: Awake/alert Orientation Level: Time;Disoriented to Behavior During Session: Flat affect Memory Deficits: pt cannot recall WB status and has difficulty explaining injury Problem Solving: slow to process and follow motor cues, also with minimal conversation Cognition - Other Comments: flat affect and offers very little information, answers questions slowly  Blood pressure 116/52, pulse 80, temperature 98.7 F (37.1 C), temperature source Oral, resp. rate 18, height 5\' 9"  (1.753 m), weight 102.059 kg (225 lb), SpO2 98.00%. Physical Exam  Nursing note and vitals reviewed. Constitutional: He is oriented to person, place, and time. He appears well-developed and well-nourished.  HENT:  Head: Normocephalic and atraumatic.  Eyes: Pupils are equal, round, and reactive to light.  Neck: Normal range of motion.  Cardiovascular: Normal rate and regular rhythm.   Pulmonary/Chest: Effort  normal and breath sounds normal.  Abdominal: He exhibits distension. There is no tenderness. There is no rebound.  Musculoskeletal: He exhibits edema (1+ right hand and RLE. ).  Moves LUE without difficulty. LLE limited by pain radiating to right hip. Right elbow/hand in splint  Neurological: He is alert and oriented to person, place, and time.  Slow to process. Speech clear. Follows basic commands without difficulty. No apparent weakness other than caused by pain inhibition. Sensory exam intact. cogntiively appropriate.  Skin: Skin is warm and dry.  Psychiatric: He has a normal mood and affect. His behavior is normal. Judgment and thought content normal.    Results for orders placed during the hospital encounter of 07/11/12 (from the past 24 hour(s))  GLUCOSE, CAPILLARY     Status: Abnormal   Collection Time    07/19/12  11:21 AM      Result Value Range   Glucose-Capillary 204 (*) 70 - 99 mg/dL  GLUCOSE, CAPILLARY     Status: Abnormal   Collection Time    07/19/12  4:29 PM      Result Value Range   Glucose-Capillary 185 (*) 70 - 99 mg/dL  GLUCOSE, CAPILLARY     Status: Abnormal   Collection Time    07/19/12 10:33 PM      Result Value Range   Glucose-Capillary 193 (*) 70 - 99 mg/dL  GLUCOSE, CAPILLARY     Status: Abnormal   Collection Time    07/20/12  7:21 AM      Result Value Range   Glucose-Capillary 182 (*) 70 - 99 mg/dL  BASIC METABOLIC PANEL     Status: Abnormal   Collection Time    07/20/12  7:45 AM      Result Value Range   Sodium 147 (*) 135 - 145 mEq/L   Potassium 4.2  3.5 - 5.1 mEq/L   Chloride 115 (*) 96 - 112 mEq/L   CO2 18 (*) 19 - 32 mEq/L   Glucose, Bld 230 (*) 70 - 99 mg/dL   BUN 58 (*) 6 - 23 mg/dL   Creatinine, Ser 1.61 (*) 0.50 - 1.35 mg/dL   Calcium 8.4  8.4 - 09.6 mg/dL   GFR calc non Af Amer 13 (*) >90 mL/min   GFR calc Af Amer 15 (*) >90 mL/min   No results found.  Assessment/Plan: Diagnosis: right distal radius, multiple pelvic fx's, right elbow dislocation 1. Does the need for close, 24 hr/day medical supervision in concert with the patient's rehab needs make it unreasonable for this patient to be served in a less intensive setting? Yes 2. Co-Morbidities requiring supervision/potential complications: abla, hypernatremia, pain mgt 3. Due to bladder management, bowel management, safety, skin/wound care, disease management, medication administration, pain management and patient education, does the patient require 24 hr/day rehab nursing? Yes 4. Does the patient require coordinated care of a physician, rehab nurse, PT (1-2 hrs/day, 5 days/week) and OT (1-2 hrs/day, 5 days/week) to address physical and functional deficits in the context of the above medical diagnosis(es)? Yes Addressing deficits in the following areas: balance, endurance, locomotion, strength,  transferring, bowel/bladder control, bathing, dressing, feeding, grooming, toileting and psychosocial support 5. Can the patient actively participate in an intensive therapy program of at least 3 hrs of therapy per day at least 5 days per week? Yes 6. The potential for patient to make measurable gains while on inpatient rehab is excellent 7. Anticipated functional outcomes upon discharge from inpatient rehab are min assist to supervision with  PT, min assist with OT, n/a with SLP. 8. Estimated rehab length of stay to reach the above functional goals is: 10-12 days 9. Does the patient have adequate social supports to accommodate these discharge functional goals? Yes 10. Anticipated D/C setting: Home 11. Anticipated post D/C treatments: HH therapy 12. Overall Rehab/Functional Prognosis: excellent  RECOMMENDATIONS: This patient's condition is appropriate for continued rehabilitative care in the following setting: CIR Patient has agreed to participate in recommended program. Yes Note that insurance prior authorization may be required for reimbursement for recommended care.  Comment:Rehab RN to follow up.   Ranelle Oyster, MD, [place] [facility]     07/20/2012

## 2012-07-20 NOTE — Progress Notes (Signed)
ANTICOAGULATION CONSULT NOTE - Initial Consult  Pharmacy Consult for Coumadin Indication: VTE prophylaxis  No Known Allergies  Patient Measurements: Height: 5\' 9"  (175.3 cm) Weight: 225 lb (102.059 kg) IBW/kg (Calculated) : 70.7  Vital Signs: Temp: 98.7 F (37.1 C) (03/10 0651) BP: 116/52 mmHg (03/10 0651) Pulse Rate: 80 (03/10 0651)  Labs:  Recent Labs  07/18/12 0650 07/19/12 0645 07/20/12 0745  HGB 8.3*  --   --   HCT 23.7*  --   --   PLT 230  --   --   CREATININE 4.66* 4.56* 4.44*    Estimated Creatinine Clearance: 20.3 ml/min (by C-G formula based on Cr of 4.44).   Medical History: Past Medical History  Diagnosis Date  . Bee sting allergy   . Hypercholesteremia   . Nephrolithiasis   . Herniated disc     c7-c7, c5-6, c3-4, c4-5  . Hypertension   . Back pain   . Obesity   . BPH (benign prostatic hypertrophy)   . Gastritis   . Reflux   . Diabetes mellitus without complication    Assessment: 61yom to begin coumadin for VTE prophylaxis s/p ORIF(3/4) for R acetabular fracture. Ortho planning coumadin for 8 weeks. Baseline INR 1.2, coumadin score = 6.   Goal of Therapy:  INR 2-3 Monitor platelets by anticoagulation protocol: Yes   Plan:  1) Coumadin 7.5mg  x 1 2) Daily INR 3) Coumadin education - book/video  Fredrik Rigger 07/20/2012,9:53 AM

## 2012-07-20 NOTE — Progress Notes (Signed)
Patient ID: Roger Palmer, male   DOB: 06-Oct-1950, 62 y.o.   MRN: [id number]   LOS: 9 days   Subjective: Nausea + emesis this morning. No real flatus. Had pain that kept him up a large part of the night but didn't want to take pain meds because of effect on ileus and possible nausea.  Objective: Vital signs in last 24 hours: Temp:  [97.9 F (36.6 C)-98.9 F (37.2 C)] 98.7 F (37.1 C) (03/10 0651) Pulse Rate:  [75-80] 80 (03/10 0651) Resp:  [18-20] 18 (03/10 0651) BP: (111-124)/(52-62) 116/52 mmHg (03/10 0651) SpO2:  [98 %] 98 % (03/10 0651) Last BM Date: 07/19/12   Laboratory  BMET  Recent Labs  07/19/12 0645 07/20/12 0745  NA 149* 147*  K 3.4* 4.2  CL 115* 115*  CO2 19 18*  GLUCOSE 195* 230*  BUN 57* 58*  CREATININE 4.56* 4.44*  CALCIUM 8.3* 8.4   CBG (last 3)   Recent Labs  07/19/12 1629 07/19/12 2233 07/20/12 0721  GLUCAP 185* 193* 182*    Physical Exam General appearance: alert and no distress Resp: clear to auscultation bilaterally Cardio: regular rate and rhythm GI: Soft, NT, mod distension Extremities: NVI   Assessment/Plan: Fall Open right elbow dislocation/R distal radius fx s/p ORIF   R acetabular fx/R iliac wing fx s/p ORIF  R inf pubic ramus fx  ID - Zosyn empiric for aspiration D#6, afebrile. Culture negative. Will check CBC but plan to d/c Zosyn after tomorrow AKI - renal following, function improving slowly  ABL anemia - Check  VTE - SCD's, Lovenox, ortho wants coumadin, will start today FEN - Will try some tramadol for pain, d/c foley but maintain strict I&O's Dispo - PT/OT recommending CIR, will consult    Freeman Caldron, PA-C Pager: [phone] [facility] Pager: [phone]   07/20/2012

## 2012-07-20 NOTE — Progress Notes (Signed)
Orthopaedic Trauma Service Progress Note  Subjective    Doing better    R hip feels good    abd feeling better    R arm feels ok as well  Objective  BP 116/52  Pulse 80  Temp(Src) 98.7 F (37.1 C) (Oral)  Resp 18  Ht 5\' 9"  (1.753 m)  Wt 102.059 kg (225 lb)  BMI 33.21 kg/m2  SpO2 98%  Patient Vitals for the past 24 hrs:  BP Temp Temp src Pulse Resp SpO2  07/20/12 0651 116/52 mmHg 98.7 F (37.1 C) - 80 18 98 %  07/19/12 2231 124/62 mmHg 98.9 F (37.2 C) - 75 18 98 %  07/19/12 1438 111/58 mmHg 97.9 F (36.6 C) Oral 76 20 98 %   Intake/Output     03/09 0701 - 03/10 0700 03/10 0701 - 03/11 0700   P.O. 1360    I.V. (mL/kg) 800 (7.8)    Total Intake(mL/kg) 2160 (21.2)    Urine (mL/kg/hr) 1400 (0.6)    Total Output 1400     Net +760          Stool Occurrence 1 x     Exam  Gen: awake and alert Abd: softer, NT, + BS Ext      Right Lower Extremity             DPN, SPN, TN, LFCN sensation intact             EHL, FHL, AT, PT, peroneals, gastroc motor intact             Ext warm             + DP pulse             Swelling stable  Wounds stable  Assessment and Plan  62 y/o male s/p fall   1. Fall 2. R anterior column/anterior wall acetabulum fx: OTA 62-A3             POD 6                         TDWB R leg                         No formal hip precautions                         PT/OT                          Dressing changes as needed   Overhead frame with trapeze                                         3. R distal humerus fx and R distal radius fx             Per Dr. Mina Marble 4. ABL anemia             Stable at current time             Monitor                5. DVT/PE prophylaxis        d/c lovenox        Coumadin x 8 weeks due to R acetabulum repair and immobility  6. FEN             per TS 7. Renal failure  Per renal              7. Dispo           therapies             Ortho issues currently stable              Mearl Latin,  PA-C [facility] [phone] (P) 07/20/2012 9:30 AM

## 2012-07-21 LAB — CULTURE, BLOOD (ROUTINE X 2): Culture: NO GROWTH

## 2012-07-21 LAB — CBC
HCT: 23.9 % — ABNORMAL LOW (ref 39.0–52.0)
Hemoglobin: 8.1 g/dL — ABNORMAL LOW (ref 13.0–17.0)
MCHC: 33.9 g/dL (ref 30.0–36.0)
WBC: 15.9 10*3/uL — ABNORMAL HIGH (ref 4.0–10.5)

## 2012-07-21 LAB — PROTIME-INR
INR: 1.4 (ref 0.00–1.49)
Prothrombin Time: 16.8 seconds — ABNORMAL HIGH (ref 11.6–15.2)

## 2012-07-21 LAB — GLUCOSE, CAPILLARY
Glucose-Capillary: 219 mg/dL — ABNORMAL HIGH (ref 70–99)
Glucose-Capillary: 200 mg/dL — ABNORMAL HIGH (ref 70–99)
Glucose-Capillary: 193 mg/dL — ABNORMAL HIGH (ref 70–99)

## 2012-07-21 LAB — BASIC METABOLIC PANEL
CO2: 19 mEq/L (ref 19–32)
Chloride: 116 mEq/L — ABNORMAL HIGH (ref 96–112)
Chloride: 114 mEq/L — ABNORMAL HIGH (ref 96–112)
GFR calc Af Amer: 15 mL/min — ABNORMAL LOW (ref >90)
GFR calc Af Amer: 15 mL/min — ABNORMAL LOW (ref >90)
GFR calc non Af Amer: 13 mL/min — ABNORMAL LOW (ref >90)
Potassium: 3.8 mEq/L (ref 3.5–5.1)
Potassium: 3.9 mEq/L (ref 3.5–5.1)
Sodium: 145 mEq/L (ref 135–145)
Sodium: 144 mEq/L (ref 135–145)

## 2012-07-21 MED ORDER — DOCUSATE SODIUM 100 MG PO CAPS: 100.0000 mg | ORAL_CAPSULE | Freq: Two times a day (BID) | ORAL | Status: DC | PRN

## 2012-07-21 MED ORDER — PSYLLIUM 95 % PO PACK
1.0000 | PACK | Freq: Every day | ORAL | Status: DC
  Filled 2012-07-21 (×6): qty 1

## 2012-07-21 MED ORDER — WARFARIN SODIUM 7.5 MG PO TABS
7.5000 mg | ORAL_TABLET | Freq: Once | ORAL | Status: AC
  Administered 2012-07-21: 7.5 mg via ORAL
  Filled 2012-07-21 (×2): qty 1

## 2012-07-21 NOTE — Progress Notes (Signed)
Orthopaedic Trauma Service Progress Note  Subjective  Doing well No new complaints + flatus BM yesterday  Pain controlled R hip/pelvis Complains more about splint R arm  Objective  BP 136/68  Pulse 83  Temp(Src) 97.8 F (36.6 C) (Oral)  Resp 18  Ht 5\' 9"  (1.753 m)  Wt 102.059 kg (225 lb)  BMI 33.21 kg/m2  SpO2 96%  Patient Vitals for the past 24 hrs:  BP Temp Pulse Resp SpO2  07/21/12 0622 136/68 mmHg 97.8 F (36.6 C) 83 18 96 %  07/20/12 2211 130/53 mmHg 97.7 F (36.5 C) 65 18 100 %  07/20/12 1356 120/58 mmHg 98.2 F (36.8 C) 76 18 96 %    Intake/Output     03/10 0701 - 03/11 0700 03/11 0701 - 03/12 0700   P.O. 360    I.V. (mL/kg) 3897.5 (38.2)    IV Piggyback 400    Total Intake(mL/kg) 4657.5 (45.6)    Urine (mL/kg/hr) 601 (0.2)    Stool 2 (0)    Total Output 603     Net +4054.5          Urine Occurrence 3 x     Exam  Gen: awake and alert Abd: softer, NT, + BS Ext      Right Lower Extremity             DPN, SPN, TN, LFCN sensation intact             EHL, FHL, AT, PT, peroneals, gastroc motor intact             Ext warm             + DP pulse             Swelling stable             Wounds stable   Assessment and Plan  62 y/o male s/p fall   1. Fall 2. R anterior column/anterior wall acetabulum fx: OTA 62-A3             POD 7                         TDWB R leg                         No formal hip precautions                         PT/OT                           Dressing changes as needed                         Overhead frame with trapeze   TED hose Right leg,thigh high                                         3. R distal humerus fx and R distal radius fx             Per Dr. Mina Marble  Will contact again re: splint  4. ABL anemia             Stable at current time  Monitor                5. DVT/PE prophylaxis           Coumadin x 8 weeks due to R acetabulum repair and immobility             6. FEN             per  TS 7. Renal failure             Per renal           8. Dispo           therapies             Ortho issues currently stable  Accepted to CIR, just awaiting Bed  Mearl Latin, PA-C [facility] [phone] (P)

## 2012-07-21 NOTE — Progress Notes (Signed)
ANTICOAGULATION CONSULT NOTE - Initial Consult  Pharmacy Consult for Coumadin Indication: VTE prophylaxis  No Known Allergies  Patient Measurements: Height: 5\' 9"  (175.3 cm) Weight: 225 lb (102.059 kg) IBW/kg (Calculated) : 70.7  Vital Signs: Temp: 97.8 F (36.6 C) (03/11 0622) BP: 136/68 mmHg (03/11 0622) Pulse Rate: 83 (03/11 0622)  Labs:  Recent Labs  07/19/12 0645 07/20/12 0745 07/20/12 1120 07/21/12 0654  HGB  --   --  8.4*  --   HCT  --   --  24.2*  --   PLT  --   --  269  --   LABPROT  --   --  15.6* 16.8*  INR  --   --  1.27 1.40  CREATININE 4.56* 4.44*  --  4.50*    Estimated Creatinine Clearance: 20.1 ml/min (by C-G formula based on Cr of 4.5).  Assessment: 61yom started on coumadin for VTE prophylaxis s/p ORIF(3/4) for R acetabular fracture. Ortho planning coumadin for 8 weeks. INR remains subtherapeutic as expected after first dose. No CBC today. No bleeding reported.  Goal of Therapy:  INR 2-3 Monitor platelets by anticoagulation protocol: Yes   Plan:  1) Repeat coumadin 7.5mg  x 1 2) Follow up INR in AM   Fredrik Rigger 07/21/2012,8:12 AM

## 2012-07-21 NOTE — Progress Notes (Signed)
I await OT eval to begin approval with insurance for a possible IP rehab admission. I have notified therapy department of need. I met with pt and his wife at bedside and they are in agreement. [phone]

## 2012-07-21 NOTE — Progress Notes (Signed)
Ileus resolving well.  Tolerated lunch.  CIR eval ongoing. CRT plateaued today. He C/O sensation of shifting/swelling RUE splint - will ask Dr. Mina Marble to check. Patient examined and I agree with the assessment and plan  Violeta Gelinas, MD, MPH, FACS Pager: [phone]  07/21/2012 12:39 PM

## 2012-07-21 NOTE — Progress Notes (Signed)
[facility] - PROGRESS NOTE Resident Note   Please see below for attending addendum to resident note.  Subjective:   No acute events overnight  Objective:    Vital Signs:   Temp:  [97.7 F (36.5 C)-98.2 F (36.8 C)] 97.8 F (36.6 C) (03/11 0622) Pulse Rate:  [65-83] 83 (03/11 0622) Resp:  [18] 18 (03/11 0622) BP: (120-136)/(53-68) 136/68 mmHg (03/11 0622) SpO2:  [96 %-100 %] 96 % (03/11 0622) Last BM Date: 07/20/12  24-hour weight change: Weight change:   Weight trends: Filed Weights   07/12/12 0427  Weight: 225 lb (102.059 kg)    Intake/Output:  03/10 0701 - 03/11 0700 In: 4657.5 [P.O.:360; I.V.:3897.5; IV Piggyback:400] Out: 603 [Urine:601; Stool:2]  Physical Exam: General: Well-developed, well-nourished, in no acute distress, lying in bed Head: Normocephalic, atraumatic. Eyes: PERRLA, EOMI, No signs of anemia or jaundice. Lungs: Normal respiratory effort. Clear to auscultation in anterior fields bilaterally from apices to bases without crackles or wheezes appreciated. Heart: normal rate, regular rhythm, normal S1 and S2, no gallop, murmur, or rubs appreciated. Abdomen: hypoactive. Obese, taught, non-tender.  GU: foley in place, clear drainage Extremities: 1-2+ pretibial edema, distal pulses intact Neurologic: grossly non-focal, alert and oriented x3, appropriate and cooperative throughout examination.   Labs: Basic Metabolic Panel:  Recent Labs Lab 07/14/12 1911 07/15/12 0124  07/18/12 0650 07/18/12 1120 07/19/12 0645 07/20/12 0745 07/21/12 0654  NA 137 137  < > 147*  --  149* 147* 145  K 3.8 3.7  < > 3.1*  --  3.4* 4.2 3.8  CL  --  102  < > 114*  --  115* 115* 116*  CO2  --  26  < > 20  --  19 18* 19  GLUCOSE 195* 230*  < > 202*  --  195* 230* 267*  BUN  --  17  < > 56*  --  57* 58* 56*  CREATININE  --  1.10  < > 4.66*  --  4.56* 4.44* 4.50*  CALCIUM  --  7.8*  < > 8.7  --  8.3* 8.4 8.1*  MG  --  1.9  --   --  2.9*  --   --   --    PHOS  --   --   --   --   --  2.1*  --   --   < > = values in this interval not displayed.  Liver Function Tests:  Recent Labs Lab 07/15/12 0124 07/19/12 0645  AST 18  --   ALT 10  --   ALKPHOS 31*  --   BILITOT 0.6  --   PROT 5.2*  --   ALBUMIN 2.7* 2.3*    CBC:  Recent Labs Lab 07/16/12 0305  07/17/12 0345 07/18/12 0650 07/20/12 1120  WBC 10.4  < > 12.9* 13.8* 16.6*  NEUTROABS 7.2  --   --  11.0* 13.1*  HGB 8.5*  < > 8.0* 8.3* 8.4*  HCT 24.1*  < > 22.9* 23.7* 24.2*  MCV 85.2  < > 86.1 86.5 87.1  PLT 157  < > 192 230 269  < > = values in this interval not displayed.   CBG:  Recent Labs Lab 07/20/12 0721 07/20/12 1138 07/20/12 1619 07/20/12 2213 07/21/12 0726  GLUCAP 182* 170* 158* 187* 219*    Microbiology: Results for orders placed during the hospital encounter of 07/11/12  MRSA PCR SCREENING     Status: None   Collection  Time    07/12/12 12:52 AM      Result Value Range Status   MRSA by PCR NEGATIVE  NEGATIVE Final   Comment:            The GeneXpert MRSA Assay (FDA     approved for NASAL specimens     only), is one component of a     comprehensive MRSA colonization     surveillance program. It is not     intended to diagnose MRSA     infection nor to guide or     monitor treatment for     MRSA infections.  CULTURE, BLOOD (ROUTINE X 2)     Status: None   Collection Time    07/15/12  1:20 AM      Result Value Range Status   Specimen Description BLOOD LEFT WRIST   Final   Special Requests     Final   Value: BOTTLES DRAWN AEROBIC AND ANAEROBIC 10CC AEROBIC 7CC ANAEROBIC   Culture  Setup Time 07/15/2012 08:23   Final   Culture     Final   Value:        BLOOD CULTURE RECEIVED NO GROWTH TO DATE CULTURE WILL BE HELD FOR 5 DAYS BEFORE ISSUING A FINAL NEGATIVE REPORT   Report Status PENDING   Incomplete  CULTURE, BLOOD (ROUTINE X 2)     Status: None   Collection Time    07/15/12  1:30 AM      Result Value Range Status   Specimen Description  BLOOD LEFT HAND   Final   Special Requests BOTTLES DRAWN AEROBIC ONLY 7CC   Final   Culture  Setup Time 07/15/2012 08:23   Final   Culture     Final   Value:        BLOOD CULTURE RECEIVED NO GROWTH TO DATE CULTURE WILL BE HELD FOR 5 DAYS BEFORE ISSUING A FINAL NEGATIVE REPORT   Report Status PENDING   Incomplete  CULTURE, RESPIRATORY (NON-EXPECTORATED)     Status: None   Collection Time    07/15/12  3:35 AM      Result Value Range Status   Specimen Description TRACHEAL ASPIRATE   Final   Special Requests Normal   Final   Gram Stain     Final   Value: MODERATE WBC PRESENT,BOTH PMN AND MONONUCLEAR     NO SQUAMOUS EPITHELIAL CELLS SEEN     NO ORGANISMS SEEN   Culture MODERATE YEAST CONSISTENT WITH CANDIDA SPECIES   Final   Report Status 07/17/2012 FINAL   Final     Urinalysis:    Component Value Date/Time   COLORURINE YELLOW 07/15/2012 0131   APPEARANCEUR CLEAR 07/15/2012 0131   LABSPEC 1.020 07/15/2012 0131   PHURINE 6.0 07/15/2012 0131   GLUCOSEU >1000* 07/15/2012 0131   HGBUR SMALL* 07/15/2012 0131   BILIRUBINUR NEGATIVE 07/15/2012 0131   KETONESUR NEGATIVE 07/15/2012 0131   PROTEINUR NEGATIVE 07/15/2012 0131   UROBILINOGEN 0.2 07/15/2012 0131   NITRITE NEGATIVE 07/15/2012 0131   LEUKOCYTESUR NEGATIVE 07/15/2012 0131     Imaging: Dg Chest 2 View  07/21/2012  *RADIOLOGY REPORT*  Clinical Data: Pneumonia.  CHEST - 2 VIEW  Comparison: 07/17/2012  Findings: The bibasilar patchy infiltrates have almost cleared. There is minimal residual haziness medially at both bases.  Heart size and vascularity are normal.  No osseous abnormality.  IMPRESSION: Minimal residual infiltrate at both bases medially.   Original Report Authenticated By: Francene Boyers, M.D.  Medications:    Infusions: . dextrose 5 % and 0.45% NaCl 150 mL/hr at 07/20/12 1159    Scheduled Medications: . coumadin book   Does not apply Once  . enoxaparin (LOVENOX) injection  40 mg Subcutaneous Q24H  . insulin aspart  0-15 Units  Subcutaneous TID WC  . insulin aspart  0-5 Units Subcutaneous QHS  . metoCLOPramide (REGLAN) injection  10 mg Intravenous Q6H  . pantoprazole  40 mg Oral Daily   Or  . pantoprazole (PROTONIX) IV  40 mg Intravenous Daily  . piperacillin-tazobactam (ZOSYN)  IV  2.25 g Intravenous Q8H  . potassium chloride  40 mEq Oral BID  . psyllium  1 packet Oral Daily  . tamsulosin  0.4 mg Oral Daily  . warfarin  7.5 mg Oral ONCE-1800  . warfarin   Does not apply Once  . Warfarin - Pharmacist Dosing Inpatient   Does not apply q1800    PRN Medications: docusate sodium, ondansetron (ZOFRAN) IV, ondansetron, promethazine, traMADol   Assessment/ Plan:   Patient is a 62 y.o. male with a PMHx of hypertension, DM Type 2, GERD, BPH, and nephrolithiasis who was admitted to [facility] on 07/11/2012 for multiple orthopedic injuries (hip, elbow) secondary to fall from 15 foot ladder.   1. Acute Renal Failure, nonoliguric: secondary to ATN from contrast and hypotension in the setting of ACE and ARB, creatinine levels elevated but relatively stable ~4.5 from 4.44 yesterday, metabolic acidosis resolved, electrolytes stable - cont to hold ACEi, ARB and HCTZ  - defer further contrast administration  - UOP ~ 1400cc yesterday and additional 600cc thus far today, pt net positive outpt  - cont Cr monitoring   Recent Labs Lab 07/17/12 0345 07/18/12 0650 07/19/12 0645 07/20/12 0745 07/21/12 0654  CREATININE 4.15* 4.66* 4.56* 4.44* 4.50*   2. Hypokalemia: resolved with supplementation  Recent Labs Lab 07/17/12 0345 07/18/12 0650 07/19/12 0645 07/20/12 0745 07/21/12 0654  K 3.2* 3.1* 3.4* 4.2 3.8   3. Hypernatremia: resolved   Recent Labs Lab 07/17/12 0345 07/18/12 0650 07/19/12 0645 07/20/12 0745 07/21/12 0654  NA 142 147* 149* 147* 145    4. Metabolic acidosis, mild: resolved   5. Nephroliathiasis: CT of Abd/Pelvis demonstrating 2mm nonobstructive renal calculus to left upper pole, 3mm calculus to  left lower pole, no hydronephrosis. Pt is w/o current sympoms of colicky abdominal or dysuria. UA with hematuria and hyaline casts.  -cont to monitor  -consider Urology evaluation   4. Benign Prostatic Hypertrophy: prostate with mild enlargement and dystrophic calcifications  -cont tamsulosin per primary team   5. Right Acetabular and right iliac fracture s/p closed fixation: managed per surgery, CT pelvis results pending  6. Right elbow and radial head displacement s/p ORIF: managed per Surgery   7. Hypertension: normotensive, off of bp agents ACEi, ARB, and HCTZ   8. DM Type 2: mildly elevated cbgs, HgbA1c 6.5 - cont management per primary team  CBG (last 3)   Recent Labs  07/20/12 1619 07/20/12 2213 07/21/12 0726  GLUCAP 158* 187* 219*     Length of Stay: 10 days  Patient history and plan of care reviewed with attending, Dr. Allena Katz.   Manuela Schwartz, MD  PGYII, Internal Medicine Resident 07/21/2012, 8:30 AM  I have personally seen and examined this patient and agree with the assessment/plan as outlined above by Bosie Clos MD (PGY2). UOP remains fixed with what appears to be ATN plateau phase. Hypernatremia improved on hypotonic fluids. No acute HD needs identified. PATEL,JAY K.,MD  07/21/2012 10:50 AM

## 2012-07-21 NOTE — Progress Notes (Signed)
Inpatient Diabetes Program Recommendations  AACE/ADA: New Consensus Statement on Inpatient Glycemic Control (2013)  Target Ranges:  Prepandial:   less than 140 mg/dL      Peak postprandial:   less than 180 mg/dL (1-2 hours)      Critically ill patients:  140 - 180 mg/dL  Results for JB, DULWORTH (MRN [id number]) as of 07/21/2012 11:01  Ref. Range 07/20/2012 07:21 07/20/2012 11:38 07/20/2012 16:19 07/20/2012 22:13 07/21/2012 07:26  Glucose-Capillary Latest Range: 70-99 mg/dL 086 (H) 578 (H) 469 (H) 187 (H) 219 (H)   Inpatient Diabetes Program Recommendations Insulin - Basal: Please consider adding low dose Lantus 10 units QHS while patient in hospital. Thank you  Piedad Climes BSN, RN,CDE Inpatient Diabetes Coordinator [phone] (team pager)

## 2012-07-21 NOTE — Progress Notes (Signed)
LOS: 10 days   Subjective: Pt feeling pretty good today.  He denies much pain, but some discomfort under the right arm splint, "feels like its shifting or swollen".  Pt eating well, urinating well without catheter.  Pt passing a lot of flatus today and a large BM.  Mobilizing with PT/OT, pending rehab.  IS at 2000.  Objective: Vital signs in last 24 hours: Temp:  [97.7 F (36.5 C)-98.2 F (36.8 C)] 97.8 F (36.6 C) (03/11 0622) Pulse Rate:  [65-83] 83 (03/11 0622) Resp:  [18] 18 (03/11 0622) BP: (120-136)/(53-68) 136/68 mmHg (03/11 0622) SpO2:  [96 %-100 %] 96 % (03/11 0622) Last BM Date: 07/20/12  Lab Results:  CBC  Recent Labs  07/20/12 1120  WBC 16.6*  HGB 8.4*  HCT 24.2*  PLT 269   BMET  Recent Labs  07/19/12 0645 07/20/12 0745  NA 149* 147*  K 3.4* 4.2  CL 115* 115*  CO2 19 18*  GLUCOSE 195* 230*  BUN 57* 58*  CREATININE 4.56* 4.44*  CALCIUM 8.3* 8.4    Imaging: No results found.   PE: General appearance: alert, cooperative and no distress Resp: clear to auscultation bilaterally Cardio: regular rate and rhythm, S1, S2 normal, no murmur, click, rub or gallop GI: soft, non-tender; bowel sounds normal; no masses,  no organomegaly Extremities: Right arm in splint, left hip dressings intact, distal extremities CSM intact Pulses: 2+ and symmetric   Assessment/Plan: Fall  Open right elbow dislocation/R distal radius fx s/p ORIF  R acetabular fx/R iliac wing fx s/p ORIF  R inf pubic ramus fx  Ileus - seems to be resolving, ordered some stool softeners, CT shows no abscess, will start soft diet ID - Zosyn empiric for aspiration D#7, afebrile. Culture negative. Will check CBC but plan to d/c Zosyn after today's doses.  CXR shows minimal residual infiltrate. AKI - renal following, function improving slowly, but bumped to 4.5 today from 4.44 yesterday ABL anemia - stable, but check cbc today Leukocytosis - down today slightly (15.9), recheck tomorrow VTE -  SCD's, Lovenox, started on coumadin per ortho FEN - Cont tramadol for pain, cont I&O's  Dispo - PT/OT recommending CIR, pending disp, would like renals input on when discharge could occur    Candiss Norse Pager: [phone] General Trauma PA Pager: [phone]   07/21/2012

## 2012-07-22 LAB — CBC
HCT: 23.8 % — ABNORMAL LOW (ref 39.0–52.0)
MCH: 30.7 pg (ref 26.0–34.0)
MCV: 89.1 fL (ref 78.0–100.0)
Platelets: 253 10*3/uL (ref 150–400)
RBC: 2.67 MIL/uL — ABNORMAL LOW (ref 4.22–5.81)
RDW: 15.1 % (ref 11.5–15.5)
WBC: 16.3 10*3/uL — ABNORMAL HIGH (ref 4.0–10.5)

## 2012-07-22 LAB — BASIC METABOLIC PANEL
BUN: 52 mg/dL — ABNORMAL HIGH (ref 6–23)
CO2: 17 mEq/L — ABNORMAL LOW (ref 19–32)
Calcium: 8.2 mg/dL — ABNORMAL LOW (ref 8.4–10.5)
Chloride: 116 mEq/L — ABNORMAL HIGH (ref 96–112)
Creatinine, Ser: 4.4 mg/dL — ABNORMAL HIGH (ref 0.50–1.35)

## 2012-07-22 LAB — GLUCOSE, CAPILLARY: Glucose-Capillary: 198 mg/dL — ABNORMAL HIGH (ref 70–99)

## 2012-07-22 MED ORDER — SODIUM BICARBONATE 8.4 % IV SOLN
INTRAVENOUS | Status: DC
  Administered 2012-07-22 – 2012-07-23 (×2): via INTRAVENOUS
  Filled 2012-07-22 (×6): qty 75

## 2012-07-22 MED ORDER — INSULIN GLARGINE 100 UNIT/ML ~~LOC~~ SOLN
10.0000 [IU] | Freq: Every day | SUBCUTANEOUS | Status: DC
  Administered 2012-07-22 – 2012-07-23 (×2): 10 [IU] via SUBCUTANEOUS

## 2012-07-22 MED ORDER — WARFARIN SODIUM 2.5 MG PO TABS
2.5000 mg | ORAL_TABLET | Freq: Once | ORAL | Status: AC
  Administered 2012-07-22: 2.5 mg via ORAL
  Filled 2012-07-22: qty 1

## 2012-07-22 NOTE — Progress Notes (Signed)
[facility] - PROGRESS NOTE Resident Note   Please see below for attending addendum to resident note.  Subjective:   No acute events overnight  Objective:    Vital Signs:   Temp:  [97.5 F (36.4 C)-98.3 F (36.8 C)] 98.3 F (36.8 C) (03/12 0635) Pulse Rate:  [75-85] 76 (03/12 0635) Resp:  [18] 18 (03/12 0635) BP: (118-127)/(55-65) 119/55 mmHg (03/12 0635) SpO2:  [97 %-98 %] 98 % (03/12 0635) Last BM Date: 07/21/12  24-hour weight change: Weight change:   Weight trends: Filed Weights   07/12/12 0427  Weight: 225 lb (102.059 kg)    Intake/Output:  03/11 0701 - 03/12 0700 In: 4260 [P.O.:660; I.V.:3600] Out: 1201 [Urine:1200; Stool:1]  Physical Exam: General: Well-developed, well-nourished, in no acute distress, lying in bed Head: Normocephalic, atraumatic. Eyes: PERRLA, EOMI, No signs of anemia or jaundice. Lungs: Normal respiratory effort. Clear to auscultation in anterior fields bilaterally from apices to bases without crackles or wheezes appreciated. Heart: normal rate, regular rhythm, normal S1 and S2, no gallop, murmur, or rubs appreciated. Abdomen: hypoactive. Obese, taught, non-tender.  GU: foley in place, clear drainage Extremities: 1-2+ pretibial edema, distal pulses intact Neurologic: grossly non-focal, alert and oriented x3, appropriate and cooperative throughout examination.   Labs: Basic Metabolic Panel:  Recent Labs Lab 07/18/12 0650 07/18/12 1120 07/19/12 0645  07/21/12 0654 07/21/12 0804 07/22/12 0545  NA 147*  --  149*  < > 145 144 145  K 3.1*  --  3.4*  < > 3.8 3.9 3.9  CL 114*  --  115*  < > 116* 114* 116*  CO2 20  --  19  < > 19 17* 17*  GLUCOSE 202*  --  195*  < > 267* 265* 220*  BUN 56*  --  57*  < > 56* 54* 52*  CREATININE 4.66*  --  4.56*  < > 4.50* 4.35* 4.40*  CALCIUM 8.7  --  8.3*  < > 8.1* 8.2* 8.2*  MG  --  2.9*  --   --   --   --   --   PHOS  --   --  2.1*  --   --   --   --   < > = values in this interval  not displayed.  Liver Function Tests:  Recent Labs Lab 07/19/12 0645  ALBUMIN 2.3*    CBC:  Recent Labs Lab 07/16/12 0305  07/18/12 0650 07/20/12 1120 07/21/12 0804 07/22/12 0545  WBC 10.4  < > 13.8* 16.6* 15.9* 16.3*  NEUTROABS 7.2  --  11.0* 13.1*  --   --   HGB 8.5*  < > 8.3* 8.4* 8.1* 8.2*  HCT 24.1*  < > 23.7* 24.2* 23.9* 23.8*  MCV 85.2  < > 86.5 87.1 91.2 89.1  PLT 157  < > 230 269 240 253  < > = values in this interval not displayed.   CBG:  Recent Labs Lab 07/21/12 0726 07/21/12 1112 07/21/12 1634 07/21/12 2139 07/22/12 0657  GLUCAP 219* 200* 205* 193* 195*    Microbiology: Results for orders placed during the hospital encounter of 07/11/12  MRSA PCR SCREENING     Status: None   Collection Time    07/12/12 12:52 AM      Result Value Range Status   MRSA by PCR NEGATIVE  NEGATIVE Final   Comment:            The GeneXpert MRSA Assay (FDA     approved  for NASAL specimens     only), is one component of a     comprehensive MRSA colonization     surveillance program. It is not     intended to diagnose MRSA     infection nor to guide or     monitor treatment for     MRSA infections.  CULTURE, BLOOD (ROUTINE X 2)     Status: None   Collection Time    07/15/12  1:20 AM      Result Value Range Status   Specimen Description BLOOD LEFT WRIST   Final   Special Requests     Final   Value: BOTTLES DRAWN AEROBIC AND ANAEROBIC 10CC AEROBIC 7CC ANAEROBIC   Culture  Setup Time 07/15/2012 08:23   Final   Culture NO GROWTH 5 DAYS   Final   Report Status 07/21/2012 FINAL   Final  CULTURE, BLOOD (ROUTINE X 2)     Status: None   Collection Time    07/15/12  1:30 AM      Result Value Range Status   Specimen Description BLOOD LEFT HAND   Final   Special Requests BOTTLES DRAWN AEROBIC ONLY [facility]   Final   Culture  Setup Time 07/15/2012 08:23   Final   Culture NO GROWTH 5 DAYS   Final   Report Status 07/21/2012 FINAL   Final  CULTURE, RESPIRATORY  (NON-EXPECTORATED)     Status: None   Collection Time    07/15/12  3:35 AM      Result Value Range Status   Specimen Description TRACHEAL ASPIRATE   Final   Special Requests Normal   Final   Gram Stain     Final   Value: MODERATE WBC PRESENT,BOTH PMN AND MONONUCLEAR     NO SQUAMOUS EPITHELIAL CELLS SEEN     NO ORGANISMS SEEN   Culture MODERATE YEAST CONSISTENT WITH CANDIDA SPECIES   Final   Report Status 07/17/2012 FINAL   Final     Urinalysis:    Component Value Date/Time   COLORURINE YELLOW 07/15/2012 0131   APPEARANCEUR CLEAR 07/15/2012 0131   LABSPEC 1.020 07/15/2012 0131   PHURINE 6.0 07/15/2012 0131   GLUCOSEU >1000* 07/15/2012 0131   HGBUR SMALL* 07/15/2012 0131   BILIRUBINUR NEGATIVE 07/15/2012 0131   KETONESUR NEGATIVE 07/15/2012 0131   PROTEINUR NEGATIVE 07/15/2012 0131   UROBILINOGEN 0.2 07/15/2012 0131   NITRITE NEGATIVE 07/15/2012 0131   LEUKOCYTESUR NEGATIVE 07/15/2012 0131     Imaging: Dg Chest 2 View  07/21/2012  *RADIOLOGY REPORT*  Clinical Data: Pneumonia.  CHEST - 2 VIEW  Comparison: 07/17/2012  Findings: The bibasilar patchy infiltrates have almost cleared. There is minimal residual haziness medially at both bases.  Heart size and vascularity are normal.  No osseous abnormality.  IMPRESSION: Minimal residual infiltrate at both bases medially.   Original Report Authenticated By: Francene Boyers, M.D.    Ct Pelvis Wo Contrast  07/21/2012  *RADIOLOGY REPORT*  Clinical Data: History of trauma with pelvic fractures.  Status post fracture fixation.  Ileus.  CT PELVIS WITHOUT CONTRAST  Technique:  Multidetector CT imaging of the pelvis was performed following the standard protocol without intravenous contrast.  Comparison: CT chest, abdomen and pelvis 07/17/2012.  Findings: There is mild dilatation of loops of both small and large bowel consistent with ileus.  The abdomen is not imaged on this examination making full evaluation of the bowel impossible. Scattered subcutaneous edema is  present.  There is some air in the  subcutaneous tissues about the right pelvis from surgery for fracture fixation.  There is no fluid collection within the pelvis. The prostate gland is mildly prominent with calcifications noted.  Osseous structures demonstrate plate and screw fixation of right pelvic fractures.  Screws localizing a right iliac wing fracture which is now in anatomic position and alignment.  There is also marked improvement in position and alignment of a complex right acetabular fracture with malleable plate and screws in place. Right inferior pubic ramus fracture is also again identified.  No new bony abnormality is seen.  Hardware is intact.  IMPRESSION:  1.  Mild dilatation of visualized small and large bowel compatible with postoperative ileus. 2.  Marked improvement in position and alignment of right pelvic fractures with fixation hardware in place.  Hardware is intact and no acute abnormality is identified.   Original Report Authenticated By: Holley Dexter, M.D.       Medications:    Infusions: . dextrose 5 % and 0.45% NaCl 150 mL/hr at 07/20/12 1159    Scheduled Medications: . coumadin book   Does not apply Once  . enoxaparin (LOVENOX) injection  40 mg Subcutaneous Q24H  . insulin aspart  0-15 Units Subcutaneous TID WC  . insulin aspart  0-5 Units Subcutaneous QHS  . insulin glargine  10 Units Subcutaneous Daily  . metoCLOPramide (REGLAN) injection  10 mg Intravenous Q6H  . pantoprazole  40 mg Oral Daily   Or  . pantoprazole (PROTONIX) IV  40 mg Intravenous Daily  . piperacillin-tazobactam (ZOSYN)  IV  2.25 g Intravenous Q8H  . potassium chloride  40 mEq Oral BID  . psyllium  1 packet Oral Daily  . tamsulosin  0.4 mg Oral Daily  . warfarin   Does not apply Once  . Warfarin - Pharmacist Dosing Inpatient   Does not apply q1800    PRN Medications: docusate sodium, ondansetron (ZOFRAN) IV, ondansetron, promethazine, traMADol   Assessment/ Plan:   Patient is a 62  y.o. male with a PMHx of hypertension, DM Type 2, GERD, BPH, and nephrolithiasis who was admitted to [facility] on 07/11/2012 for multiple orthopedic injuries (hip, elbow) secondary to fall from 15 foot ladder.   1. Acute Renal Failure, nonoliguric: secondary to ATN from contrast and hypotension in the setting of ACE and ARB, creatinine levels plateaud at 4.3-4.5 yesterday, metabolic acidosis resolved, electrolytes stable - cont to hold ACEi, ARB and HCTZ  - defer further contrast administration  - UOP ~ 1.2L cc yesterday per EPIc - cont Cr monitoring   Recent Labs Lab 07/19/12 0645 07/20/12 0745 07/21/12 0654 07/21/12 0804 07/22/12 0545  CREATININE 4.56* 4.44* 4.50* 4.35* 4.40*   2. Hypokalemia: resolved with supplementation  Recent Labs Lab 07/19/12 0645 07/20/12 0745 07/21/12 0654 07/21/12 0804 07/22/12 0545  K 3.4* 4.2 3.8 3.9 3.9   3. Hypernatremia: resolved   Recent Labs Lab 07/19/12 0645 07/20/12 0745 07/21/12 0654 07/21/12 0804 07/22/12 0545  NA 149* 147* 145 144 145    4. Metabolic acidosis, mild: resolved   5. Nephroliathiasis: CT of Abd/Pelvis demonstrating 2mm nonobstructive renal calculus to left upper pole, 3mm calculus to left lower pole, no hydronephrosis. Pt is w/o current sympoms of colicky abdominal or dysuria. UA with hematuria and hyaline casts.  -cont to monitor  -consider Urology evaluation   4. Benign Prostatic Hypertrophy: prostate with mild enlargement and dystrophic calcifications  -cont tamsulosin per primary team   5. Right Acetabular and right iliac fracture s/p closed fixation: managed per  surgery, CT pelvis results pending  6. Right elbow and radial head displacement s/p ORIF: managed per Surgery   7. Hypertension: normotensive, off of bp agents ACEi, ARB, and HCTZ   8. DM Type 2: mildly elevated cbgs, HgbA1c 6.5 - cont management per primary team  CBG (last 3)   Recent Labs  07/21/12 1634 07/21/12 2139 07/22/12 0657  GLUCAP  205* 193* 195*     Length of Stay: 11 days  Patient history and plan of care reviewed with attending, Dr. Allena Katz.   Manuela Schwartz, MD  PGYII, Internal Medicine Resident 07/22/2012, 8:56 AM  I have personally seen and examined this patient and agree with the assessment/plan as outlined above by Bosie Clos MD (PGY2). Will switch to hypotonic bicarbonate today- renal function unchanged and UOP remains acceptable. Recovery appears slow but still expected. Okay from renal standpoint to transfer to CIR (we can follow him there) PATEL,JAY K.,MD 07/22/2012 10:50 AM

## 2012-07-22 NOTE — Progress Notes (Signed)
LOS: 11 days   Subjective: Pt feels pretty good today, pain still in right arm.  No other complaints.  Eating a soft diet well, +flatus and BM, and urinating well.  Pt up with PT and OT to see today. Pt currently in the chair.    Objective: Vital signs in last 24 hours: Temp:  [97.5 F (36.4 C)-98.3 F (36.8 C)] 98.3 F (36.8 C) (03/12 0635) Pulse Rate:  [75-85] 76 (03/12 0635) Resp:  [18] 18 (03/12 0635) BP: (118-127)/(55-65) 119/55 mmHg (03/12 0635) SpO2:  [97 %-98 %] 98 % (03/12 0635) Last BM Date: 07/21/12  Lab Results:  CBC  Recent Labs  07/21/12 0804 07/22/12 0545  WBC 15.9* 16.3*  HGB 8.1* 8.2*  HCT 23.9* 23.8*  PLT 240 253   BMET  Recent Labs  07/21/12 0654 07/21/12 0804  NA 145 144  K 3.8 3.9  CL 116* 114*  CO2 19 17*  GLUCOSE 267* 265*  BUN 56* 54*  CREATININE 4.50* 4.35*  CALCIUM 8.1* 8.2*    Imaging: Dg Chest 2 View  07/21/2012  *RADIOLOGY REPORT*  Clinical Data: Pneumonia.  CHEST - 2 VIEW  Comparison: 07/17/2012  Findings: The bibasilar patchy infiltrates have almost cleared. There is minimal residual haziness medially at both bases.  Heart size and vascularity are normal.  No osseous abnormality.  IMPRESSION: Minimal residual infiltrate at both bases medially.   Original Report Authenticated By: Francene Boyers, M.D.    Ct Pelvis Wo Contrast  07/21/2012  *RADIOLOGY REPORT*  Clinical Data: History of trauma with pelvic fractures.  Status post fracture fixation.  Ileus.  CT PELVIS WITHOUT CONTRAST  Technique:  Multidetector CT imaging of the pelvis was performed following the standard protocol without intravenous contrast.  Comparison: CT chest, abdomen and pelvis 07/17/2012.  Findings: There is mild dilatation of loops of both small and large bowel consistent with ileus.  The abdomen is not imaged on this examination making full evaluation of the bowel impossible. Scattered subcutaneous edema is present.  There is some air in the subcutaneous tissues  about the right pelvis from surgery for fracture fixation.  There is no fluid collection within the pelvis. The prostate gland is mildly prominent with calcifications noted.  Osseous structures demonstrate plate and screw fixation of right pelvic fractures.  Screws localizing a right iliac wing fracture which is now in anatomic position and alignment.  There is also marked improvement in position and alignment of a complex right acetabular fracture with malleable plate and screws in place. Right inferior pubic ramus fracture is also again identified.  No new bony abnormality is seen.  Hardware is intact.  IMPRESSION:  1.  Mild dilatation of visualized small and large bowel compatible with postoperative ileus. 2.  Marked improvement in position and alignment of right pelvic fractures with fixation hardware in place.  Hardware is intact and no acute abnormality is identified.   Original Report Authenticated By: Holley Dexter, M.D.      PE: General appearance: alert, cooperative and no distress  Resp: clear to auscultation bilaterally, IS at 2500 Cardio: regular rate and rhythm, S1, S2 normal, no murmur, click, rub or gallop  GI: soft, non-tender; bowel sounds normal; no masses, no organomegaly  Extremities: Right arm in splint, right hip dressings intact, distal extremities CSM intact  Pulses: 2+ and symmetric   Assessment/Plan: Fall  Open right elbow dislocation/R distal radius fx s/p ORIF  R acetabular fx/R iliac wing fx s/p ORIF - POD #8 R inf  pubic ramus fx  Ileus - seems to be resolving, ordered some stool softeners, CT shows no abscess, will start soft diet  ID - Completed 7 day course of Zosyn, afebrile, but WBC elevated may consider completing an extra day. Culture negative. CXR shows minimal residual infiltrate.  AKI - renal following, function improving slowly ABL anemia - stable, but check cbc today  Leukocytosis - up to 16.3 today, recheck tomorrow  Elevated CBG's- ranging  187-219, will start Lantus 10 units daily and increase to 15 tomorrow if not improved VTE - SCD's, Lovenox, started on coumadin per ortho for 8 weeks FEN - Cont tramadol for pain, cont I&O's  Dispo - PT recommending CIR, pending OT disp, would like renals input on when discharge could occur    Candiss Norse Pager: [phone] General Trauma PA Pager: [phone]   07/22/2012

## 2012-07-22 NOTE — Progress Notes (Signed)
Occupational Therapy Note   07/22/12 1300  OT Visit Information  Last OT Received On 07/22/12  Assistance Needed +2  OT Time Calculation  OT Start Time 1100  OT Stop Time 1241  OT Time Calculation (min) 101 min  Precautions  Precautions Fall  Restrictions  Weight Bearing Restrictions Yes  RUE Weight Bearing NWB  RLE Weight Bearing NWB  Other Position/Activity Restrictions Gentle PROM Rt. elbow and wrist per Montez Morita, PA order 07/22/12  ADL  ADL Comments OT ordered for fabrication of posterior elbow splint extending to ulnar aspect of hand.  Post op cast/splint removed.  Rt. UE already wrapped with curlex, as pt reports MD changed dressing this morning.  Posterior elbow splint fabricated with forearm and wrist in neutral.  Pt tolerated procedure well.  He and wife were instructed in basic wear and care of splint as well as purpose of splint.  RN instructed on wear schedule and cleanind of splint.  Will plan to check splint in ~2 hours to ensure it is fitting well.  Cognition  Overall Cognitive Status Appears within functional limits for tasks assessed/performed  Arousal/Alertness Awake/alert  Orientation Level Appears intact for tasks assessed  Behavior During Session [facility] for tasks performed  OT Assessment/Plan  Comments on Treatment Session Pt tolerated splint fabrication.  Splint appears to be fitting well - will check in ~ 2hous for proper fit.  Pt and wife instructed on basic splint wear and care. RN has been instructed in care and wear schedule  OT Plan Discharge plan remains appropriate  OT Frequency Min 2X/week  Follow Up Recommendations CIR  OT Equipment 3 in 1 bedside comode  Acute Rehab OT Goals  OT Goal Formulation With patient  Time For Goal Achievement 07/22/12  Potential to Achieve Goals Good  ADL Goals  Additional ADL Goal #1 Pt and wife will be independent with splint wear and care  ADL Goal: Additional Goal #1 - Progress Goal set today  OT General Charges  $OT  Visit 1 Procedure  OT Treatments  $Self Care/Home Management  8-22 mins   Payten Beaumier. OTR/L  [phone]

## 2012-07-22 NOTE — Progress Notes (Signed)
ANTICOAGULATION CONSULT NOTE -  Pharmacy Consult for Coumadin and Zosyn Indication: VTE prophylaxis, pneumonia  No Known Allergies  Patient Measurements: Height: 5\' 9"  (175.3 cm) Weight: 225 lb (102.059 kg) IBW/kg (Calculated) : 70.7  Vital Signs: Temp: 98.3 F (36.8 C) (03/12 0635) Temp src: Oral (03/12 0635) BP: 119/55 mmHg (03/12 0635) Pulse Rate: 76 (03/12 0635)  Labs:  Recent Labs  07/20/12 1120 07/21/12 0654 07/21/12 0804 07/22/12 0545  HGB 8.4*  --  8.1* 8.2*  HCT 24.2*  --  23.9* 23.8*  PLT 269  --  240 253  LABPROT 15.6* 16.8*  --  23.8*  INR 1.27 1.40  --  2.24*  CREATININE  --  4.50* 4.35* 4.40*    Estimated Creatinine Clearance: 20.5 ml/min (by C-G formula based on Cr of 4.4).  Assessment: 61yom started on coumadin for VTE prophylaxis s/p ORIF(3/4) for R acetabular fracture. Ortho planning coumadin for 8 weeks. INR therapeutic. No bleeding reported.  Goal of Therapy:  INR 2-3 Monitor platelets by anticoagulation protocol: Yes   Plan:  1) Coumadin 2.5mg  x 1 dose today 2) Follow up INR in AM 3) Same Zosyn dose today 4) Can stop Lovenox as INR therapeutic Mickeal Skinner 07/22/2012,12:56 PM

## 2012-07-22 NOTE — Evaluation (Signed)
Occupational Therapy Evaluation Patient Details Name: ZEKIEL TORIAN MRN: [id number] DOB: Apr 03, 1951 Today's Date: 07/22/2012 Time: 1027-2536 OT Time Calculation (min): 42 min  OT Assessment / Plan / Recommendation Clinical Impression  This 62 yo male s/p fall off a ladder while cutting a tree with resultant right acetabular fx and right elbow fx presents to acute OT with problems below. Will benefit from acute OT with follow up OT on CIR.    OT Assessment  Patient needs continued OT Services    Follow Up Recommendations  CIR    Barriers to Discharge None    Equipment Recommendations  3 in 1 bedside comode       Frequency  Min 2X/week    Precautions / Restrictions Precautions Precautions: Fall Restrictions Weight Bearing Restrictions: Yes RUE Weight Bearing: Non weight bearing RLE Weight Bearing: Non weight bearing   Pertinent Vitals/Pain DOE, he says this is new since the accident    ADL  Eating/Feeding: Simulated;Set up Where Assessed - Eating/Feeding: Bed level Grooming: Simulated;Set up Where Assessed - Grooming: Supine, head of bed up Upper Body Bathing: Minimal assistance;Simulated Where Assessed - Upper Body Bathing: Supported sitting Lower Body Bathing: Simulated;+1 Total assistance Where Assessed - Lower Body Bathing: Supine, head of bed up;Supine, head of bed flat;Rolling right and/or left;Supported sit to stand Upper Body Dressing: Simulated;+1 Total assistance Where Assessed - Upper Body Dressing: Supported sitting Lower Body Dressing: Simulated;+1 Total assistance Where Assessed - Lower Body Dressing: Supine, head of bed up;Supine, head of bed flat;Rolling right and/or left;Supported sit to stand Toilet Transfer: Performed;+2 Total assistance Toilet Transfer: Patient Percentage: 50% Toilet Transfer Method: Surveyor, minerals: Materials engineer and Hygiene: Performed;+1 Total assistance (with +2 initally  sit to stand and pt pulling up bed rail) Where Assessed - Toileting Clothing Manipulation and Hygiene: Standing Transfers/Ambulation Related to ADLs: total A +2 (pt=50%--when he tries to push up with LUE; 60% when he pulls up with LUE); unable to ambulate at this time due to NWB'ing RLE and RUE ADL Comments: Pt initally with posterior lean in sitting at EOB    OT Diagnosis: Generalized weakness;Acute pain  OT Problem List: Decreased strength;Decreased range of motion;Impaired balance (sitting and/or standing);Decreased knowledge of use of DME or AE;Pain;Impaired UE functional use OT Treatment Interventions: Self-care/ADL training;Therapeutic exercise;DME and/or AE instruction;Patient/family education;Therapeutic activities;Balance training   OT Goals Acute Rehab OT Goals OT Goal Formulation: With patient Time For Goal Achievement: 07/22/12 Potential to Achieve Goals: Good ADL Goals Pt Will Transfer to Toilet: with mod assist;Squat pivot transfer;Stand pivot transfer;Maintaining weight bearing status (going to his left) ADL Goal: Toilet Transfer - Progress: Goal set today Miscellaneous OT Goals Miscellaneous OT Goal #1: Pt will be able to raise RLE off of ground as a precursor to using AE for LBB/D. OT Goal: Miscellaneous Goal #1 - Progress: Goal set today Miscellaneous OT Goal #2: Pt will be Mod A to get to EOB with HOB no greater than 30 degrees and use of rail. OT Goal: Miscellaneous Goal #2 - Progress: Goal set today  Visit Information  Last OT Received On: 07/22/12 Assistance Needed: +2 PT/OT Co-Evaluation/Treatment: Yes    Subjective Data  Subjective: I'll do whatever I need to do   Prior Functioning     Home Living Lives With: Spouse Available Help at Discharge: Family Type of Home: House Home Access: Stairs to enter;Other (comment) Home Layout: One level Bathroom Shower/Tub: Tub/shower unit;Tub only;Walk-in shower Bathroom Toilet: Handicapped height Home  Adaptive  Equipment: Bedside commode/3-in-1 Prior Function Level of Independence: Independent Able to Take Stairs?: Yes Driving: Yes Vocation: Full time employment Communication Communication: No difficulties Dominant Hand: Left         Vision/Perception Vision - History Baseline Vision: No visual deficits   Cognition  Cognition Overall Cognitive Status: Appears within functional limits for tasks assessed/performed Arousal/Alertness: Awake/alert Orientation Level: Appears intact for tasks assessed Behavior During Session: [facility] for tasks performed Memory Deficits: Pt aware he cannot put weight on RLE or RUE (it is just hard not to on RLE)    Extremity/Trunk Assessment Right Upper Extremity Assessment RUE ROM/Strength/Tone: Deficits RUE ROM/Strength/Tone Deficits: Due to arm cast/splint mid upper arm to hand Left Upper Extremity Assessment LUE ROM/Strength/Tone: Within functional levels     Mobility Bed Mobility Bed Mobility: Supine to Sit;Sit to Supine Supine to Sit: 1: +2 Total assist;HOB flat;With rails Supine to Sit: Patient Percentage: 50% Sit to Supine: 1: +2 Total assist;HOB flat;With rail Sit to Supine: Patient Percentage: 50% Details for Bed Mobility Assistance: A for trunk support and positioning and cues/ A for R LE in and out of bed. Cues for sequency throughout Transfers Sit to Stand: 1: +2 Total assist;From bed;With upper extremity assist Sit to Stand: Patient Percentage: 60% Stand to Sit: 1: +2 Total assist;To bed;To chair/3-in-1 Stand to Sit: Patient Percentage: 50% Details for Transfer Assistance: performed sit to stand x3, pt performed 50% of overall transfer bed to chair. Pt required manual assistance to maintain NWB status on R LE, required constant VC's for sequencing. used 2 arm assist for SPT.  Patient also able to stand slightly better when using L arm to pull up from rail vs. pushing off.            End of Session OT - End of Session Activity Tolerance:  Patient tolerated treatment well Patient left: in bed;with family/visitor present Nurse Communication:  (had a bowel movement)       Evette Georges [phone] 07/22/2012, 12:19 PM

## 2012-07-22 NOTE — Progress Notes (Signed)
Non-oliguric renal failure seems to be improving slowly.  Possible CIR soon.  Otherwise stable.  This patient has been seen and I agree with the findings and treatment plan.  Marta Lamas. Gae Bon, MD, FACS [phone] (pager) [phone] (direct pager) Trauma Surgeon

## 2012-07-22 NOTE — Progress Notes (Signed)
Physical Therapy Treatment Patient Details Name: Roger Palmer MRN: [id number] DOB: 04/22/1951 Today's Date: 07/22/2012 Time: 1191-4782 PT Time Calculation (min): 40 min  PT Assessment / Plan / Recommendation Comments on Treatment Session  Patient with R femur fx and R elbow fx. Patient able to stand x3 and SPT x2. Patient very motivated to progress with transfer. A required to maintain NWB on R leg. Awaiting CIR approval at this time    Follow Up Recommendations  CIR     Does the patient have the potential to tolerate intense rehabilitation     Barriers to Discharge        Equipment Recommendations  Wheelchair (measurements PT);Wheelchair cushion (measurements PT)    Recommendations for Other Services    Frequency Min 3X/week   Plan Discharge plan remains appropriate;Frequency remains appropriate    Precautions / Restrictions Precautions Precautions: Fall Restrictions RUE Weight Bearing: Non weight bearing RLE Weight Bearing: Non weight bearing   Pertinent Vitals/Pain no apparent distress     Mobility  Bed Mobility Bed Mobility: Supine to Sit;Sit to Supine Supine to Sit: 1: +2 Total assist;HOB flat;With rails Supine to Sit: Patient Percentage: 50% Sit to Supine: 1: +2 Total assist;HOB flat;With rail Sit to Supine: Patient Percentage: 50% Details for Bed Mobility Assistance: A for trunk support and positioning and cues/ A for R LE in and out of bed. Cues for sequency throughout Transfers Sit to Stand: 1: +2 Total assist;From bed;With upper extremity assist Sit to Stand: Patient Percentage: 60% Stand to Sit: 1: +2 Total assist;To bed;To chair/3-in-1 Stand to Sit: Patient Percentage: 50% Stand Pivot Transfers: 1: +2 Total assist Stand Pivot Transfers: Patient Percentage: 50% Details for Transfer Assistance: performed sit to stand x3, pt performed 50% of overall transfer bed to chair. Pt required manual assistance to maintain NWB status on R LE, required constant VC's  for sequencing. used 2 arm assist for SPT.  Patient also able to stand slightly better when using L arm to pull up from rail vs. pushing off.  Ambulation/Gait Ambulation/Gait Assistance: Not tested (comment)    Exercises     PT Diagnosis:    PT Problem List:   PT Treatment Interventions:     PT Goals Acute Rehab PT Goals PT Goal: Supine/Side to Sit - Progress: Progressing toward goal PT Goal: Sit to Supine/Side - Progress: Progressing toward goal PT Transfer Goal: Bed to Chair/Chair to Bed - Progress: Progressing toward goal  Visit Information  Last PT Received On: 07/22/12 Assistance Needed: +2    Subjective Data      Cognition  Cognition Overall Cognitive Status: Appears within functional limits for tasks assessed/performed Arousal/Alertness: Awake/alert Orientation Level: Appears intact for tasks assessed Behavior During Session: [facility] for tasks performed    Balance     End of Session PT - End of Session Equipment Utilized During Treatment: Gait belt Activity Tolerance: Patient tolerated treatment well Patient left: with call bell/phone within reach;with family/visitor present;in bed Nurse Communication: Mobility status   GP     Fredrich Birks 07/22/2012, 12:01 PM 07/22/2012 Fredrich Birks PTA [phone] pager [phone] office

## 2012-07-22 NOTE — Progress Notes (Signed)
Occupational Therapy Note   07/22/12 1400  OT Visit Information  Last OT Received On 07/22/12  Assistance Needed +2  OT Time Calculation  OT Start Time 1441  OT Stop Time 1448  OT Time Calculation (min) 7 min  Precautions  Precautions Fall  Restrictions  Weight Bearing Restrictions Yes  RUE Weight Bearing NWB  RLE Weight Bearing NWB  Other Position/Activity Restrictions Gentle PROM Rt. elbow and wrist per Montez Morita, PA order 07/22/12  ADL  ADL Comments Splint checked. No signs of redness, pain, or pressure.  Pt. reports it is fitting well, and denies pain. He was able to assist with donning splint  Cognition  Overall Cognitive Status Appears within functional limits for tasks assessed/performed  Arousal/Alertness Awake/alert  Orientation Level Appears intact for tasks assessed  Behavior During Session [facility] for tasks performed  OT Assessment/Plan  Comments on Treatment Session Pt tolerating splint with no issues, or signs of pain, or pressure.  OT Plan Discharge plan remains appropriate  OT Frequency Min 2X/week  Follow Up Recommendations CIR  OT Equipment 3 in 1 bedside comode  ADL Goals  ADL Goal: Additional Goal #1 - Progress Progressing toward goals   Jeani Hawking, OTR/L  [phone]

## 2012-07-23 ENCOUNTER — Inpatient Hospital Stay (HOSPITAL_COMMUNITY): Payer: 59

## 2012-07-23 ENCOUNTER — Inpatient Hospital Stay (HOSPITAL_COMMUNITY)
Admission: RE | Admit: 2012-07-23 | Discharge: 2012-08-06 | DRG: 949 | Disposition: A | Discharge status: Rehabilitation Facility | Payer: 59 | Source: Intra-hospital | Attending: Physical Medicine & Rehabilitation | Admitting: Physical Medicine & Rehabilitation

## 2012-07-23 DIAGNOSIS — S52501A Unspecified fracture of the lower end of right radius, initial encounter for closed fracture: Secondary | ICD-10-CM

## 2012-07-23 DIAGNOSIS — Z79899 Other long term (current) drug therapy: Principal | ICD-10-CM

## 2012-07-23 DIAGNOSIS — W11XXXA Fall on and from ladder, initial encounter: Secondary | ICD-10-CM | POA: Diagnosis present

## 2012-07-23 DIAGNOSIS — S51001A Unspecified open wound of right elbow, initial encounter: Secondary | ICD-10-CM

## 2012-07-23 DIAGNOSIS — K560 Paralytic ileus: Secondary | ICD-10-CM | POA: Diagnosis present

## 2012-07-23 DIAGNOSIS — D62 Acute posthemorrhagic anemia: Secondary | ICD-10-CM | POA: Diagnosis present

## 2012-07-23 DIAGNOSIS — N170 Acute kidney failure with tubular necrosis: Secondary | ICD-10-CM | POA: Diagnosis present

## 2012-07-23 DIAGNOSIS — D72829 Elevated white blood cell count, unspecified: Secondary | ICD-10-CM | POA: Diagnosis present

## 2012-07-23 DIAGNOSIS — S52009A Unspecified fracture of upper end of unspecified ulna, initial encounter for closed fracture: Secondary | ICD-10-CM | POA: Diagnosis present

## 2012-07-23 DIAGNOSIS — Y849 Medical procedure, unspecified as the cause of abnormal reaction of the patient, or of later complication, without mention of misadventure at the time of the procedure: Secondary | ICD-10-CM | POA: Diagnosis present

## 2012-07-23 DIAGNOSIS — S069XAA Unspecified intracranial injury with loss of consciousness status unknown, initial encounter: Secondary | ICD-10-CM

## 2012-07-23 DIAGNOSIS — S32409A Unspecified fracture of unspecified acetabulum, initial encounter for closed fracture: Secondary | ICD-10-CM | POA: Diagnosis present

## 2012-07-23 DIAGNOSIS — S32401A Unspecified fracture of right acetabulum, initial encounter for closed fracture: Secondary | ICD-10-CM

## 2012-07-23 DIAGNOSIS — E1159 Type 2 diabetes mellitus with other circulatory complications: Secondary | ICD-10-CM | POA: Diagnosis present

## 2012-07-23 DIAGNOSIS — S42409A Unspecified fracture of lower end of unspecified humerus, initial encounter for closed fracture: Secondary | ICD-10-CM | POA: Diagnosis present

## 2012-07-23 DIAGNOSIS — E669 Obesity, unspecified: Secondary | ICD-10-CM | POA: Diagnosis present

## 2012-07-23 DIAGNOSIS — E870 Hyperosmolality and hypernatremia: Secondary | ICD-10-CM | POA: Diagnosis present

## 2012-07-23 DIAGNOSIS — W14XXXA Fall from tree, initial encounter: Secondary | ICD-10-CM

## 2012-07-23 DIAGNOSIS — K567 Ileus, unspecified: Secondary | ICD-10-CM | POA: Diagnosis present

## 2012-07-23 DIAGNOSIS — E872 Acidosis, unspecified: Secondary | ICD-10-CM | POA: Diagnosis present

## 2012-07-23 DIAGNOSIS — E119 Type 2 diabetes mellitus without complications: Secondary | ICD-10-CM | POA: Diagnosis present

## 2012-07-23 DIAGNOSIS — K929 Disease of digestive system, unspecified: Secondary | ICD-10-CM | POA: Diagnosis present

## 2012-07-23 DIAGNOSIS — N400 Enlarged prostate without lower urinary tract symptoms: Secondary | ICD-10-CM | POA: Diagnosis present

## 2012-07-23 DIAGNOSIS — N179 Acute kidney failure, unspecified: Secondary | ICD-10-CM | POA: Diagnosis present

## 2012-07-23 DIAGNOSIS — N059 Unspecified nephritic syndrome with unspecified morphologic changes: Secondary | ICD-10-CM | POA: Diagnosis present

## 2012-07-23 DIAGNOSIS — S329XXA Fracture of unspecified parts of lumbosacral spine and pelvis, initial encounter for closed fracture: Secondary | ICD-10-CM | POA: Diagnosis present

## 2012-07-23 LAB — BASIC METABOLIC PANEL
CO2: 18 mEq/L — ABNORMAL LOW (ref 19–32)
Calcium: 8.3 mg/dL — ABNORMAL LOW (ref 8.4–10.5)
Chloride: 114 mEq/L — ABNORMAL HIGH (ref 96–112)
Creatinine, Ser: 4.33 mg/dL — ABNORMAL HIGH (ref 0.50–1.35)
GFR calc Af Amer: 16 mL/min — ABNORMAL LOW (ref >90)
Sodium: 146 mEq/L — ABNORMAL HIGH (ref 135–145)

## 2012-07-23 LAB — CBC
MCH: 30.5 pg (ref 26.0–34.0)
MCV: 89.3 fL (ref 78.0–100.0)
Platelets: 263 10*3/uL (ref 150–400)
RBC: 2.62 MIL/uL — ABNORMAL LOW (ref 4.22–5.81)
RDW: 15.3 % (ref 11.5–15.5)
WBC: 16 10*3/uL — ABNORMAL HIGH (ref 4.0–10.5)

## 2012-07-23 LAB — GLUCOSE, CAPILLARY
Glucose-Capillary: 182 mg/dL — ABNORMAL HIGH (ref 70–99)
Glucose-Capillary: 180 mg/dL — ABNORMAL HIGH (ref 70–99)
Glucose-Capillary: 191 mg/dL — ABNORMAL HIGH (ref 70–99)

## 2012-07-23 LAB — PROTIME-INR: Prothrombin Time: 28.2 seconds — ABNORMAL HIGH (ref 11.6–15.2)

## 2012-07-23 MED ORDER — GUAIFENESIN-DM 100-10 MG/5ML PO SYRP
5.0000 mL | ORAL_SOLUTION | Freq: Four times a day (QID) | ORAL | Status: DC | PRN
  Administered 2012-08-02 (×2): 10 mL via ORAL
  Filled 2012-07-23 (×2): qty 10

## 2012-07-23 MED ORDER — WARFARIN SODIUM 1 MG PO TABS
1.0000 mg | ORAL_TABLET | Freq: Once | ORAL | Status: DC
  Filled 2012-07-23: qty 1

## 2012-07-23 MED ORDER — TAMSULOSIN HCL 0.4 MG PO CAPS
0.4000 mg | ORAL_CAPSULE | Freq: Every day | ORAL | Status: DC
  Administered 2012-07-24 – 2012-08-06 (×14): 0.4 mg via ORAL
  Filled 2012-07-23 (×16): qty 1

## 2012-07-23 MED ORDER — GLIMEPIRIDE 2 MG PO TABS
2.0000 mg | ORAL_TABLET | Freq: Every day | ORAL | Status: DC
  Administered 2012-07-24 – 2012-07-25 (×2): 2 mg via ORAL
  Filled 2012-07-23 (×3): qty 1

## 2012-07-23 MED ORDER — POLYETHYLENE GLYCOL 3350 17 G PO PACK
17.0000 g | PACK | Freq: Every day | ORAL | Status: DC
  Administered 2012-07-24 – 2012-07-31 (×5): 17 g via ORAL
  Filled 2012-07-23 (×14): qty 1

## 2012-07-23 MED ORDER — TRAZODONE HCL 50 MG PO TABS
25.0000 mg | ORAL_TABLET | Freq: Every evening | ORAL | Status: DC | PRN
  Administered 2012-07-23 – 2012-08-04 (×10): 50 mg via ORAL
  Filled 2012-07-23 (×10): qty 1

## 2012-07-23 MED ORDER — POTASSIUM CHLORIDE CRYS ER 20 MEQ PO TBCR
40.0000 meq | EXTENDED_RELEASE_TABLET | Freq: Two times a day (BID) | ORAL | Status: DC
  Administered 2012-07-23 – 2012-07-25 (×4): 40 meq via ORAL
  Filled 2012-07-23 (×6): qty 2

## 2012-07-23 MED ORDER — INSULIN GLARGINE 100 UNIT/ML ~~LOC~~ SOLN
10.0000 [IU] | Freq: Every day | SUBCUTANEOUS | Status: AC
  Administered 2012-07-24 – 2012-07-26 (×3): 10 [IU] via SUBCUTANEOUS

## 2012-07-23 MED ORDER — SODIUM BICARBONATE 8.4 % IV SOLN
INTRAVENOUS | Status: DC
  Administered 2012-07-23: 12:00:00 via INTRAVENOUS
  Filled 2012-07-23 (×3): qty 100

## 2012-07-23 MED ORDER — ONDANSETRON HCL 4 MG/2ML IJ SOLN: 4.0000 mg | Freq: Four times a day (QID) | INTRAMUSCULAR | Status: DC | PRN

## 2012-07-23 MED ORDER — SODIUM BICARBONATE 8.4 % IV SOLN
INTRAVENOUS | Status: AC
  Administered 2012-07-23: 23:00:00 via INTRAVENOUS
  Filled 2012-07-23 (×3): qty 100

## 2012-07-23 MED ORDER — ALUM & MAG HYDROXIDE-SIMETH 200-200-20 MG/5ML PO SUSP: 30.0000 mL | ORAL | Status: DC | PRN

## 2012-07-23 MED ORDER — ACETAMINOPHEN 325 MG PO TABS: 325.0000 mg | ORAL_TABLET | ORAL | Status: DC | PRN

## 2012-07-23 MED ORDER — WARFARIN VIDEO
Freq: Once | Status: AC
  Administered 2012-07-24: 08:00:00

## 2012-07-23 MED ORDER — PANTOPRAZOLE SODIUM 40 MG PO TBEC
40.0000 mg | DELAYED_RELEASE_TABLET | Freq: Every day | ORAL | Status: DC
  Administered 2012-07-24 – 2012-08-06 (×14): 40 mg via ORAL
  Filled 2012-07-23 (×14): qty 1

## 2012-07-23 MED ORDER — FLEET ENEMA 7-19 GM/118ML RE ENEM: 1.0000 | ENEMA | Freq: Once | RECTAL | Status: AC | PRN

## 2012-07-23 MED ORDER — ONDANSETRON HCL 4 MG PO TABS: 4.0000 mg | ORAL_TABLET | Freq: Four times a day (QID) | ORAL | Status: DC | PRN

## 2012-07-23 MED ORDER — INSULIN ASPART 100 UNIT/ML ~~LOC~~ SOLN
0.0000 [IU] | Freq: Three times a day (TID) | SUBCUTANEOUS | Status: DC
  Administered 2012-07-24: 3 [IU] via SUBCUTANEOUS
  Administered 2012-07-24 – 2012-08-04 (×10): 2 [IU] via SUBCUTANEOUS

## 2012-07-23 MED ORDER — TRAMADOL HCL 50 MG PO TABS
50.0000 mg | ORAL_TABLET | Freq: Four times a day (QID) | ORAL | Status: DC | PRN
  Administered 2012-07-23 – 2012-08-05 (×12): 50 mg via ORAL
  Filled 2012-07-23 (×13): qty 1

## 2012-07-23 MED ORDER — WARFARIN SODIUM 1 MG PO TABS
1.0000 mg | ORAL_TABLET | Freq: Once | ORAL | Status: AC
  Administered 2012-07-23: 1 mg via ORAL
  Filled 2012-07-23: qty 1

## 2012-07-23 MED ORDER — INSULIN ASPART 100 UNIT/ML ~~LOC~~ SOLN: 0.0000 [IU] | Freq: Every day | SUBCUTANEOUS | Status: DC

## 2012-07-23 MED ORDER — BISACODYL 10 MG RE SUPP: 10.0000 mg | Freq: Every day | RECTAL | Status: DC | PRN

## 2012-07-23 MED ORDER — METHOCARBAMOL 500 MG PO TABS
500.0000 mg | ORAL_TABLET | Freq: Four times a day (QID) | ORAL | Status: DC | PRN
  Administered 2012-07-24 – 2012-08-06 (×9): 500 mg via ORAL
  Filled 2012-07-23 (×10): qty 1

## 2012-07-23 MED ORDER — WARFARIN - PHARMACIST DOSING INPATIENT
Freq: Every day | Status: DC
  Administered 2012-07-28 – 2012-08-04 (×4)

## 2012-07-23 MED ORDER — DIPHENHYDRAMINE HCL 12.5 MG/5ML PO ELIX: 12.5000 mg | ORAL_SOLUTION | Freq: Four times a day (QID) | ORAL | Status: DC | PRN

## 2012-07-23 NOTE — H&P (Signed)
Chief Complaint   Problem moving R leg  .  Right acetabular fracture, right distal radius fracture, renal failure.   HPI: Roger Palmer is a 62 y.o. male with history of DM, DDD, who sustained a fall of about 15 ft when he fell out of a tree on 07/11/2012. Pt was using a chainsaw, cutting branches from a tree when he fell off a ladder, landing on his R side. Pt had immediate onset of pain in R arm and R hip along with the inability to bear weight. Work up in ED revealed open right elbow dislocation, right distal radius fracture, displaced right iliac fracture extending to superior and medial acetabular walls and non displaced Right inferior pubic rami fracture. Pt was taken to the OR for I&D with ORIF of his R elbow along with a R radial head replacement and ORIF of the R wrist. He is NWB RUE. While in the OR Dr. Shon Baton placed a skeletal traction pin to help prevent further displacement and protrusion of the femoral head into the the R acetabulum.  Dr. Carola Frost was consulted for input and patient underwent ORIF right acetabulum on 03/04 and is NWB on RLE Was extubated without difficulty on 03/06. Post op has had problems with ileus and urinary retention. CT abdomen with Mild BPH and non obstructing stones left kidney. Dr. Briant Cedar consulted as patient developed on acute renal failure. He felt that patient with ATN from contrast as well as Hypotension in face of ACE and ARB. Medications adjusted with recommendations for close monitoring of I/O and IVF for hydration. Creatinine plateau ed at 4.3-4.5 range. Metabolic acidosis being treated with NaHCO3 IVF and nephrology will continue to monitor for recovery. Right elbow dressing changed and patient placed in posterior splint on 03/12 with recommendations to begin gentle ROM.  Ileus resolving and advanced to regular diet. Has been voiding without difficulty per reports. Has been on zosyn for leucocytosis and empiric treatment for aspiration. Therapies ongoing.  Patient and wife educated on splint care and management. Follow up elbow films today pending. Therapies ongoing and patient noted to have DOE, difficulty maintaining NWB RLE as well as constant VC for sequencing.  Review of Systems  HENT: Negative for hearing loss.  Eyes: Negative for blurred vision and double vision.  Respiratory: Negative for cough, shortness of breath and wheezing.  Cardiovascular: Negative for chest pain and palpitations.  Gastrointestinal: Positive for diarrhea. Negative for heartburn, nausea and vomiting.  Bowel and bladder incontinence due to urgency. Bloating and gas.  Genitourinary: Positive for urgency and frequency.  Musculoskeletal: Positive for myalgias and joint pain.  Neurological: Positive for tremors (RUE). Negative for headaches.  Psychiatric/Behavioral: The patient has insomnia.   Past Medical History   Diagnosis  Date   .  Bee sting allergy    .  Hypercholesteremia    .  Nephrolithiasis    .  Herniated disc      c7-c7, c5-6, c3-4, c4-5   .  Hypertension    .  Back pain    .  Obesity    .  BPH (benign prostatic hypertrophy)    .  Gastritis    .  Reflux    .  Diabetes mellitus without complication     Past Surgical History   Procedure  Laterality  Date   .  Inguinal hernia repair       right   .  Kidney stones       x2 Dr. Earlene Plater   .  Orif r elbow   07/12/2012   .  Orif r wirst   07/12/2012   .  Open reduction internal fixation (orif) distal radial fracture  Right  07/11/2012     Procedure: OPEN REDUCTION INTERNAL FIXATION (ORIF) DISTAL RADIAL FRACTURE; Surgeon: Marlowe Shores, MD; Location: [facility] OR; Service: Orthopedics; Laterality: Right;   .  Orif radial fracture  Right  07/11/2012     Procedure: OPEN REDUCTION INTERNAL FIXATION (ORIF) RADIAL head FRACTURE; Surgeon: Marlowe Shores, MD; Location: [facility] OR; Service: Orthopedics; Laterality: Right;   .  Orif acetabular fracture  Right  07/14/2012     Procedure: OPEN REDUCTION INTERNAL FIXATION  (ORIF) ACETABULAR FRACTURE; Surgeon: Budd Palmer, MD; Location: [facility] OR; Service: Orthopedics; Laterality: Right;    Family History   Problem  Relation  Age of Onset   .  Heart attack  Mother    .  Diabetes  Mother      NIDDM   .  Hypertension  Mother    .  Hypertension  Father    .  Stroke  Father    .  Nephrolithiasis  Brother     Social History: Married. Works as an Art gallery manager for [employer]. Wife at home and can provide supervision. He reports that he has never smoked. He does not have any smokeless tobacco history on file. He reports that drinks alcohol on occasion. He reports that he does not use illicit drugs.  Allergies: No Known Allergies  Medications Prior to Admission   Medication  Sig  Dispense  Refill   .  atorvastatin (LIPITOR) 40 MG tablet  Take 40 mg by mouth daily.     Marland Kitchen  dutasteride (AVODART) 0.5 MG capsule  Take 0.5 mg by mouth daily.     .  fenofibrate micronized (LOFIBRA) 134 MG capsule  Take 134 mg by mouth daily before breakfast.     .  metFORMIN (GLUCOPHAGE) 500 MG tablet  Take 500 mg by mouth daily.     Marland Kitchen  omeprazole (PRILOSEC) 20 MG capsule  Take 20 mg by mouth daily.     .  quinapril (ACCUPRIL) 40 MG tablet  Take 40 mg by mouth at bedtime.     .  Tamsulosin HCl (FLOMAX) 0.4 MG CAPS  Take 0.4 mg by mouth daily.     .  valsartan-hydrochlorothiazide (DIOVAN-HCT) 160-25 MG per tablet  Take 1 tablet by mouth daily.      Home:  Home Living  Lives With: Spouse  Available Help at Discharge: Family  Type of Home: House  Home Access: Stairs to enter;Other (comment)  Home Layout: One level  Bathroom Shower/Tub: Tub/shower unit;Tub only;Walk-in shower  Bathroom Toilet: Handicapped height  Bathroom Accessibility: (pt thinks w/c accessible)  Home Adaptive Equipment: Bedside commode/3-in-1  Functional History:  Prior Function  Able to Take Stairs?: Yes  Driving: Yes  Vocation: Full time employment  Functional Status:  Mobility:  Bed Mobility  Bed  Mobility: Supine to Sit;Sit to Supine  Rolling Left: 3: Mod assist  Left Sidelying to Sit: 1: +2 Total assist;With rails;HOB elevated  Left Sidelying to Sit: Patient Percentage: 50%  Supine to Sit: 1: +2 Total assist;HOB flat;With rails  Supine to Sit: Patient Percentage: 50%  Sit to Supine: 1: +2 Total assist;HOB flat;With rail  Sit to Supine: Patient Percentage: 50%  Transfers  Transfers: Sit to Stand;Stand to Sit;Stand Pivot Transfers  Sit to Stand: 1: +2 Total assist;From bed;With upper extremity assist  Sit  to Stand: Patient Percentage: 60%  Stand to Sit: 1: +2 Total assist;To bed;To chair/3-in-1  Stand to Sit: Patient Percentage: 50%  Stand Pivot Transfers: 1: +2 Total assist  Stand Pivot Transfers: Patient Percentage: 50%  Ambulation/Gait  Ambulation/Gait Assistance: Not tested (comment)  Stairs: No  Wheelchair Mobility  Wheelchair Mobility: No  ADL:  ADL  Eating/Feeding: Simulated;Set up  Where Assessed - Eating/Feeding: Bed level  Grooming: Simulated;Set up  Where Assessed - Grooming: Supine, head of bed up  Upper Body Bathing: Minimal assistance;Simulated  Where Assessed - Upper Body Bathing: Supported sitting  Lower Body Bathing: Simulated;+1 Total assistance  Where Assessed - Lower Body Bathing: Supine, head of bed up;Supine, head of bed flat;Rolling right and/or left;Supported sit to stand  Upper Body Dressing: Simulated;+1 Total assistance  Where Assessed - Upper Body Dressing: Supported sitting  Lower Body Dressing: Simulated;+1 Total assistance  Where Assessed - Lower Body Dressing: Supine, head of bed up;Supine, head of bed flat;Rolling right and/or left;Supported sit to stand  Toilet Transfer: Performed;+2 Total assistance  Toilet Transfer Method: Stand pivot  Toilet Transfer Equipment: Bedside commode  Transfers/Ambulation Related to ADLs: total A +2 (pt=50%--when he tries to push up with LUE; 60% when he pulls up with LUE); unable to ambulate at this time due  to NWB'ing RLE and RUE  ADL Comments: Splint checked. No signs of redness, pain, or pressure. Pt. reports it is fitting well, and denies pain. He was able to assist with donning splint  Cognition:  Cognition  Arousal/Alertness: Awake/alert  Orientation Level: Oriented X4  Cognition  Overall Cognitive Status: Appears within functional limits for tasks assessed/performed  Area of Impairment: Problem solving;Memory  Arousal/Alertness: Awake/alert  Orientation Level: Appears intact for tasks assessed  Behavior During Session: [facility] for tasks performed  Memory Deficits: Pt aware he cannot put weight on RLE or RUE (it is just hard not to on RLE)  Problem Solving: slow to process and follow motor cues, also with minimal conversation  Cognition - Other Comments: flat affect and offers very little information, answers questions slowly  Physical Exam:  Blood pressure 147/78, pulse 96, temperature 97.8 F (36.6 C), temperature source Oral, resp. rate 18, height 5\' 9"  (1.753 m), weight 102.059 kg (225 lb), SpO2 95.00%.  Physical Exam  Nursing note and vitals reviewed.  Constitutional: He is oriented to person, place, and time. He appears well-developed and well-nourished.  Flat affect, appears encephalopathic and fluid overloaded.  HENT:  Head: Normocephalic and atraumatic.  Eyes: Pupils are equal, round, and reactive to light.  Neck: Normal range of motion.  Cardiovascular: Normal rate and regular rhythm.  Pulmonary/Chest: Effort normal and breath sounds normal.  Abdominal: He exhibits distension. There is no tenderness.  Decreased BS. Abdominal and perineum area edematous.  Genitourinary:  3+ scrotal edema with retracted penis.  Musculoskeletal: He exhibits edema.  RUE-1+ edema at hand and dependently at elbow. Elbow incision clean and dry with sutures in place. Abrasion below incision with moderate amount of serous drainage on dressing. RLE- Incision right groin and With sutures in place--clean  and dry without erythema. Abraded lacerated area right lateral thigh with moderate amount of serous drainage. 2+ pitting edema RLE. 1+ edema LLE.  Neurological: He is alert and oriented to person, place, and time.  Speech measured. Follows basic commands without difficulty. Mild delay noted. Muscle spasms RUE when out of brace.  Skin: Skin is warm and dry.  Psychiatric: Thought content normal. His affect is blunt. He is slowed.  Cognition and memory are normal.  Remembers two out of three objects after 2 minute delay in Serial sevens are impaired he gets to 93 but not beyond that In motor strength is 5/5 in the left deltoid, biceps, grip left lower extremity 4 minus at the hip flexor knee extensor ankle dorsiflexor plantar flexor Right lower extremity 3 minus hip flexor knee extensor 4 minus ankle dorsiflexor plantar flexor Right upper extremity is in splint, Supracondylar to hand deltoid is 3 minus strength Results for orders placed during the hospital encounter of 07/11/12 (from the past 48 hour(s))   GLUCOSE, CAPILLARY Status: Abnormal    Collection Time    07/21/12 4:34 PM   Result  Value  Range    Glucose-Capillary  205 (*)  70 - 99 mg/dL    Comment 1  Notify RN     Comment 2  Documented in Chart    GLUCOSE, CAPILLARY Status: Abnormal    Collection Time    07/21/12 9:39 PM   Result  Value  Range    Glucose-Capillary  193 (*)  70 - 99 mg/dL   PROTIME-INR Status: Abnormal    Collection Time    07/22/12 5:45 AM   Result  Value  Range    Prothrombin Time  23.8 (*)  11.6 - 15.2 seconds    INR  2.24 (*)  0.00 - 1.49   CBC Status: Abnormal    Collection Time    07/22/12 5:45 AM   Result  Value  Range    WBC  16.3 (*)  4.0 - 10.5 K/uL    RBC  2.67 (*)  4.22 - 5.81 MIL/uL    Hemoglobin  8.2 (*)  13.0 - 17.0 g/dL    HCT  16.1 (*)  09.6 - 52.0 %    MCV  89.1  78.0 - 100.0 fL    MCH  30.7  26.0 - 34.0 pg    MCHC  34.5  30.0 - 36.0 g/dL    RDW  04.5  40.9 - 81.1 %    Platelets  253   150 - 400 K/uL   BASIC METABOLIC PANEL Status: Abnormal    Collection Time    07/22/12 5:45 AM   Result  Value  Range    Sodium  145  135 - 145 mEq/L    Potassium  3.9  3.5 - 5.1 mEq/L    Chloride  116 (*)  96 - 112 mEq/L    CO2  17 (*)  19 - 32 mEq/L    Glucose, Bld  220 (*)  70 - 99 mg/dL    BUN  52 (*)  6 - 23 mg/dL    Creatinine, Ser  9.14 (*)  0.50 - 1.35 mg/dL    Calcium  8.2 (*)  8.4 - 10.5 mg/dL    GFR calc non Af Amer  13 (*)  >90 mL/min    GFR calc Af Amer  15 (*)  >90 mL/min    Comment:      The eGFR has been calculated     using the CKD EPI equation.     This calculation has not been     validated in all clinical     situations.     eGFR's persistently     <90 mL/min signify     possible Chronic Kidney Disease.   GLUCOSE, CAPILLARY Status: Abnormal    Collection Time    07/22/12 6:57 AM   Result  Value  Range    Glucose-Capillary  195 (*)  70 - 99 mg/dL   GLUCOSE, CAPILLARY Status: Abnormal    Collection Time    07/22/12 11:08 AM   Result  Value  Range    Glucose-Capillary  192 (*)  70 - 99 mg/dL    Comment 1  Notify RN    GLUCOSE, CAPILLARY Status: Abnormal    Collection Time    07/22/12 4:05 PM   Result  Value  Range    Glucose-Capillary  198 (*)  70 - 99 mg/dL   PROTIME-INR Status: Abnormal    Collection Time    07/23/12 5:45 AM   Result  Value  Range    Prothrombin Time  28.2 (*)  11.6 - 15.2 seconds    INR  2.82 (*)  0.00 - 1.49   CBC Status: Abnormal    Collection Time    07/23/12 5:45 AM   Result  Value  Range    WBC  16.0 (*)  4.0 - 10.5 K/uL    RBC  2.62 (*)  4.22 - 5.81 MIL/uL    Hemoglobin  8.0 (*)  13.0 - 17.0 g/dL    HCT  45.4 (*)  09.8 - 52.0 %    MCV  89.3  78.0 - 100.0 fL    MCH  30.5  26.0 - 34.0 pg    MCHC  34.2  30.0 - 36.0 g/dL    RDW  11.9  14.7 - 82.9 %    Platelets  263  150 - 400 K/uL   BASIC METABOLIC PANEL Status: Abnormal    Collection Time    07/23/12 5:45 AM   Result  Value  Range    Sodium  146 (*)  135 - 145  mEq/L    Potassium  4.0  3.5 - 5.1 mEq/L    Chloride  114 (*)  96 - 112 mEq/L    CO2  18 (*)  19 - 32 mEq/L    Glucose, Bld  198 (*)  70 - 99 mg/dL    BUN  46 (*)  6 - 23 mg/dL    Creatinine, Ser  5.62 (*)  0.50 - 1.35 mg/dL    Calcium  8.3 (*)  8.4 - 10.5 mg/dL    GFR calc non Af Amer  13 (*)  >90 mL/min    GFR calc Af Amer  16 (*)  >90 mL/min    Comment:      The eGFR has been calculated     using the CKD EPI equation.     This calculation has not been     validated in all clinical     situations.     eGFR's persistently     <90 mL/min signify     possible Chronic Kidney Disease.   GLUCOSE, CAPILLARY Status: Abnormal    Collection Time    07/23/12 6:53 AM   Result  Value  Range    Glucose-Capillary  182 (*)  70 - 99 mg/dL    No results found.  Post Admission Physician Evaluation:  1. Functional deficits secondary to Fall resulting in R Complex hemipelvis fracture,Right elbow dislocation, right distal radius fracture,As well as mild TBI 2. Patient is admitted to receive collaborative, interdisciplinary care between the physiatrist, rehab nursing staff, and therapy team. 3. Patient's level of medical complexity and substantial therapy needs in context of that medical necessity cannot be provided at a lesser intensity of care such as a SNF.  4. Patient has experienced substantial functional loss from his/her baseline which was documented above under the "Functional History" and "Functional Status" headings. Judging by the patient's diagnosis, physical exam, and functional history, the patient has potential for functional progress which will result in measurable gains while on inpatient rehab. These gains will be of substantial and practical use upon discharge in facilitating mobility and self-care at the household level. 5. Physiatrist will provide 24 hour management of medical needs as well as oversight of the therapy plan/treatment and provide guidance as appropriate regarding the  interaction of the two. 6. 24 hour rehab nursing will assist with bladder management, bowel management, safety, skin/wound care, disease management, medication administration, pain management and patient education and help integrate therapy concepts, techniques,education, etc. 7. PT will assess and treat for/with: Pre-gait, Wheelchair mobility, endurance, Safety, equipment. Goals are: Supervision level for transfers, mod I wheelchair mobility. 8. OT will assess and treat for/with: ADLs, time perceptual skills, safety, endurance, equipment. Goals are: Supervision min assist ADLs. 9. SLP will assess and treat for/with: Cognition, TBI screen. Goals are: Established compensatory strategies for memory issues and attention. 10. Case Management and Social Worker will assess and treat for psychological issues and discharge planning. 11. Team conference will be held weekly to assess progress toward goals and to determine barriers to discharge. 12. Patient will receive at least 3 hours of therapy per day at least 5 days per week. 13. ELOS: 2 week Prognosis: excellent Medical Problem List and Plan:  1. DVT Prophylaxis/Anticoagulation: Pharmaceutical: Coumadin. Lovenox discontinued as INR therapeutic.  2. Pain Management: Will continue prn ultram.  3. Mood: Seems to be flat  4. Neuropsych: This patient is capable of making decisions on his/her own behalf.  5. Right anterior column/anterior wall acetabular Fx: No formal hip precautions. NWB if unable to TDWB RLE.  6. Right distal humerus and Right distal radius fractue: NWB.Will require relocation and splinting of elbow dislocation, orthopedics is aware, At this point will keep in splint nonweightbearing right upper extremity may do range of motion at shoulder7. Leucocytosis: Antibiotics discontinued today. Will monitor for temperature curve for fevers. Continue to monitor wounds. WBC up to 16.0. Will check UA. Most recent CXR with clearing of infiltrates.  8.  Post op ileus: Continue reglan. Add miralax to help with bowel program. Suppository prn no BM in 24 hours.  9. Acute renal failure/Metabolic acidosis: Continue bicarb drip--currently at 200 cc/hr for 15 hours. Renal to follow for assistance.  10. DM Type 2: Will continue to monitor with AC/HS cbg checks. Hgb A1C @6 .5. Elevated BS likely due to stress. SSI for elevated BS and titrate medications as indicated. Off metformin due to renal failure. Will add Amaryl and taper off lantus.  11. GU: Incontinent of urine due to IVF and well as scrotal edema/penis retraction. Will check PVRs. Check UA/UCS.

## 2012-07-23 NOTE — Progress Notes (Signed)
Patient ID: Roger Palmer, male   DOB: [birth year], 62 y.o.   MRN: [id number] Called by dr handy regarding right elbow subluxation after xr this afternoon  Spoke with xr tech who confirmed that splint was removed for xr ray and extended for ap view  Repeat films in splint show persistant subluxation  Splint does not have elbow at 90 degrees flexion  Will redo splint tommorow and flex greater than 90 to facilitate reduction  If subluxation persists it may require operative treatment  Will discuss with dr handy tommorrow

## 2012-07-23 NOTE — Progress Notes (Signed)
Patient admitted at 1750 from unit [facility]. Patient alert and oriented x 3-4 . Right arm edematous brace intact . Right hand edema noted 1 + pitting edema . IV infusing sodium bicarbonate at 200cc / hour  In left hand . Oriented to room and call bell system . Patient and wife verbalized understanding of admission process . Continue with plan of care . Roger Palmer

## 2012-07-23 NOTE — Progress Notes (Signed)
Patient ID: LEIB ELAHI, male   DOB: 12-Feb-1951, 62 y.o.   MRN: [id number]   LOS: 12 days   Subjective: No c/o. Denies N/V.  Objective: Vital signs in last 24 hours: Temp:  [97.8 F (36.6 C)-98 F (36.7 C)] 97.8 F (36.6 C) (03/13 0621) Pulse Rate:  [86-97] 96 (03/13 0621) Resp:  [16-18] 18 (03/13 0621) BP: (125-147)/(69-78) 147/78 mmHg (03/13 0621) SpO2:  [95 %-98 %] 95 % (03/13 0621) Last BM Date: 07/22/12   Laboratory  CBC  Recent Labs  07/22/12 0545 07/23/12 0545  WBC 16.3* 16.0*  HGB 8.2* 8.0*  HCT 23.8* 23.4*  PLT 253 263   BMET  Recent Labs  07/22/12 0545 07/23/12 0545  NA 145 146*  K 3.9 4.0  CL 116* 114*  CO2 17* 18*  GLUCOSE 220* 198*  BUN 52* 46*  CREATININE 4.40* 4.33*  CALCIUM 8.2* 8.3*   Lab Results  Component Value Date   INR 2.82* 07/23/2012   INR 2.24* 07/22/2012   INR 1.40 07/21/2012   CBG (last 3)   Recent Labs  07/22/12 1108 07/22/12 1605 07/23/12 0653  GLUCAP 192* 198* 182*        Physical Exam General appearance: alert and no distress Resp: clear to auscultation bilaterally Cardio: regular rate and rhythm GI: Soft, +BS, mild distension Extremities: NVI   Assessment/Plan: Fall  Open right elbow dislocation/R distal radius fx s/p ORIF  R acetabular fx/R iliac wing fx s/p ORIF  R inf pubic ramus fx  ID - WBC down slightly, afebrile. D/C Zosyn, monitor leukocytosis AKI - renal following, function improving slowly  ABL anemia - Stable FEN - Advance diet VTE - SCD's, coumadin therapeutic, will d/c Lovenox Dispo - Awaiting transfer to CIR    Freeman Caldron, PA-C Pager: [phone] General Trauma PA Pager: [phone]   07/23/2012

## 2012-07-23 NOTE — Progress Notes (Signed)
ANTICOAGULATION CONSULT NOTE - Follow Up Consult  Pharmacy Consult for Coumadin Indication: VTE prophylaxis  No Known Allergies  Patient Measurements: Height: 5\' 9"  (175.3 cm) Weight: 225 lb (102.059 kg) IBW/kg (Calculated) : 70.7 Heparin Dosing Weight:   Vital Signs: Temp: 97.6 F (36.4 C) (03/13 1317) Temp src: Oral (03/13 0621) BP: 147/68 mmHg (03/13 1317) Pulse Rate: 98 (03/13 1317)  Labs:  Recent Labs  07/21/12 0654  07/21/12 0804 07/22/12 0545 07/23/12 0545  HGB  --   < > 8.1* 8.2* 8.0*  HCT  --   --  23.9* 23.8* 23.4*  PLT  --   --  240 253 263  LABPROT 16.8*  --   --  23.8* 28.2*  INR 1.40  --   --  2.24* 2.82*  CREATININE 4.50*  --  4.35* 4.40* 4.33*  < > = values in this interval not displayed.  Estimated Creatinine Clearance: 20.8 ml/min (by C-G formula based on Cr of 4.33).   Medications:  Scheduled:  . coumadin book   Does not apply Once  . insulin aspart  0-15 Units Subcutaneous TID WC  . insulin aspart  0-5 Units Subcutaneous QHS  . insulin glargine  10 Units Subcutaneous Daily  . pantoprazole  40 mg Oral Daily   Or  . pantoprazole (PROTONIX) IV  40 mg Intravenous Daily  . potassium chloride  40 mEq Oral BID  . psyllium  1 packet Oral Daily  . tamsulosin  0.4 mg Oral Daily  . [COMPLETED] warfarin  2.5 mg Oral ONCE-1800  . warfarin   Does not apply Once  . Warfarin - Pharmacist Dosing Inpatient   Does not apply q1800  . [DISCONTINUED] metoCLOPramide (REGLAN) injection  10 mg Intravenous Q6H  . [DISCONTINUED] piperacillin-tazobactam (ZOSYN)  IV  2.25 g Intravenous Q8H    Assessment: 61yo male on Coumadin for VTE px.  INR up to 2.82 this AM, Hg is stable with no bleeding problems noted.    Goal of Therapy:  INR 2-3 Monitor platelets by anticoagulation protocol: Yes   Plan:  1.  Coumadin 1mg  today 2.  F/U in AM  Marisue Humble, PharmD Clinical Pharmacist [facility]

## 2012-07-23 NOTE — Progress Notes (Signed)
Insurance has approved an inpt rehab admission and bed is available today. Pt is aware and in agreement.  [phone]

## 2012-07-23 NOTE — Progress Notes (Signed)
[facility] Progress Note  Subjective  Doing well No new issues or complaints Splint R arm fitting well   Objective  BP 147/78  Pulse 96  Temp(Src) 97.8 F (36.6 C) (Oral)  Resp 18  Ht 5\' 9"  (1.753 m)  Wt 102.059 kg (225 lb)  BMI 33.21 kg/m2  SpO2 95%  Patient Vitals for the past 24 hrs:  BP Temp Temp src Pulse Resp SpO2  07/23/12 0621 147/78 mmHg 97.8 F (36.6 C) Oral 96 18 95 %  07/22/12 2248 145/71 mmHg 98 F (36.7 C) Oral 86 16 97 %  07/22/12 1402 125/69 mmHg 98 F (36.7 C) - 97 18 98 %    Intake/Output     03/12 0701 - 03/13 0700 03/13 0701 - 03/14 0700   P.O. 360    I.V. (mL/kg) 1500 (14.7)    Total Intake(mL/kg) 1860 (18.2)    Urine (mL/kg/hr) 650 (0.3)    Stool     Total Output 650     Net +1210          Urine Occurrence 1007 x 1 x   Stool Occurrence 3 x      Labs Results for Roger Palmer (MRN [id number]) as of 07/23/2012 09:03  Ref. Range 07/23/2012 05:45  Sodium Latest Range: 135-145 mEq/L 146 (H)  Potassium Latest Range: 3.5-5.1 mEq/L 4.0  Chloride Latest Range: 96-112 mEq/L 114 (H)  CO2 Latest Range: 19-32 mEq/L 18 (L)  BUN Latest Range: 6-23 mg/dL 46 (H)  Creatinine Latest Range: 0.50-1.35 mg/dL 4.09 (H)  Calcium Latest Range: 8.4-10.5 mg/dL 8.3 (L)  GFR calc non Af Amer Latest Range: >90 mL/min 13 (L)  GFR calc Af Amer Latest Range: >90 mL/min 16 (L)  Glucose Latest Range: 70-99 mg/dL 811 (H)  WBC Latest Range: 4.0-10.5 K/uL 16.0 (H)  RBC Latest Range: 4.22-5.81 MIL/uL 2.62 (L)  Hemoglobin Latest Range: 13.0-17.0 g/dL 8.0 (L)  HCT Latest Range: 39.0-52.0 % 23.4 (L)  MCV Latest Range: 78.0-100.0 fL 89.3  MCH Latest Range: 26.0-34.0 pg 30.5  MCHC Latest Range: 30.0-36.0 g/dL 91.4  RDW Latest Range: 11.5-15.5 % 15.3  Platelets Latest Range: 150-400 K/uL 263  Prothrombin Time Latest Range: 11.6-15.2 seconds 28.2 (H)  INR Latest Range: 0.00-1.49  2.82 (H)    Exam  Gen: awake and alert, NAD Lungs: clear Cardiac: s1  and s2 Abd:  +BS, NT Pelvis: incisions look great, no signs of infection Ext:      Right Upper Extremity  Splint fitting well       Right Lower Extremity    Swelling stable  Motor and sensory functions intact  TED hose in place   Assessment and Plan  62 y/o male s/p fall   1. Fall 2. R anterior column/anterior wall acetabulum fx: OTA 62-A3             POD 9                         TDWB R leg                         No formal hip precautions                         PT/OT  Dressing changes as needed                                                                                          3. R distal humerus fx and R distal radius fx             Per Dr. Mina Marble            gentle PROM of R elbow and wrist  Splint fabricated, fitting well  4. ABL anemia             Stable at current time             Monitor                5. DVT/PE prophylaxis           Coumadin x 8 weeks due to R acetabulum repair and immobility             6. FEN             per TS 7. Renal failure             Per renal           8. Dispo           therapies             Ortho issues currently stable             Accepted to CIR, just awaiting Bed   Roger Latin, PA-C [facility] [phone] (P) 07/23/2012 9:03 AM

## 2012-07-23 NOTE — PMR Pre-admission (Signed)
PMR Admission Coordinator Pre-Admission Assessment  Patient: Roger Palmer is an 62 y.o., male MRN: [id number] DOB: 1951-01-25 Height: 5\' 9"  (175.3 cm) Weight: 102.059 kg (225 lb)              Insurance Information HMO:     PPO:      PCP:      IPA:      80/20:      OTHER: POS Plan PRIMARY: [employer]      Policy#: [id number]      Subscriber: pt CM Name: Kathleene Hazel      Phone#: [phone]     Fax#: n/a onsite CM Pre-Cert#: [id number]      Employer: group [id number] will be followed by onsite CM Benefits:  Phone #: [phone]     Name: 07/22/12 Eff. Date: 05/13/2010 active     Deduct: $22500      Out of Pocket Max: $3000 includes deductible ( met $1241.38)      Life Max: unlimited CIR: 80%      SNF: 80% Outpatient: 80%     Co-Pay: 30 visits and then need review/ no visit limit Home Health: 80%      Co-Pay: 120 visits combined DME: 80%     Co-Pay: >$1000 must notify Providers: in network  SECONDARY: none        Medicaid Application Date:       Case Manager:  Disability Application Date:       Case Worker: Has applied for short term disability at work  Emergency Conservator, museum/gallery Information   Name Relation Home Work Mobile   [place] [phone]  [phone]     Current Medical History  Patient Admitting Diagnosis: Right distal radius, multiple pelvic fxs, polytrauma  History of Present Illness: Roger Palmer is a 62 y.o. male with history of DM, DDD, who sustained a fall of about 15 ft when he fell out of a tree on 07/11/2012. Pt was using a chainsaw, cutting branches from a tree when he fell off a ladder, landing on his R side. Pt had immediate onset of pain in R arm and R hip along with the inability to bear weight. Work up in ED revealed open right elbow dislocation, right distal radius fracture, displaced right iliac fracture extending to superior and medial acetabular walls and non displaced Right inferior pubic rami fracture. Pt was taken to the OR for  I&D with ORIF of his R elbow along with a R radial head replacement and ORIF of the R wrist. While in the OR Dr. Shon Baton placed a skeletal traction pin to help prevent further displacement and protrusion of the femoral head into the the R acetabulum. He is NWB RUE.   Dr. Carola Frost was consulted for input and patient underwent ORIF right acetabulum on 03/04 and is TDWB on RLE Was extubated without difficulty on 03/06. Post op has had problems with ileus and urinary retention. CT abdomen with Mild BPH and non obstructing stones left kidney. Dr. Briant Cedar consulted as patient developed on acute renal failure. He felt that patient with ATN from contrast as well as Hypotension in face of ACE and ARB. Medications adjusted with recommendations for close monitoring of I/O and IVF for hydration.  On IV zosyn for possible PNA.    Past Medical History  Past Medical History  Diagnosis Date  . Bee sting allergy   . Hypercholesteremia   . Nephrolithiasis   . Herniated disc  c7-c7, c5-6, c3-4, c4-5  . Hypertension   . Back pain   . Obesity   . BPH (benign prostatic hypertrophy)   . Gastritis   . Reflux   . Diabetes mellitus without complication     Family History  family history includes Diabetes in his mother; Heart attack in his mother; Hypertension in his father and mother; Nephrolithiasis in his brother; and Stroke in his father.  Prior Rehab/Hospitalizations: none   Current Medications  Current facility-administered medications:coumadin book, , Does not apply, Once, Fredrik Rigger, RPH;  docusate sodium (COLACE) capsule 100 mg, 100 mg, Oral, BID PRN, Megan Dort, PA-C;  insulin aspart (novoLOG) injection 0-15 Units, 0-15 Units, Subcutaneous, TID WC, Herby Abraham, RPH, 3 Units at 07/23/12 1213;  insulin aspart (novoLOG) injection 0-5 Units, 0-5 Units, Subcutaneous, QHS, Herby Abraham, RPH insulin glargine (LANTUS) injection 10 Units, 10 Units, Subcutaneous, Daily, Megan Dort, PA-C, 10 Units  at 07/23/12 1102;  ondansetron (ZOFRAN) injection 4 mg, 4 mg, Intravenous, Q6H PRN, Liz Malady, MD, 4 mg at 07/20/12 0746;  ondansetron (ZOFRAN) tablet 4 mg, 4 mg, Oral, Q6H PRN, Liz Malady, MD;  pantoprazole (PROTONIX) EC tablet 40 mg, 40 mg, Oral, Daily, Liz Malady, MD, 40 mg at 07/23/12 1101 pantoprazole (PROTONIX) injection 40 mg, 40 mg, Intravenous, Daily, Liz Malady, MD, 40 mg at 07/20/12 1009;  potassium chloride SA (K-DUR,KLOR-CON) CR tablet 40 mEq, 40 mEq, Oral, BID, Fredrik Rigger, RPH, 40 mEq at 07/23/12 1101;  promethazine (PHENERGAN) injection 12.5 mg, 12.5 mg, Intravenous, Q4H PRN, Liz Malady, MD;  psyllium (HYDROCIL/METAMUCIL) packet 1 packet, 1 packet, Oral, Daily, Megan Dort, PA-C sodium bicarbonate 100 mEq in dextrose 5 % 1,000 mL infusion, , Intravenous, Continuous, Jay K. Allena Katz, MD, Last Rate: 200 mL/hr at 07/23/12 1212;  tamsulosin [facility]) capsule 0.4 mg, 0.4 mg, Oral, Daily, Liz Malady, MD, 0.4 mg at 07/23/12 1101;  traMADol (ULTRAM) tablet 50-100 mg, 50-100 mg, Oral, Q6H PRN, Freeman Caldron, PA-C, 50 mg at 07/21/12 2357;  warfarin (COUMADIN) video, , Does not apply, Once, Fredrik Rigger, [facility] Warfarin - Pharmacist Dosing Inpatient, , Does not apply, q1800, Fredrik Rigger, [facility]  Patients Current Diet: Carb Control Regular diet with thin liquids. BSE by SLP on acute and diet cleared for tolerance  Precautions / Restrictions Precautions Precautions: Fall Restrictions Weight Bearing Restrictions: Yes RUE Weight Bearing: Non weight bearing RLE Weight Bearing: Non weight bearing Other Position/Activity Restrictions: Gentle PROM Rt. 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)  Current Functional Level Cognition  Arousal/Alertness: Awake/alert Overall Cognitive Status: Appears within functional limits for tasks assessed/performed Memory Deficits: Pt aware he cannot put weight on RLE or RUE (it is just hard not to on RLE) Orientation Level: Oriented X4 Cognition - Other Comments: flat affect and offers very little information, answers questions slowly    Extremity Assessment (includes Sensation/Coordination)  RUE ROM/Strength/Tone: Deficits RUE ROM/Strength/Tone Deficits: Due to arm cast/splint mid upper arm to hand  RLE ROM/Strength/Tone: Deficits RLE ROM/Strength/Tone Deficits: AAROM  in available range, grossly 2+/3-    ADLs  Eating/Feeding: Simulated;Set up Where Assessed - Eating/Feeding: Bed level Grooming: Simulated;Set up Where  Assessed - Grooming: Supine, head of bed up Upper Body Bathing: Minimal assistance;Simulated Where Assessed - Upper Body Bathing: Supported sitting Lower Body Bathing: Simulated;+1 Total assistance Where Assessed - Lower Body Bathing: Supine, head of bed up;Supine, head of bed flat;Rolling right and/or left;Supported sit to stand Upper Body Dressing: Simulated;+1 Total assistance Where Assessed - Upper Body Dressing: Supported sitting Lower Body Dressing: Simulated;+1 Total assistance Where Assessed - Lower Body Dressing: Supine, head of bed up;Supine, head of bed flat;Rolling right and/or left;Supported sit to stand Toilet Transfer: Performed;+2 Total assistance Toilet Transfer: Patient Percentage: 50% Toilet Transfer Method: Surveyor, minerals: Materials engineer and Hygiene: Performed;+1 Total assistance (with +2 initally sit to stand and pt pulling up bed rail) Where Assessed - Toileting Clothing Manipulation and Hygiene: Standing Transfers/Ambulation  Related to ADLs: total A +2 (pt=50%--when he tries to push up with LUE; 60% when he pulls up with LUE); unable to ambulate at this time due to NWB'ing RLE and RUE ADL Comments: Splint checked. No signs of redness, pain, or pressure.  Pt. reports it is fitting well, and denies pain. He was able to assist with donning splint    Mobility  Bed Mobility: Supine to Sit;Sit to Supine Rolling Left: 3: Mod assist Left Sidelying to Sit: 1: +2 Total assist;With rails;HOB elevated Left Sidelying to Sit: Patient Percentage: 50% Supine to Sit: 1: +2 Total assist;HOB flat;With rails Supine to Sit: Patient Percentage: 50% Sit to Supine: 1: +2 Total assist;HOB flat;With rail Sit to Supine: Patient Percentage: 50%    Transfers  Transfers: Sit to Stand;Stand to Sit;Stand Pivot Transfers Sit to Stand: 1: +2 Total assist;From bed;With upper extremity assist Sit to Stand: Patient Percentage: 60% Stand to Sit: 1: +2 Total assist;To bed;To chair/3-in-1 Stand to Sit: Patient Percentage: 50% Stand Pivot Transfers: 1: +2 Total assist Stand Pivot Transfers: Patient Percentage: 50%    Ambulation / Gait / Stairs / Wheelchair Mobility  Ambulation/Gait Ambulation/Gait Assistance: Not tested (comment) Stairs: No Wheelchair Mobility Wheelchair Mobility: No    Posture / Games developer Sitting - Balance Support: Feet supported;Left upper extremity supported Static Sitting - Level of Assistance: 5: Stand by assistance Static Sitting - Comment/# of Minutes: pt had difficulty getting balance upon initial sitting but once hips were brought to EOB, could maintain without physical assist Static Standing Balance Static Standing - Balance Support: Left upper extremity supported Static Standing - Level of Assistance: 3: Mod assist    Special needs/care consideration Bowel mgmt: continent ; has had ileus problems postop now resolved Bladder mgmt: uses urinal with much difficulty due to scrotal edema.  Has urgency. H/o BPH and acute renal failure currently resolving this admission Diabetic mgmt Hgb A1C 6.5 this admission   Previous Home Environment Living Arrangements: Spouse/significant other Lives With: Spouse Available Help at Discharge: Family Type of Home: House Home Layout: One level Home Access: Stairs to enter;Other (comment) Bathroom Shower/Tub: Tub/shower unit;Tub only;Walk-in shower Bathroom Toilet: Handicapped height Bathroom Accessibility: Yes How Accessible: Accessible via walker Home Care Services: No Additional Comments: brother already working on building a ramp  Discharge Living Setting Plans for Discharge Living Setting: Patient's home;Lives with (comment) Type of Home at Discharge: House Discharge Home Layout: One level Discharge Home Access: Stairs to enter;Other (comment) (brother building a ramp) Discharge Bathroom Shower/Tub: Tub/shower unit;Walk-in shower;Tub only Discharge Bathroom Toilet: Handicapped height Discharge Bathroom Accessibility: Yes How Accessible: Accessible via walker Do you have any problems obtaining your medications?:  No  Social/Family/Support Systems Patient Roles: Spouse;Parent;Other (Comment) (employee) Contact Information: Optician, dispensing, wife Anticipated Caregiver: wife and other family members Anticipated Caregiver's Contact Information: home [phone]; cell [phone] Ability/Limitations of Caregiver: none Caregiver Availability: 24/7 Discharge Plan Discussed with Primary Caregiver: Yes Is Caregiver In Agreement with Plan?: Yes Does Caregiver/Family have Issues with Lodging/Transportation while Pt is in Rehab?: No  Goals/Additional Needs Patient/Family Goal for Rehab: supervision PT, min assist OT, n/a SLP Expected length of stay: ELOS 10 to 12 days Dietary Needs: regular diet with thin liquids; BSE by SLP completed on acute Pt/Family Agrees to Admission and willing to participate: Yes Program Orientation Provided &  Reviewed with Pt/Caregiver Including Roles  & Responsibilities: Yes   Decrease burden of Care through IP rehab admission: n/a  Possible need for SNF placement upon discharge:not anticipated. Has lots of family and friend support.  Patient Condition: Patient has improved medically and functionally since consult on 07/20/12.Diet has progressed to regular diet with thin liquids after resolution of ileus. Acute renal failure improving with Nephrology recommendations for holding ACEi, ARB, and HCTZ. Dfer contrast administration, cont NaHCO3 IVFs and continue to monitor Cr. Nephrology to follow on CIR. Functionally with therapy patient able to stand better using left arm to pull up from rail vs. Pushing off. Overall Max assist with transfers bed to chair. Needs assistance to maintain NWB status on RLE. Patient is very motivated to progress.  Preadmission Screen Completed By:  Clois Dupes, 07/23/2012 1:54 PM ______________________________________________________________________   Discussed status with Dr. Wynn Banker on 07/23/12 at  1353 and received telephone approval for admission today.   Admission Coordinator:  Clois Dupes, time 6578 Date 07/23/12.

## 2012-07-23 NOTE — Discharge Summary (Signed)
Burke Thompson, MD, MPH, FACS Pager: [phone]  

## 2012-07-23 NOTE — Discharge Summary (Signed)
Physician Discharge Summary  Patient ID: Roger Palmer MRN: [id number] DOB/AGE: 13-Apr-1951 62 y.o.  Admit date: 07/11/2012 Discharge date: 07/23/2012  Discharge Diagnoses Patient Active Problem List   Diagnosis Date Noted  . Ileus 07/23/2012  . Acute kidney injury 07/20/2012  . Hypernatremia 07/20/2012  . Aspiration pneumonia 07/20/2012  . Acute respiratory failure following trauma and surgery 07/16/2012  . Acute blood loss anemia 07/14/2012  . Hypokalemia 07/14/2012  . Fall from tree 07/13/2012  . Hypercholesteremia   . Nephrolithiasis   . Hypertension   . Back pain   . Obesity   . BPH (benign prostatic hypertrophy)   . Gastritis   . Reflux   . Diabetes mellitus without complication   . Right acetabular fracture 07/11/2012  . Open dislocation of right elbow 07/11/2012  . Distal radius fracture, right 07/11/2012  . Fracture of iliac wing 07/11/2012  . Pubic ramus fracture 07/11/2012    Consultants Drs. Dahari Shon Baton and Myrene Galas for orthopedic surgery  Dr. Dairl Ponder for hand surgery  Dr. Primitivo Gauze for nephrology  Dr. Faith Rogue for PM&R   Procedures Insertion of distal femur skeletal traction pin by Dr. Shon Baton  Open reduction and internal fixation of right wrist fracture and open treatment of right elbow fracture with radial head replacement and reconstruction of lateral side by Dr. Mina Marble  Open reduction and internal fixation of right acetabular fracture by Dr. Carola Frost    HPI: Clayborne was on a 15-foot ladder cutting tree limbs when he fell. He had no loss of consciousness. He fell on his right side. He was transported via [facility] as a level II trauma. On arrival he was evaluated by emergency department physician and his workup demonstrated an open right elbow dislocation, right distal radius fracture, right iliac fracture, and right acetabular fracture. His initial hemoglobin was noted to be low and he was therefore upgraded to a  level one trauma. He was admitted to the trauma service and orthopedic and hand surgery were consulted.   Hospital Course: The patient was taken to the OR for the first two procedures. He received a blood transfusion of two units around that time. He was then transferred to the ICU for further care. He did well postoperatively. Orthopedic care was turned over to Dr. Carola Frost who took him back to the OR on hospital day #4 for the next procedure. At that time he was starting to show signs of an early ileus. During the case it was thought by the anesthesiologist that the patient had vomited and aspirated. He was transferred back to the ICU on the ventilator and empiric antibiotics were started. He received another two units of packed red blood cells and underwent some fluid resuscitation over the next couple of days. He began to show signs of acute kidney injury and nephrology was consulted. He was able to be extubated and did well from a respiratory status thereafter. Cultures did not show any definitive organism to treat and his antibiotics were stopped after a 7-day course. The patient's ileus also worsened but by hospital day #10 it showed signs of resolving and his diet was able to be advanced back to regular. His renal function also showed slow signs of improving and he did not require hemodialysis. He continued to work with physical and occupational therapies during the time following extubation who recommended inpatient rehabilitation. They were consulted and agreed to admission. Once insurance approval was obtained he was transferred there in improved condition.  Medication List    TAKE these medications       atorvastatin 40 MG tablet  Commonly known as:  LIPITOR  Take 40 mg by mouth daily.     dutasteride 0.5 MG capsule  Commonly known as:  AVODART  Take 0.5 mg by mouth daily.     fenofibrate micronized 134 MG capsule  Commonly known as:  LOFIBRA  Take 134 mg by mouth daily before  breakfast.     FLOMAX 0.4 MG Caps  Generic drug:  tamsulosin  Take 0.4 mg by mouth daily.     metFORMIN 500 MG tablet  Commonly known as:  GLUCOPHAGE  Take 500 mg by mouth daily.     omeprazole 20 MG capsule  Commonly known as:  PRILOSEC  Take 20 mg by mouth daily.     quinapril 40 MG tablet  Commonly known as:  ACCUPRIL  Take 40 mg by mouth at bedtime.     valsartan-hydrochlorothiazide 160-25 MG per tablet  Commonly known as:  DIOVAN-HCT  Take 1 tablet by mouth daily.         Follow-up Information   Follow up with HANDY,MICHAEL H, MD. Schedule an appointment as soon as possible for a visit in 7 days.   Contact information:   [address] [place] [address] [phone]       Schedule an appointment as soon as possible for a visit with Marlowe Shores, MD.   Contact information:   [address] [place] [address] [phone]       Call [facility]. (As needed)    Contact information:   [address] [place] [address] [phone]       Discharge planning took greater than 30 minutes.    Signed: Freeman Caldron, PA-C Pager: [phone] General Trauma PA Pager: [phone]  07/23/2012, 2:50 PM

## 2012-07-23 NOTE — Progress Notes (Signed)
Doing better.  Hope for CIR today. I also spoke to his wife. Patient examined and I agree with the assessment and plan  Violeta Gelinas, MD, MPH, FACS Pager: [phone]  07/23/2012 1:12 PM

## 2012-07-23 NOTE — Progress Notes (Signed)
[facility] - PROGRESS NOTE Resident Note   Please see below for attending addendum to resident note.  Subjective:   No acute events overnight  Objective:    Vital Signs:   Temp:  [97.8 F (36.6 C)-98 F (36.7 C)] 97.8 F (36.6 C) (03/13 0621) Pulse Rate:  [86-97] 96 (03/13 0621) Resp:  [16-18] 18 (03/13 0621) BP: (125-147)/(69-78) 147/78 mmHg (03/13 0621) SpO2:  [95 %-98 %] 95 % (03/13 0621) Last BM Date: 07/22/12  24-hour weight change: Weight change:   Weight trends: Filed Weights   07/12/12 0427  Weight: 225 lb (102.059 kg)    Intake/Output:  03/12 0701 - 03/13 0700 In: 1860 [P.O.:360; I.V.:1500] Out: 650 [Urine:650]  Physical Exam: General: Well-developed, well-nourished, in no acute distress, lying in bed Head: Normocephalic, atraumatic. Eyes: PERRLA, EOMI, No signs of anemia or jaundice. Lungs: Normal respiratory effort. Clear to auscultation in anterior fields bilaterally from apices to bases without crackles or wheezes appreciated. Heart: normal rate, regular rhythm, normal S1 and S2, no gallop, murmur, or rubs appreciated. Abdomen: hypoactive. Obese, taught, non-tender.  Extremities: 1-2+ pretibial edema, distal pulses intact Neurologic: grossly non-focal, alert and oriented x3, appropriate and cooperative throughout examination.   Labs: Basic Metabolic Panel:  Recent Labs Lab 07/18/12 0650 07/18/12 1120 07/19/12 0645  07/21/12 0654 07/21/12 0804 07/22/12 0545  NA 147*  --  149*  < > 145 144 145  K 3.1*  --  3.4*  < > 3.8 3.9 3.9  CL 114*  --  115*  < > 116* 114* 116*  CO2 20  --  19  < > 19 17* 17*  GLUCOSE 202*  --  195*  < > 267* 265* 220*  BUN 56*  --  57*  < > 56* 54* 52*  CREATININE 4.66*  --  4.56*  < > 4.50* 4.35* 4.40*  CALCIUM 8.7  --  8.3*  < > 8.1* 8.2* 8.2*  MG  --  2.9*  --   --   --   --   --   PHOS  --   --  2.1*  --   --   --   --   < > = values in this interval not displayed.  Liver Function  Tests:  Recent Labs Lab 07/19/12 0645  ALBUMIN 2.3*    CBC:  Recent Labs Lab 07/18/12 0650 07/20/12 1120 07/21/12 0804 07/22/12 0545 07/23/12 0545  WBC 13.8* 16.6* 15.9* 16.3* 16.0*  NEUTROABS 11.0* 13.1*  --   --   --   HGB 8.3* 8.4* 8.1* 8.2* 8.0*  HCT 23.7* 24.2* 23.9* 23.8* 23.4*  MCV 86.5 87.1 91.2 89.1 89.3  PLT 230 269 240 253 263     CBG:  Recent Labs Lab 07/21/12 2139 07/22/12 0657 07/22/12 1108 07/22/12 1605 07/23/12 0653  GLUCAP 193* 195* 192* 198* 182*    Microbiology: Results for orders placed during the hospital encounter of 07/11/12  MRSA PCR SCREENING     Status: None   Collection Time    07/12/12 12:52 AM      Result Value Range Status   MRSA by PCR NEGATIVE  NEGATIVE Final   Comment:            The GeneXpert MRSA Assay (FDA     approved for NASAL specimens     only), is one component of a     comprehensive MRSA colonization     surveillance program. It is not  intended to diagnose MRSA     infection nor to guide or     monitor treatment for     MRSA infections.  CULTURE, BLOOD (ROUTINE X 2)     Status: None   Collection Time    07/15/12  1:20 AM      Result Value Range Status   Specimen Description BLOOD LEFT WRIST   Final   Special Requests     Final   Value: BOTTLES DRAWN AEROBIC AND ANAEROBIC 10CC AEROBIC 7CC ANAEROBIC   Culture  Setup Time 07/15/2012 08:23   Final   Culture NO GROWTH 5 DAYS   Final   Report Status 07/21/2012 FINAL   Final  CULTURE, BLOOD (ROUTINE X 2)     Status: None   Collection Time    07/15/12  1:30 AM      Result Value Range Status   Specimen Description BLOOD LEFT HAND   Final   Special Requests BOTTLES DRAWN AEROBIC ONLY [facility]   Final   Culture  Setup Time 07/15/2012 08:23   Final   Culture NO GROWTH 5 DAYS   Final   Report Status 07/21/2012 FINAL   Final  CULTURE, RESPIRATORY (NON-EXPECTORATED)     Status: None   Collection Time    07/15/12  3:35 AM      Result Value Range Status   Specimen  Description TRACHEAL ASPIRATE   Final   Special Requests Normal   Final   Gram Stain     Final   Value: MODERATE WBC PRESENT,BOTH PMN AND MONONUCLEAR     NO SQUAMOUS EPITHELIAL CELLS SEEN     NO ORGANISMS SEEN   Culture MODERATE YEAST CONSISTENT WITH CANDIDA SPECIES   Final   Report Status 07/17/2012 FINAL   Final     Urinalysis:    Component Value Date/Time   COLORURINE YELLOW 07/15/2012 0131   APPEARANCEUR CLEAR 07/15/2012 0131   LABSPEC 1.020 07/15/2012 0131   PHURINE 6.0 07/15/2012 0131   GLUCOSEU >1000* 07/15/2012 0131   HGBUR SMALL* 07/15/2012 0131   BILIRUBINUR NEGATIVE 07/15/2012 0131   KETONESUR NEGATIVE 07/15/2012 0131   PROTEINUR NEGATIVE 07/15/2012 0131   UROBILINOGEN 0.2 07/15/2012 0131   NITRITE NEGATIVE 07/15/2012 0131   LEUKOCYTESUR NEGATIVE 07/15/2012 0131     Imaging: No results found.    Medications:    Infusions: .  sodium bicarbonate  infusion 1000 mL 125 mL/hr at 07/22/12 1550    Scheduled Medications: . coumadin book   Does not apply Once  . insulin aspart  0-15 Units Subcutaneous TID WC  . insulin aspart  0-5 Units Subcutaneous QHS  . insulin glargine  10 Units Subcutaneous Daily  . metoCLOPramide (REGLAN) injection  10 mg Intravenous Q6H  . pantoprazole  40 mg Oral Daily   Or  . pantoprazole (PROTONIX) IV  40 mg Intravenous Daily  . piperacillin-tazobactam (ZOSYN)  IV  2.25 g Intravenous Q8H  . potassium chloride  40 mEq Oral BID  . psyllium  1 packet Oral Daily  . tamsulosin  0.4 mg Oral Daily  . warfarin   Does not apply Once  . Warfarin - Pharmacist Dosing Inpatient   Does not apply q1800    PRN Medications: docusate sodium, ondansetron (ZOFRAN) IV, ondansetron, promethazine, traMADol   Assessment/ Plan:   Patient is a 62 y.o. male with a PMHx of hypertension, DM Type 2, GERD, BPH, and nephrolithiasis who was admitted to [facility] on 07/11/2012 for multiple orthopedic injuries (hip, elbow) secondary  to fall from 15 foot ladder.   1. Acute Renal Failure,  nonoliguric: secondary to ATN from contrast and hypotension in the setting of ACE and ARB, creatinine levels plateaud at 4.3-4.5 currently 4.40 today, mild metabolic acidosis, mild hypernatremia otherwise electrolytes stable (corrected Ca ~9.3), - UOP ~ 1.2L cc yesterday, 650 cc thus far this morning on hypotonic IVF at125 cc/h - cont to hold ACEi, ARB and HCTZ  - defer further contrast administration  - cont NaHCO3 fluids - cont Cr monitoring -will continue to follow if pt released to CIR  Recent Labs Lab 07/19/12 0645 07/20/12 0745 07/21/12 0654 07/21/12 0804 07/22/12 0545  CREATININE 4.56* 4.44* 4.50* 4.35* 4.40*   2. Hypokalemia: resolved with supplementation  Recent Labs Lab 07/19/12 0645 07/20/12 0745 07/21/12 0654 07/21/12 0804 07/22/12 0545  K 3.4* 4.2 3.8 3.9 3.9   3. Hypernatremia: mildly elevate at 146 today -cont to monitor  Recent Labs Lab 07/20/12 0745 07/21/12 0654 07/21/12 0804 07/22/12 0545 07/23/12 0545  NA 147* 145 144 145 146*    4. Metabolic acidosis: mild, AG 14 -cont NaHCO3 IVF -will continue to monitor   5. Nephroliathiasis: stable w/o symptoms -cont to monitor  -consider Urology evaluation   4. Benign Prostatic Hypertrophy: stable -cont tamsulosin per primary team   5. Right Acetabular and right iliac fracture s/p closed fixation: managed per surgery, CT pelvis results pending  6. Right elbow and radial head displacement s/p ORIF: managed per Surgery   7. Hypertension: normotensive - off of bp agents ACEi, ARB, and HCTZ   8. DM Type 2: mildly elevated cbgs, HgbA1c 6.5 - cont management per primary team  CBG (last 3)   Recent Labs  07/22/12 1108 07/22/12 1605 07/23/12 0653  GLUCAP 192* 198* 182*     Length of Stay: 12 days  Patient history and plan of care reviewed with attending, Dr. Allena Katz.   Manuela Schwartz, MD  PGYII, Internal Medicine Resident 07/23/2012, 7:40 AM  I have personally seen and examined  this patient and agree with the assessment/plan as outlined above by Bosie Clos MD (PGY2). Slow renal recovery- will readjust fluid and continue to follow. Awaiting CIR admission. Camron Essman K.,MD 07/23/2012 10:21 AM

## 2012-07-24 ENCOUNTER — Inpatient Hospital Stay (HOSPITAL_COMMUNITY): Payer: 59 | Admitting: Occupational Therapy

## 2012-07-24 ENCOUNTER — Inpatient Hospital Stay (HOSPITAL_COMMUNITY): Payer: 59

## 2012-07-24 ENCOUNTER — Encounter (HOSPITAL_COMMUNITY): Payer: Self-pay | Admitting: General Practice

## 2012-07-24 DIAGNOSIS — R609 Edema, unspecified: Secondary | ICD-10-CM

## 2012-07-24 DIAGNOSIS — S069XAA Unspecified intracranial injury with loss of consciousness status unknown, initial encounter: Secondary | ICD-10-CM

## 2012-07-24 DIAGNOSIS — W11XXXA Fall on and from ladder, initial encounter: Secondary | ICD-10-CM

## 2012-07-24 DIAGNOSIS — S32409A Unspecified fracture of unspecified acetabulum, initial encounter for closed fracture: Secondary | ICD-10-CM

## 2012-07-24 LAB — URINALYSIS, ROUTINE W REFLEX MICROSCOPIC
Bilirubin Urine: NEGATIVE
Glucose, UA: NEGATIVE mg/dL
Ketones, ur: NEGATIVE mg/dL
pH: 7.5 (ref 5.0–8.0)

## 2012-07-24 LAB — RENAL FUNCTION PANEL
Albumin: 2.3 g/dL — ABNORMAL LOW (ref 3.5–5.2)
BUN: 41 mg/dL — ABNORMAL HIGH (ref 6–23)
Chloride: 112 mEq/L (ref 96–112)
GFR calc Af Amer: 17 mL/min — ABNORMAL LOW (ref >90)
Phosphorus: 4.7 mg/dL — ABNORMAL HIGH (ref 2.3–4.6)
Potassium: 4.1 mEq/L (ref 3.5–5.1)
Sodium: 147 mEq/L — ABNORMAL HIGH (ref 135–145)

## 2012-07-24 LAB — PROTIME-INR: INR: 2 — ABNORMAL HIGH (ref 0.00–1.49)

## 2012-07-24 LAB — URINE MICROSCOPIC-ADD ON

## 2012-07-24 LAB — HEPATIC FUNCTION PANEL
AST: 15 U/L (ref 0–37)
Albumin: 2.3 g/dL — ABNORMAL LOW (ref 3.5–5.2)
Bilirubin, Direct: 0.1 mg/dL (ref 0.0–0.3)

## 2012-07-24 LAB — CBC
MCHC: 34.2 g/dL (ref 30.0–36.0)
MCV: 89 fL (ref 78.0–100.0)
Platelets: 301 10*3/uL (ref 150–400)
RDW: 15.5 % (ref 11.5–15.5)
WBC: 16.1 10*3/uL — ABNORMAL HIGH (ref 4.0–10.5)

## 2012-07-24 LAB — GLUCOSE, CAPILLARY
Glucose-Capillary: 142 mg/dL — ABNORMAL HIGH (ref 70–99)
Glucose-Capillary: 62 mg/dL — ABNORMAL LOW (ref 70–99)

## 2012-07-24 MED ORDER — INFLUENZA VIRUS VACC SPLIT PF IM SUSP
0.5000 mL | INTRAMUSCULAR | Status: AC
  Administered 2012-07-25: 0.5 mL via INTRAMUSCULAR
  Filled 2012-07-24: qty 0.5

## 2012-07-24 MED ORDER — PNEUMOCOCCAL VAC POLYVALENT 25 MCG/0.5ML IJ INJ
0.5000 mL | INJECTION | INTRAMUSCULAR | Status: AC
  Administered 2012-07-25: 0.5 mL via INTRAMUSCULAR
  Filled 2012-07-24: qty 0.5

## 2012-07-24 MED ORDER — WARFARIN SODIUM 5 MG PO TABS
5.0000 mg | ORAL_TABLET | Freq: Once | ORAL | Status: AC
  Administered 2012-07-24: 5 mg via ORAL
  Filled 2012-07-24: qty 1

## 2012-07-24 MED ORDER — SODIUM BICARBONATE 8.4 % IV SOLN
INTRAVENOUS | Status: AC
  Administered 2012-07-24 – 2012-07-25 (×2): via INTRAVENOUS
  Filled 2012-07-24 (×6): qty 50

## 2012-07-24 NOTE — Progress Notes (Signed)
Occupational Therapy Session Note  Patient Details  Name: Roger Palmer MRN: [id number] Date of Birth: [birth year]  Today's Date: 07/24/2012 Time: 1330-1420 Time Calculation (min): 50 min  Short Term Goals: Week 1:  OT Short Term Goal 1 (Week 1): Patient will perform UB dressing with moderate assistance OT Short Term Goal 2 (Week 1): Patient will perform LB dressing with maximal assistance OT Short Term Goal 3 (Week 1): Patient will complete grooming tassk of brushing teeth, washing face, and washing hands at sink with minimal assistance OT Short Term Goal 4 (Week 1): Patient will perform toilet transfer with moderate assistance  Skilled Therapeutic Interventions/Progress Updates:  Surgeon present at 1330. Assisted Surgeon with adjusting splint -> ~120* of flexion using heat gun. Suggested patient use sling secondary to arm heavy when up and moving around, Pam, PA to write sling order. After surgeon left patient engaged in bed mobility and performed edge of bed -> w/c slide board transfer with moderate assistance from therapist. Therapist used a TED hose as a sling for now to help hold arm up. Therapist then propelled patient around unit and left patient seated in w/c in family room, RN made aware.   Precautions:  Precautions Precautions: Fall Restrictions Weight Bearing Restrictions: Yes RUE Weight Bearing: Non weight bearing RLE Weight Bearing: Non weight bearing Other Position/Activity Restrictions: Gentle PROM Rt. elbow and wrist per Montez Morita, PA order 07/22/12; in chart prior to CIR eval  See FIM for current functional status  Therapy/Group: Individual Therapy  CLAY,PATRICIA 07/24/2012, 2:29 PM

## 2012-07-24 NOTE — Progress Notes (Signed)
Patient ID: Roger Palmer, male   DOB: 26-Jul-1950, 62 y.o.   MRN: [id number] Have adjusted right elbow splint at bedside to hold in greater than 90 degrees of flexion   Will repeat xrs to reevaluate joint   If subluxation persists dr handy will address next week in my abscence

## 2012-07-24 NOTE — Progress Notes (Signed)
[facility] - PROGRESS NOTE Resident Note   Please see below for attending addendum to resident note.  Subjective:   No acute events overnight, pt transferred to CIR, states doing "alright" with rehab physical therapy  Objective:    Vital Signs:   Temp:  [97.5 F (36.4 C)-99.3 F (37.4 C)] 99.3 F (37.4 C) (03/14 0516) Pulse Rate:  [89-98] 89 (03/14 0516) Resp:  [18-22] 18 (03/14 0516) BP: (121-147)/(64-77) 121/71 mmHg (03/14 0516) SpO2:  [92 %-97 %] 92 % (03/14 0516) Weight:  [251 lb 5.2 oz (114 kg)] 251 lb 5.2 oz (114 kg) (03/14 0500) Last BM Date: 07/23/12  24-hour weight change: Weight change:   Weight trends: Filed Weights   07/24/12 0500  Weight: 251 lb 5.2 oz (114 kg)    Intake/Output:  03/13 0701 - 03/14 0700 In: 720 [P.O.:720] Out: 1700 [Urine:1700]  Physical Exam: General: Well-developed, well-nourished, in no acute distress, sitting in bedside recliner Lungs: Normal respiratory effort. Clear to auscultation bilaterally from apices to bases without crackles or wheezes appreciated. Heart: normal rate, regular rhythm, normal S1 and S2, no gallop, murmur, or rubs appreciated. Abdomen: normoactive, obese, non-tender Extremities: 1-2+ pretibial edema, distal pulses intact Neurologic: grossly non-focal, alert and oriented x3, appropriate and cooperative throughout examination.   Labs: Basic Metabolic Panel:  Recent Labs Lab 07/18/12 0650 07/18/12 1120 07/19/12 0645  07/22/12 0545 07/23/12 0545 07/24/12 0700  NA 147*  --  149*  < > 145 146* 147*  K 3.1*  --  3.4*  < > 3.9 4.0 4.1  CL 114*  --  115*  < > 116* 114* 112  CO2 20  --  19  < > 17* 18* 24  GLUCOSE 202*  --  195*  < > 220* 198* 168*  BUN 56*  --  57*  < > 52* 46* 41*  CREATININE 4.66*  --  4.56*  < > 4.40* 4.33* 3.92*  CALCIUM 8.7  --  8.3*  < > 8.2* 8.3* 8.3*  MG  --  2.9*  --   --   --   --   --   PHOS  --   --  2.1*  --   --   --  4.7*  < > = values in this interval not  displayed.  Liver Function Tests:  Recent Labs Lab 07/19/12 0645 07/24/12 0700  AST  --  15  ALT  --  17  ALKPHOS  --  53  BILITOT  --  0.5  PROT  --  5.7*  ALBUMIN 2.3* 2.3*  2.3*    CBC:  Recent Labs Lab 07/18/12 0650 07/20/12 1120  07/22/12 0545 07/23/12 0545 07/24/12 0700  WBC 13.8* 16.6*  < > 16.3* 16.0* 16.1*  NEUTROABS 11.0* 13.1*  --   --   --   --   HGB 8.3* 8.4*  < > 8.2* 8.0* 8.3*  HCT 23.7* 24.2*  < > 23.8* 23.4* 24.3*  MCV 86.5 87.1  < > 89.1 89.3 89.0  PLT 230 269  < > 253 263 301  < > = values in this interval not displayed.   CBG:  Recent Labs Lab 07/22/12 1605 07/23/12 0653 07/23/12 1149 07/23/12 2118 07/24/12 0714  GLUCAP 198* 182* 180* 191* 155*    Microbiology: Results for orders placed during the hospital encounter of 07/11/12  MRSA PCR SCREENING     Status: None   Collection Time    07/12/12 12:52 AM  Result Value Range Status   MRSA by PCR NEGATIVE  NEGATIVE Final   Comment:            The GeneXpert MRSA Assay (FDA     approved for NASAL specimens     only), is one component of a     comprehensive MRSA colonization     surveillance program. It is not     intended to diagnose MRSA     infection nor to guide or     monitor treatment for     MRSA infections.  CULTURE, BLOOD (ROUTINE X 2)     Status: None   Collection Time    07/15/12  1:20 AM      Result Value Range Status   Specimen Description BLOOD LEFT WRIST   Final   Special Requests     Final   Value: BOTTLES DRAWN AEROBIC AND ANAEROBIC 10CC AEROBIC 7CC ANAEROBIC   Culture  Setup Time 07/15/2012 08:23   Final   Culture NO GROWTH 5 DAYS   Final   Report Status 07/21/2012 FINAL   Final  CULTURE, BLOOD (ROUTINE X 2)     Status: None   Collection Time    07/15/12  1:30 AM      Result Value Range Status   Specimen Description BLOOD LEFT HAND   Final   Special Requests BOTTLES DRAWN AEROBIC ONLY [facility]   Final   Culture  Setup Time 07/15/2012 08:23   Final   Culture  NO GROWTH 5 DAYS   Final   Report Status 07/21/2012 FINAL   Final  CULTURE, RESPIRATORY (NON-EXPECTORATED)     Status: None   Collection Time    07/15/12  3:35 AM      Result Value Range Status   Specimen Description TRACHEAL ASPIRATE   Final   Special Requests Normal   Final   Gram Stain     Final   Value: MODERATE WBC PRESENT,BOTH PMN AND MONONUCLEAR     NO SQUAMOUS EPITHELIAL CELLS SEEN     NO ORGANISMS SEEN   Culture MODERATE YEAST CONSISTENT WITH CANDIDA SPECIES   Final   Report Status 07/17/2012 FINAL   Final     Urinalysis:    Component Value Date/Time   COLORURINE YELLOW 07/15/2012 0131   APPEARANCEUR CLEAR 07/15/2012 0131   LABSPEC 1.020 07/15/2012 0131   PHURINE 6.0 07/15/2012 0131   GLUCOSEU >1000* 07/15/2012 0131   HGBUR SMALL* 07/15/2012 0131   BILIRUBINUR NEGATIVE 07/15/2012 0131   KETONESUR NEGATIVE 07/15/2012 0131   PROTEINUR NEGATIVE 07/15/2012 0131   UROBILINOGEN 0.2 07/15/2012 0131   NITRITE NEGATIVE 07/15/2012 0131   LEUKOCYTESUR NEGATIVE 07/15/2012 0131     Imaging: Dg Elbow 2 Views Right  07/24/2012  *RADIOLOGY REPORT*  Clinical Data: Subluxation  RIGHT ELBOW - 2 VIEW  Comparison: Single lateral view in splint compared to previous study of 07/23/2012  Findings: Radial head prosthesis identified. Mild posterior subluxation of the ulna at the elbow joint is seen with widening of the joint space. No ulnar dislocation is identified. No fracture or bone destruction seen.  IMPRESSION: Mild posterior subluxation of the ulna at the elbow joint with minimal joint space widening. No evidence of ulnar dislocation. Again under radial head prosthesis.   Original Report Authenticated By: Ulyses Southward, M.D.    Dg Elbow 2 Views Right  07/23/2012  *RADIOLOGY REPORT*  Clinical Data:  Previous surgery to the right radius.  RIGHT ELBOW - 2 VIEW  Comparison: 07/23/2012  Findings: Two views of the right elbow were obtained.  Again noted is replacement of the radial head. The elbow appears to be  dislocated posteriorly on the lateral view.  Again seen are small bone fragments along the anterior aspect of the distal humerus. There is some irregularity along the ulnar articulating surface. Evidence for soft tissue swelling.  IMPRESSION: Posterior dislocation of the right elbow.  These results were called by telephone on 07/23/2012 at 5:18 p.m. to Montez Morita, PA-C,, who verbally acknowledged these results.   Original Report Authenticated By: Richarda Overlie, M.D.    Dg Forearm Right  07/23/2012  *RADIOLOGY REPORT*  Clinical Data: Postop fracture fixation  RIGHT FOREARM - 2 VIEW  Comparison: 07/11/2012  Findings: Comminuted fracture distal radius has been fixed with a ventral plate and screws.  The fracture extends into the wrist joint.  The alignment appears satisfactory however the lateral view is obliqued.  There  has been resection of the radial head and placement of a prosthesis.  There are several small bone fragments anterior to the elbow joint which is satisfactorily located following prior dislocation.  IMPRESSION: Postsurgical changes as above.   Original Report Authenticated By: Janeece Riggers, M.D.       Medications:    Infusions: .  sodium bicarbonate  infusion 1000 mL Stopped (07/24/12 0300)    Scheduled Medications: . glimepiride  2 mg Oral Q breakfast  . insulin aspart  0-15 Units Subcutaneous TID WC  . insulin aspart  0-5 Units Subcutaneous QHS  . insulin glargine  10 Units Subcutaneous Daily  . pantoprazole  40 mg Oral Daily  . polyethylene glycol  17 g Oral Daily  . potassium chloride  40 mEq Oral BID  . tamsulosin  0.4 mg Oral Daily  . warfarin   Does not apply Once  . Warfarin - Pharmacist Dosing Inpatient   Does not apply q1800    PRN Medications: acetaminophen, alum & mag hydroxide-simeth, bisacodyl, diphenhydrAMINE, guaiFENesin-dextromethorphan, methocarbamol, ondansetron (ZOFRAN) IV, ondansetron, traMADol, traZODone   Assessment/ Plan:   Patient is a 62 y.o. male  with a PMHx of hypertension, DM Type 2, GERD, BPH, and nephrolithiasis who was admitted to [facility] on 07/11/2012 for multiple orthopedic injuries (hip, elbow) secondary to fall from 15 foot ladder.   1. Acute Renal Failure, nonoliguric: secondary to ATN from contrast and hypotension in the setting of ACE and ARB, creatinine level improved today at 3.92 from 4.33 yesterday, mild hypernatremia otherwise electrolytes stable (corrected Ca ~9.3), - UOP ~ 1.7Lcc yesterday, 375cc thus far this morning on hypotonic IVF 200 cc/h - cont to hold ACEi, ARB and HCTZ  - defer further contrast administration  - cont NaHCO3 fluids - cont Cr monitoring   Recent Labs Lab 07/21/12 0654 07/21/12 0804 07/22/12 0545 07/23/12 0545 07/24/12 0700  CREATININE 4.50* 4.35* 4.40* 4.33* 3.92*   2. Hypokalemia: resolved with supplementation  Recent Labs Lab 07/21/12 0654 07/21/12 0804 07/22/12 0545 07/23/12 0545 07/24/12 0700  K 3.8 3.9 3.9 4.0 4.1   3. Hypernatremia: mildly elevate at 147 today -cont to monitor  Recent Labs Lab 07/21/12 0654 07/21/12 0804 07/22/12 0545 07/23/12 0545 07/24/12 0700  NA 145 144 145 146* 147*    4. Metabolic acidosis: resolved -cont NaHCO3 IVF   BMET    Component Value Date/Time   NA 147* 07/24/2012 0700   K 4.1 07/24/2012 0700   CL 112 07/24/2012 0700   CO2 24 07/24/2012 0700   GLUCOSE 168* 07/24/2012 0700  BUN 41* 07/24/2012 0700   CREATININE 3.92* 07/24/2012 0700   CALCIUM 8.3* 07/24/2012 0700   GFRNONAA 15* 07/24/2012 0700   GFRAA 17* 07/24/2012 0700     5. Nephroliathiasis: stable w/o symptoms  4. Benign Prostatic Hypertrophy: stable -cont tamsulosin   5. Right Acetabular and right iliac fracture s/p closed fixation: f/u per Surgery  6. Right elbow and radial head displacement s/p ORIF: f/u per Surgery  7. Hypertension: normotensive - off of bp agents ACEi, ARB, and HCTZ   8. DM Type 2: mildly elevated cbgs, HgbA1c 6.5 CBG (last 3)   Recent Labs   07/23/12 1149 07/23/12 2118 07/24/12 0714  GLUCAP 180* 191* 155*     Length of Stay: 1 days  Patient history and plan of care reviewed with attending, Dr. Allena Katz.   Manuela Schwartz, MD  PGYII, Internal Medicine Resident 07/24/2012, 7:59 AM  I have personally seen and examined this patient and agree with the assessment/plan as outlined above by Bosie Clos MD (PGY2). Plan for 4L of hypotonic bicarb today-- renal function improving. PATEL,JAY K.,MD 07/24/2012 10:31 AM

## 2012-07-24 NOTE — Progress Notes (Signed)
Social Work  Social Work Assessment and Plan  Patient Details  Name: Roger Palmer MRN: [id number] Date of Birth: 08/03/1950  Today's Date: 07/24/2012  Problem List:  Patient Active Problem List  Diagnosis  . Right acetabular fracture  . Open dislocation of right elbow  . Distal radius fracture, right  . Fracture of iliac wing  . Pubic ramus fracture  . Fall from tree  . Hypercholesteremia  . Nephrolithiasis  . Hypertension  . Back pain  . Obesity  . BPH (benign prostatic hypertrophy)  . Gastritis  . Reflux  . Diabetes mellitus without complication  . Acute blood loss anemia  . Hypokalemia  . Acute respiratory failure following trauma and surgery  . Acute kidney injury  . Hypernatremia  . Aspiration pneumonia  . Ileus  . Brain injury without skull fracture   Past Medical History:  Past Medical History  Diagnosis Date  . Bee sting allergy   . Hypercholesteremia   . Nephrolithiasis   . Herniated disc     c7-c7, c5-6, c3-4, c4-5  . Hypertension   . Back pain   . Obesity   . BPH (benign prostatic hypertrophy)   . Gastritis   . Reflux   . Diabetes mellitus without complication    Past Surgical History:  Past Surgical History  Procedure Laterality Date  . Inguinal hernia repair      right  . Kidney stones      x2 Dr. Earlene Plater   . Orif r elbow  07/12/2012  . Orif r wirst  07/12/2012  . Open reduction internal fixation (orif) distal radial fracture Right 07/11/2012    Procedure: OPEN REDUCTION INTERNAL FIXATION (ORIF) DISTAL RADIAL FRACTURE;  Surgeon: Marlowe Shores, MD;  Location: [facility] OR;  Service: Orthopedics;  Laterality: Right;  . Orif radial fracture Right 07/11/2012    Procedure: OPEN REDUCTION INTERNAL FIXATION (ORIF) RADIAL head FRACTURE;  Surgeon: Marlowe Shores, MD;  Location: [facility] OR;  Service: Orthopedics;  Laterality: Right;  . Orif acetabular fracture Right 07/14/2012    Procedure: OPEN REDUCTION INTERNAL FIXATION (ORIF) ACETABULAR FRACTURE;  Surgeon:  Budd Palmer, MD;  Location: [facility] OR;  Service: Orthopedics;  Laterality: Right;   Social History:  reports that he has never smoked. He does not have any smokeless tobacco history on file. He reports that  drinks alcohol. He reports that he does not use illicit drugs.  Family / Support Systems Marital Status: Married Patient Roles: Spouse;Parent;Other (Comment) (employee) Spouse/Significant Other: wife, Maurizio Geno @ (H) [phone] or (C) [phone] Children: two adult daughters with one local, Amy, who is a Engineer, site but very supportive and involved Anticipated Caregiver: wife and other family members Ability/Limitations of Caregiver: none Caregiver Availability: 24/7 Family Dynamics: as noted, wife and daughter both very involved and supportive.  Wife not working and is available to provide 24/7 care  Social History Preferred language: English Religion: Non-Denominational Cultural Background: NA Education: HS Read: Yes Write: Yes Employment Status: Employed Name of Employer: [employer] of Employment: 44 (years) Return to Work Plans: Yes, plan to return when able. Legal Hisotry/Current Legal Issues: None Guardian/Conservator: None   Abuse/Neglect Physical Abuse: Denies Verbal Abuse: Denies Sexual Abuse: Denies Exploitation of patient/patient's resources: Denies Self-Neglect: Denies  Emotional Status Pt's affect, behavior adn adjustment status: Pt admits he is fatigued from start of therapies, however, optimistic he will make good gains on CIR.  Denies any significant emotional distress beyond frustration with accident itself and his current  functional limitations. Recent Psychosocial Issues: None Pyschiatric History: None Substance Abuse History: None  Patient / Family Perceptions, Expectations & Goals Pt/Family understanding of illness & functional limitations: Pt and wife with basic understanding of multiple injuries he suffered in fall and of WB  restrictions/ limitations. Premorbid pt/family roles/activities: Pt is sole financial support to family.  Wife notes that she "takes medication.Marland KitchenMarland KitchenI have a problem with anxiety. Not him."  Appears to be a very traditional role family with wife maintaining the household and pt handling financial management. Anticipated changes in roles/activities/participation: Wife may have to assume some duties usually covered by pt - at least temporarily.  Wife to become caregiver to pt/ husband. Pt/family expectations/goals: "I need to be able to do as much for myself as I can"  [employer]: None Premorbid Home Care/DME Agencies: None Transportation available at discharge: yes Resource referrals recommended: Psychology  Discharge Planning Living Arrangements: Spouse/significant other Support Systems: Spouse/significant other;Children;Church/faith community;Friends/neighbors Type of Residence: Private residence Community education officer Resources: Media planner (specify) Education officer, museum) Financial Resources: Employment Financial Screen Referred: No Living Expenses: Own Money Management: Patient Do you have any problems obtaining your medications?: No Home Management: wife Patient/Family Preliminary Plans: Pt plans to return home with his wife providing primary care and then intermittent support of daughter. Barriers to Discharge: Steps (ramp being built) Social Work Anticipated Follow Up Needs: HH/OP Expected length of stay: ELOS 10 to 14 days  Clinical Impression Pleasant, oriented gentleman here after a fall from a tree with multiple injuries.  Reports he is motivated for CIR and hopeful he will make good gains.  Wife at bedside.  Admits she has "issues" with anxiety and is medicated for this.  Wife aware that she will need to assume a primary caregiver role until husband returns to prior independence. Will follow for support and d/c planning needs.  HOYLE, LUCY 07/24/2012, 2:58 PM

## 2012-07-24 NOTE — Plan of Care (Signed)
Overall Plan of Care [facility]) Patient Details Name: Roger Palmer MRN: [id number] DOB: October 22, 1950  Diagnosis:  Right pelvic trauma, shouulder dislocations   Co-morbidities: pain mgt, post op ileus, dm2  Functional Problem List  Patient demonstrates impairments in the following areas: Balance, Bladder, Bowel, Edema, Endurance, Medication Management, Pain, Safety and Skin Integrity  Basic ADL's: eating, grooming, bathing, dressing and toileting Advanced ADL's: simple meal preparation  Transfers:  bed mobility, bed to chair, toilet, tub/shower, car and furniture Locomotion:  ambulation and wheelchair mobility  Additional Impairments:  Leisure Awareness  Anticipated Outcomes Item Anticipated Outcome  Eating/Swallowing    Basic self-care  Minimal assistance  Tolieting  Minimal  assistance  Bowel/Bladder  Minimal assistance  Transfers  S basic; min A car transfer  Locomotion  Mod I w/c mobility  Communication    Cognition    Pain  Less than3 on a scale of 0-10.  Safety/Judgment  Min. assistance  Other     Therapy Plan: PT Intensity: Minimum of 1-2 x/day ,45 to 90 minutes PT Frequency: 5 out of 7 days PT Duration Estimated Length of Stay: 10-14 days OT Intensity: Minimum of 1-2 x/day, 45 to 90 minutes OT Frequency: 5 out of 7 days OT Duration/Estimated Length of Stay: 2.5-3 weeks      Team Interventions: Item RN PT OT SLP SW TR Other  Self Care/Advanced ADL Retraining  x x      Neuromuscular Re-Education  x x      Therapeutic Activities  x x      UE/LE Strength Training/ROM  x x      UE/LE Coordination Activities  x x      Visual/Perceptual Remediation/Compensation         DME/Adaptive Equipment Instruction  x x      xTherapeutic Exercise  x x      Balance/Vestibular Training  x x      Patient/Family Education x x x      Cognitive Remediation/Compensation         Functional Mobility Training  x x      Ambulation/Gait Training  x       Higher education careers adviser Propulsion/Positioning  x x      Functional Statistician   x      Community Reintegration  x x      Dysphagia/Aspiration Film/video editor         Bladder Management         Bowel Management x        Disease Management/Prevention x        Pain Management x x x      Medication Management x        Skin Care/Wound Management x x x      Splinting/Orthotics  x x      Discharge Planning x x x      Psychosocial Support x x x                             Team Discharge Planning: Destination: PT-Home ,OT- Home , SLP-Home Projected Follow-up: PT-Home health PT, OT-  Home health OT, SLP-None Projected Equipment Needs: PT-Wheelchair (measurements);Wheelchair cushion (measurements);Sliding board (20x18 with elevating leg rests), OT- Tub/shower seat;Tub/shower bench (shower seat vs tub bench), SLP-None recommended by SLP Patient/family involved in discharge planning: PT- Patient;Family member/caregiver,  OT-Patient, SLP-Patient   See Team Conference Notes for weekly updates to the plan of care MD ELOS: 2 weeks Medical Rehab Prognosis:  Excellent Assessment: The patient has been admitted for CIR therapies. The team will be addressing, functional mobility, strength, stamina, balance, safety, adaptive techniques/equipment, self-care, bowel and bladder mgt, patient and caregiver education, pain mgt, ortho precautions. Goals have been set at min assist to supervision with self-care and mod I with wheelchair mobility.       See Team Conference Notes for weekly updates to the plan of care

## 2012-07-24 NOTE — Evaluation (Signed)
Occupational Therapy Assessment and Plan & Session Notes  Patient Details  Name: Roger Palmer MRN: [id number] Date of Birth: November 02, 1950  OT Diagnosis: abnormal posture, acute pain, muscle weakness (generalized) and swelling of limb Rehab Potential: Rehab Potential: Good ELOS: 2.5-3 weeks   Today's Date: 07/24/2012  ASSESSMENT AND PLAN  Problem List:  Patient Active Problem List  Diagnosis  . Right acetabular fracture  . Open dislocation of right elbow  . Distal radius fracture, right  . Fracture of iliac wing  . Pubic ramus fracture  . Fall from tree  . Hypercholesteremia  . Nephrolithiasis  . Hypertension  . Back pain  . Obesity  . BPH (benign prostatic hypertrophy)  . Gastritis  . Reflux  . Diabetes mellitus without complication  . Acute blood loss anemia  . Hypokalemia  . Acute respiratory failure following trauma and surgery  . Acute kidney injury  . Hypernatremia  . Aspiration pneumonia  . Ileus    Past Medical History:  Past Medical History  Diagnosis Date  . Bee sting allergy   . Hypercholesteremia   . Nephrolithiasis   . Herniated disc     c7-c7, c5-6, c3-4, c4-5  . Hypertension   . Back pain   . Obesity   . BPH (benign prostatic hypertrophy)   . Gastritis   . Reflux   . Diabetes mellitus without complication    Past Surgical History:  Past Surgical History  Procedure Laterality Date  . Inguinal hernia repair      right  . Kidney stones      x2 Dr. Earlene Plater   . Orif r elbow  07/12/2012  . Orif r wirst  07/12/2012  . Open reduction internal fixation (orif) distal radial fracture Right 07/11/2012    Procedure: OPEN REDUCTION INTERNAL FIXATION (ORIF) DISTAL RADIAL FRACTURE;  Surgeon: Marlowe Shores, MD;  Location: [facility] OR;  Service: Orthopedics;  Laterality: Right;  . Orif radial fracture Right 07/11/2012    Procedure: OPEN REDUCTION INTERNAL FIXATION (ORIF) RADIAL head FRACTURE;  Surgeon: Marlowe Shores, MD;  Location: [facility] OR;  Service:  Orthopedics;  Laterality: Right;  . Orif acetabular fracture Right 07/14/2012    Procedure: OPEN REDUCTION INTERNAL FIXATION (ORIF) ACETABULAR FRACTURE;  Surgeon: Budd Palmer, MD;  Location: [facility] OR;  Service: Orthopedics;  Laterality: Right;    Clinical Impression:   Right acetabular fracture, right distal radius fracture, renal failure.   HPI: Roger Palmer is a 62 y.o. male with history of DM, DDD, who sustained a fall of about 15 ft when he fell out of a tree on 07/11/2012. Pt was using a chainsaw, cutting branches from a tree when he fell off a ladder, landing on his R side. Pt had immediate onset of pain in R arm and R hip along with the inability to bear weight. Work up in ED revealed open right elbow dislocation, right distal radius fracture, displaced right iliac fracture extending to superior and medial acetabular walls and non displaced Right inferior pubic rami fracture. Pt was taken to the OR for I&D with ORIF of his R elbow along with a R radial head replacement and ORIF of the R wrist. He is NWB RUE. While in the OR Dr. Shon Baton placed a skeletal traction pin to help prevent further displacement and protrusion of the femoral head into the the R acetabulum.   Dr. Carola Frost was consulted for input and patient underwent ORIF right acetabulum on 03/04 and is NWB on RLE Was  extubated without difficulty on 03/06. Post op has had problems with ileus and urinary retention. CT abdomen with Mild BPH and non obstructing stones left kidney. Dr. Briant Cedar consulted as patient developed on acute renal failure. He felt that patient with ATN from contrast as well as Hypotension in face of ACE and ARB. Medications adjusted with recommendations for close monitoring of I/O and IVF for hydration. Creatinine plateau ed at 4.3-4.5 range. Metabolic acidosis being treated with NaHCO3 IVF and nephrology will continue to monitor for recovery. Right elbow dressing changed and patient placed in posterior splint on 03/12 with  recommendations to begin gentle ROM.  Ileus resolving and advanced to regular diet. Has been voiding without difficulty per reports. Has been on zosyn for leucocytosis and empiric treatment for aspiration. Therapies ongoing. Patient and wife educated on splint care and management. Follow up elbow films today pending. Therapies ongoing and patient noted to have DOE, difficulty maintaining NWB RLE as well as constant VC for sequencing.   Patient transferred to CIR on 07/23/2012 .    Patient currently requires total -> total assist X2 with basic self-care skills and IADL secondary to muscle weakness and muscle joint tightness and decreased sitting balance, decreased standing balance, decreased postural control, decreased balance strategies and difficulty maintaining precautions.  Prior to hospitalization, patient could complete ADLs and IADLs independently.   Patient will benefit from skilled intervention to increase independence with basic self-care skills prior to discharge home with care partner.  Anticipate patient will require minimal physical assistance and follow up home health.  OT - End of Session Activity Tolerance: Tolerates 10 - 20 min activity with multiple rests Endurance Deficit: Yes OT Assessment Rehab Potential: Good Barriers to Discharge: None (none known at this time) OT Plan OT Intensity: Minimum of 1-2 x/day, 45 to 90 minutes OT Frequency: 5 out of 7 days OT Duration/Estimated Length of Stay: 2.5-3 weeks OT Treatment/Interventions: Balance/vestibular training;Community reintegration;Discharge planning;DME/adaptive equipment instruction;Functional mobility training;Neuromuscular re-education;Pain management;Functional electrical stimulation;Patient/family education;Psychosocial support;Self Care/advanced ADL retraining;Skin care/wound managment;Splinting/orthotics;Therapeutic Activities;Therapeutic Exercise;UE/LE Strength taining/ROM;UE/LE Coordination activities;Wheelchair  propulsion/positioning OT Recommendation Recommendations for Other Services:  (none at this time) Patient destination: Home Equipment Recommended: Tub/shower seat;Tub/shower bench (shower seat vs tub bench)  Precautions/Restrictions  Precautions Precautions: Fall Restrictions Weight Bearing Restrictions: Yes RUE Weight Bearing: Non weight bearing RLE Weight Bearing: Non weight bearing Other Position/Activity Restrictions: Gentle PROM Rt. elbow and wrist per Montez Morita, PA order 07/22/12; in chart prior to CIR eval  General Chart Reviewed: Yes Family/Caregiver Present: No  Vital Signs Therapy Vitals Temp: 99.3 F (37.4 C) Pulse Rate: 89 Resp: 18 BP: 121/71 mmHg Patient Position, if appropriate: Lying Oxygen Therapy SpO2: 92 % O2 Device: None (Room air)  Pain Pain Assessment Pain Assessment: 0-10 Pain Score:   2 Pain Type: Acute pain;Surgical pain Pain Location: Leg Pain Orientation: Right Pain Descriptors: Aching Pain Intervention(s): RN made aware  Home Living/Prior Functioning Home Living Lives With: Spouse Available Help at Discharge: Family;Available 24 hours/day Type of Home: House Home Access: Stairs to enter;Ramped entrance Entrance Stairs-Number of Steps: Patient states they are currently building a ramped entrance  Home Layout: One level Training and development officer style) Bathroom Shower/Tub: Tub/shower unit;Curtain;Walk-in shower Bathroom Toilet: Handicapped height How Accessible: Accessible via wheelchair Home Adaptive Equipment: Bedside commode/3-in-1 IADL History Homemaking Responsibilities: Yes Meal Prep Responsibility: Secondary Laundry Responsibility: Secondary Cleaning Responsibility: Secondary Bill Paying/Finance Responsibility: Secondary Shopping Responsibility: Secondary Current License: Yes Mode of Transportation: Car Occupation: Full time employment Type of Occupation: Art gallery manager for [employer]  Prior Function Level of Independence: Independent with  basic ADLs;Independent with gait;Independent with transfers Able to Take Stairs?: Yes Driving: Yes  ADL - See FIM  Vision/Perception  Vision - History Baseline Vision: Wears glasses all the time (hasn't worn them since his hospital stay) Patient Visual Report: No change from baseline Vision - Assessment Eye Alignment: Within Functional Limits Perception Perception: Within Functional Limits Praxis Praxis: Intact   Cognition Overall Cognitive Status: Appears within functional limits for tasks assessed Arousal/Alertness: Awake/alert Orientation Level: Oriented X4 Memory: Appears intact Awareness: Appears intact Problem Solving: Appears intact Safety/Judgment: Appears intact  Sensation Sensation Light Touch: Impaired by gross assessment Additional Comments: Patient reports some tingling in right hand/fingers, LUE appear intact Coordination Gross Motor Movements are Fluid and Coordinated: No Fine Motor Movements are Fluid and Coordinated: No Coordination and Movement Description: Left hand/UE is WFL, right hand/UE has decreased gross and fine motor movements & increased swelling. Right UE is in cast and unable to fully assess  Motor - See Evaluation Navigator  Mobility - See Evaluation Navigator  Trunk/Postural Assessment - See Evaluation Navigator  Balance- See Evaluation Navigator  Extremity/Trunk Assessment RUE Assessment RUE Assessment: Exceptions to [facility] RUE AROM (degrees) RUE Overall AROM Comments: RUE in cast/aplint and unable to fully assess. Patient unable to actively flex shoulder and when passivly ranged patient with complaints of pain in elbow and wrist. LUE Assessment LUE Assessment: Within Functional Limits  FIM:  FIM - Eating Eating Activity: 5: Set-up assist for open containers FIM - Grooming Grooming: 0: Activity did not occur FIM - Bathing Bathing: 0: Activity did not occur FIM - Upper Body Dressing/Undressing Upper body dressing/undressing: 1:  Total-Patient completed less than 25% of tasks FIM - Lower Body Dressing/Undressing Lower body dressing/undressing: 1: Two helpers FIM - Toileting Toileting: 0: Activity did not occur FIM - Banker Devices: Sliding board Bed/Chair Transfer: 3: Supine > Sit: Mod A (lifting assist/Pt. 50-74%/lift 2 legs;1: Two helpers FIM - Archivist Transfers: 0-Activity did not occur FIM - IT consultant Transfers: 0-Activity did not occur or was simulated   Refer to Care Plan for Long Term Goals  Recommendations for other services: None at this time.   Discharge Criteria: Patient will be discharged from OT if patient refuses treatment 3 consecutive times without medical reason, if treatment goals not met, if there is a change in medical status, if patient makes no progress towards goals or if patient is discharged from hospital.  The above assessment, treatment plan, treatment alternatives and goals were discussed and mutually agreed upon: by patient  -----------------------------------------------------------------------------------------------------------------  SESSION NOTES  Session #1 [phone] - 60 Minutes Individual Therapy Patient with 2/10 complaints of pain in RLE, RN aware Initial 1:1 occupational therapy evaluation completed. Focused skilled intervention on bed mobility, LB dressing in sit<>stand position with total assist X2, NWB -> RUE & RLE, edge of bed -> w/c slide board transfer with total assist X2, education on safest and most effective way to perform slide board transfers, UB dressing in seated position, w/c positioning, and education on how to decrease swelling in right hand/fingers. At end of session left patient seated in w/c with bed side table in front of him and call bell & phone within reach, patient also left with pillow under RUE.   Session #2 [id number] - 20 Minutes Patient missed 10 minutes of  skilled occupational therapy secondary to Doppler Scan, RN aware No complaints of pain Patient found supine in bed, cold  and tired. Patient willing to work with occupational therapist. Therapist gathered item for patient to use for squeezing in Rhand to help decrease swelling throughout hand/fingers. Therapist then assisted patient with donning hospital gown and assisted with re-positioning RUE. When attempting to sit edge of bed, staff member to perform Doppler Scan appeared, therefore patient missed 10 minutes of therapy. Surgeon, Dr. Mina Marble, supposed to be present during session to discuss splint -> RUE (talked with his secretary this am).   CLAY,PATRICIA 07/24/2012, 8:39 AM

## 2012-07-24 NOTE — Evaluation (Signed)
Physical Therapy Assessment and Plan  Patient Details  Name: Roger Palmer MRN: [id number] Date of Birth: [birth year]  PT Diagnosis: Difficulty walking, Edema, Muscle weakness and Pain in RUE and RLE Rehab Potential: Good ELOS: 10-14 days   Today's Date: 07/24/2012 Time: 1308-6578 Time Calculation (min): 58 min  Problem List:  Patient Active Problem List  Diagnosis  . Right acetabular fracture  . Open dislocation of right elbow  . Distal radius fracture, right  . Fracture of iliac wing  . Pubic ramus fracture  . Fall from tree  . Hypercholesteremia  . Nephrolithiasis  . Hypertension  . Back pain  . Obesity  . BPH (benign prostatic hypertrophy)  . Gastritis  . Reflux  . Diabetes mellitus without complication  . Acute blood loss anemia  . Hypokalemia  . Acute respiratory failure following trauma and surgery  . Acute kidney injury  . Hypernatremia  . Aspiration pneumonia  . Ileus  . Brain injury without skull fracture    Past Medical History:  Past Medical History  Diagnosis Date  . Bee sting allergy   . Hypercholesteremia   . Nephrolithiasis   . Herniated disc     c7-c7, c5-6, c3-4, c4-5  . Hypertension   . Back pain   . Obesity   . BPH (benign prostatic hypertrophy)   . Gastritis   . Reflux   . Diabetes mellitus without complication    Past Surgical History:  Past Surgical History  Procedure Laterality Date  . Inguinal hernia repair      right  . Kidney stones      x2 Dr. Earlene Plater   . Orif r elbow  07/12/2012  . Orif r wirst  07/12/2012  . Open reduction internal fixation (orif) distal radial fracture Right 07/11/2012    Procedure: OPEN REDUCTION INTERNAL FIXATION (ORIF) DISTAL RADIAL FRACTURE;  Surgeon: Marlowe Shores, MD;  Location: [facility] OR;  Service: Orthopedics;  Laterality: Right;  . Orif radial fracture Right 07/11/2012    Procedure: OPEN REDUCTION INTERNAL FIXATION (ORIF) RADIAL head FRACTURE;  Surgeon: Marlowe Shores, MD;  Location: [facility] OR;   Service: Orthopedics;  Laterality: Right;  . Orif acetabular fracture Right 07/14/2012    Procedure: OPEN REDUCTION INTERNAL FIXATION (ORIF) ACETABULAR FRACTURE;  Surgeon: Budd Palmer, MD;  Location: [facility] OR;  Service: Orthopedics;  Laterality: Right;    Assessment & Plan Clinical Impression: Patient is a 62 y.o. year old male with recent admission to the hospital with history of DM, DDD, who sustained a fall of about 15 ft when he fell out of a tree on 07/11/2012. Pt was using a chainsaw, cutting branches from a tree when he fell off a ladder, landing on his R side. Pt had immediate onset of pain in R arm and R hip along with the inability to bear weight. Work up in ED revealed open right elbow dislocation, right distal radius fracture, displaced right iliac fracture extending to superior and medial acetabular walls and non displaced Right inferior pubic rami fracture. Pt was taken to the OR for I&D with ORIF of his R elbow along with a R radial head replacement and ORIF of the R wrist. He is NWB RUE. While in the OR Dr. Shon Baton placed a skeletal traction pin to help prevent further displacement and protrusion of the femoral head into the the R acetabulum.   Dr. Carola Frost was consulted for input and patient underwent ORIF right acetabulum on 03/04 and is NWB on RLE Was  extubated without difficulty on 03/06. Post op has had problems with ileus and urinary retention. CT abdomen with Mild BPH and non obstructing stones left kidney. Dr. Briant Cedar consulted as patient developed on acute renal failure. He felt that patient with ATN from contrast as well as Hypotension in face of ACE and ARB. Medications adjusted with recommendations for close monitoring of I/O and IVF for hydration. Creatinine plateau ed at 4.3-4.5 range. Metabolic acidosis being treated with NaHCO3 IVF and nephrology will continue to monitor for recovery. Right elbow dressing changed and patient placed in posterior splint on 03/12 with recommendations  to begin gentle ROM.  Ileus resolving and advanced to regular diet. Has been voiding without difficulty per reports. Has been on zosyn for leucocytosis and empiric treatment for aspiration. Therapies ongoing. Patient and wife educated on splint care and management. Follow up elbow films today pending. Patient transferred to CIR on 07/23/2012 .   Patient currently requires mod with mobility secondary to muscle weakness, muscle joint tightness and WB restrictions, decreased cardiorespiratoy endurance and decreased sitting balance, decreased standing balance, decreased balance strategies and difficulty maintaining precautions.  Prior to hospitalization, patient was independent  with mobility and lived with Spouse in a House home.  Home access is Patient states they are currently building a ramped entrance Stairs to enter;Ramped entrance.  Patient will benefit from skilled PT intervention to maximize safe functional mobility, minimize fall risk and decrease caregiver burden for planned discharge home with 24 hour supervision.  Anticipate patient will benefit from follow up HH at discharge.  PT - End of Session Endurance Deficit: Yes PT Assessment Rehab Potential: Good Barriers to Discharge: None (ramp already in the process of being built for home entry) PT Plan PT Intensity: Minimum of 1-2 x/day ,45 to 90 minutes PT Frequency: 5 out of 7 days PT Duration Estimated Length of Stay: 10-14 days PT Treatment/Interventions: Ambulation/gait training;Balance/vestibular training;Community reintegration;Discharge planning;DME/adaptive equipment instruction;Functional mobility training;Neuromuscular re-education;Pain management;Patient/family education;Psychosocial support;Skin care/wound management;Splinting/orthotics;Therapeutic Activities;Therapeutic Exercise;UE/LE Strength taining/ROM;UE/LE Coordination activities;Wheelchair propulsion/positioning PT Recommendation Follow Up Recommendations: Home health  PT Patient destination: Home Equipment Recommended: Wheelchair (measurements);Wheelchair cushion (measurements);Sliding board (20x18 with elevating leg rests)  Skilled Therapeutic Intervention Treatment focused on transfer training using slideboard with education on postioning and placement of board and need for anterior weightshift, education on w/c parts management and w/c mobility (recommended using shoe on LLE to aid with w/c propulsion due to inability to use RUE and RLE), and oriented to rehab unit. Set up pt at sink at end of session to complete ADL tasks.   PT Evaluation Precautions/Restrictions Precautions Precautions: Fall Restrictions Weight Bearing Restrictions: Yes RUE Weight Bearing: Non weight bearing RLE Weight Bearing: Non weight bearing Other Position/Activity Restrictions: Gentle PROM Rt. elbow and wrist per Montez Morita, PA order 07/22/12; in chart prior to CIR eval Pain Reports pain present in RUE and RLE; pt reports trying to limit pain medication intake due to it makes him feel "funny"  Home Living/Prior Functioning Home Living Lives With: Spouse Available Help at Discharge: Family;Available 24 hours/day Type of Home: House Home Access: Stairs to enter;Ramped entrance Entrance Stairs-Number of Steps: Patient states they are currently building a ramped entrance  Home Layout: One level Bathroom Shower/Tub: Tub/shower unit;Curtain;Walk-in shower Bathroom Toilet: Handicapped height How Accessible: Accessible via wheelchair Home Adaptive Equipment: Bedside commode/3-in-1 Prior Function Level of Independence: Independent with basic ADLs;Independent with gait;Independent with transfers Able to Take Stairs?: Yes Driving: Yes Vision/Perception  Vision - History Baseline Vision: Wears glasses all  the time (hasn't worn them since his hospital stay) Patient Visual Report: No change from baseline Vision - Assessment Eye Alignment: Within Functional  Limits Perception Perception: Within Functional Limits Praxis Praxis: Intact  Cognition Overall Cognitive Status: Appears within functional limits for tasks assessed Arousal/Alertness: Awake/alert Orientation Level: Oriented X4 Memory: Appears intact Awareness: Appears intact Problem Solving: Appears intact Safety/Judgment: Appears intact Sensation Sensation Light Touch:  (LE's appear intact) Additional Comments: Patient reports some tingling in right hand/fingers, LUE appear intact Coordination Gross Motor Movements are Fluid and Coordinated: No Fine Motor Movements are Fluid and Coordinated: No Coordination and Movement Description: Left hand/UE is WFL, right hand/UE has decreased gross and fine motor movements & increased swelling. Right UE is in cast and unable to fully assess Motor  Motor Motor:  (RUE and RLE WB restrictions limiting; RUE edema/splint)    Locomotion  Ambulation Ambulation/Gait Assistance: Not tested (comment) (unsafe at eval due to NWB RLE and RUE)  Trunk/Postural Assessment  Cervical Assessment Cervical Assessment: Within Functional Limits Thoracic Assessment Thoracic Assessment: Exceptions to [facility] (decreased rotation; body habitus limiting full assessment) Lumbar Assessment Lumbar Assessment: Exceptions to [facility] (posterior tilt in sitting; body habitus limiting assessment)  Balance Static Sitting Balance Static Sitting - Level of Assistance: 5: Stand by assistance Dynamic Sitting Balance Dynamic Sitting - Level of Assistance: 5: Stand by assistance Extremity Assessment  RUE Assessment RUE Assessment: Exceptions to [facility] RUE AROM (degrees) RUE Overall AROM Comments: RUE in cast/aplint and unable to fully assess. Patient unable to actively flex shoulder and when passivly ranged patient with complaints of pain in elbow and wrist. LUE Assessment LUE Assessment: Within Functional Limits RLE Assessment RLE Assessment: Exceptions to [facility] RLE Strength RLE  Overall Strength Comments: NWB RLE; grossly observed 3-/5 at hip; 3+ to 4-/5 at knee and ankle LLE Assessment LLE Assessment: Within Functional Limits (decreased muscular endurance)  FIM:  FIM - Banker Devices: Sliding board;Arm rests Bed/Chair Transfer: 3: Bed > Chair or W/C: Mod A (lift or lower assist);3: Chair or W/C > Bed: Mod A (lift or lower assist) FIM - Locomotion: Wheelchair Locomotion: Wheelchair: 2: Travels 50 - 149 ft with moderate assistance (Pt: 50 - 74%) FIM - Locomotion: Ambulation Ambulation/Gait Assistance: Not tested (comment) (unsafe at eval due to NWB RLE and RUE) FIM - Locomotion: Stairs Locomotion: Stairs: 0: Activity did not occur (unsafe at eval due to NWB RLE and RUE)   Refer to Care Plan for Long Term Goals  Recommendations for other services: None  Discharge Criteria: Patient will be discharged from PT if patient refuses treatment 3 consecutive times without medical reason, if treatment goals not met, if there is a change in medical status, if patient makes no progress towards goals or if patient is discharged from hospital.  The above assessment, treatment plan, treatment alternatives and goals were discussed and mutually agreed upon: by patient and by family  Tedd Sias 07/24/2012, 10:46 AM

## 2012-07-24 NOTE — Progress Notes (Signed)
*  PRELIMINARY RESULTS* Vascular Ultrasound Lower extremity venous duplex has been completed.  Preliminary findings:  Bilateral:  No evidence of DVT, superficial thrombosis, or Baker's Cyst.    Farrel Demark, RDMS, RVT  07/24/2012, 1:34 PM

## 2012-07-24 NOTE — Progress Notes (Signed)
Patient information reviewed and entered into eRehab system by Marie Noel, RN, CRRN, PPS Coordinator.  Information including medical coding and functional independence measure will be reviewed and updated through discharge.    

## 2012-07-24 NOTE — Progress Notes (Signed)
ANTICOAGULATION CONSULT NOTE - Follow Up Consult  Pharmacy Consult for coumadin Indication: VTE prophylaxis  No Known Allergies  Patient Measurements: Height: 5\' 9"  (175.3 cm) Weight: 251 lb 5.2 oz (114 kg) IBW/kg (Calculated) : 70.7 Heparin Dosing Weight:   Vital Signs: Temp: 99.3 F (37.4 C) (03/14 0516) BP: 121/71 mmHg (03/14 0516) Pulse Rate: 89 (03/14 0516)  Labs:  Recent Labs  07/22/12 0545 07/23/12 0545 07/24/12 0700 07/24/12 1249  HGB 8.2* 8.0* 8.3*  --   HCT 23.8* 23.4* 24.3*  --   PLT 253 263 301  --   LABPROT 23.8* 28.2*  --  21.9*  INR 2.24* 2.82*  --  2.00*  CREATININE 4.40* 4.33* 3.92*  --     Estimated Creatinine Clearance: 24.3 ml/min (by C-G formula based on Cr of 3.92).   Medications:  Scheduled:  . glimepiride  2 mg Oral Q breakfast  . [START ON 07/25/2012] influenza  inactive virus vaccine  0.5 mL Intramuscular Tomorrow-1000  . insulin aspart  0-15 Units Subcutaneous TID WC  . insulin aspart  0-5 Units Subcutaneous QHS  . insulin glargine  10 Units Subcutaneous Daily  . pantoprazole  40 mg Oral Daily  . [START ON 07/25/2012] pneumococcal 23 valent vaccine  0.5 mL Intramuscular Tomorrow-1000  . polyethylene glycol  17 g Oral Daily  . potassium chloride  40 mEq Oral BID  . tamsulosin  0.4 mg Oral Daily  . [COMPLETED] warfarin  1 mg Oral ONCE-1800  . warfarin  5 mg Oral ONCE-1800  . [COMPLETED] warfarin   Does not apply Once  . Warfarin - Pharmacist Dosing Inpatient   Does not apply q1800   Infusions:  . [EXPIRED]  sodium bicarbonate  infusion 1000 mL Stopped (07/24/12 0300)  .  sodium bicarbonate  infusion 1000 mL 200 mL/hr at 07/24/12 1000    Assessment: 62 yo male s/p ortho surgery is currently on therapeutic coumadin; however, INR has come down from 2.82 to 2 today after getting 1 mg of coumadin last night.   Goal of Therapy:  INR 2-3    Plan:  1) Coumadin 5mg  po x1 (Patient probably will require 2.5 to 5mg  of coumadin daily) 2)  F/u INR in am.  So, Tsz-Yin 07/24/2012,1:32 PM

## 2012-07-24 NOTE — Progress Notes (Signed)
Subjective/Complaints: Restless night. Couldn't get comfortable. Pain under fair control. Feels as if "elbow" is floating out of joint. A 12 point review of systems has been performed and if not noted above is otherwise negative.   Objective: Vital Signs: Blood pressure 121/71, pulse 89, temperature 99.3 F (37.4 C), temperature source Oral, resp. rate 18, weight 114 kg (251 lb 5.2 oz), SpO2 92.00%. Dg Elbow 2 Views Right  07/24/2012  *RADIOLOGY REPORT*  Clinical Data: Subluxation  RIGHT ELBOW - 2 VIEW  Comparison: Single lateral view in splint compared to previous study of 07/23/2012  Findings: Radial head prosthesis identified. Mild posterior subluxation of the ulna at the elbow joint is seen with widening of the joint space. No ulnar dislocation is identified. No fracture or bone destruction seen.  IMPRESSION: Mild posterior subluxation of the ulna at the elbow joint with minimal joint space widening. No evidence of ulnar dislocation. Again under radial head prosthesis.   Original Report Authenticated By: Ulyses Southward, M.D.    Dg Elbow 2 Views Right  07/23/2012  *RADIOLOGY REPORT*  Clinical Data:  Previous surgery to the right radius.  RIGHT ELBOW - 2 VIEW  Comparison: 07/23/2012  Findings: Two views of the right elbow were obtained.  Again noted is replacement of the radial head. The elbow appears to be dislocated posteriorly on the lateral view.  Again seen are small bone fragments along the anterior aspect of the distal humerus. There is some irregularity along the ulnar articulating surface. Evidence for soft tissue swelling.  IMPRESSION: Posterior dislocation of the right elbow.  These results were called by telephone on 07/23/2012 at 5:18 p.m. to Montez Morita, PA-C,, who verbally acknowledged these results.   Original Report Authenticated By: Richarda Overlie, M.D.    Dg Forearm Right  07/23/2012  *RADIOLOGY REPORT*  Clinical Data: Postop fracture fixation  RIGHT FOREARM - 2 VIEW  Comparison:  07/11/2012  Findings: Comminuted fracture distal radius has been fixed with a ventral plate and screws.  The fracture extends into the wrist joint.  The alignment appears satisfactory however the lateral view is obliqued.  There  has been resection of the radial head and placement of a prosthesis.  There are several small bone fragments anterior to the elbow joint which is satisfactorily located following prior dislocation.  IMPRESSION: Postsurgical changes as above.   Original Report Authenticated By: Janeece Riggers, M.D.     Recent Labs  07/23/12 0545 07/24/12 0700  WBC 16.0* 16.1*  HGB 8.0* 8.3*  HCT 23.4* 24.3*  PLT 263 301    Recent Labs  07/23/12 0545 07/24/12 0700  NA 146* 147*  K 4.0 4.1  CL 114* 112  GLUCOSE 198* 168*  BUN 46* 41*  CREATININE 4.33* 3.92*  CALCIUM 8.3* 8.3*   CBG (last 3)   Recent Labs  07/23/12 1149 07/23/12 2118 07/24/12 0714  GLUCAP 180* 191* 155*    Wt Readings from Last 3 Encounters:  07/24/12 114 kg (251 lb 5.2 oz)  07/12/12 102.059 kg (225 lb)  07/12/12 102.059 kg (225 lb)    Physical Exam:  Constitutional: He is oriented to person, place, and time. He appears well-developed and well-nourished.  Flat affect, appears encephalopathic and fluid overloaded.  HENT:  Head: Normocephalic and atraumatic.  Eyes: Pupils are equal, round, and reactive to light.  Neck: Normal range of motion.  Cardiovascular: Normal rate and regular rhythm.  Pulmonary/Chest: Effort normal and breath sounds normal.  Abdominal: He exhibits distension. There is no tenderness.  Decreased BS. Abdominal and perineum area edematous.  Genitourinary:  3+ scrotal edema Musculoskeletal: He exhibits edema.  RUE-1+ edema at hand and dependently at elbow.right arm dressed. Arm not seated in splint and is at a 120 degree angle, tender with touch, rom. RLE- Incision right groin and With sutures in place--clean and dry without erythema. Abraded lacerated area right lateral thigh  with moderate amount of serous drainage. 2+ pitting edema RLE. 1+ edema LLE.  Neurological: He is alert and oriented to person, place, and time.  Speech measured. Follows basic commands without difficulty. Mild delay noted. Muscle spasms RUE when out of brace.  Skin: Skin is warm and dry.  Psychiatric: Thought content normal. His affect is blunt. He is slowed. Cognition and memory are fair. Reasonable insight and awareness In motor strength is 5/5 in the left deltoid, biceps, grip left lower extremity 4 minus at the hip flexor knee extensor ankle dorsiflexor plantar flexor  Right lower extremity 3 minus hip flexor knee extensor 4 minus ankle dorsiflexor plantar flexor  Right upper extremity is in splint,     Assessment/Plan: 1. Functional deficits secondary to complex right hemipelvis fx, right elbow dislocation with distal radius fx, mild TBI which require 3+ hours per day of interdisciplinary therapy in a comprehensive inpatient rehab setting. Physiatrist is providing close team supervision and 24 hour management of active medical problems listed below. Physiatrist and rehab team continue to assess barriers to discharge/monitor patient progress toward functional and medical goals. FIM:                                  Medical Problem List and Plan:  1. DVT Prophylaxis/Anticoagulation: Pharmaceutical: Coumadin. Lovenox discontinued as INR therapeutic.  2. Pain Management: Will continue prn ultram.  3. Mood: Seems to be flat  4. Neuropsych: This patient is capable of making decisions on his/her own behalf.  5. Right anterior column/anterior wall acetabular Fx: No formal hip precautions. NWB if unable to TDWB RLE.  6. Right distal humerus and Right distal radius fractue: NWB.Will require relocation and splinting of elbow dislocation, orthopedics is aware, and will follow up.  will keep in splint nonweightbearing right upper extremity may do range of motion at shoulder 7.  Leucocytosis: Antibiotics discontinued. Will monitor for temperature curve for fevers. Continue to monitor wounds. WBC up to 16.0. Will check UA. Most recent CXR with clearing of infiltrates.  8. Post op ileus: Continue reglan. Added miralax to help with bowel program. Suppository prn no BM in 24 hours.  9. Acute renal failure/Metabolic acidosis: s/p bicarb drip. Renal to follow for assistance.  10. DM Type 2: Will continue to monitor with AC/HS cbg checks. Hgb A1C @6 .5. Elevated BS likely due to stress. SSI for elevated BS and titrate medications as indicated. Off metformin due to renal failure. added Amaryl and taper off lantus.  11. GU: Incontinent of urine due to IVF and well as scrotal edema/penis retraction.  check PVRs.   UA/UCS pending   LOS (Days) 1 A FACE TO FACE EVALUATION WAS PERFORMED  SWARTZ,ZACHARY T 07/24/2012 8:28 AM

## 2012-07-25 ENCOUNTER — Inpatient Hospital Stay (HOSPITAL_COMMUNITY): Payer: 59 | Admitting: Physical Therapy

## 2012-07-25 ENCOUNTER — Inpatient Hospital Stay (HOSPITAL_COMMUNITY): Payer: 59 | Admitting: Speech Pathology

## 2012-07-25 ENCOUNTER — Inpatient Hospital Stay (HOSPITAL_COMMUNITY): Payer: 59 | Admitting: Occupational Therapy

## 2012-07-25 LAB — PROTIME-INR
INR: 2.32 — ABNORMAL HIGH (ref 0.00–1.49)
Prothrombin Time: 24.4 seconds — ABNORMAL HIGH (ref 11.6–15.2)

## 2012-07-25 LAB — FERRITIN: Ferritin: 243 ng/mL (ref 22–322)

## 2012-07-25 LAB — GLUCOSE, CAPILLARY
Glucose-Capillary: 118 mg/dL — ABNORMAL HIGH (ref 70–99)
Glucose-Capillary: 67 mg/dL — ABNORMAL LOW (ref 70–99)
Glucose-Capillary: 84 mg/dL (ref 70–99)
Glucose-Capillary: 77 mg/dL (ref 70–99)

## 2012-07-25 LAB — RENAL FUNCTION PANEL
Albumin: 2.4 g/dL — ABNORMAL LOW (ref 3.5–5.2)
BUN: 40 mg/dL — ABNORMAL HIGH (ref 6–23)
Chloride: 109 mEq/L (ref 96–112)
GFR calc Af Amer: 18 mL/min — ABNORMAL LOW (ref >90)
GFR calc non Af Amer: 16 mL/min — ABNORMAL LOW (ref >90)
Phosphorus: 4.8 mg/dL — ABNORMAL HIGH (ref 2.3–4.6)
Potassium: 4.4 mEq/L (ref 3.5–5.1)
Sodium: 143 mEq/L (ref 135–145)

## 2012-07-25 LAB — IRON AND TIBC
Iron: 35 ug/dL — ABNORMAL LOW (ref 42–135)
Saturation Ratios: 15 % — ABNORMAL LOW (ref 20–55)
TIBC: 231 ug/dL (ref 215–435)
UIBC: 196 ug/dL (ref 125–400)

## 2012-07-25 MED ORDER — DARBEPOETIN ALFA-POLYSORBATE 100 MCG/0.5ML IJ SOLN
100.0000 ug | INTRAMUSCULAR | Status: DC
  Administered 2012-07-25 – 2012-08-01 (×2): 100 ug via SUBCUTANEOUS
  Filled 2012-07-25 (×3): qty 0.5

## 2012-07-25 MED ORDER — WARFARIN SODIUM 5 MG PO TABS
5.0000 mg | ORAL_TABLET | Freq: Once | ORAL | Status: AC
  Administered 2012-07-25: 5 mg via ORAL
  Filled 2012-07-25: qty 1

## 2012-07-25 NOTE — Progress Notes (Signed)
Hypoglycemic Event  CBG: 55  Treatment: 15 GM carbohydrate snack  Symptoms: None  Follow-up CBG: Time:1718 CBG Result:67  Possible Reasons for Event: Medication regimen: medication regimen  Comments/MD notified:Dr. Noah Charon, Kelly Splinter  Remember to initiate Hypoglycemia Order Set & complete

## 2012-07-25 NOTE — Progress Notes (Signed)
Patient ID: Roger Palmer, male   DOB: 06-Jul-1950, 62 y.o.   MRN: [id number]    [facility] Progress Note    Subjective:   Reports to be feeling better, pain is tolerable and he is participating in rehabilitation without problems.    Objective:   BP 130/82  Pulse 83  Temp(Src) 98.1 F (36.7 C) (Oral)  Resp 18  Ht 5\' 9"  (1.753 m)  Wt 114 kg (251 lb 5.2 oz)  BMI 37.1 kg/m2  SpO2 94%  Intake/Output Summary (Last 24 hours) at 07/25/12 0947 Last data filed at 07/25/12 0600  Gross per 24 hour  Intake    240 ml  Output   2340 ml  Net  -2100 ml   Weight change:   Physical Exam: Gen: Comfortably sitting up in bed, watching television CVS: Pulse regular in rate and rhythm, heart sounds S1 and S2 normal Resp: Clear to auscultation bilaterally, no rales/rhonchi Abd: Soft, obese, nontender, bowel sounds are normal Ext: Trace lower extremity edema  Imaging: Dg Elbow 2 Views Right  07/24/2012  *RADIOLOGY REPORT*  Clinical Data: Elbow subluxation.  RIGHT ELBOW - 2 VIEW  Comparison: 07/23/2012  Findings: Radial head prosthesis remains in appropriate position. No evidence of hardware failure or loosening.  No evidence of fracture.  There has been interval reduction of the previously seen ulnar subluxation.  IMPRESSION: Interval reduction of previously seen ulnar subluxation.  No evidence of fracture or dislocation.   Original Report Authenticated By: Myles Rosenthal, M.D.    Dg Elbow 2 Views Right  07/24/2012  *RADIOLOGY REPORT*  Clinical Data: Subluxation  RIGHT ELBOW - 2 VIEW  Comparison: Single lateral view in splint compared to previous study of 07/23/2012  Findings: Radial head prosthesis identified. Mild posterior subluxation of the ulna at the elbow joint is seen with widening of the joint space. No ulnar dislocation is identified. No fracture or bone destruction seen.  IMPRESSION: Mild posterior subluxation of the ulna at the elbow joint with minimal joint space widening. No  evidence of ulnar dislocation. Again under radial head prosthesis.   Original Report Authenticated By: Ulyses Southward, M.D.    Dg Elbow 2 Views Right  07/23/2012  *RADIOLOGY REPORT*  Clinical Data:  Previous surgery to the right radius.  RIGHT ELBOW - 2 VIEW  Comparison: 07/23/2012  Findings: Two views of the right elbow were obtained.  Again noted is replacement of the radial head. The elbow appears to be dislocated posteriorly on the lateral view.  Again seen are small bone fragments along the anterior aspect of the distal humerus. There is some irregularity along the ulnar articulating surface. Evidence for soft tissue swelling.  IMPRESSION: Posterior dislocation of the right elbow.  These results were called by telephone on 07/23/2012 at 5:18 p.m. to Montez Morita, PA-C,, who verbally acknowledged these results.   Original Report Authenticated By: Richarda Overlie, M.D.    Dg Forearm Right  07/23/2012  *RADIOLOGY REPORT*  Clinical Data: Postop fracture fixation  RIGHT FOREARM - 2 VIEW  Comparison: 07/11/2012  Findings: Comminuted fracture distal radius has been fixed with a ventral plate and screws.  The fracture extends into the wrist joint.  The alignment appears satisfactory however the lateral view is obliqued.  There  has been resection of the radial head and placement of a prosthesis.  There are several small bone fragments anterior to the elbow joint which is satisfactorily located following prior dislocation.  IMPRESSION: Postsurgical changes as above.   Original Report Authenticated  By: Janeece Riggers, M.D.     Labs: BMET  Recent Labs Lab 07/19/12 8657 07/20/12 0745 07/21/12 8469 07/21/12 0804 07/22/12 0545 07/23/12 0545 07/24/12 0700 07/25/12 0610  NA 149* 147* 145 144 145 146* 147* 143  K 3.4* 4.2 3.8 3.9 3.9 4.0 4.1 4.4  CL 115* 115* 116* 114* 116* 114* 112 109  CO2 19 18* 19 17* 17* 18* 24 23  GLUCOSE 195* 230* 267* 265* 220* 198* 168* 150*  BUN 57* 58* 56* 54* 52* 46* 41* 40*  CREATININE  4.56* 4.44* 4.50* 4.35* 4.40* 4.33* 3.92* 3.80*  CALCIUM 8.3* 8.4 8.1* 8.2* 8.2* 8.3* 8.3* 8.4  PHOS 2.1*  --   --   --   --   --  4.7* 4.8*   CBC  Recent Labs Lab 07/20/12 1120 07/21/12 0804 07/22/12 0545 07/23/12 0545 07/24/12 0700  WBC 16.6* 15.9* 16.3* 16.0* 16.1*  NEUTROABS 13.1*  --   --   --   --   HGB 8.4* 8.1* 8.2* 8.0* 8.3*  HCT 24.2* 23.9* 23.8* 23.4* 24.3*  MCV 87.1 91.2 89.1 89.3 89.0  PLT 269 240 253 263 301    Medications:    . glimepiride  2 mg Oral Q breakfast  . influenza  inactive virus vaccine  0.5 mL Intramuscular Tomorrow-1000  . insulin aspart  0-15 Units Subcutaneous TID WC  . insulin aspart  0-5 Units Subcutaneous QHS  . insulin glargine  10 Units Subcutaneous Daily  . pantoprazole  40 mg Oral Daily  . pneumococcal 23 valent vaccine  0.5 mL Intramuscular Tomorrow-1000  . polyethylene glycol  17 g Oral Daily  . potassium chloride  40 mEq Oral BID  . tamsulosin  0.4 mg Oral Daily  . warfarin  5 mg Oral ONCE-1800  . Warfarin - Pharmacist Dosing Inpatient   Does not apply q1800     Assessment/ Plan:   1. Acute renal failure: Multifactorial-primarily contrast-induced nephropathy and ATN with hypotension and ongoing ARB/ACE inhibitor in this diabetic patient. Renal function improving slowly and urine output is excellent. Will discontinue intravenous fluids given improvement of electrolyte status and allow oral intake. No acute dialysis needs, no acute electrolyte concerns. We'll discontinue scheduled potassium to avoid overshooting hyperkalemia. 2. Multiple osseous injury status post fall: Ongoing evaluation and management per physical medicine and rehabilitation service. Status post open reduction internal fixation.  3. Anemia: Followup iron stores and consider ESA therapy. 4. Leukocytosis: Afebrile and suspected to be primarily the margination status post fall/fractures. Continue to monitor for features that would prompt pancultures.  Zetta Bills,  MD 07/25/2012, 9:47 AM

## 2012-07-25 NOTE — Progress Notes (Signed)
Serous drainage noted in bed. Oozing noted from right thigh, old JP drain site. Cleaned area-applied allevyn dressing. Noted macerated area with open area to left buttock-allevyn dressing applied. Large amount of scrotal edema observed. Edema all over, pitting to BLE's. Sutures in place to lower abd. Requiring assistance with placing, holding, and emptying urinal. Abdomen distended with hyper bowel sounds. LBM 07/23/12.  (+) CMS to right hand/fingers. Without complaint of pain. Roger Palmer

## 2012-07-25 NOTE — Progress Notes (Signed)
Occupational Therapy Session Note  Patient Details  Name: Roger Palmer MRN: [id number] Date of Birth: 03/26/1951  Today's Date: 07/25/2012 Time: 1045-12 : Time calcuation=75 minutes  Skilled Therapeutic Interventions/Progress Updates: Focus of session wason going from sit to stand (Max A x2) to complete self care tasks while adhering to R UE and LE precautions - especially for standing to complete pericleansing and pulling up pants.     Patient required extra time to complete session in order to work without precaution level and to complete the steps of the self care task     Therapy Documentation Precautions:  Precautions Precautions: Fall Restrictions Weight Bearing Restrictions: Yes RUE Weight Bearing: Non weight bearing RLE Weight Bearing: Non weight bearing Other Position/Activity Restrictions: Gentle PROM Rt. elbow and wrist per Montez Morita, PA order 07/22/12; in chart prior to CIR eval Pain:denied   See FIM for current functional status  Therapy/Group: Individual Therapy  Bud Face [facility] 07/25/2012, 4:06 PM

## 2012-07-25 NOTE — Progress Notes (Signed)
ANTICOAGULATION CONSULT NOTE - Follow Up Consult  Pharmacy Consult for coumadin Indication: VTE prophylaxis  No Known Allergies  Patient Measurements: Height: 5\' 9"  (175.3 cm) Weight: 251 lb 5.2 oz (114 kg) IBW/kg (Calculated) : 70.7   Vital Signs: Temp: 98.1 F (36.7 C) (03/15 0500) Temp src: Oral (03/15 0500) BP: 130/82 mmHg (03/15 0500) Pulse Rate: 83 (03/15 0500)  Labs:  Recent Labs  07/23/12 0545 07/24/12 0700 07/24/12 1249 07/25/12 0610  HGB 8.0* 8.3*  --   --   HCT 23.4* 24.3*  --   --   PLT 263 301  --   --   LABPROT 28.2*  --  21.9* 24.4*  INR 2.82*  --  2.00* 2.32*  CREATININE 4.33* 3.92*  --  3.80*    Estimated Creatinine Clearance: 25.1 ml/min (by C-G formula based on Cr of 3.8).  Assessment: 62 yo male s/p ortho surgery is currently on therapeutic coumadin. INR. remains therapeutic with an INR of 2.32. No bleeding noted.   Goal of Therapy:  INR 2-3   Plan:  1. Repeat coumadin 5mg  PO x 1 tonight - may eventually require alternating dose of 2.5 and 5mg  2. F/u AM INR  Lysle Pearl, PharmD, BCPS Pager # [phone] 07/25/2012 9:06 AM

## 2012-07-25 NOTE — Progress Notes (Signed)
Roger Palmer is a 62 y.o. male [birth year] [id number]  Subjective: No new complaints. No new problems. Slept well. Feeling OK.  Objective: Vital signs in last 24 hours: Temp:  [97.6 F (36.4 C)-98.1 F (36.7 C)] 98.1 F (36.7 C) (03/15 0500) Pulse Rate:  [83-98] 83 (03/15 0500) Resp:  [18-19] 18 (03/15 0500) BP: (130-138)/(76-82) 130/82 mmHg (03/15 0500) SpO2:  [94 %-98 %] 94 % (03/15 0500) Weight change:  Last BM Date: 07/23/12  Intake/Output from previous day: 03/14 0701 - 03/15 0700 In: 240 [P.O.:240] Out: 2715 [Urine:2715]  Physical Exam General: No apparent distress    Lungs: Normal effort. Lungs clear to auscultation, no crackles or wheezes. Cardiovascular: Regular rate and rhythm, no edema Musculoskeletal:  Neurovascularly intact Neurological: No new neurological deficits Wounds:  Clean, dry, intact. No signs of infection.  Lab Results: BMET    Component Value Date/Time   NA 143 07/25/2012 0610   K 4.4 07/25/2012 0610   CL 109 07/25/2012 0610   CO2 23 07/25/2012 0610   GLUCOSE 150* 07/25/2012 0610   BUN 40* 07/25/2012 0610   CREATININE 3.80* 07/25/2012 0610   CALCIUM 8.4 07/25/2012 0610   GFRNONAA 16* 07/25/2012 0610   GFRAA 18* 07/25/2012 0610   CBC    Component Value Date/Time   WBC 16.1* 07/24/2012 0700   RBC 2.73* 07/24/2012 0700   HGB 8.3* 07/24/2012 0700   HCT 24.3* 07/24/2012 0700   PLT 301 07/24/2012 0700   MCV 89.0 07/24/2012 0700   MCH 30.4 07/24/2012 0700   MCHC 34.2 07/24/2012 0700   RDW 15.5 07/24/2012 0700   LYMPHSABS 1.3 07/20/2012 1120   MONOABS 1.8* 07/20/2012 1120   EOSABS 0.4 07/20/2012 1120   BASOSABS 0.1 07/20/2012 1120   CBG's (last 3):   Recent Labs  07/24/12 1758 07/24/12 2106 07/25/12 0742  GLUCAP 74 124* 118*   LFT's Lab Results  Component Value Date   ALT 17 07/24/2012   AST 15 07/24/2012   ALKPHOS 53 07/24/2012   BILITOT 0.5 07/24/2012    Studies/Results: Dg Elbow 2 Views Right  07/24/2012  *RADIOLOGY REPORT*  Clinical Data:  Elbow subluxation.  RIGHT ELBOW - 2 VIEW  Comparison: 07/23/2012  Findings: Radial head prosthesis remains in appropriate position. No evidence of hardware failure or loosening.  No evidence of fracture.  There has been interval reduction of the previously seen ulnar subluxation.  IMPRESSION: Interval reduction of previously seen ulnar subluxation.  No evidence of fracture or dislocation.   Original Report Authenticated By: Myles Rosenthal, M.D.    Dg Elbow 2 Views Right  07/24/2012  *RADIOLOGY REPORT*  Clinical Data: Subluxation  RIGHT ELBOW - 2 VIEW  Comparison: Single lateral view in splint compared to previous study of 07/23/2012  Findings: Radial head prosthesis identified. Mild posterior subluxation of the ulna at the elbow joint is seen with widening of the joint space. No ulnar dislocation is identified. No fracture or bone destruction seen.  IMPRESSION: Mild posterior subluxation of the ulna at the elbow joint with minimal joint space widening. No evidence of ulnar dislocation. Again under radial head prosthesis.   Original Report Authenticated By: Ulyses Southward, M.D.    Dg Elbow 2 Views Right  07/23/2012  *RADIOLOGY REPORT*  Clinical Data:  Previous surgery to the right radius.  RIGHT ELBOW - 2 VIEW  Comparison: 07/23/2012  Findings: Two views of the right elbow were obtained.  Again noted is replacement of the radial head. The elbow appears to be dislocated  posteriorly on the lateral view.  Again seen are small bone fragments along the anterior aspect of the distal humerus. There is some irregularity along the ulnar articulating surface. Evidence for soft tissue swelling.  IMPRESSION: Posterior dislocation of the right elbow.  These results were called by telephone on 07/23/2012 at 5:18 p.m. to Montez Morita, PA-C,, who verbally acknowledged these results.   Original Report Authenticated By: Richarda Overlie, M.D.    Dg Forearm Right  07/23/2012  *RADIOLOGY REPORT*  Clinical Data: Postop fracture fixation  RIGHT  FOREARM - 2 VIEW  Comparison: 07/11/2012  Findings: Comminuted fracture distal radius has been fixed with a ventral plate and screws.  The fracture extends into the wrist joint.  The alignment appears satisfactory however the lateral view is obliqued.  There  has been resection of the radial head and placement of a prosthesis.  There are several small bone fragments anterior to the elbow joint which is satisfactorily located following prior dislocation.  IMPRESSION: Postsurgical changes as above.   Original Report Authenticated By: Janeece Riggers, M.D.     Medications:  I have reviewed the patient's current medications. Scheduled Medications: . glimepiride  2 mg Oral Q breakfast  . influenza  inactive virus vaccine  0.5 mL Intramuscular Tomorrow-1000  . insulin aspart  0-15 Units Subcutaneous TID WC  . insulin aspart  0-5 Units Subcutaneous QHS  . insulin glargine  10 Units Subcutaneous Daily  . pantoprazole  40 mg Oral Daily  . pneumococcal 23 valent vaccine  0.5 mL Intramuscular Tomorrow-1000  . polyethylene glycol  17 g Oral Daily  . potassium chloride  40 mEq Oral BID  . tamsulosin  0.4 mg Oral Daily  . Warfarin - Pharmacist Dosing Inpatient   Does not apply q1800   PRN Medications: acetaminophen, alum & mag hydroxide-simeth, bisacodyl, diphenhydrAMINE, guaiFENesin-dextromethorphan, methocarbamol, ondansetron (ZOFRAN) IV, ondansetron, traMADol, traZODone  Assessment/Plan: Principal Problem:   Fall from tree Active Problems:   Right acetabular fracture   Open dislocation of right elbow   Diabetes mellitus without complication   Acute blood loss anemia   Hypernatremia   Ileus   Brain injury without skull fracture Functional deficits secondary to Fall resulting in R Complex hemipelvis fracture, Right elbow dislocation, right distal radius fracture, and mild TBI 1. DVT Prophylaxis/Anticoagulation: Pharmaceutical: Coumadin. Lovenox discontinued as INR therapeutic.  2. Pain Management:  Will continue prn ultram.  3. Mood: Seems to be improving  4. Neuropsych: This patient is capable of making decisions on his/her own behalf.  5. Right anterior column/anterior wall acetabular Fx: No formal hip precautions. NWB if unable to TDWB RLE.  6. Right distal humerus and Right distal radius fractue: NWB. S/p relocation and splinting of elbow dislocation 3/14 by orthopedics.  7. Leucocytosis: Antibiotics discontinued. Will monitor for temperature curve for fevers. Continue to monitor wounds. WBC up to 16.0. Most recent CXR with clearing of infiltrates.  8. Post op ileus: Continue reglan. Add miralax to help with bowel program. Suppository prn no BM in 24 hours.  9. Acute renal failure/Metabolic acidosis: s/p bicarb drip, stopped 3/14--prev 200 cc/hr for 15 hours. Renal to follow for assistance. Recheck BMet 3/16 10. DM Type 2: Will continue to monitor with AC/HS cbg checks. Hgb A1C @6 .5. Elevated BS likely due to stress. SSI for elevated BS and titrate medications as indicated. Off metformin due to renal failure. on Amaryl and taper off lantus.  11. GU: Incontinent of urine due to IVF and well as scrotal edema/penis retraction. Will  check PVRs. .  Length of stay, days: 2  Darl Pikes C. Dennison Bulla , PA-C 07/25/2012, 8:52 AM  I have personally taken a history, examined the patient and agree with above.  Valerie A. Felicity Coyer, MD

## 2012-07-25 NOTE — Progress Notes (Signed)
Hypoglycemic Event  CBG:67  Treatment: 15 GM carbohydrate snack  Symptoms: None  Follow-up CBG: Time:1751 CBG Result:84  Possible Reasons for Event: Medication regimen: medication regimen  Comments/MD notified:Dr. Amador Cunas. D/C amaryl 2mg  PO    Lenard Forth Layne  Remember to initiate Hypoglycemia Order Set & complete

## 2012-07-25 NOTE — Evaluation (Signed)
Speech Language Pathology Assessment and Plan  Patient Details  Name: Roger Palmer MRN: [id number] Date of Birth: Sep 15, 1950  SLP Diagnosis: N/A Rehab Potential: N/A ELOS: N/A  Today's Date: 07/25/2012 Time: 4098-1191 Time Calculation (min): 60 min  Problem List:  Patient Active Problem List  Diagnosis  . Right acetabular fracture  . Open dislocation of right elbow  . Distal radius fracture, right  . Fracture of iliac wing  . Pubic ramus fracture  . Fall from tree  . Hypercholesteremia  . Nephrolithiasis  . Hypertension  . Back pain  . Obesity  . BPH (benign prostatic hypertrophy)  . Gastritis  . Reflux  . Diabetes mellitus without complication  . Acute blood loss anemia  . Hypokalemia  . Acute respiratory failure following trauma and surgery  . Acute kidney injury  . Hypernatremia  . Aspiration pneumonia  . Ileus  . Brain injury without skull fracture   Past Medical History:  Past Medical History  Diagnosis Date  . Bee sting allergy   . Hypercholesteremia   . Nephrolithiasis   . Herniated disc     c7-c7, c5-6, c3-4, c4-5  . Hypertension   . Back pain   . Obesity   . BPH (benign prostatic hypertrophy)   . Gastritis   . Reflux   . Diabetes mellitus without complication    Past Surgical History:  Past Surgical History  Procedure Laterality Date  . Inguinal hernia repair      right  . Kidney stones      x2 Dr. Earlene Plater   . Orif r elbow  07/12/2012  . Orif r wirst  07/12/2012  . Open reduction internal fixation (orif) distal radial fracture Right 07/11/2012    Procedure: OPEN REDUCTION INTERNAL FIXATION (ORIF) DISTAL RADIAL FRACTURE;  Surgeon: Marlowe Shores, MD;  Location: [facility] OR;  Service: Orthopedics;  Laterality: Right;  . Orif radial fracture Right 07/11/2012    Procedure: OPEN REDUCTION INTERNAL FIXATION (ORIF) RADIAL head FRACTURE;  Surgeon: Marlowe Shores, MD;  Location: [facility] OR;  Service: Orthopedics;  Laterality: Right;  . Orif acetabular  fracture Right 07/14/2012    Procedure: OPEN REDUCTION INTERNAL FIXATION (ORIF) ACETABULAR FRACTURE;  Surgeon: Budd Palmer, MD;  Location: [facility] OR;  Service: Orthopedics;  Laterality: Right;    Assessment / Plan / Recommendation Clinical Impression  Administered cognitive-linguistic evaluation. Pt reported he has not noticed any change in his cognitive skilled and his overall language and cognitive function are at baseline level of functioning and [facility] for all tasks assessed. Pt does not need skilled SLP intervention at this time. Pt educated on current cognitive function and recommendations and verbalized understanding.     SLP Assessment  Patient does not need any further Speech Lanaguage Pathology Services    Recommendations  Patient destination: Home Follow up Recommendations: None Equipment Recommended: None recommended by SLP    SLP Frequency N/A  SLP Treatment/Interventions N/A   Pain No/Denies Pain  Short Term Goals: N/A  See FIM for current functional status Refer to Care Plan for Long Term Goals  Recommendations for other services: None  Discharge Criteria: Patient will be discharged from SLP if patient refuses treatment 3 consecutive times without medical reason, if treatment goals not met, if there is a change in medical status, if patient makes no progress towards goals or if patient is discharged from hospital.  The above assessment, treatment plan, treatment alternatives and goals were discussed and mutually agreed upon: by patient  Michaelina Blandino,  Brandom Kerwin 07/25/2012, 11:25 AM

## 2012-07-25 NOTE — Progress Notes (Signed)
Physical Therapy Session Note  Patient Details  Name: Roger Palmer MRN: [id number] Date of Birth: Aug 13, 1950  Today's Date: 07/25/2012 Time: Session #1: [id number] Session #2:  [id number] Time Calculation (min): Session #1: 65 min, Session #2: 44 min  Short Term Goals: Week 1:  PT Short Term Goal 1 (Week 1): Pt will be able to complete basic transfers with min A PT Short Term Goal 2 (Week 1): Pt will be able to propel w/c x 100' on unit with S PT Short Term Goal 3 (Week 1): Pt will be able to complete sit to stand with mod A   Skilled Therapeutic Interventions/Progress Updates:    Session #1: This session focused on WC mobility using left arm and leg to propel WC, min assist to get around tight room, but once in hallway supervision.  We figured out that he has an easier time going backwards in the [facility].  >150' x 2 to and from the gym to his room.  Once in the gym worked on transfers into and out of WC to left and right from low to high surfaces with slide board.  Moderate assistance both up and down hill, but almost to minimal assistance to go towards his right side during the transfer.  Pt needing max verbal and tactile cues to keep NWB left leg (at first he kept it bent underneath him-educated him on keeping it kicked out will decrease he reflex to try to push with it during transfer).  Seated on mat table we worked on scooting and initiating a squat to decrease weight on his bottom.  Pt is limited in some of his mobility today due to quite excessive scrotal swelling/discomfort.  Transferred back into the bed going to his right mod assist with slide board.  Sit to supine mod assist to help with bil legs back into bed.  Min assist to get positioned straight with cues for 1/2 bridge and pt using bed rail.   Session #2: This session again, focused on bed mobility getting up out of bed with HOB elevated and railing for support.  Min assist of left leg and to help transition up to sitting to avoid pt  pushing up on his right elbow.  Slide board transfer OOB to right into [facility] mod assist to help stabilize and place board and to help keep NWB right leg. WC mobility to gym >150' with min assist in tight areas to get out of room.  Again, pt went backwards because this is easier than forwards for his left leg muscles.  Sit to stand outside of parallel bars max assist.  Unable to successfully stand first 5 attempts, but then finally got the feel for the pull/push (we tried both pulling on the parallel bar and pushing from the armrest) he was able to stand successfully x5 keeping NWB right leg.  Pt fatigued quickly and had to sit < 45 sec of standing each successful time.  Educated pt this session re: pressure relief while in the bed and in the chair needs to be preformed every 30-45 mins while up if not more frequently (pt was reporting bottom pain and tenderness).    Therapy Documentation Precautions:  Precautions Precautions: Fall Restrictions Weight Bearing Restrictions: Yes RUE Weight Bearing: Non weight bearing RLE Weight Bearing: Non weight bearing Other Position/Activity Restrictions: Gentle PROM Rt. elbow and wrist per Montez Morita, PA order 07/22/12; in chart prior to CIR eval   Pain: Pain Assessment Pain Assessment: 0-10 Pain Score:  2 Pain Type: Acute pain Pain Location: Leg Pain Orientation: Right Pain Radiating Towards: buttocks  Pain Descriptors: Aching;Sore Pain Frequency: Intermittent Pain Onset: With Activity (wanted to be premedicated for therapy) Pain Intervention(s): Repositioned;Ambulation/increased activity (pre medicated.  )   Locomotion : Wheelchair Mobility Distance: 150   See FIM for current functional status  Therapy/Group: Individual Therapy  Lurena Joiner B. Crystallee Werden, PT, DPT [phone]   07/25/2012, 4:08 PM

## 2012-07-26 ENCOUNTER — Inpatient Hospital Stay (HOSPITAL_COMMUNITY): Payer: 59

## 2012-07-26 LAB — BASIC METABOLIC PANEL
BUN: 40 mg/dL — ABNORMAL HIGH (ref 6–23)
Chloride: 110 mEq/L (ref 96–112)
Creatinine, Ser: 3.71 mg/dL — ABNORMAL HIGH (ref 0.50–1.35)
GFR calc Af Amer: 19 mL/min — ABNORMAL LOW (ref >90)
Glucose, Bld: 149 mg/dL — ABNORMAL HIGH (ref 70–99)
Potassium: 4.5 mEq/L (ref 3.5–5.1)

## 2012-07-26 LAB — RENAL FUNCTION PANEL
CO2: 22 mEq/L (ref 19–32)
Calcium: 8.4 mg/dL (ref 8.4–10.5)
GFR calc Af Amer: 19 mL/min — ABNORMAL LOW (ref >90)
GFR calc non Af Amer: 16 mL/min — ABNORMAL LOW (ref >90)
Glucose, Bld: 110 mg/dL — ABNORMAL HIGH (ref 70–99)
Phosphorus: 5.2 mg/dL — ABNORMAL HIGH (ref 2.3–4.6)
Potassium: 4.2 mEq/L (ref 3.5–5.1)
Sodium: 142 mEq/L (ref 135–145)

## 2012-07-26 LAB — URINE CULTURE: Colony Count: NO GROWTH

## 2012-07-26 LAB — PROTIME-INR: INR: 2.3 — ABNORMAL HIGH (ref 0.00–1.49)

## 2012-07-26 LAB — GLUCOSE, CAPILLARY: Glucose-Capillary: 137 mg/dL — ABNORMAL HIGH (ref 70–99)

## 2012-07-26 MED ORDER — WARFARIN SODIUM 5 MG PO TABS
5.0000 mg | ORAL_TABLET | Freq: Once | ORAL | Status: AC
  Administered 2012-07-26: 5 mg via ORAL
  Filled 2012-07-26: qty 1

## 2012-07-26 MED ORDER — SODIUM CHLORIDE 0.9 % IV SOLN
250.0000 mg | Freq: Every day | INTRAVENOUS | Status: AC
  Administered 2012-07-26 – 2012-07-28 (×3): 250 mg via INTRAVENOUS
  Filled 2012-07-26 (×7): qty 20

## 2012-07-26 NOTE — Progress Notes (Signed)
ANTICOAGULATION CONSULT NOTE - Follow Up Consult  Pharmacy Consult for coumadin Indication: VTE prophylaxis  No Known Allergies  Patient Measurements: Height: 5\' 9"  (175.3 cm) Weight: 251 lb 5.2 oz (114 kg) IBW/kg (Calculated) : 70.7   Vital Signs:    Labs:  Recent Labs  07/24/12 0700 07/24/12 1249 07/25/12 0610 07/26/12 0754  HGB 8.3*  --   --   --   HCT 24.3*  --   --   --   PLT 301  --   --   --   LABPROT  --  21.9* 24.4* 24.3*  INR  --  2.00* 2.32* 2.30*  CREATININE 3.92*  --  3.80*  --     Estimated Creatinine Clearance: 25.1 ml/min (by C-G formula based on Cr of 3.8).  Assessment: 62 yo male s/p ortho surgery is currently on therapeutic coumadin. INR remains therapeutic with an INR of 2.3. No bleeding noted.   Goal of Therapy:  INR 2-3   Plan:  1. Repeat coumadin 5mg  PO x 1 tonight  2. F/u AM INR  Lysle Pearl, PharmD, BCPS Pager # [phone] 07/26/2012 8:58 AM

## 2012-07-26 NOTE — Progress Notes (Signed)
Patient ID: Roger Palmer, male   DOB: [birth year], 62 y.o.   MRN: [id number]   [facility] Progress Note    Subjective:   Reports to be feeling fair- pain controlled and encouraged by PT   Objective:   BP 130/82  Pulse 83  Temp(Src) 98.1 F (36.7 C) (Oral)  Resp 18  Ht 5\' 9"  (1.753 m)  Wt 114 kg (251 lb 5.2 oz)  BMI 37.1 kg/m2  SpO2 94%  Intake/Output Summary (Last 24 hours) at 07/26/12 1478 Last data filed at 07/26/12 0900  Gross per 24 hour  Intake    850 ml  Output   1250 ml  Net   -400 ml   Weight change:   Physical Exam: GNF:AOZHYQMVHQI sitting up in bed ONG:EXBMW RRR, normal S1 and S2  Resp: Decreased BS over bases- otherwise CTA UXL:KGMW, obese, NT, BS normal Ext: Trace LE edema  Imaging: Dg Elbow 2 Views Right  07/24/2012  *RADIOLOGY REPORT*  Clinical Data: Elbow subluxation.  RIGHT ELBOW - 2 VIEW  Comparison: 07/23/2012  Findings: Radial head prosthesis remains in appropriate position. No evidence of hardware failure or loosening.  No evidence of fracture.  There has been interval reduction of the previously seen ulnar subluxation.  IMPRESSION: Interval reduction of previously seen ulnar subluxation.  No evidence of fracture or dislocation.   Original Report Authenticated By: Myles Rosenthal, M.D.     Labs: BMET  Recent Labs Lab 07/21/12 1027 07/21/12 2536 07/22/12 0545 07/23/12 0545 07/24/12 0700 07/25/12 0610 07/26/12 0754  NA 145 144 145 146* 147* 143 142  K 3.8 3.9 3.9 4.0 4.1 4.4 4.2  CL 116* 114* 116* 114* 112 109 109  CO2 19 17* 17* 18* 24 23 22   GLUCOSE 267* 265* 220* 198* 168* 150* 110*  BUN 56* 54* 52* 46* 41* 40* 40*  CREATININE 4.50* 4.35* 4.40* 4.33* 3.92* 3.80* 3.69*  CALCIUM 8.1* 8.2* 8.2* 8.3* 8.3* 8.4 8.4  PHOS  --   --   --   --  4.7* 4.8* 5.2*   CBC  Recent Labs Lab 07/20/12 1120 07/21/12 0804 07/22/12 0545 07/23/12 0545 07/24/12 0700  WBC 16.6* 15.9* 16.3* 16.0* 16.1*  NEUTROABS 13.1*  --   --   --   --    HGB 8.4* 8.1* 8.2* 8.0* 8.3*  HCT 24.2* 23.9* 23.8* 23.4* 24.3*  MCV 87.1 91.2 89.1 89.3 89.0  PLT 269 240 253 263 301    Medications:    . darbepoetin (ARANESP) injection - NON-DIALYSIS  100 mcg Subcutaneous Q Sat-1800  . insulin aspart  0-15 Units Subcutaneous TID WC  . insulin aspart  0-5 Units Subcutaneous QHS  . pantoprazole  40 mg Oral Daily  . polyethylene glycol  17 g Oral Daily  . tamsulosin  0.4 mg Oral Daily  . warfarin  5 mg Oral ONCE-1800  . Warfarin - Pharmacist Dosing Inpatient   Does not apply q1800     Assessment/ Plan:   1. Acute renal failure: Multifactorial-primarily contrast-induced nephropathy and ATN with hypotension and ongoing ARB/ACE inhibitor in this diabetic patient. Renal function improving slowly and urine output is good off diuretics. No acute dialysis needs, no acute electrolyte concerns. Anticipate that he will settle at new higher baseline creatinine (lower renal function) 2. Multiple osseous injury status post fall: Ongoing evaluation and management per physical medicine and rehabilitation service. Status post open reduction internal fixation.  3. Anemia: Replace iron and start ESA therapy.  4. Leukocytosis: Afebrile  and suspected to be primarily the margination status post fall/fractures. Continue to monitor for features that would prompt pancultures.   Zetta Bills, MD 07/26/2012, 9:18 AM

## 2012-07-26 NOTE — Progress Notes (Signed)
Roger Palmer is a 62 y.o. male 1950/08/23 [id number]  Subjective: No new complaints. No new problems. Slept well. Feeling OK. Pain controlled  Objective: Vital signs in last 24 hours:   Weight change:  Last BM Date: 07/25/12  Intake/Output from previous day: 03/15 0701 - 03/16 0700 In: 970 [P.O.:970] Out: 1500 [Urine:1500]  Physical Exam General: No apparent distress    Lungs: Normal effort. Lungs clear to auscultation, no crackles or wheezes. Cardiovascular: Regular rate and rhythm, no edema Musculoskeletal:  Neurovascularly intact Neurological: No new neurological deficits Wounds:  Clean, dry, intact. No signs of infection.  Lab Results: BMET    Component Value Date/Time   NA 143 07/25/2012 0610   K 4.4 07/25/2012 0610   CL 109 07/25/2012 0610   CO2 23 07/25/2012 0610   GLUCOSE 150* 07/25/2012 0610   BUN 40* 07/25/2012 0610   CREATININE 3.80* 07/25/2012 0610   CALCIUM 8.4 07/25/2012 0610   GFRNONAA 16* 07/25/2012 0610   GFRAA 18* 07/25/2012 0610   CBC    Component Value Date/Time   WBC 16.1* 07/24/2012 0700   RBC 2.73* 07/24/2012 0700   HGB 8.3* 07/24/2012 0700   HCT 24.3* 07/24/2012 0700   PLT 301 07/24/2012 0700   MCV 89.0 07/24/2012 0700   MCH 30.4 07/24/2012 0700   MCHC 34.2 07/24/2012 0700   RDW 15.5 07/24/2012 0700   LYMPHSABS 1.3 07/20/2012 1120   MONOABS 1.8* 07/20/2012 1120   EOSABS 0.4 07/20/2012 1120   BASOSABS 0.1 07/20/2012 1120   CBG's (last 3):    Recent Labs  07/25/12 1751 07/25/12 2123 07/26/12 0719  GLUCAP 84 77 96   LFT's Lab Results  Component Value Date   ALT 17 07/24/2012   AST 15 07/24/2012   ALKPHOS 53 07/24/2012   BILITOT 0.5 07/24/2012    Studies/Results: Dg Elbow 2 Views Right  07/24/2012  *RADIOLOGY REPORT*  Clinical Data: Elbow subluxation.  RIGHT ELBOW - 2 VIEW  Comparison: 07/23/2012  Findings: Radial head prosthesis remains in appropriate position. No evidence of hardware failure or loosening.  No evidence of fracture.  There has  been interval reduction of the previously seen ulnar subluxation.  IMPRESSION: Interval reduction of previously seen ulnar subluxation.  No evidence of fracture or dislocation.   Original Report Authenticated By: Myles Rosenthal, M.D.     Medications:  I have reviewed the patient's current medications. Scheduled Medications: . darbepoetin (ARANESP) injection - NON-DIALYSIS  100 mcg Subcutaneous Q Sat-1800  . insulin aspart  0-15 Units Subcutaneous TID WC  . insulin aspart  0-5 Units Subcutaneous QHS  . pantoprazole  40 mg Oral Daily  . polyethylene glycol  17 g Oral Daily  . tamsulosin  0.4 mg Oral Daily  . Warfarin - Pharmacist Dosing Inpatient   Does not apply q1800   PRN Medications: acetaminophen, alum & mag hydroxide-simeth, bisacodyl, diphenhydrAMINE, guaiFENesin-dextromethorphan, methocarbamol, ondansetron (ZOFRAN) IV, ondansetron, traMADol, traZODone  Assessment/Plan: Principal Problem:   Fall from tree Active Problems:   Right acetabular fracture   Open dislocation of right elbow   Diabetes mellitus without complication   Acute blood loss anemia   Hypernatremia   Ileus   Brain injury without skull fracture Functional deficits secondary to Fall resulting in R Complex hemipelvis fracture, Right elbow dislocation, right distal radius fracture, and mild TBI 1. DVT Prophylaxis/Anticoagulation: Pharmaceutical: Coumadin. Lovenox discontinued as INR therapeutic.  2. Pain Management: Will continue prn ultram.  3. Mood: Seems to be improving  4. Neuropsych: This patient  is capable of making decisions on his/her own behalf.  5. Right anterior column/anterior wall acetabular Fx: No formal hip precautions. NWB if unable to TDWB RLE.  6. Right distal humerus and Right distal radius fractue: NWB. S/p relocation and splinting of elbow dislocation 3/14 by orthopedics.  7. Leucocytosis: Antibiotics discontinued. Will monitor for temperature curve for fevers. Continue to monitor wounds. WBC up to  16.0. Most recent CXR with clearing of infiltrates.  8. Post op ileus: Continue reglan. Add miralax to help with bowel program. Suppository prn no BM in 24 hours.  9. Acute renal failure/Metabolic acidosis: s/p bicarb drip, stopped 3/14--prev 200 cc/hr for 15 hours. Renal to follow for assistance. Recheck BMet 3/16 10. DM Type 2: Will continue to monitor with AC/HS cbg checks. Hgb A1C @6 .5. Elevated BS likely due to stress. SSI for elevated BS and titrate medications as indicated. Off metformin due to renal failure. on Amaryl and tapering off lantus.  11. GU: Incontinent of urine due to IVF and well as scrotal edema/penis retraction. Will check PVRs. .  Length of stay, days: 3  Susan C. Dennison Bulla , PA-C 07/26/2012, 8:23 AM  I have personally taken a history, examined the patient and agree with above.  Katherina Wimer A. Felicity Coyer, MD

## 2012-07-26 NOTE — Progress Notes (Signed)
Physical Therapy Session Note  Patient Details  Name: Roger Palmer MRN: [id number] Date of Birth: 18-Jul-1950  Today's Date: 07/26/2012 Time: 1102-1200 Time Calculation (min): 58 min  Short Term Goals: Week 1:  PT Short Term Goal 1 (Week 1): Pt will be able to complete basic transfers with min A PT Short Term Goal 2 (Week 1): Pt will be able to propel w/c x 100' on unit with S PT Short Term Goal 3 (Week 1): Pt will be able to complete sit to stand with mod A   Skilled Therapeutic Interventions/Progress Updates:    Initial part of session focused on bed mobility to complete lower body dressing and mod A for supine to sit from L side; assisted need for getting from supine to propped on elbow and pt able to do the rest. Pt had just finished receiving IV iron so monitored for symptoms but none presented during session except generalized fatigue. Transferred with slideboard with min A to w/c; cues body positioning needed to maintain anterior weightshift. W/c mobility for endurance and strengthening; backwards technique (LUE and LLE) with cues for direction/obstacles only. Multiple attempts at sit to stands at parallel bars for strengthening in preparation for standing activities and to aid with transfers, but unable to get into complete standing with total A; improved clearance into squat with attempts noted though. Pt fatigued and therapist pushed pt back to room to set up for lunch.  Therapy Documentation Precautions:  Precautions Precautions: Fall Restrictions Weight Bearing Restrictions: Yes RUE Weight Bearing: Non weight bearing RLE Weight Bearing: Non weight bearing Other Position/Activity Restrictions: Gentle PROM Rt. elbow and wrist per Montez Morita, PA order 07/22/12; in chart prior to CIR eval  Pain:  reports being premedicated for pain.   See FIM for current functional status  Therapy/Group: Individual Therapy  Karolee Stamps [facility] 07/26/2012, 12:13 PM

## 2012-07-27 ENCOUNTER — Inpatient Hospital Stay (HOSPITAL_COMMUNITY): Payer: 59 | Admitting: Speech Pathology

## 2012-07-27 ENCOUNTER — Inpatient Hospital Stay (HOSPITAL_COMMUNITY): Payer: 59 | Admitting: Occupational Therapy

## 2012-07-27 ENCOUNTER — Inpatient Hospital Stay (HOSPITAL_COMMUNITY): Payer: 59

## 2012-07-27 LAB — PROTIME-INR
INR: 2.41 — ABNORMAL HIGH (ref 0.00–1.49)
Prothrombin Time: 25.1 seconds — ABNORMAL HIGH (ref 11.6–15.2)

## 2012-07-27 LAB — RENAL FUNCTION PANEL
Albumin: 2.4 g/dL — ABNORMAL LOW (ref 3.5–5.2)
CO2: 22 mEq/L (ref 19–32)
Chloride: 112 mEq/L (ref 96–112)
GFR calc Af Amer: 18 mL/min — ABNORMAL LOW (ref >90)
GFR calc non Af Amer: 16 mL/min — ABNORMAL LOW (ref >90)
Potassium: 4.1 mEq/L (ref 3.5–5.1)

## 2012-07-27 LAB — GLUCOSE, CAPILLARY
Glucose-Capillary: >600 mg/dL (ref 70–99)
Glucose-Capillary: 113 mg/dL — ABNORMAL HIGH (ref 70–99)
Glucose-Capillary: 114 mg/dL — ABNORMAL HIGH (ref 70–99)
Glucose-Capillary: 126 mg/dL — ABNORMAL HIGH (ref 70–99)

## 2012-07-27 MED ORDER — WARFARIN SODIUM 5 MG PO TABS
5.0000 mg | ORAL_TABLET | Freq: Once | ORAL | Status: AC
  Administered 2012-07-27: 5 mg via ORAL
  Filled 2012-07-27: qty 1

## 2012-07-27 NOTE — Progress Notes (Signed)
Subjective/Complaints: Some drainage from elbow. Elbow itself feeling better.  A 12 point review of systems has been performed and if not noted above is otherwise negative.   Objective: Vital Signs: Blood pressure 133/81, pulse 90, temperature 98.1 F (36.7 C), temperature source Oral, resp. rate 18, height 5\' 9"  (1.753 m), weight 114 kg (251 lb 5.2 oz), SpO2 96.00%. No results found. No results found for this basename: WBC, HGB, HCT, PLT,  in the last 72 hours  Recent Labs  07/26/12 0917 07/27/12 0607  NA 143 144  K 4.5 4.1  CL 110 112  GLUCOSE 149* 128*  BUN 40* 39*  CREATININE 3.71* 3.83*  CALCIUM 8.3* 8.2*   CBG (last 3)   Recent Labs  07/26/12 1635 07/26/12 2110 07/27/12 0707  GLUCAP 123* 137* 113*    Wt Readings from Last 3 Encounters:  07/24/12 114 kg (251 lb 5.2 oz)  07/12/12 102.059 kg (225 lb)  07/12/12 102.059 kg (225 lb)    Physical Exam:  Constitutional: He is oriented to person, place, and time. He appears well-developed and well-nourished.  Flat affect, no distress  HENT:  Head: Normocephalic and atraumatic.  Eyes: Pupils are equal, round, and reactive to light.  Neck: Normal range of motion.  Cardiovascular: Normal rate and regular rhythm.  Pulmonary/Chest: Effort normal and breath sounds normal.  Abdominal: He exhibits distension. There is no tenderness.  + BS.   Genitourinary:  1 to 2+ scrotal edema Musculoskeletal: He exhibits edema.  RUE-1+ edema at hand and dependently at elbow.right arm dressed. Arm not seated in splint and is at a 120 degree angle, tender with touch, rom. RLE- Incision right groin and With sutures in place--slight drainage without erythema. Abraded lacerated area right lateral thigh with moderate amount of serous drainage. 2+ pitting edema RLE. 1+ edema LLE.  Neurological: He is alert and oriented to person, place, and time.  Speech measured. Follows basic commands without difficulty. Mild delay noted.   Skin: Skin is  warm. See above Psychiatric: Thought content normal. His affect is blunt. He is slowed at times. Cognition and memory are fair. Reasonable insight and awareness In motor strength is 5/5 in the left deltoid, biceps, grip left lower extremity 4 minus at the hip flexor knee extensor ankle dorsiflexor plantar flexor  Right lower extremity 3 minus hip flexor knee extensor 4 minus ankle dorsiflexor plantar flexor  Right upper extremity is in splint,     Assessment/Plan: 1. Functional deficits secondary to complex right hemipelvis fx, right elbow dislocation with distal radius fx, mild TBI which require 3+ hours per day of interdisciplinary therapy in a comprehensive inpatient rehab setting. Physiatrist is providing close team supervision and 24 hour management of active medical problems listed below. Physiatrist and rehab team continue to assess barriers to discharge/monitor patient progress toward functional and medical goals. FIM: FIM - Bathing Bathing Steps Patient Completed: Chest;Right Arm;Abdomen;Right upper leg Bathing: 1: Two helpers  FIM - Upper Body Dressing/Undressing Upper body dressing/undressing steps patient completed: Put head through opening of pull over shirt/dress;Thread/unthread left sleeve of pullover shirt/dress Upper body dressing/undressing: 3: Mod-Patient completed 50-74% of tasks FIM - Lower Body Dressing/Undressing Lower body dressing/undressing: 1: Two helpers  FIM - Hotel manager Devices: Grab bar or rail for support Toileting: 1: Two helpers  FIM - Diplomatic Services operational officer Devices: Psychiatrist Transfers: 1-Two helpers  FIM - Banker Devices: Sliding board;Arm rests Bed/Chair Transfer: 1: Two helpers  FIM -  Locomotion: Wheelchair Distance: 150 Locomotion: Wheelchair: 5: Travels 150 ft or more: maneuvers on rugs and over door sills with supervision, cueing or coaxing (going  backwards with L hemi technique) FIM - Locomotion: Ambulation Ambulation/Gait Assistance: Not tested (comment) (unsafe at eval due to NWB RLE and RUE) Locomotion: Ambulation: 0: Activity did not occur  Comprehension Comprehension Mode: Auditory Comprehension: 5-Understands complex 90% of the time/Cues < 10% of the time  Expression Expression Mode: Verbal Expression: 5-Expresses complex 90% of the time/cues < 10% of the time  Social Interaction Social Interaction Mode: Asleep Social Interaction: 5-Interacts appropriately 90% of the time - Needs monitoring or encouragement for participation or interaction.  Problem Solving Problem Solving Mode: Asleep Problem Solving: 5-Solves basic problems: With no assist  Memory Memory Mode: Asleep Memory: 6-More than reasonable amt of time  Medical Problem List and Plan:  1. DVT Prophylaxis/Anticoagulation: Pharmaceutical: Coumadin. Lovenox discontinued as INR therapeutic.  2. Pain Management: Will continue prn ultram.  3. Mood:  flat  4. Neuropsych: This patient is capable of making decisions on his/her own behalf.  5. Right anterior column/anterior wall acetabular Fx: No formal hip precautions. NWB if unable to TDWB RLE.  6. Right distal humerus and Right distal radius fractue: NWB.  -adjustments in splint have held arm in place  -xrays on Friday showed good position.  -dry dressing for drainage. 7. Leucocytosis: Antibiotics discontinued. Will monitor for temperature curve for fevers. Continue to monitor wounds. Recheck cbc tomorow. Urine culture negative 8. Post op ileus: Continue reglan. Added miralax to help with bowel program. Suppository prn no BM in 24 hours.  9. Acute renal failure/Metabolic acidosis: s/p bicarb drip. Renal to follow for assistance.  10. DM Type 2: Will continue to monitor with AC/HS cbg checks. Hgb A1C @6 .5. Elevated BS likely due to stress. SSI for elevated BS and titrate medications as indicated. Off metformin due  to renal failure. added Amaryl / off lantus.  11. GU: Incontinent of urine due to IVF and well as scrotal edema/penis retraction.  check PVRs.   UA/UCS pending   LOS (Days) 4 A FACE TO FACE EVALUATION WAS PERFORMED  SWARTZ,ZACHARY T 07/27/2012 8:30 AM

## 2012-07-27 NOTE — Progress Notes (Signed)
ANTICOAGULATION CONSULT NOTE - Follow Up Consult  Pharmacy Consult for coumadin Indication: VTE prophylaxis  No Known Allergies  Patient Measurements: Height: 5\' 9"  (175.3 cm) Weight: 251 lb 5.2 oz (114 kg) IBW/kg (Calculated) : 70.7 Heparin Dosing Weight:   Vital Signs: Temp: 98.1 F (36.7 C) (03/17 0509) BP: 133/81 mmHg (03/17 0509) Pulse Rate: 90 (03/17 0509)  Labs:  Recent Labs  07/25/12 0610 07/26/12 0754 07/26/12 0917 07/27/12 0607  LABPROT 24.4* 24.3*  --  25.1*  INR 2.32* 2.30*  --  2.41*  CREATININE 3.80* 3.69* 3.71* 3.83*    Estimated Creatinine Clearance: 24.9 ml/min (by C-G formula based on Cr of 3.83).   Medications:  Scheduled:  . darbepoetin (ARANESP) injection - NON-DIALYSIS  100 mcg Subcutaneous Q Sat-1800  . ferric gluconate (FERRLECIT/NULECIT) IV  250 mg Intravenous Daily  . insulin aspart  0-15 Units Subcutaneous TID WC  . insulin aspart  0-5 Units Subcutaneous QHS  . pantoprazole  40 mg Oral Daily  . polyethylene glycol  17 g Oral Daily  . tamsulosin  0.4 mg Oral Daily  . [COMPLETED] warfarin  5 mg Oral ONCE-1800  . warfarin  5 mg Oral ONCE-1800  . Warfarin - Pharmacist Dosing Inpatient   Does not apply q1800   Infusions:    Assessment: 62 yo male s/p ortho surgery is currently on therapeutic coumadin.  INR today is 2.41 Goal of Therapy:  INR 2-3    Plan:  1) Repeat coumadin 5mg  po x1 tonight 2) INR in am.  Brittni Hult, Tsz-Yin 07/27/2012,8:35 AM

## 2012-07-27 NOTE — Progress Notes (Signed)
I have seen and examined the patient. I agree with the findings above.  Myrene Galas, MD [facility] [phone] (p)

## 2012-07-27 NOTE — Plan of Care (Signed)
Problem: RH BLADDER ELIMINATION Goal: RH STG MANAGE BLADDER WITH ASSISTANCE STG Manage Bladder With min Assistance  Outcome: Progressing Uses urinal. Requires positioning d/t R arm fx

## 2012-07-27 NOTE — Progress Notes (Signed)
[facility] ROUNDING NOTE   Subjective:   Interval History: no complaints  Objective:  Vital signs in last 24 hours:  Temp:  [98.1 F (36.7 C)-98.9 F (37.2 C)] 98.1 F (36.7 C) (03/17 0509) Pulse Rate:  [84-95] 90 (03/17 0509) Resp:  [18-20] 18 (03/17 0509) BP: (133-146)/(75-83) 133/81 mmHg (03/17 0509) SpO2:  [94 %-99 %] 96 % (03/17 0509)  Weight change:  Filed Weights   07/24/12 0500  Weight: 114 kg (251 lb 5.2 oz)    Intake/Output: I/O last 3 completed shifts: In: 1400 [P.O.:1400] Out: 3575 [Urine:3575]   Intake/Output this shift:  Total I/O In: 360 [P.O.:360] Out: -   WGN:FAOZHYQMVHQ sitting up in bed  ION:GEXBM RRR, normal S1 and S2  Resp: Decreased BS over bases- otherwise CTA  WUX:LKGM, obese, NT, BS normal  Ext: Trace LE edema    Basic Metabolic Panel:  Recent Labs Lab 07/24/12 0700 07/25/12 0610 07/26/12 0754 07/26/12 0917 07/27/12 0607  NA 147* 143 142 143 144  K 4.1 4.4 4.2 4.5 4.1  CL 112 109 109 110 112  CO2 24 23 22 22 22   GLUCOSE 168* 150* 110* 149* 128*  BUN 41* 40* 40* 40* 39*  CREATININE 3.92* 3.80* 3.69* 3.71* 3.83*  CALCIUM 8.3* 8.4 8.4 8.3* 8.2*  PHOS 4.7* 4.8* 5.2*  --  5.2*    Liver Function Tests:  Recent Labs Lab 07/24/12 0700 07/25/12 0610 07/26/12 0754 07/27/12 0607  AST 15  --   --   --   ALT 17  --   --   --   ALKPHOS 53  --   --   --   BILITOT 0.5  --   --   --   PROT 5.7*  --   --   --   ALBUMIN 2.3*  2.3* 2.4* 2.4* 2.4*   No results found for this basename: LIPASE, AMYLASE,  in the last 168 hours No results found for this basename: AMMONIA,  in the last 168 hours  CBC:  Recent Labs Lab 07/20/12 1120 07/21/12 0804 07/22/12 0545 07/23/12 0545 07/24/12 0700  WBC 16.6* 15.9* 16.3* 16.0* 16.1*  NEUTROABS 13.1*  --   --   --   --   HGB 8.4* 8.1* 8.2* 8.0* 8.3*  HCT 24.2* 23.9* 23.8* 23.4* 24.3*  MCV 87.1 91.2 89.1 89.3 89.0  PLT 269 240 253 263 301    Cardiac Enzymes: No  results found for this basename: CKTOTAL, CKMB, CKMBINDEX, TROPONINI,  in the last 168 hours  BNP: No components found with this basename: POCBNP,   CBG:  Recent Labs Lab 07/26/12 0719 07/26/12 1140 07/26/12 1635 07/26/12 2110 07/27/12 0707  GLUCAP 96 101* 123* 137* 113*    Microbiology: Results for orders placed during the hospital encounter of 07/23/12  URINE CULTURE     Status: None   Collection Time    07/24/12  9:13 PM      Result Value Range Status   Specimen Description URINE, CLEAN CATCH   Final   Special Requests NONE   Final   Culture  Setup Time 07/25/2012 05:14   Final   Colony Count NO GROWTH   Final   Culture NO GROWTH   Final   Report Status 07/26/2012 FINAL   Final    Coagulation Studies:  Recent Labs  07/24/12 1249 07/25/12 0610 07/26/12 0754 07/27/12 0607  LABPROT 21.9* 24.4* 24.3* 25.1*  INR 2.00* 2.32* 2.30* 2.41*    Urinalysis:  Recent  Labs  07/24/12 2113  COLORURINE YELLOW  LABSPEC 1.009  PHURINE 7.5  GLUCOSEU NEGATIVE  HGBUR TRACE*  BILIRUBINUR NEGATIVE  KETONESUR NEGATIVE  PROTEINUR NEGATIVE  UROBILINOGEN 0.2  NITRITE NEGATIVE  LEUKOCYTESUR NEGATIVE      Imaging: No results found.   Medications:     . darbepoetin (ARANESP) injection - NON-DIALYSIS  100 mcg Subcutaneous Q Sat-1800  . ferric gluconate (FERRLECIT/NULECIT) IV  250 mg Intravenous Daily  . insulin aspart  0-15 Units Subcutaneous TID WC  . insulin aspart  0-5 Units Subcutaneous QHS  . pantoprazole  40 mg Oral Daily  . polyethylene glycol  17 g Oral Daily  . tamsulosin  0.4 mg Oral Daily  . warfarin  5 mg Oral ONCE-1800  . Warfarin - Pharmacist Dosing Inpatient   Does not apply q1800   acetaminophen, alum & mag hydroxide-simeth, bisacodyl, diphenhydrAMINE, guaiFENesin-dextromethorphan, methocarbamol, ondansetron (ZOFRAN) IV, ondansetron, traMADol, traZODone  Assessment/ Plan:  1. Acute renal failure: Multifactorial-primarily contrast-induced nephropathy  and ATN with hypotension and ongoing ARB/ACE inhibitor in this diabetic patient.renal function stable will follow 2. Multiple osseous injury status post fall: Ongoing evaluation and management per physical medicine and rehabilitation service. Status post open reduction internal fixation.  3. Anemia: Replace iron and start ESA therapy.  4. Leukocytosis: Afebrile and suspected to be primarily the margination status post fall/fractures. Continue to monitor for features that would prompt pancultures.      LOS: 4 Tarina Volk W @TODAY @10 :06 AM

## 2012-07-27 NOTE — Progress Notes (Signed)
Occupational Therapy Session Notes  Patient Details  Name: Roger Palmer MRN: [id number] Date of Birth: 04-17-1951  Today's Date: 07/27/2012  Short Term Goals: Week 1:  OT Short Term Goal 1 (Week 1): Patient will perform UB dressing with moderate assistance OT Short Term Goal 2 (Week 1): Patient will perform LB dressing with maximal assistance OT Short Term Goal 3 (Week 1): Patient will complete grooming tassk of brushing teeth, washing face, and washing hands at sink with minimal assistance OT Short Term Goal 4 (Week 1): Patient will perform toilet transfer with moderate assistance  Skilled Therapeutic Interventions/Progress Updates:   Session #1 [phone] - 55 Minutes Individual Therapy No complaints of pain Patient found supine in bed. Engaged in bed mobility for donning of left TED hose and pants. Notified RN of skin breakdown around sacrum area, RN donned dressing. Patient then sat edge of bed and transferred into w/c with minimal assistance using slide board. Patient then donned shirt with moderate assistance and performed grooming tasks seated at sink in w/c independently, patient did require assistance propelling to sink. Therapist discussed safest and most effective way to transfer in/out of bed (left side secondary to NWB status -> RLE&RUE), and re-arranged room to accommodate patient's needs. Therapist gave patient "Home Measurement' handout for wife or daughter to fill out regarding heights and widths of items and doorways throughout house. At end of session left patient seated in w/c on left side of bed with call bell & phone within reach.   Session #2 1130-1200 - 30 Minutes Individual Therapy No complaints of pain Patient found seated in w/c. Patient attempted to maneuver w/c throughout room in order to go into bathroom for toilet transfer on/off drop arm BSC. Patient able to perform transfer <> drop arm with moderate assistance from therapist using grab bars, squat pivot  transfer performed. Patient then maneuvered self from bathroom -> left side of bed. Therapist performed PROM -> right shoulder. Left patient seated in w/c beside bed with call bell & phone within reach.   Precautions:  Precautions Precautions: Fall Restrictions Weight Bearing Restrictions: Yes RUE Weight Bearing: Non weight bearing RLE Weight Bearing: Non weight bearing Other Position/Activity Restrictions: Gentle PROM Rt. elbow and wrist per Montez Morita, PA order 07/22/12; in chart prior to CIR eval  See FIM for current functional status  Divine Hansley 07/27/2012, 7:32 AM

## 2012-07-27 NOTE — Progress Notes (Signed)
Physical Therapy Session Note  Patient Details  Name: Roger Palmer MRN: [id number] Date of Birth: 1950/06/13  Today's Date: 07/27/2012 Time: 0930-1030 Time Calculation (min): 60 min  Short Term Goals: Week 1:  PT Short Term Goal 1 (Week 1): Pt will be able to complete basic transfers with min A PT Short Term Goal 2 (Week 1): Pt will be able to propel w/c x 100' on unit with S PT Short Term Goal 3 (Week 1): Pt will be able to complete sit to stand with mod A   Skilled Therapeutic Interventions/Progress Updates:    Treatment session focused on  w/c mobility (forwards today) with L hemi-technique with S for endurance and strengthening; 2 rest breaks needed to get to gym and transfer training including squat pivot and sit to stands. Pt transferred with S from w/c to mat with set up of w/c (unable to reach R side with L hand in order to be able to take off leg rests) including placing board on L independently. Progressed to scoot/squats on edge of mat to R and L multiple times in preparation for squat pivot transfer. Practiced squat pivot transfers to R and L mat <-> w/c multiple reps to both sides; pt with good clearance and able to get into standing a few times; cues for hand placement initially varying from S to heavy min A. Practiced sit to stand from elevated mat with hemiwalker on L with min A; initially unable to maintain standing very long progressed to 22 seconds and then to 1 minute with min A for static standing. Rest breaks taken as needed; excellent improvement noted today.    Therapy Documentation Precautions:  Precautions Precautions: Fall Restrictions Weight Bearing Restrictions: Yes RUE Weight Bearing: Non weight bearing RLE Weight Bearing: Non weight bearing Other Position/Activity Restrictions: Gentle PROM Rt. elbow and wrist per Montez Morita, PA order 07/22/12; in chart prior to CIR eval  Pain: Reports pain in R hip - premedicated.  See FIM for current functional  status  Therapy/Group: Individual Therapy  Karolee Stamps [facility] 07/27/2012, 11:38 AM

## 2012-07-27 NOTE — Progress Notes (Signed)
Physical Therapy Session Note  Patient Details  Name: Roger Palmer MRN: [id number] Date of Birth: August 12, 1950  Today's Date: 07/27/2012 Time: 1400-1445 Time Calculation (min): 45 min  Short Term Goals: Week 1:  PT Short Term Goal 1 (Week 1): Pt will be able to complete basic transfers with min A PT Short Term Goal 2 (Week 1): Pt will be able to propel w/c x 100' on unit with S PT Short Term Goal 3 (Week 1): Pt will be able to complete sit to stand with mod A   Skilled Therapeutic Interventions/Progress Updates:    Session focused on overall activity tolerance/endurance, w/c mobility, transfer training, LE strengthening exercises, and bed mobility on flat surface. W/c propulsion half of the way to the gym forwards and then backwards the second half due to fatigue (easier for pt to go backwards) with LLE and LUE. Squat pivot transfers (assist needed for set up) with close S/steady A w/c <-> mat. Seated therex bilateral LE's with AAROM on RLE including ankle pumps and LAQ; in supine R hip abduction AAROM all 10 reps each bilaterally x 3 sets. Bed mobility on flat surface retraining; mod A for supine to sit and min A for sit to supine (assist needed with RLE); scrotal edema also limiting mobility due to discomfort.   Therapy Documentation Precautions:  Precautions Precautions: Fall Restrictions Weight Bearing Restrictions: Yes RUE Weight Bearing: Non weight bearing RLE Weight Bearing: Non weight bearing Other Position/Activity Restrictions: Gentle PROM Rt. elbow and wrist per Montez Morita, PA order 07/22/12; in chart prior to CIR eval    Pain:  Premedicated for hip and shoulder pain on R.  See FIM for current functional status  Therapy/Group: Individual Therapy  Karolee Stamps [facility] 07/27/2012, 2:51 PM

## 2012-07-28 ENCOUNTER — Inpatient Hospital Stay (HOSPITAL_COMMUNITY): Payer: 59

## 2012-07-28 ENCOUNTER — Inpatient Hospital Stay (HOSPITAL_COMMUNITY): Payer: 59 | Admitting: Occupational Therapy

## 2012-07-28 LAB — GLUCOSE, CAPILLARY
Glucose-Capillary: 129 mg/dL — ABNORMAL HIGH (ref 70–99)
Glucose-Capillary: 101 mg/dL — ABNORMAL HIGH (ref 70–99)

## 2012-07-28 LAB — PROTIME-INR: INR: 2.37 — ABNORMAL HIGH (ref 0.00–1.49)

## 2012-07-28 MED ORDER — WARFARIN SODIUM 5 MG PO TABS
5.0000 mg | ORAL_TABLET | Freq: Once | ORAL | Status: AC
  Administered 2012-07-28: 5 mg via ORAL
  Filled 2012-07-28: qty 1

## 2012-07-28 NOTE — Progress Notes (Signed)
Physical Therapy Session Note  Patient Details  Name: Roger Palmer MRN: [id number] Date of Birth: Nov 05, 1950  Today's Date: 07/28/2012 Time: 0830-0930 Time Calculation (min): 60 min  Short Term Goals: Week 1:  PT Short Term Goal 1 (Week 1): Pt will be able to complete basic transfers with min A PT Short Term Goal 2 (Week 1): Pt will be able to propel w/c x 100' on unit with S PT Short Term Goal 3 (Week 1): Pt will be able to complete sit to stand with mod A   Skilled Therapeutic Interventions/Progress Updates:    Pt had self propelled w/c down to therapy gym. Therapist assisted with set up for transfer (removing R legrest and armrest) and pt completed squat pivot transfer to R with close S level surface (steady A to hold w/c) and steady A for transfer back into w/c squat pivot at end of session. Supine therex (mod A to go into supine down to L side) for functional strengthening including ankle pumps, heel slides, SAQ, hip abduction, glut/quad sets, and LAQ (seated) x 10 reps x 3 sets bilaterally with AAROM needed on R (except ankle pumps, SAQ, quad/glut sets). Propelled w/c back to room mod I on unit; set up with all needs in place.  Therapy Documentation Precautions:  Precautions Precautions: Fall Restrictions Weight Bearing Restrictions: Yes RUE Weight Bearing: Non weight bearing RLE Weight Bearing: Non weight bearing Other Position/Activity Restrictions: Gentle PROM Rt. elbow and wrist per Montez Morita, PA order 07/22/12; in chart prior to CIR eval  Pain:  Declines pain medication - reports difficult time sleeping last night due to pain in R hip and RUE.  See FIM for current functional status  Therapy/Group: Individual Therapy  Karolee Stamps [facility] 07/28/2012, 8:40 AM

## 2012-07-28 NOTE — Progress Notes (Signed)
ANTICOAGULATION CONSULT NOTE - Follow Up Consult  Pharmacy Consult for coumadin Indication: VTE prophylaxis  No Known Allergies  Patient Measurements: Height: 5\' 9"  (175.3 cm) Weight: 251 lb 5.2 oz (114 kg) IBW/kg (Calculated) : 70.7 Heparin Dosing Weight:   Vital Signs: Temp: 98.3 F (36.8 C) (03/18 0459) Temp src: Oral (03/18 0459) BP: 145/81 mmHg (03/18 0459) Pulse Rate: 89 (03/18 0459)  Labs:  Recent Labs  07/26/12 0754 07/26/12 0917 07/27/12 0607 07/28/12 0536  LABPROT 24.3*  --  25.1* 24.8*  INR 2.30*  --  2.41* 2.37*  CREATININE 3.69* 3.71* 3.83*  --     Estimated Creatinine Clearance: 24.9 ml/min (by C-G formula based on Cr of 3.83).   Medications:  Scheduled:  . darbepoetin (ARANESP) injection - NON-DIALYSIS  100 mcg Subcutaneous Q Sat-1800  . ferric gluconate (FERRLECIT/NULECIT) IV  250 mg Intravenous Daily  . insulin aspart  0-15 Units Subcutaneous TID WC  . insulin aspart  0-5 Units Subcutaneous QHS  . pantoprazole  40 mg Oral Daily  . polyethylene glycol  17 g Oral Daily  . tamsulosin  0.4 mg Oral Daily  . [COMPLETED] warfarin  5 mg Oral ONCE-1800  . Warfarin - Pharmacist Dosing Inpatient   Does not apply q1800   Infusions:    Assessment: 62 yo male s/p ortho surgery is currently on therapeutic coumadin.  INR today is 2.37  Goal of Therapy:  INR 2-3    Plan:  1) Repeat coumadin 5mg  po x1 tonight 2) INR in am.  Wendie Simmer, PharmD, BCPS Clinical Pharmacist  Pager: [phone]

## 2012-07-28 NOTE — Progress Notes (Signed)
Subjective/Complaints: Still a little concerned regarding drainage from elbow incision. Pain under reasonable control. A 12 point review of systems has been performed and if not noted above is otherwise negative.   Objective: Vital Signs: Blood pressure 145/81, pulse 89, temperature 98.3 F (36.8 C), temperature source Oral, resp. rate 19, height 5\' 9"  (1.753 m), weight 114 kg (251 lb 5.2 oz), SpO2 97.00%. No results found. No results found for this basename: WBC, HGB, HCT, PLT,  in the last 72 hours  Recent Labs  07/26/12 0917 07/27/12 0607  NA 143 144  K 4.5 4.1  CL 110 112  GLUCOSE 149* 128*  BUN 40* 39*  CREATININE 3.71* 3.83*  CALCIUM 8.3* 8.2*   CBG (last 3)   Recent Labs  07/27/12 1640 07/27/12 2132 07/28/12 0713  GLUCAP 114* 126* 127*    Wt Readings from Last 3 Encounters:  07/24/12 114 kg (251 lb 5.2 oz)  07/12/12 102.059 kg (225 lb)  07/12/12 102.059 kg (225 lb)    Physical Exam:  Constitutional: He is oriented to person, place, and time. He appears well-developed and well-nourished.  Flat affect, no distress  HENT:  Head: Normocephalic and atraumatic.  Eyes: Pupils are equal, round, and reactive to light.  Neck: Normal range of motion.  Cardiovascular: Normal rate and regular rhythm.  Pulmonary/Chest: Effort normal and breath sounds normal.  Abdominal: He exhibits distension. There is no tenderness.  + BS.   Genitourinary:  1 to 2+ scrotal edema Musculoskeletal: He exhibits edema.  RUE-1+ edema at hand and dependently at elbow.right arm dressed. Arm not seated in splint and is at a 120 degree angle, tender with touch, rom. RLE- Incision right groin and With sutures in place--slight drainage without erythema. Abraded lacerated area right lateral thigh with moderate amount of serous drainage. 2+ pitting edema RLE. 1+ edema LLE.  Neurological: He is alert and oriented to person, place, and time.  Speech measured. Follows basic commands without  difficulty. Mild delay noted.   Skin: Skin is warm. See above Psychiatric: Thought content normal. His affect is blunt. He is slowed at times. Cognition and memory are fair. Reasonable insight and awareness In motor strength is 5/5 in the left deltoid, biceps, grip left lower extremity 4 minus at the hip flexor knee extensor ankle dorsiflexor plantar flexor  Right lower extremity 3 minus hip flexor knee extensor 4 minus ankle dorsiflexor plantar flexor  Right upper extremity is in splint,     Assessment/Plan: 1. Functional deficits secondary to complex right hemipelvis fx, right elbow dislocation with distal radius fx, mild TBI which require 3+ hours per day of interdisciplinary therapy in a comprehensive inpatient rehab setting. Physiatrist is providing close team supervision and 24 hour management of active medical problems listed below. Physiatrist and rehab team continue to assess barriers to discharge/monitor patient progress toward functional and medical goals. FIM: FIM - Bathing Bathing Steps Patient Completed: Chest;Right Arm;Abdomen;Right upper leg Bathing: 0: Activity did not occur  FIM - Upper Body Dressing/Undressing Upper body dressing/undressing steps patient completed: Thread/unthread left sleeve of pullover shirt/dress Upper body dressing/undressing: 2: Max-Patient completed 25-49% of tasks FIM - Lower Body Dressing/Undressing Lower body dressing/undressing steps patient completed: Thread/unthread right underwear leg;Thread/unthread left underwear leg Lower body dressing/undressing: 2: Max-Patient completed 25-49% of tasks  FIM - Hotel manager Devices: Grab bar or rail for support Toileting: 0: Activity did not occur  FIM - Diplomatic Services operational officer Devices: Bedside commode;Grab bars Toilet Transfers: 0-Activity did not occur  FIM - Banker Devices: Arm rests;Bed rails Bed/Chair Transfer: 5:  Supine > Sit: Supervision (verbal cues/safety issues);4: Bed > Chair or W/C: Min A (steadying Pt. > 75%)  FIM - Locomotion: Wheelchair Distance: 150 Locomotion: Wheelchair: 5: Travels 150 ft or more: maneuvers on rugs and over door sills with supervision, cueing or coaxing FIM - Locomotion: Ambulation Ambulation/Gait Assistance: Not tested (comment) (unsafe at eval due to NWB RLE and RUE) Locomotion: Ambulation: 0: Activity did not occur  Comprehension Comprehension Mode: Auditory Comprehension: 6-Follows complex conversation/direction: With extra time/assistive device  Expression Expression Mode: Verbal Expression: 6-Expresses complex ideas: With extra time/assistive device  Social Interaction Social Interaction Mode: Asleep Social Interaction: 6-Interacts appropriately with others with medication or extra time (anti-anxiety, antidepressant).  Problem Solving Problem Solving Mode: Asleep Problem Solving: 5-Solves complex 90% of the time/cues < 10% of the time  Memory Memory Mode: Asleep Memory: 6-More than reasonable amt of time  Medical Problem List and Plan:  1. DVT Prophylaxis/Anticoagulation: Pharmaceutical: Coumadin. Lovenox discontinued as INR therapeutic.  2. Pain Management: Will continue prn ultram.  3. Mood:  flat  4. Neuropsych: This patient is capable of making decisions on his/her own behalf.  5. Right anterior column/anterior wall acetabular Fx: No formal hip precautions. NWB if unable to TDWB RLE.   -dc sutures from hypogastric area 6. Right distal humerus and Right distal radius fractue: NWB.  -adjustments in splint have held arm in place  -xrays on Friday showed good position.  -dry dressing for drainage. 7. Leucocytosis: Antibiotics discontinued. Will monitor for temperature curve for fevers. Continue to monitor wounds. Recheck cbc tomorow. Urine culture negative 8. Post op ileus: Continue reglan. Added miralax to help with bowel program. Suppository prn no  BM in 24 hours.  9. Acute renal failure/Metabolic acidosis: continue per renal recs 10. DM Type 2: Will continue to monitor with AC/HS cbg checks. Hgb A1C @6 .5. Elevated BS likely due to stress. SSI for elevated BS and titrate medications as indicated. Off metformin due to renal failure. added Amaryl / off lantus.  11. GU: Incontinent of urine due to IVF and well as scrotal edema/penis retraction.  check PVRs.   UA/UCS negative  LOS (Days) 5 A FACE TO FACE EVALUATION WAS PERFORMED  SWARTZ,ZACHARY T 07/28/2012 9:14 AM

## 2012-07-28 NOTE — Progress Notes (Signed)
Occupational Therapy Session Notes  Patient Details  Name: Roger Palmer MRN: [id number] Date of Birth: 1950-09-03  Today's Date: 07/28/2012  Short Term Goals: Week 1:  OT Short Term Goal 1 (Week 1): Patient will perform UB dressing with moderate assistance OT Short Term Goal 2 (Week 1): Patient will perform LB dressing with maximal assistance OT Short Term Goal 3 (Week 1): Patient will complete grooming tassk of brushing teeth, washing face, and washing hands at sink with minimal assistance OT Short Term Goal 4 (Week 1): Patient will perform toilet transfer with moderate assistance  Skilled Therapeutic Interventions/Progress Updates:   Session #1 [phone] - 55 Minutes Individual Therapy Patient with 4/10 complaints of pain in RLE, patient refused any pain medications - RN made aware Patient found supine in bed. Therapist introduced reacher to assist with LB dressing and increase independence. Patient used reacher to thread bilateral feet into pants. Patient then required minimal assistance to pull pants up to waist. Once pants donned, patient engaged in bed mobility and sat edge of bed, therapist unable to donn sock or use sock aid secondary to restricted ROM in LUE. Patient donned left shoe with minimal assistance. Patient sat edge of bed and donned shirt with maximal assistance, patient with complaints of pain when trying to get shirt over RUE/splint. Donned sling to keep elbow greater than 90*. Patient then transferred from edge of bed -> w/c (squat pivot transfer) with minimal assistance. Once in w/c, patient maneuvered self around room and sat at sink in w/c for grooming tasks at an independent level. Patient then propelled self -> therapy gym for next therapy session.   Session #2 1130-1200 - 30 Minutes Individual Therapy No complaints of pain Patient found seated in w/c beside bed. Patient propelled self from room -> ADL apartment in order for therapist to introduce tub transfer  bench, recommending tub transfer bench for patient's tub shower or shower stall (patient states he can get w/c into shower stall). Patient then engaged in bed mobility on/off regular bed, patient only needed assistance with managing RLE, patient did not need bed rails to get up/down. Will introduce leg lifter to increase independence with bed mobility. At end of session therapist propelled patient back to room and left seated in w/c with call bell & phone within reach.   Precautions:  Precautions Precautions: Fall Restrictions Weight Bearing Restrictions: Yes RUE Weight Bearing: Non weight bearing RLE Weight Bearing: Non weight bearing Other Position/Activity Restrictions: Gentle PROM Rt. elbow and wrist per Montez Morita, PA order 07/22/12; in chart prior to CIR eval  See FIM for current functional status  [name] 07/28/2012, 7:34 AM

## 2012-07-28 NOTE — Progress Notes (Signed)
Physical Therapy Note  Patient Details  Name: Roger Palmer MRN: [id number] Date of Birth: 1951-04-13 Today's Date: 07/28/2012  1:30 - 2:20 50 minutes Individual session Patient denies pain.  Patient sitting in wheelchair with IV upon entering room. Patient propelled wheelchair 200+ feet to apartment - half way going forwards with hemi technique and half way going backwards using left LE. Patient performed squat pivot transfer wheelchair to regular double bed with supervision for transfer and min assist for wheelchair set up. Patient sit to supine using leg lifter for right LE with supervision. Patient supine to sit also with leg lifter and supervision/verbal cues. Patient transferred bed to wheelchair with supervision squat pivot. Patient sit to stand with hemiwalker and min assist. He maintained standing 45 seconds, 30 seconds, and 1 minute 45 seconds. Patient propelled wheelchair back to room. Patient transferred from wheelchair to recliner with min assist squat pivot. Patient left in recliner with all items in reach.    Arelia Longest M 07/28/2012, 2:47 PM

## 2012-07-28 NOTE — Progress Notes (Signed)
[facility] ROUNDING NOTE   Subjective:   Interval History:appears clinically better  Objective:  Vital signs in last 24 hours:  Temp:  [98 F (36.7 C)-98.3 F (36.8 C)] 98.3 F (36.8 C) (03/18 0459) Pulse Rate:  [89-93] 89 (03/18 0459) Resp:  [18-19] 19 (03/18 0459) BP: (145-147)/(80-81) 145/81 mmHg (03/18 0459) SpO2:  [94 %-97 %] 97 % (03/18 0459)  Weight change:  Filed Weights   07/24/12 0500  Weight: 114 kg (251 lb 5.2 oz)    Intake/Output: I/O last 3 completed shifts: In: 1400 [P.O.:1400] Out: 3325 [Urine:3325]   Intake/Output this shift:  Total I/O In: 360 [P.O.:360] Out: 400 [Urine:400]  JXB:JYNWGNFAOZH sitting up in bed  YQM:VHQIO RRR, normal S1 and S2  Resp: Decreased BS over bases- otherwise CTA  NGE:XBMW, obese, NT, BS normal  Ext: Trace LE edema    Basic Metabolic Panel:  Recent Labs Lab 07/24/12 0700 07/25/12 0610 07/26/12 0754 07/26/12 0917 07/27/12 0607  NA 147* 143 142 143 144  K 4.1 4.4 4.2 4.5 4.1  CL 112 109 109 110 112  CO2 24 23 22 22 22   GLUCOSE 168* 150* 110* 149* 128*  BUN 41* 40* 40* 40* 39*  CREATININE 3.92* 3.80* 3.69* 3.71* 3.83*  CALCIUM 8.3* 8.4 8.4 8.3* 8.2*  PHOS 4.7* 4.8* 5.2*  --  5.2*    Liver Function Tests:  Recent Labs Lab 07/24/12 0700 07/25/12 0610 07/26/12 0754 07/27/12 0607  AST 15  --   --   --   ALT 17  --   --   --   ALKPHOS 53  --   --   --   BILITOT 0.5  --   --   --   PROT 5.7*  --   --   --   ALBUMIN 2.3*  2.3* 2.4* 2.4* 2.4*   No results found for this basename: LIPASE, AMYLASE,  in the last 168 hours No results found for this basename: AMMONIA,  in the last 168 hours  CBC:  Recent Labs Lab 07/22/12 0545 07/23/12 0545 07/24/12 0700  WBC 16.3* 16.0* 16.1*  HGB 8.2* 8.0* 8.3*  HCT 23.8* 23.4* 24.3*  MCV 89.1 89.3 89.0  PLT 253 263 301    Cardiac Enzymes: No results found for this basename: CKTOTAL, CKMB, CKMBINDEX, TROPONINI,  in the last 168 hours  BNP: No  components found with this basename: POCBNP,   CBG:  Recent Labs Lab 07/27/12 0707 07/27/12 1135 07/27/12 1640 07/27/12 2132 07/28/12 0713  GLUCAP 113* 113* 114* 126* 127*    Microbiology: Results for orders placed during the hospital encounter of 07/23/12  URINE CULTURE     Status: None   Collection Time    07/24/12  9:13 PM      Result Value Range Status   Specimen Description URINE, CLEAN CATCH   Final   Special Requests NONE   Final   Culture  Setup Time 07/25/2012 05:14   Final   Colony Count NO GROWTH   Final   Culture NO GROWTH   Final   Report Status 07/26/2012 FINAL   Final    Coagulation Studies:  Recent Labs  07/26/12 0754 07/27/12 0607 07/28/12 0536  LABPROT 24.3* 25.1* 24.8*  INR 2.30* 2.41* 2.37*    Urinalysis: No results found for this basename: COLORURINE, APPERANCEUR, LABSPEC, PHURINE, GLUCOSEU, HGBUR, BILIRUBINUR, KETONESUR, PROTEINUR, UROBILINOGEN, NITRITE, LEUKOCYTESUR,  in the last 72 hours    Imaging: No results found.   Medications:     .  darbepoetin (ARANESP) injection - NON-DIALYSIS  100 mcg Subcutaneous Q Sat-1800  . ferric gluconate (FERRLECIT/NULECIT) IV  250 mg Intravenous Daily  . insulin aspart  0-15 Units Subcutaneous TID WC  . insulin aspart  0-5 Units Subcutaneous QHS  . pantoprazole  40 mg Oral Daily  . polyethylene glycol  17 g Oral Daily  . tamsulosin  0.4 mg Oral Daily  . Warfarin - Pharmacist Dosing Inpatient   Does not apply q1800   acetaminophen, alum & mag hydroxide-simeth, bisacodyl, diphenhydrAMINE, guaiFENesin-dextromethorphan, methocarbamol, ondansetron (ZOFRAN) IV, ondansetron, traMADol, traZODone  Assessment/ Plan:  1. Acute renal failure: Multifactorial-primarily contrast-induced nephropathy and ATN with hypotension and ongoing ARB/ACE inhibitor in this diabetic patient.renal function stable will follow  2. Multiple osseous injury status post fall: Ongoing evaluation and management per physical medicine  and rehabilitation service. Status post open reduction internal fixation.  3. Anemia: Replace iron and start ESA therapy.  4. Leukocytosis: Afebrile and suspected to be primarily the margination status post fall/fractures. Continue to monitor for features that would prompt pancultures.  Will check labs in AM     LOS: 5 Jayden Rudge W @TODAY @10 :12 AM

## 2012-07-29 ENCOUNTER — Inpatient Hospital Stay (HOSPITAL_COMMUNITY): Payer: 59

## 2012-07-29 ENCOUNTER — Inpatient Hospital Stay (HOSPITAL_COMMUNITY): Payer: 59 | Admitting: Occupational Therapy

## 2012-07-29 DIAGNOSIS — W11XXXA Fall on and from ladder, initial encounter: Secondary | ICD-10-CM

## 2012-07-29 DIAGNOSIS — S32409A Unspecified fracture of unspecified acetabulum, initial encounter for closed fracture: Secondary | ICD-10-CM

## 2012-07-29 LAB — RENAL FUNCTION PANEL
Albumin: 2.5 g/dL — ABNORMAL LOW (ref 3.5–5.2)
Calcium: 8.4 mg/dL (ref 8.4–10.5)
GFR calc Af Amer: 20 mL/min — ABNORMAL LOW (ref >90)
GFR calc non Af Amer: 17 mL/min — ABNORMAL LOW (ref >90)
Glucose, Bld: 135 mg/dL — ABNORMAL HIGH (ref 70–99)
Phosphorus: 4.7 mg/dL — ABNORMAL HIGH (ref 2.3–4.6)
Potassium: 3.9 mEq/L (ref 3.5–5.1)
Sodium: 143 mEq/L (ref 135–145)

## 2012-07-29 LAB — PROTIME-INR: INR: 2.59 — ABNORMAL HIGH (ref 0.00–1.49)

## 2012-07-29 LAB — GLUCOSE, CAPILLARY: Glucose-Capillary: 128 mg/dL — ABNORMAL HIGH (ref 70–99)

## 2012-07-29 MED ORDER — WARFARIN SODIUM 5 MG PO TABS
5.0000 mg | ORAL_TABLET | Freq: Once | ORAL | Status: AC
  Administered 2012-07-29: 5 mg via ORAL
  Filled 2012-07-29: qty 1

## 2012-07-29 NOTE — Progress Notes (Signed)
IV infiltrated , RN attempted to start another IV; unsuccessful attempt, IV team unable to start IV, Pam Love PA notified, IV iron unable to infuse d/t no access

## 2012-07-29 NOTE — Progress Notes (Signed)
Physical Therapy Weekly Progress Note  Patient Details  Name: Roger Palmer MRN: [id number] Date of Birth: 1950/05/20  Today's Date: 07/30/2012  Time:830-930 (60 min) Individual therapy; Premedicated for pain. Session focused on d/c planning; wife had completed measurements for house with most doorways measuring 27-28". Currently pt in 20x18 w/c, which would not fit through these doorways. Discussed variable options for set up changes or possibility of needing to do a home evaluation. Need to clarify with wife if measured accurately. Changed w/c to 18x18 to determine if pt able to be comfortable in during the day as this would fit through doorways in the pt's home. Multiple sit to stands with S using sink to change out w/c and cushions. Simulated car transfer with steady A x 2 reps squat/stand pivot technique; used leg lifter to independently manage LE's into/out of the car. Practiced bed mobility on soft bed in ADL apartment with steady A for transfers and S for bed mobility using the leg lifter. Propelled w/c mod I on unit for overall endurance.   Patient has met 3 of 3 short term goals. Pt has been making excellent progress in the last week. Pt is at an overall min A for basic transfers and has progressed from using the slide board to squat pivot transfers. Continuing to work on sit to stands and standing balance as well as bed mobility (using a leg lifter to aid with management of RLE). Pt adheres to WB restrictions with mobility and requires set up of w/c parts on R side due to RUE restrictions. Family is in the process of building a ramp for home entry access.   Patient continues to demonstrate the following deficits: endurance, strength, functional mobility, ROM, balance and therefore will continue to benefit from skilled PT intervention to enhance overall performance with activity tolerance, balance, ability to compensate for deficits, functional use of  right lower extremity and knowledge of  precautions.  Patient progressing toward long term goals..  Continue plan of care.  PT Short Term Goals Week 1:  PT Short Term Goal 1 (Week 1): Pt will be able to complete basic transfers with min A PT Short Term Goal 1 - Progress (Week 1): Met PT Short Term Goal 2 (Week 1): Pt will be able to propel w/c x 100' on unit with S PT Short Term Goal 2 - Progress (Week 1): Met PT Short Term Goal 3 (Week 1): Pt will be able to complete sit to stand with mod A  PT Short Term Goal 3 - Progress (Week 1): Met Week 2:  PT Short Term Goal 1 (Week 2): = LTGS  Skilled Therapeutic Interventions/Progress Updates:  Ambulation/gait training;Balance/vestibular training;Community reintegration;Discharge planning;DME/adaptive equipment instruction;Functional mobility training;Neuromuscular re-education;Pain management;Patient/family education;Psychosocial support;Skin care/wound management;Splinting/orthotics;Therapeutic Activities;Therapeutic Exercise;UE/LE Strength taining/ROM;UE/LE Coordination activities;Wheelchair propulsion/positioning   Therapy Documentation Precautions:  Precautions Precautions: Fall Restrictions Weight Bearing Restrictions: Yes RUE Weight Bearing: Non weight bearing RLE Weight Bearing: Non weight bearing Other Position/Activity Restrictions: Gentle PROM Rt. elbow and wrist per Montez Morita, PA order 07/22/12; in chart prior to CIR eval  See FIM for current functional status  Therapy/Group: Individual Therapy  Karolee Stamps [facility] 07/29/2012, 4:27 PM

## 2012-07-29 NOTE — Progress Notes (Signed)
Subjective/Complaints: Intermittent pain issues. Slept well. A 12 point review of systems has been performed and if not noted above is otherwise negative.   Objective: Vital Signs: Blood pressure 144/77, pulse 82, temperature 98 F (36.7 C), temperature source Oral, resp. rate 18, height 5\' 9"  (1.753 m), weight 109.907 kg (242 lb 4.8 oz), SpO2 96.00%. No results found. No results found for this basename: WBC, HGB, HCT, PLT,  in the last 72 hours  Recent Labs  07/27/12 0607 07/29/12 0610  NA 144 143  K 4.1 3.9  CL 112 111  GLUCOSE 128* 135*  BUN 39* 39*  CREATININE 3.83* 3.55*  CALCIUM 8.2* 8.4   CBG (last 3)   Recent Labs  07/28/12 1652 07/28/12 2112 07/29/12 0739  GLUCAP 101* 154* 130*    Wt Readings from Last 3 Encounters:  07/29/12 109.907 kg (242 lb 4.8 oz)  07/12/12 102.059 kg (225 lb)  07/12/12 102.059 kg (225 lb)    Physical Exam:  Constitutional: He is oriented to person, place, and time. He appears well-developed and well-nourished.  Flat affect, no distress  HENT:  Head: Normocephalic and atraumatic.  Eyes: Pupils are equal, round, and reactive to light.  Neck: Normal range of motion.  Cardiovascular: Normal rate and regular rhythm.  Pulmonary/Chest: Effort normal and breath sounds normal.  Abdominal: He exhibits distension. There is no tenderness.  + BS.   Genitourinary:  1 to 2+ scrotal edema Musculoskeletal: He exhibits edema.  RUE-1+ edema at hand and dependently at elbow.right arm dressed. Arm not seated in splint and is at a 120 degree angle, tender with touch, rom. RLE- Incision right groin and With sutures in place--slight drainage without erythema. Abraded lacerated area right lateral thigh with moderate amount of serous drainage. 2+ pitting edema RLE. 1+ edema LLE.  Neurological: He is alert and oriented to person, place, and time.  Speech measured at times but better. Follows basic commands without difficulty.    Skin: Skin is warm. See  above Psychiatric: Thought content normal. His affect is blunt. He is slowed at times. Cognition and memory are fair. Reasonable insight and awareness In motor strength is 5/5 in the left deltoid, biceps, grip left lower extremity 4 minus at the hip flexor knee extensor ankle dorsiflexor plantar flexor  Right lower extremity 3 minus hip flexor knee extensor 4 minus ankle dorsiflexor plantar flexor  Right upper extremity is in splint,     Assessment/Plan: 1. Functional deficits secondary to complex right hemipelvis fx, right elbow dislocation with distal radius fx, mild TBI which require 3+ hours per day of interdisciplinary therapy in a comprehensive inpatient rehab setting. Physiatrist is providing close team supervision and 24 hour management of active medical problems listed below. Physiatrist and rehab team continue to assess barriers to discharge/monitor patient progress toward functional and medical goals. FIM: FIM - Bathing Bathing Steps Patient Completed: Chest;Right Arm;Left Arm;Abdomen Bathing: 5: Supervision: Safety issues/verbal cues (supervision for UB bathing, total A for LB bathing)  FIM - Upper Body Dressing/Undressing Upper body dressing/undressing steps patient completed: Thread/unthread right sleeve of pullover shirt/dresss;Thread/unthread left sleeve of pullover shirt/dress;Put head through opening of pull over shirt/dress;Pull shirt over trunk Upper body dressing/undressing: 5: Supervision: Safety issues/verbal cues (total assist to donn sling) FIM - Lower Body Dressing/Undressing Lower body dressing/undressing steps patient completed: Thread/unthread right pants leg;Thread/unthread left pants leg Lower body dressing/undressing: 2: Max-Patient completed 25-49% of tasks  FIM - Hotel manager Devices: Grab bar or rail for support Toileting: 0: Activity  did not occur  FIM - Diplomatic Services operational officer Devices: Bedside commode;Grab  bars Toilet Transfers: 0-Activity did not occur  FIM - Banker Devices:  (leg lifter) Bed/Chair Transfer: 5: Supine > Sit: Supervision (verbal cues/safety issues);4: Bed > Chair or W/C: Min A (steadying Pt. > 75%)  FIM - Locomotion: Wheelchair Distance: 200 Locomotion: Wheelchair: 5: Travels 150 ft or more: maneuvers on rugs and over door sills with supervision, cueing or coaxing FIM - Locomotion: Ambulation Ambulation/Gait Assistance: Not tested (comment) (unsafe at eval due to NWB RLE and RUE) Locomotion: Ambulation: 0: Activity did not occur  Comprehension Comprehension Mode: Auditory Comprehension: 5-Understands complex 90% of the time/Cues < 10% of the time  Expression Expression Mode: Verbal Expression: 5-Expresses complex 90% of the time/cues < 10% of the time  Social Interaction Social Interaction Mode: Asleep Social Interaction: 6-Interacts appropriately with others with medication or extra time (anti-anxiety, antidepressant).  Problem Solving Problem Solving Mode: Asleep Problem Solving: 5-Solves complex 90% of the time/cues < 10% of the time  Memory Memory Mode: Asleep Memory: 6-More than reasonable amt of time  Medical Problem List and Plan:  1. DVT Prophylaxis/Anticoagulation: Pharmaceutical: Coumadin. Lovenox discontinued as INR therapeutic.  2. Pain Management: Will continue prn ultram.  3. Mood:  flat  4. Neuropsych: This patient is capable of making decisions on his/her own behalf.  5. Right anterior column/anterior wall acetabular Fx: No formal hip precautions. NWB if unable to TDWB RLE.   -dc'ed sutures from hypogastric area 6. Right distal humerus and Right distal radius fractue: NWB.  -adjustments in splint have held arm in place  -xrays last week showed good position.  -dry dressing for drainage. 7. Leucocytosis: Antibiotics discontinued. Will monitor for temperature curve for fevers. Continue to monitor  wounds. Recheck cbc tomorow. Urine culture negative 8. Post op ileus: Continue reglan. Added miralax to help with bowel program. Suppository prn no BM in 24 hours.  9. Acute renal failure/Metabolic acidosis: continue per renal recs 10. DM Type 2: Will continue to monitor with AC/HS cbg checks. Hgb A1C @6 .5. Elevated BS likely due to stress. SSI for elevated BS and titrate medications as indicated. Off metformin due to renal failure. added Amaryl / off lantus.  -fair control at present  11. GU: Incontinent of urine due to IVF and well as scrotal edema/penis retraction.  check PVRs.   UA/UCS negative  -elevate scrotum  LOS (Days) 6 A FACE TO FACE EVALUATION WAS PERFORMED  SWARTZ,ZACHARY T 07/29/2012 9:49 AM

## 2012-07-29 NOTE — Progress Notes (Signed)
Physical Therapy Session Note  Patient Details  Name: Roger Palmer MRN: [id number] Date of Birth: [birth year]  Today's Date: 07/29/2012 Time: 0930-1030 Time Calculation (min): 60 min  Short Term Goals: Week 1:  PT Short Term Goal 1 (Week 1): Pt will be able to complete basic transfers with min A PT Short Term Goal 2 (Week 1): Pt will be able to propel w/c x 100' on unit with S PT Short Term Goal 3 (Week 1): Pt will be able to complete sit to stand with mod A   Skilled Therapeutic Interventions/Progress Updates:    w/c propulsion down to therapy gym for endurance and strengthening using forward L hemi technique. Uphill squat pivot transfer to mat to the L with steady A; assist needed for R side w/c parts management only. Supine to sit with steady A using leg lifter for RLE. Completed supine therex for LE strengthening including heel slides, SAQ, hip abduction, and quad/glut sets x 10 reps x 3 sets each with AAROM needed on RLE. Steady A transfer back to w/c and propelled w/c back to room with S for endurance and strengthening.   Therapy Documentation Precautions:  Precautions Precautions: Fall Restrictions Weight Bearing Restrictions: Yes RUE Weight Bearing: Non weight bearing RLE Weight Bearing: Non weight bearing Other Position/Activity Restrictions: Gentle PROM Rt. elbow and wrist per Montez Morita, PA order 07/22/12; in chart prior to CIR eval   Pain:  Declines need for pain medicine at this time.  See FIM for current functional status  Therapy/Group: Individual Therapy  Karolee Stamps [facility] 07/29/2012, 11:35 AM

## 2012-07-29 NOTE — Progress Notes (Signed)
ANTICOAGULATION CONSULT NOTE - Follow Up Consult  Pharmacy Consult for coumadin Indication: VTE prophylaxis  No Known Allergies  Patient Measurements: Height: 5\' 9"  (175.3 cm) Weight: 242 lb 4.8 oz (109.907 kg) IBW/kg (Calculated) : 70.7 Heparin Dosing Weight:   Vital Signs: Temp: 98 F (36.7 C) (03/19 0503) Temp src: Oral (03/19 0503) BP: 144/77 mmHg (03/19 0503) Pulse Rate: 82 (03/19 0503)  Labs:  Recent Labs  07/26/12 0917 07/27/12 0607 07/28/12 0536 07/29/12 0610  LABPROT  --  25.1* 24.8* 26.5*  INR  --  2.41* 2.37* 2.59*  CREATININE 3.71* 3.83*  --  3.55*    Estimated Creatinine Clearance: 26.4 ml/min (by C-G formula based on Cr of 3.55).   Medications:  Scheduled:  . darbepoetin (ARANESP) injection - NON-DIALYSIS  100 mcg Subcutaneous Q Sat-1800  . ferric gluconate (FERRLECIT/NULECIT) IV  250 mg Intravenous Daily  . insulin aspart  0-15 Units Subcutaneous TID WC  . insulin aspart  0-5 Units Subcutaneous QHS  . pantoprazole  40 mg Oral Daily  . polyethylene glycol  17 g Oral Daily  . tamsulosin  0.4 mg Oral Daily  . [COMPLETED] warfarin  5 mg Oral ONCE-1800  . warfarin  5 mg Oral ONCE-1800  . Warfarin - Pharmacist Dosing Inpatient   Does not apply q1800   Infusions:    Assessment: 62 yo male s/p ortho surgery is currently on therapeutic coumadin.  INR is 2.59 today. Goal of Therapy:  INR 2-3    Plan:  1) Coumadin 5mg  po x1 2) INR in am.  Javia Dillow, [facility] 07/29/2012,8:33 AM

## 2012-07-29 NOTE — Progress Notes (Signed)
Occupational Therapy Session Notes  Patient Details  Name: Roger Palmer MRN: [id number] Date of Birth: 21-Dec-1950  Today's Date: 07/29/2012  Short Term Goals: Week 1:  OT Short Term Goal 1 (Week 1): Patient will perform UB dressing with moderate assistance OT Short Term Goal 2 (Week 1): Patient will perform LB dressing with maximal assistance OT Short Term Goal 3 (Week 1): Patient will complete grooming tassk of brushing teeth, washing face, and washing hands at sink with minimal assistance OT Short Term Goal 4 (Week 1): Patient will perform toilet transfer with moderate assistance  Skilled Therapeutic Interventions/Progress Updates:   Session #1 [phone] - 55 Minutes Individual Therapy No complaints of pain Patient found supine in bed. Patient engaged in bed mobility -> left side using leg lifter with supervision, HOB flat and no bed rails. Patient then transferred edge of bed -> w/c with steady assist. Patient then sat at sink for UB/LB bathing & dressing in sit<>stand position. RN present to check sacrum area/skin breakdown. Patient also sat at sink for grooming tasks of washing face, hands, hair, shaving, and brushing teeth. At end of session left patient seated in w/c beside bed with call bell, phone, and breakfast tray within reach.   Session #2 [id number] - 42 Minutes Individual Therapy No complaints of pain Patient found seated in w/c upon entering room. Patient stated he needed to use restroom. Patient propelled self into bathroom with min assist for elevated toilet seat transfer with minimal assistance using grab bars. Patient then performed toileting tasks of peri care and clothing management with maximal assistance (patient has reached his highest potential with toileting tasks secondary to NWB -> RUE & RLE). Patient then transferred back to w/c, sat at sink to wash hands, then therapist propelled patient -> ADL apartment. Once in apartment patient engaged in simulated  tub/shower transfer on/off tub transfer bench with steady assist in/out of tub, patient also used leg lifter to increase independence and assist with getting RLE in/out of tub. From there patient propelled self back to room and therapist left patient seated in w/c beside bed with call bell & phone within reach. Notified RN to donn new dressing to sacrum secondary to removing for bowel movement.   Precautions:  Precautions Precautions: Fall Restrictions Weight Bearing Restrictions: Yes RUE Weight Bearing: Non weight bearing RLE Weight Bearing: Non weight bearing Other Position/Activity Restrictions: Gentle PROM Rt. elbow and wrist per Montez Morita, PA order 07/22/12; in chart prior to CIR eval  See FIM for current functional status  Maryjayne Kleven 07/29/2012, 7:30 AM

## 2012-07-29 NOTE — Progress Notes (Signed)
Social Work Patient ID: Roger Palmer, male   DOB: 03-Mar-1951, 62 y.o.   MRN: [id number]  Met with patient yesterday to review team conference - agreeable with targeted d/c 3/27 at superivion to mod assist goals (UE/ADLS).  Pt pleased with gains to date and optimistic about continued improvement.  No concerns at this time.  Continue to follow.  Roger Zayas, LCSW

## 2012-07-29 NOTE — Progress Notes (Signed)
Physical Therapy Session Note  Patient Details  Name: Roger Palmer MRN: [id number] Date of Birth: 02-Dec-1950  Today's Date: 07/29/2012 Time: 1530-1600 Time Calculation (min): 30 min  Short Term Goals: Week 1:  PT Short Term Goal 1 (Week 1): Pt will be able to complete basic transfers with min A PT Short Term Goal 2 (Week 1): Pt will be able to propel w/c x 100' on unit with S PT Short Term Goal 3 (Week 1): Pt will be able to complete sit to stand with mod A   Skilled Therapeutic Interventions/Progress Updates:    Session focused on bed mobility with leg lifter, squat/stand pivot transfers bed <-> w/c with steady A, and sit to stands/standing tolerance/balance from edge of mat with close S/steady A. Pt able to tolerate standing 2:30 min and then 1:45 min before fatigue with close S using hemiwalker. Self propelled w/c to/from therapy for endurance and strengthening.   Therapy Documentation Precautions:  Precautions Precautions: Fall Restrictions Weight Bearing Restrictions: Yes RUE Weight Bearing: Non weight bearing RLE Weight Bearing: Non weight bearing Other Position/Activity Restrictions: Gentle PROM Rt. elbow and wrist per Montez Morita, PA order 07/22/12; in chart prior to CIR eval  Pain:  No complaints.  See FIM for current functional status  Therapy/Group: Individual Therapy  Karolee Stamps [facility] 07/29/2012, 4:16 PM

## 2012-07-29 NOTE — Progress Notes (Signed)
[facility] ROUNDING NOTE   Subjective:   Interval History: clinically bettter  Objective:  Vital signs in last 24 hours:  Temp:  [97.5 F (36.4 C)-98 F (36.7 C)] 98 F (36.7 C) (03/19 0503) Pulse Rate:  [82-85] 82 (03/19 0503) Resp:  [18] 18 (03/19 0503) BP: (144-165)/(77-99) 144/77 mmHg (03/19 0503) SpO2:  [96 %] 96 % (03/19 0503) Weight:  [109.907 kg (242 lb 4.8 oz)] 109.907 kg (242 lb 4.8 oz) (03/19 0503)  Weight change:  Filed Weights   07/24/12 0500 07/29/12 0503  Weight: 114 kg (251 lb 5.2 oz) 109.907 kg (242 lb 4.8 oz)    Intake/Output: I/O last 3 completed shifts: In: 1080 [P.O.:1080] Out: 4300 [Urine:4300]   Intake/Output this shift:     ZOX:[id number] sitting up in bed  XBJ:YNWGN RRR, normal S1 and S2  Resp: Decreased BS over bases- otherwise CTA  FAO:ZHYQ, obese, NT, BS normal  Ext: Trace LE edema    Basic Metabolic Panel:  Recent Labs Lab 07/24/12 0700 07/25/12 0610 07/26/12 0754 07/26/12 0917 07/27/12 0607 07/29/12 0610  NA 147* 143 142 143 144 143  K 4.1 4.4 4.2 4.5 4.1 3.9  CL 112 109 109 110 112 111  CO2 24 23 22 22 22 22   GLUCOSE 168* 150* 110* 149* 128* 135*  BUN 41* 40* 40* 40* 39* 39*  CREATININE 3.92* 3.80* 3.69* 3.71* 3.83* 3.55*  CALCIUM 8.3* 8.4 8.4 8.3* 8.2* 8.4  PHOS 4.7* 4.8* 5.2*  --  5.2* 4.7*    Liver Function Tests:  Recent Labs Lab 07/24/12 0700 07/25/12 0610 07/26/12 0754 07/27/12 0607 07/29/12 0610  AST 15  --   --   --   --   ALT 17  --   --   --   --   ALKPHOS 53  --   --   --   --   BILITOT 0.5  --   --   --   --   PROT 5.7*  --   --   --   --   ALBUMIN 2.3*  2.3* 2.4* 2.4* 2.4* 2.5*   No results found for this basename: LIPASE, AMYLASE,  in the last 168 hours No results found for this basename: AMMONIA,  in the last 168 hours  CBC:  Recent Labs Lab 07/23/12 0545 07/24/12 0700  WBC 16.0* 16.1*  HGB 8.0* 8.3*  HCT 23.4* 24.3*  MCV 89.3 89.0  PLT 263 301    Cardiac  Enzymes: No results found for this basename: CKTOTAL, CKMB, CKMBINDEX, TROPONINI,  in the last 168 hours  BNP: No components found with this basename: POCBNP,   CBG:  Recent Labs Lab 07/28/12 0713 07/28/12 1120 07/28/12 1652 07/28/12 2112 07/29/12 0739  GLUCAP 127* 129* 101* 154* 130*    Microbiology: Results for orders placed during the hospital encounter of 07/23/12  URINE CULTURE     Status: None   Collection Time    07/24/12  9:13 PM      Result Value Range Status   Specimen Description URINE, CLEAN CATCH   Final   Special Requests NONE   Final   Culture  Setup Time 07/25/2012 05:14   Final   Colony Count NO GROWTH   Final   Culture NO GROWTH   Final   Report Status 07/26/2012 FINAL   Final    Coagulation Studies:  Recent Labs  07/27/12 0607 07/28/12 0536 07/29/12 0610  LABPROT 25.1* 24.8* 26.5*  INR 2.41* 2.37* 2.59*  Urinalysis: No results found for this basename: COLORURINE, APPERANCEUR, LABSPEC, PHURINE, GLUCOSEU, HGBUR, BILIRUBINUR, KETONESUR, PROTEINUR, UROBILINOGEN, NITRITE, LEUKOCYTESUR,  in the last 72 hours    Imaging: No results found.   Medications:     . darbepoetin (ARANESP) injection - NON-DIALYSIS  100 mcg Subcutaneous Q Sat-1800  . insulin aspart  0-15 Units Subcutaneous TID WC  . insulin aspart  0-5 Units Subcutaneous QHS  . pantoprazole  40 mg Oral Daily  . polyethylene glycol  17 g Oral Daily  . tamsulosin  0.4 mg Oral Daily  . warfarin  5 mg Oral ONCE-1800  . Warfarin - Pharmacist Dosing Inpatient   Does not apply q1800   acetaminophen, alum & mag hydroxide-simeth, bisacodyl, diphenhydrAMINE, guaiFENesin-dextromethorphan, methocarbamol, ondansetron (ZOFRAN) IV, ondansetron, traMADol, traZODone  Assessment/ Plan:  1. Acute renal failure: Multifactorial-primarily contrast-induced nephropathy and ATN with hypotension and ongoing ARB/ACE inhibitor in this diabetic patient.renal function stable will follow  2. Multiple osseous  injury status post fall: Ongoing evaluation and management per physical medicine and rehabilitation service. Status post open reduction internal fixation.  3. Anemia: Replace iron and start ESA therapy.   Appears to be mobilizing edema and creatinine slowly improving   LOS: 6 Roger Palmer @TODAY @10 :19 AM

## 2012-07-29 NOTE — Patient Care Conference (Signed)
Inpatient RehabilitationTeam Conference and Plan of Care Update Date: 07/28/2012   Time: 2:00 PM    Patient Name: Roger Palmer      Medical Record Number: [id number]  Date of Birth: 10/27/1950 Sex: Male         Room/Bed: 4038/4038-01 Payor Info: Payor: Advertising copywriter  Plan: [employer]  Product Type: *No Product type*     Admitting Diagnosis: Polytrauma  Admit Date/Time:  07/23/2012  5:48 PM Admission Comments: No comment available   Primary Diagnosis:  Fall from tree Principal Problem: Fall from tree  Patient Active Problem List   Diagnosis Date Noted  . 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 Ileus 07/23/2012  . Acute kidney injury 07/20/2012  . Hypernatremia 07/20/2012  . Aspiration pneumonia 07/20/2012  . Acute respiratory failure following trauma and surgery 07/16/2012  . Acute blood loss anemia 07/14/2012  . Hypokalemia 07/14/2012  . Fall from tree 07/13/2012  . Hypercholesteremia   . Nephrolithiasis   . Hypertension   . Back pain   . Obesity   . BPH (benign prostatic hypertrophy)   . Gastritis   . Reflux   . Diabetes mellitus without complication   . Right acetabular fracture 07/11/2012  . Open dislocation of right elbow 07/11/2012  . Distal radius fracture, right 07/11/2012  . Fracture of iliac wing 07/11/2012  . Pubic ramus fracture 07/11/2012    Expected Discharge Date: Expected Discharge Date: 08/06/12  Team Members Present: Physician leading conference: Dr. Faith Rogue Social Worker Present: Amada Jupiter, LCSW Nurse Present: Daryll Brod, RN PT Present: Karolee Stamps, PT OT Present: Edwin Cap, Loistine Chance, OT Other (Discipline and Name): Tora Duck, PPS Coordinator     Current Status/Progress Goal Weekly Team Focus  Medical   TBI, right acetab fx, radius fx, elbow disloc  improve mobility with ROM precautions,   control pain, work around sling   Bowel/Bladder   Continent of bowel and bladder. LBM 07/26/12  Pt to  remain continent of bowel and bladder  Monitor    Swallow/Nutrition/ Hydration             ADL's   max for UB/LB dressing, min-mod assist for transfers,   overall supervision-min assist  ADL retraining, use of AE prn, sit<>stands, transfers, overall activity tolerance/endurance   Mobility   min A with transfers; S w/c mobility; min/mod A standing from elevated surface  S transfers, mod I bed mobility and w/c mobility, min A standing balance  endurance, functional strengthening, transfers, standing balance, w/c mobility   Communication             Safety/Cognition/ Behavioral Observations            Pain   Robaxin 500mg  q 6hrs prn, Tramadol 50mg  q 6hrs prn  <3  offer pain medicaiton 1hr prior to therapy session   Skin   Bruising to L upper hip. R buttock with pink, peeling skin. Cast to R arm   No additional skin breakdown  Monitor for appropriate healing    Rehab Goals Patient on target to meet rehab goals: Yes *See Interdisciplinary Assessment and Plan and progress notes for long and short-term goals  Barriers to Discharge: right elbow splint, ortho precautions,    Possible Resolutions to Barriers:  family ed, adaptive equipment    Discharge Planning/Teaching Needs:  Home with wife to provide 24/7 assistance      Team Discussion:  Making gains and now working on squat - pivot transfers.  ADLs more  difficult due to UE limitations.  Anticipate reaching supervision transfer goals with wife assisting with set-up.  Revisions to Treatment Plan:  OT ADL goals downgraded to min-mod A   Continued Need for Acute Rehabilitation Level of Care: The patient requires daily medical management by a physician with specialized training in physical medicine and rehabilitation for the following conditions: Daily direction of a multidisciplinary physical rehabilitation program to ensure safe treatment while eliciting the highest outcome that is of practical value to the patient.: Yes Daily  medical management of patient stability for increased activity during participation in an intensive rehabilitation regime.: Yes Daily analysis of laboratory values and/or radiology reports with any subsequent need for medication adjustment of medical intervention for : Post surgical problems;Other;Neurological problems  Kaitrin Seybold 07/29/2012, 3:50 PM

## 2012-07-30 ENCOUNTER — Inpatient Hospital Stay (HOSPITAL_COMMUNITY): Payer: 59

## 2012-07-30 ENCOUNTER — Inpatient Hospital Stay (HOSPITAL_COMMUNITY): Payer: 59 | Admitting: Occupational Therapy

## 2012-07-30 LAB — GLUCOSE, CAPILLARY: Glucose-Capillary: 93 mg/dL (ref 70–99)

## 2012-07-30 LAB — PROTIME-INR
INR: 2.59 — ABNORMAL HIGH (ref 0.00–1.49)
Prothrombin Time: 26.5 seconds — ABNORMAL HIGH (ref 11.6–15.2)

## 2012-07-30 LAB — CBC
HCT: 24.8 % — ABNORMAL LOW (ref 39.0–52.0)
MCHC: 33.5 g/dL (ref 30.0–36.0)
Platelets: 258 10*3/uL (ref 150–400)
RDW: 16 % — ABNORMAL HIGH (ref 11.5–15.5)
WBC: 11 10*3/uL — ABNORMAL HIGH (ref 4.0–10.5)

## 2012-07-30 LAB — RENAL FUNCTION PANEL
Albumin: 2.7 g/dL — ABNORMAL LOW (ref 3.5–5.2)
Calcium: 8.4 mg/dL (ref 8.4–10.5)
GFR calc Af Amer: 22 mL/min — ABNORMAL LOW (ref >90)
GFR calc non Af Amer: 19 mL/min — ABNORMAL LOW (ref >90)
Glucose, Bld: 126 mg/dL — ABNORMAL HIGH (ref 70–99)
Phosphorus: 4.3 mg/dL (ref 2.3–4.6)
Potassium: 3.9 mEq/L (ref 3.5–5.1)
Sodium: 143 mEq/L (ref 135–145)

## 2012-07-30 MED ORDER — CIPROFLOXACIN HCL 250 MG PO TABS
250.0000 mg | ORAL_TABLET | Freq: Two times a day (BID) | ORAL | Status: DC
  Administered 2012-07-30 – 2012-07-31 (×3): 250 mg via ORAL
  Filled 2012-07-30 (×5): qty 1

## 2012-07-30 MED ORDER — WARFARIN SODIUM 5 MG PO TABS
5.0000 mg | ORAL_TABLET | Freq: Once | ORAL | Status: AC
  Administered 2012-07-30: 5 mg via ORAL
  Filled 2012-07-30: qty 1

## 2012-07-30 NOTE — Progress Notes (Signed)
Subjective/Complaints: Pain under control. Wounds still draining, particularly elbow A 12 point review of systems has been performed and if not noted above is otherwise negative.   Objective: Vital Signs: Blood pressure 147/70, pulse 83, temperature 98 F (36.7 C), temperature source Oral, resp. rate 18, height 5\' 9"  (1.753 m), weight 108.5 kg (239 lb 3.2 oz), SpO2 97.00%. No results found.  Recent Labs  07/30/12 0610  WBC 11.0*  HGB 8.3*  HCT 24.8*  PLT 258    Recent Labs  07/29/12 0610 07/30/12 0610  NA 143 143  K 3.9 3.9  CL 111 110  GLUCOSE 135* 126*  BUN 39* 37*  CREATININE 3.55* 3.26*  CALCIUM 8.4 8.4   CBG (last 3)   Recent Labs  07/29/12 1634 07/29/12 2138 07/30/12 0709  GLUCAP 128* 110* 117*    Wt Readings from Last 3 Encounters:  07/30/12 108.5 kg (239 lb 3.2 oz)  07/12/12 102.059 kg (225 lb)  07/12/12 102.059 kg (225 lb)    Physical Exam:  Constitutional: He is oriented to person, place, and time. He appears well-developed and well-nourished.  Flat affect, no distress  HENT:  Head: Normocephalic and atraumatic.  Eyes: Pupils are equal, round, and reactive to light.  Neck: Normal range of motion.  Cardiovascular: Normal rate and regular rhythm.  Pulmonary/Chest: Effort normal and breath sounds normal.  Abdominal: He exhibits distension. There is no tenderness.  + BS.   Genitourinary:  1 to 2+ scrotal edema,decreased Musculoskeletal: He exhibits edema.  RUE-1+ edema at hand and dependently at elbow.right arm dressed. Elbow wound with foul smell and greenish-yellow dc. Wound a little macerated and with fibrinous tissue centrally. Abraded lacerated area right lateral thigh with moderate amount of serous drainage. 2+ pitting edema RLE. 1+ edema LLE.  Neurological: He is alert and oriented to person, place, and time.  Speech measured at times but better. Follows basic commands without difficulty.    Skin: Skin is warm. See above Psychiatric:  Thought content normal. His affect is blunt. He is slowed at times. Cognition and memory are fair. Reasonable insight and awareness In motor strength is 5/5 in the left deltoid, biceps, grip left lower extremity 4 minus at the hip flexor knee extensor ankle dorsiflexor plantar flexor  Right lower extremity 3 minus hip flexor knee extensor 4 minus ankle dorsiflexor plantar flexor  Right upper extremity is in splint,     Assessment/Plan: 1. Functional deficits secondary to complex right hemipelvis fx, right elbow dislocation with distal radius fx, mild TBI which require 3+ hours per day of interdisciplinary therapy in a comprehensive inpatient rehab setting. Physiatrist is providing close team supervision and 24 hour management of active medical problems listed below. Physiatrist and rehab team continue to assess barriers to discharge/monitor patient progress toward functional and medical goals. FIM: FIM - Bathing Bathing Steps Patient Completed: Chest;Right Arm;Left Arm;Abdomen Bathing: 5: Supervision: Safety issues/verbal cues (supervision for UB bathing, total A for LB bathing)  FIM - Upper Body Dressing/Undressing Upper body dressing/undressing steps patient completed: Thread/unthread right sleeve of pullover shirt/dresss;Thread/unthread left sleeve of pullover shirt/dress;Put head through opening of pull over shirt/dress;Pull shirt over trunk Upper body dressing/undressing: 5: Supervision: Safety issues/verbal cues (total assist to donn sling) FIM - Lower Body Dressing/Undressing Lower body dressing/undressing steps patient completed: Thread/unthread right pants leg;Thread/unthread left pants leg Lower body dressing/undressing: 2: Max-Patient completed 25-49% of tasks  FIM - Toileting Toileting steps completed by patient: Performs perineal hygiene Toileting Assistive Devices: Grab bar or rail for  support Toileting: 2: Max-Patient completed 1 of 3 steps  FIM - Quarry manager Devices: Elevated toilet seat;Grab bars Toilet Transfers: 4-To toilet/BSC: Min A (steadying Pt. > 75%);4-From toilet/BSC: Min A (steadying Pt. > 75%)  FIM - Bed/Chair Transfer Bed/Chair Transfer Assistive Devices:  (leg lifter) Bed/Chair Transfer: 4: Supine > Sit: Min A (steadying Pt. > 75%/lift 1 leg);4: Sit > Supine: Min A (steadying pt. > 75%/lift 1 leg);4: Bed > Chair or W/C: Min A (steadying Pt. > 75%);4: Chair or W/C > Bed: Min A (steadying Pt. > 75%)  FIM - Locomotion: Wheelchair Distance: 200 Locomotion: Wheelchair: 5: Travels 150 ft or more: maneuvers on rugs and over door sills with supervision, cueing or coaxing FIM - Locomotion: Ambulation Ambulation/Gait Assistance: Not tested (comment) (unsafe at eval due to NWB RLE and RUE) Locomotion: Ambulation: 0: Activity did not occur  Comprehension Comprehension Mode: Auditory Comprehension: 5-Understands complex 90% of the time/Cues < 10% of the time  Expression Expression Mode: Verbal Expression: 5-Expresses complex 90% of the time/cues < 10% of the time  Social Interaction Social Interaction Mode: Asleep Social Interaction: 6-Interacts appropriately with others with medication or extra time (anti-anxiety, antidepressant).  Problem Solving Problem Solving Mode: Asleep Problem Solving: 5-Solves complex 90% of the time/cues < 10% of the time  Memory Memory Mode: Asleep Memory: 6-More than reasonable amt of time  Medical Problem List and Plan:  1. DVT Prophylaxis/Anticoagulation: Pharmaceutical: Coumadin. Lovenox discontinued as INR therapeutic.  2. Pain Management: Will continue prn ultram.  3. Mood:  flat  4. Neuropsych: This patient is capable of making decisions on his/her own behalf.  5. Right anterior column/anterior wall acetabular Fx: No formal hip precautions. NWB if unable to TDWB RLE.   -dc'ed sutures from hypogastric area 6. Right distal humerus and Right distal radius fractue:  NWB.  -adjustments in splint have held arm in place  -xrays last week showed good position.  -cipro/dressing changes as below. 7. Leucocytosis: decreased counts, but---  -foul smelling, greenish drainage from one wound at right elbow  -begin cipro, dressing change  -ortho contacted 8. Post op ileus: Continue reglan. Added miralax to help with bowel program. Suppository prn no BM in 24 hours.  9. Acute renal failure/Metabolic acidosis: continue per renal recs 10. DM Type 2: Will continue to monitor with AC/HS cbg checks. Hgb A1C @6 .5. Elevated BS likely due to stress. SSI for elevated BS and titrate medications as indicated. Off metformin due to renal failure. added Amaryl / off lantus.  -fair control at present  11. GU: Incontinent of urine due to IVF and well as scrotal edema/penis retraction.  check PVRs.   UA/UCS negative  -elevate scrotum  LOS (Days) 7 A FACE TO FACE EVALUATION WAS PERFORMED  SWARTZ,ZACHARY T 07/30/2012 9:01 AM

## 2012-07-30 NOTE — Progress Notes (Signed)
Occupational Therapy Weekly Progress Note & Session Notes  Patient Details  Name: Roger Palmer MRN: [id number] Date of Birth: Mar 30, 1951  Today's Date: 07/30/2012  WEEKLY PROGRESS NOTES Patient has met 4 of 4 short term goals.  Patient is making steady progress on CIR. Patient is motivated and optimistic about gains thus far. Patient can be supervision with UB dressing (with large enough shirt), max assist for LB dressing, max assist for toileting (reached his potential for toileting while on CIR secondary to NWB -> RLE&RUE), and min assist for transfers. Patient's wife/family has not yet be in for family education, will need to arrange family education secondary to patient's goals/level of assistance needed at d/c.   Patient continues to demonstrate the following deficits: decreased independence with ADLs, decreased independence with bed mobility, decreased independence with BADLs & IADLs, decreased overall activity tolerance/endurance, decreased functional use of RUE & RLE, decreased dynamic standing balance/tolerance/endurance. Therefore, will continue to benefit from skilled OT intervention to enhance overall performance with BADL, iADL and Reduce care partner burden.  Patient not progressing toward long term goals.  See goal revision..  Plan of care revisions: See Care Plan/Paths.  OT Short Term Goals Week 1:  OT Short Term Goal 1 (Week 1): Patient will perform UB dressing with moderate assistance OT Short Term Goal 1 - Progress (Week 1): Met OT Short Term Goal 2 (Week 1): Patient will perform LB dressing with maximal assistance OT Short Term Goal 2 - Progress (Week 1): Met OT Short Term Goal 3 (Week 1): Patient will complete grooming tassk of brushing teeth, washing face, and washing hands at sink with minimal assistance OT Short Term Goal 3 - Progress (Week 1): Met OT Short Term Goal 4 (Week 1): Patient will perform toilet transfer with moderate assistance OT Short Term Goal 4 -  Progress (Week 1): Met Week 2:  OT Short Term Goal 1 (Week 2): Short Term Goals = Long Term Goals  Skilled Therapeutic Interventions/Progress Updates:  Balance/vestibular training;Community reintegration;Discharge planning;DME/adaptive equipment instruction;Functional mobility training;Neuromuscular re-education;Pain management;Patient/family education;Psychosocial support;Self Care/advanced ADL retraining;Skin care/wound managment;Splinting/orthotics;Therapeutic Activities;Therapeutic Exercise;UE/LE Strength taining/ROM;UE/LE Coordination activities;Wheelchair propulsion/positioning    Precautions:  Precautions Precautions: Fall Restrictions Weight Bearing Restrictions: Yes RUE Weight Bearing: Non weight bearing RLE Weight Bearing: Non weight bearing Other Position/Activity Restrictions: Gentle PROM Rt. elbow and wrist per Montez Morita, PA order 07/22/12; in chart prior to CIR eval  See FIM for current functional status  ----------------------------------------------------------------------------------------------------  SESSION NOTES  Session #1 [id number] - 55 Minutes Individual Therapy No complaints of pain Patient found supine in bed eating breakfast with HOB raised. After breakfast patient engaged in bed mobility and performed supine -> sit with supervision without bed rails and without HOB raised. From there patient transferred edge of bed -> w/c with steady assist. Patient then performed UB/LB bathing at sink level in sit<>stand position. Focused skilled intervention on bed mobility, transfers, sit<>stands, dynamic standing balance/tolerance/endurance, UB/LB bathing & dressing, grooming tasks seated at sink, and overall activity tolerance/endurance. Patient propelled self out into hallway to meet up with PT after OT session.   Session #2 [id number] - 45 Minutes Individual Therapy No complaints of pain Patient found seated in w/c. Patient propelled self from room -> 1st floor of  hospital with multiple rest breaks. Therapist then propelled patient outside for w/c mobility on uneven ground surfaces. While outside also completed PROM RUE shoulder exercises/stretching and BLE AROM exercises. Patient then propelled self back inside and therapist propelled patient back to 4th floor  for patient to propel self back to room. Patient left seated in w/c in room to maneuver around prn.   [name] 07/30/2012, 8:06 AM

## 2012-07-30 NOTE — Progress Notes (Signed)
[facility] ROUNDING NOTE   Subjective:   Interval History: improved  Objective:  Vital signs in last 24 hours:  Temp:  [98 F (36.7 C)-98.4 F (36.9 C)] 98 F (36.7 C) (03/20 0544) Pulse Rate:  [83] 83 (03/20 0544) Resp:  [18] 18 (03/20 0544) BP: (134-147)/(66-70) 147/70 mmHg (03/20 0544) SpO2:  [97 %-100 %] 97 % (03/20 0544) Weight:  [108.5 kg (239 lb 3.2 oz)] 108.5 kg (239 lb 3.2 oz) (03/20 0544)  Weight change: -1.406 kg (-3 lb 1.6 oz) Filed Weights   07/24/12 0500 07/29/12 0503 07/30/12 0544  Weight: 114 kg (251 lb 5.2 oz) 109.907 kg (242 lb 4.8 oz) 108.5 kg (239 lb 3.2 oz)    Intake/Output: I/O last 3 completed shifts: In: 480 [P.O.:480] Out: 1675 [Urine:1675]   Intake/Output this shift:  Total I/O In: 240 [P.O.:240] Out: 300 [Urine:300]  AOZ:HYQMVHQIONG sitting up in bed  EXB:MWUXL RRR, normal S1 and S2  Resp: Decreased BS over bases- otherwise CTA  KGM:WNUU, obese, NT, BS normal  Ext: Trace LE edema    Basic Metabolic Panel:  Recent Labs Lab 07/25/12 0610 07/26/12 0754 07/26/12 0917 07/27/12 0607 07/29/12 0610 07/30/12 0610  NA 143 142 143 144 143 143  K 4.4 4.2 4.5 4.1 3.9 3.9  CL 109 109 110 112 111 110  CO2 23 22 22 22 22 22   GLUCOSE 150* 110* 149* 128* 135* 126*  BUN 40* 40* 40* 39* 39* 37*  CREATININE 3.80* 3.69* 3.71* 3.83* 3.55* 3.26*  CALCIUM 8.4 8.4 8.3* 8.2* 8.4 8.4  PHOS 4.8* 5.2*  --  5.2* 4.7* 4.3    Liver Function Tests:  Recent Labs Lab 07/24/12 0700 07/25/12 0610 07/26/12 0754 07/27/12 0607 07/29/12 0610 07/30/12 0610  AST 15  --   --   --   --   --   ALT 17  --   --   --   --   --   ALKPHOS 53  --   --   --   --   --   BILITOT 0.5  --   --   --   --   --   PROT 5.7*  --   --   --   --   --   ALBUMIN 2.3*  2.3* 2.4* 2.4* 2.4* 2.5* 2.7*   No results found for this basename: LIPASE, AMYLASE,  in the last 168 hours No results found for this basename: AMMONIA,  in the last 168 hours  CBC:  Recent  Labs Lab 07/24/12 0700 07/30/12 0610  WBC 16.1* 11.0*  HGB 8.3* 8.3*  HCT 24.3* 24.8*  MCV 89.0 91.2  PLT 301 258    Cardiac Enzymes: No results found for this basename: CKTOTAL, CKMB, CKMBINDEX, TROPONINI,  in the last 168 hours  BNP: No components found with this basename: POCBNP,   CBG:  Recent Labs Lab 07/29/12 0739 07/29/12 1128 07/29/12 1634 07/29/12 2138 07/30/12 0709  GLUCAP 130* 76 128* 110* 117*    Microbiology: Results for orders placed during the hospital encounter of 07/23/12  URINE CULTURE     Status: None   Collection Time    07/24/12  9:13 PM      Result Value Range Status   Specimen Description URINE, CLEAN CATCH   Final   Special Requests NONE   Final   Culture  Setup Time 07/25/2012 05:14   Final   Colony Count NO GROWTH   Final   Culture NO GROWTH  Final   Report Status 07/26/2012 FINAL   Final    Coagulation Studies:  Recent Labs  07/28/12 0536 07/29/12 0610 07/30/12 0610  LABPROT 24.8* 26.5* 26.5*  INR 2.37* 2.59* 2.59*    Urinalysis: No results found for this basename: COLORURINE, APPERANCEUR, LABSPEC, PHURINE, GLUCOSEU, HGBUR, BILIRUBINUR, KETONESUR, PROTEINUR, UROBILINOGEN, NITRITE, LEUKOCYTESUR,  in the last 72 hours    Imaging: No results found.   Medications:     . ciprofloxacin  250 mg Oral BID  . darbepoetin (ARANESP) injection - NON-DIALYSIS  100 mcg Subcutaneous Q Sat-1800  . insulin aspart  0-15 Units Subcutaneous TID WC  . insulin aspart  0-5 Units Subcutaneous QHS  . pantoprazole  40 mg Oral Daily  . polyethylene glycol  17 g Oral Daily  . tamsulosin  0.4 mg Oral Daily  . Warfarin - Pharmacist Dosing Inpatient   Does not apply q1800   acetaminophen, alum & mag hydroxide-simeth, bisacodyl, diphenhydrAMINE, guaiFENesin-dextromethorphan, methocarbamol, ondansetron (ZOFRAN) IV, ondansetron, traMADol, traZODone  Assessment/ Plan:  1. Acute renal failure: Multifactorial-primarily contrast-induced nephropathy  and ATN with hypotension and ongoing ARB/ACE inhibitor in this diabetic patient.renal function stable will follow  2. Multiple osseous injury status post fall: Ongoing evaluation and management per physical medicine and rehabilitation service. Status post open reduction internal fixation.  3. Anemia: Replace iron and start ESA therapy.  Appears to be mobilizing edema and creatinine slowly improving  Will sign off. Nothing to add anticipate recovery      LOS: 7 Roger Palmer @TODAY @11 :01 AM

## 2012-07-30 NOTE — Progress Notes (Signed)
Physical Therapy Session Note  Patient Details  Name: Roger Palmer MRN: [id number] Date of Birth: [birth year]  Today's Date: 07/30/2012 Time: 1430-1500 Time Calculation (min): 30 min  Short Term Goals: Week 1:  PT Short Term Goal 1 (Week 1): Pt will be able to complete basic transfers with min A PT Short Term Goal 1 - Progress (Week 1): Met PT Short Term Goal 2 (Week 1): Pt will be able to propel w/c x 100' on unit with S PT Short Term Goal 2 - Progress (Week 1): Met PT Short Term Goal 3 (Week 1): Pt will be able to complete sit to stand with mod A  PT Short Term Goal 3 - Progress (Week 1): Met  Skilled Therapeutic Interventions/Progress Updates:   Treatment focused on endurance and strengthening and transfers; steady A to/from Nustep and completed 5 min on Nustep with LUE and LLE only on level 5. Pt self propelled w/c to and from therapy session mod I .  Therapy Documentation Precautions:  Precautions Precautions: Fall Restrictions Weight Bearing Restrictions: Yes RUE Weight Bearing: Non weight bearing RLE Weight Bearing: Non weight bearing Other Position/Activity Restrictions: Gentle PROM Rt. elbow and wrist per Montez Morita, PA order 07/22/12; in chart prior to CIR eval  Pain  Denies pain.  See FIM for current functional status  Therapy/Group: Individual Therapy  Karolee Stamps [facility] 07/30/2012, 4:05 PM

## 2012-07-30 NOTE — Progress Notes (Signed)
Subjective:     Patient reports pain as minimal at elbow.  Feels well..    Objective: Vital signs in last 24 hours: Temp:  [98 F (36.7 C)-98.6 F (37 C)] 98.6 F (37 C) (03/20 1540) Pulse Rate:  [83] 83 (03/20 1540) Resp:  [18-20] 20 (03/20 1540) BP: (147-151)/(70-75) 151/75 mmHg (03/20 1540) SpO2:  [97 %] 97 % (03/20 1540) Weight:  [108.5 kg (239 lb 3.2 oz)] 108.5 kg (239 lb 3.2 oz) (03/20 0544)  Intake/Output from previous day: 03/19 0701 - 03/20 0700 In: 480 [P.O.:480] Out: 825 [Urine:825] Intake/Output this shift: Total I/O In: 600 [P.O.:600] Out: 950 [Urine:950]   Recent Labs  07/30/12 0610  HGB 8.3*    Recent Labs  07/30/12 0610  WBC 11.0*  RBC 2.72*  HCT 24.8*  PLT 258    Recent Labs  07/29/12 0610 07/30/12 0610  NA 143 143  K 3.9 3.9  CL 111 110  CO2 22 22  BUN 39* 37*  CREATININE 3.55* 3.26*  GLUCOSE 135* 126*  CALCIUM 8.4 8.4    Recent Labs  07/29/12 0610 07/30/12 0610  INR 2.59* 2.59*    wound looks good with no erythema or active drainage.  some evidence of serous drainage crusted on skin.  sutures are absorbale and have started to fall out.  no swelling.  non tender.  maceration on ulnar side of forearm with one blister without skin break or erythema.  splint with some skin slough in it.  Assessment/Plan:     Splint cleaned with nurse.  Sutures have started falling out and have been removed.  Patients arm cleaned.  Foul odor is from skin cells and sweat in splint.  Blister dressed and protected.  Will ask therapy to change pad in splint and provide stockinet.  No sign of infection.  Kayelynn Abdou R 07/30/2012, 5:57 PM

## 2012-07-30 NOTE — Progress Notes (Signed)
ANTICOAGULATION CONSULT NOTE - Follow Up Consult  Pharmacy Consult for coumadin Indication: VTE prophylaxis  No Known Allergies  Patient Measurements: Height: 5\' 9"  (175.3 cm) Weight: 239 lb 3.2 oz (108.5 kg) IBW/kg (Calculated) : 70.7  Vital Signs: Temp: 98 F (36.7 C) (03/20 0544) Temp src: Oral (03/20 0544) BP: 147/70 mmHg (03/20 0544) Pulse Rate: 83 (03/20 0544)  Labs:  Recent Labs  07/28/12 0536 07/29/12 0610 07/30/12 0610  HGB  --   --  8.3*  HCT  --   --  24.8*  PLT  --   --  258  LABPROT 24.8* 26.5* 26.5*  INR 2.37* 2.59* 2.59*  CREATININE  --  3.55* 3.26*    Estimated Creatinine Clearance: 28.5 ml/min (by C-G formula based on Cr of 3.26).   Medications:  Scheduled:  . ciprofloxacin  250 mg Oral BID  . darbepoetin (ARANESP) injection - NON-DIALYSIS  100 mcg Subcutaneous Q Sat-1800  . [EXPIRED] ferric gluconate (FERRLECIT/NULECIT) IV  250 mg Intravenous Daily  . insulin aspart  0-15 Units Subcutaneous TID WC  . insulin aspart  0-5 Units Subcutaneous QHS  . pantoprazole  40 mg Oral Daily  . polyethylene glycol  17 g Oral Daily  . tamsulosin  0.4 mg Oral Daily  . [COMPLETED] warfarin  5 mg Oral ONCE-1800  . Warfarin - Pharmacist Dosing Inpatient   Does not apply q1800   Infusions:     Assessment: 62 yo male s/p ortho surgery is currently on therapeutic coumadin.  INR is 2.59 today, and has been stable on 5mg  daily for the past 7 days. H/H stable at 8.3/24.8, plt 258. Noted cipro 250mg  PO BID was added for wound infection, which decrease coumadin metabolism.  Goal of Therapy:  INR 2-3    Plan:  1) Coumadin 5mg  po x 1 2) f/u INR in AM  Bayard Hugger, PharmD, BCPS  Clinical Pharmacist  Pager: [phone]   07/30/2012,1:07 PM

## 2012-07-31 ENCOUNTER — Inpatient Hospital Stay (HOSPITAL_COMMUNITY): Payer: 59

## 2012-07-31 ENCOUNTER — Inpatient Hospital Stay (HOSPITAL_COMMUNITY): Payer: 59 | Admitting: *Deleted

## 2012-07-31 ENCOUNTER — Inpatient Hospital Stay (HOSPITAL_COMMUNITY): Payer: 59 | Admitting: Physical Therapy

## 2012-07-31 DIAGNOSIS — W11XXXA Fall on and from ladder, initial encounter: Secondary | ICD-10-CM

## 2012-07-31 DIAGNOSIS — S32409A Unspecified fracture of unspecified acetabulum, initial encounter for closed fracture: Secondary | ICD-10-CM

## 2012-07-31 LAB — RENAL FUNCTION PANEL
Albumin: 2.7 g/dL — ABNORMAL LOW (ref 3.5–5.2)
Calcium: 8.8 mg/dL (ref 8.4–10.5)
Creatinine, Ser: 3.05 mg/dL — ABNORMAL HIGH (ref 0.50–1.35)
GFR calc non Af Amer: 20 mL/min — ABNORMAL LOW (ref >90)

## 2012-07-31 LAB — GLUCOSE, CAPILLARY
Glucose-Capillary: 131 mg/dL — ABNORMAL HIGH (ref 70–99)
Glucose-Capillary: 98 mg/dL (ref 70–99)

## 2012-07-31 LAB — PROTIME-INR
INR: 2.71 — ABNORMAL HIGH (ref 0.00–1.49)
Prothrombin Time: 27.4 seconds — ABNORMAL HIGH (ref 11.6–15.2)

## 2012-07-31 MED ORDER — LORATADINE 10 MG PO TABS
10.0000 mg | ORAL_TABLET | Freq: Every day | ORAL | Status: DC
  Administered 2012-07-31 – 2012-08-06 (×7): 10 mg via ORAL
  Filled 2012-07-31 (×10): qty 1

## 2012-07-31 MED ORDER — WARFARIN SODIUM 5 MG PO TABS
5.0000 mg | ORAL_TABLET | Freq: Once | ORAL | Status: AC
  Administered 2012-07-31: 5 mg via ORAL
  Filled 2012-07-31: qty 1

## 2012-07-31 NOTE — Progress Notes (Signed)
Subjective/Complaints: Pain under control. Having upper respiratory drainage and full sinuses A 12 point review of systems has been performed and if not noted above is otherwise negative.   Objective: Vital Signs: Blood pressure 148/85, pulse 79, temperature 98.3 F (36.8 C), temperature source Oral, resp. rate 19, height 5\' 9"  (1.753 m), weight 104.4 kg (230 lb 2.6 oz), SpO2 95.00%. No results found.  Recent Labs  07/30/12 0610  WBC 11.0*  HGB 8.3*  HCT 24.8*  PLT 258    Recent Labs  07/30/12 0610 07/31/12 0615  NA 143 145  K 3.9 3.8  CL 110 111  GLUCOSE 126* 136*  BUN 37* 34*  CREATININE 3.26* 3.05*  CALCIUM 8.4 8.8   CBG (last 3)   Recent Labs  07/30/12 1630 07/30/12 2057 07/31/12 0707  GLUCAP 93 133* 131*    Wt Readings from Last 3 Encounters:  07/31/12 104.4 kg (230 lb 2.6 oz)  07/12/12 102.059 kg (225 lb)  07/12/12 102.059 kg (225 lb)    Physical Exam:  Constitutional: He is oriented to person, place, and time. He appears well-developed and well-nourished.  Flat affect, no distress  HENT:  Head: Normocephalic and atraumatic.  Eyes: Pupils are equal, round, and reactive to light.  Neck: Normal range of motion.  Cardiovascular: Normal rate and regular rhythm.  Pulmonary/Chest: Effort normal and breath sounds normal.  Abdominal: He exhibits distension. There is no tenderness.  + BS.   Genitourinary:  1 to 2+ scrotal edema,decreased Musculoskeletal: He exhibits edema.  RUE-1+ edema at hand and dependently at elbow.right arm dressed. Elbow wound with foul smell and greenish-yellow dc. Wound a little macerated and with fibrinous tissue centrally. Abraded lacerated area right lateral thigh with moderate amount of serous drainage. 2+ pitting edema RLE. 1+ edema LLE.  Neurological: He is alert and oriented to person, place, and time.  Speech measured at times but better. Follows basic commands without difficulty.    Skin: Skin is warm. See  above Psychiatric: Thought content normal. His affect is blunt. He is slowed at times. Cognition and memory are fair. Reasonable insight and awareness In motor strength is 5/5 in the left deltoid, biceps, grip left lower extremity 4 minus at the hip flexor knee extensor ankle dorsiflexor plantar flexor  Right lower extremity 3 minus hip flexor knee extensor 4 minus ankle dorsiflexor plantar flexor  Right upper extremity is in splint,     Assessment/Plan: 1. Functional deficits secondary to complex right hemipelvis fx, right elbow dislocation with distal radius fx, mild TBI which require 3+ hours per day of interdisciplinary therapy in a comprehensive inpatient rehab setting. Physiatrist is providing close team supervision and 24 hour management of active medical problems listed below. Physiatrist and rehab team continue to assess barriers to discharge/monitor patient progress toward functional and medical goals. FIM: FIM - Bathing Bathing Steps Patient Completed: Chest;Right Arm;Left Arm;Abdomen;Front perineal area;Right upper leg;Left upper leg Bathing: 3: Mod-Patient completes 5-7 32f 10 parts or 50-74%  FIM - Upper Body Dressing/Undressing Upper body dressing/undressing steps patient completed: Thread/unthread right sleeve of pullover shirt/dresss;Thread/unthread left sleeve of pullover shirt/dress;Put head through opening of pull over shirt/dress;Pull shirt over trunk Upper body dressing/undressing: 5: Supervision: Safety issues/verbal cues FIM - Lower Body Dressing/Undressing Lower body dressing/undressing steps patient completed: Thread/unthread right pants leg;Thread/unthread left pants leg Lower body dressing/undressing: 2: Max-Patient completed 25-49% of tasks  FIM - Toileting Toileting steps completed by patient: Performs perineal hygiene Toileting Assistive Devices: Grab bar or rail for support Toileting:  0: Activity did not occur  FIM - Scientist, research (physical sciences) Devices: Elevated toilet seat;Grab bars Toilet Transfers: 0-Activity did not occur  FIM - Banker Devices: Arm rests (leg lifter) Bed/Chair Transfer: 5: Supine > Sit: Supervision (verbal cues/safety issues);5: Sit > Supine: Supervision (verbal cues/safety issues);4: Bed > Chair or W/C: Min A (steadying Pt. > 75%);4: Chair or W/C > Bed: Min A (steadying Pt. > 75%)  FIM - Locomotion: Wheelchair Distance: 200 Locomotion: Wheelchair: 6: Travels 150 ft or more, turns around, maneuvers to table, bed or toilet, negotiates 3% grade: maneuvers on rugs and over door sills independently FIM - Locomotion: Ambulation Ambulation/Gait Assistance: Not tested (comment) (unsafe at eval due to NWB RLE and RUE) Locomotion: Ambulation: 0: Activity did not occur  Comprehension Comprehension Mode: Auditory Comprehension: 6-Follows complex conversation/direction: With extra time/assistive device  Expression Expression Mode: Verbal Expression: 7-Expresses complex ideas: With no assist  Social Interaction Social Interaction Mode: Asleep Social Interaction: 6-Interacts appropriately with others with medication or extra time (anti-anxiety, antidepressant).  Problem Solving Problem Solving Mode: Asleep Problem Solving: 5-Solves complex 90% of the time/cues < 10% of the time  Memory Memory Mode: Asleep Memory: 6-More than reasonable amt of time  Medical Problem List and Plan:  1. DVT Prophylaxis/Anticoagulation: Pharmaceutical: Coumadin. Lovenox discontinued as INR therapeutic.  2. Pain Management: Will continue prn ultram.  3. Mood:  flat  4. Neuropsych: This patient is capable of making decisions on his/her own behalf.  5. Right anterior column/anterior wall acetabular Fx: No formal hip precautions. NWB if unable to TDWB RLE.   -dc'ed sutures from hypogastric area 6. Right distal humerus and Right distal radius fractue: NWB.  -adjustments in splint have  held arm in place  -xrays last week showed good position.  -cipro/dressing changes as below. 7. Leucocytosis: decreased counts, but---  -no new issues  -will dc abx  -ortho follow up 8. Post op ileus: Continue reglan. Added miralax to help with bowel program. Suppository prn no BM in 24 hours.  9. Acute renal failure/Metabolic acidosis: continue per renal recs 10. DM Type 2: Will continue to monitor with AC/HS cbg checks. Hgb A1C @6 .5. Elevated BS likely due to stress. SSI for elevated BS and titrate medications as indicated. Off metformin due to renal failure. added Amaryl / off lantus.  -fair control at present  11. GU: Incontinent of urine due to IVF and well as scrotal edema/penis retraction.  Marland Kitchen   UA/UCS negative  -elevate scrotum--swelling improved  LOS (Days) 8 A FACE TO FACE EVALUATION WAS PERFORMED  Roger Palmer T 07/31/2012 9:43 AM

## 2012-07-31 NOTE — Progress Notes (Signed)
Occupational Therapy Session Note  Patient Details  Name: Roger Palmer MRN: [id number] Date of Birth: 01-27-1951  Today's Date: 07/31/2012 Time:  -  0800-0900  (60 min)     Short Term Goals: Week 1:  OT Short Term Goal 1 (Week 1): Patient will perform UB dressing with moderate assistance OT Short Term Goal 1 - Progress (Week 1): Met OT Short Term Goal 2 (Week 1): Patient will perform LB dressing with maximal assistance OT Short Term Goal 2 - Progress (Week 1): Met OT Short Term Goal 3 (Week 1): Patient will complete grooming tassk of brushing teeth, washing face, and washing hands at sink with minimal assistance OT Short Term Goal 3 - Progress (Week 1): Met OT Short Term Goal 4 (Week 1): Patient will perform toilet transfer with moderate assistance OT Short Term Goal 4 - Progress (Week 1): Met  Skilled Therapeutic Interventions/Progress Updates:    No complaints of pain    1st session:   Pain:  None.   Patient found supine in bed eating breakfast with HOB raised. After breakfast patient engaged in bed mobility and performed supine -> sit with supervision without bed rails and witht HOB raised. patient transferred edge of bed -> w/c with min assist.   Pt. Transferred to toilet with minimal assist and had BM /urine.  Pt. Transferred to 3n1 to take shower.  Covered arm and knee bandage.  Pt. Performed  UB/LB bathing with assist with left arm,  BLE feet and post. Periarea.  Did  sit<>stand position.for periara.   Focused skilled intervention on bed mobility, transfers, sit<>stands, dynamic standing balance/tolerance/endurance, UB/LB bathing & dressing, grooming tasks  at [place] and overall activity tolerance/endurance.    2nd session:   Individual session:  Time:  1430- 1530  (60 min)  Pain:  None.  Pt. Lying in bed upon OT arrival.   .   Addressed functional mobility transfers, sit to stand, dynamic standing balance and tolerance.  Ambulated to bathroom with RW at min assist to supervision  level.  Stood to urinate into urinal.  Propelled wc to rehab gym with minimal assist to keep from running into right wall.  Addressed. R elbow padding with new gauze and applied stockinette for comfort and hygiene. Practiced gentle Right finger AROM.  Educated pt on performing retrograde massage to hand and practiced.  Did standing balance with minimal assist for 3 minutes during functional card game.  Left pt in room with call bell and phone within reach.    Therapy Documentation Precautions:  Precautions Precautions: Fall Restrictions Weight Bearing Restrictions: Yes RUE Weight Bearing: Non weight bearing RLE Weight Bearing: Non weight bearing Other Position/Activity Restrictions: Gentle PROM Rt. elbow and wrist per Montez Morita, PA order 07/22/12; in chart prior to CIR eval        See FIM for current functional status  Therapy/Group: Individual Therapy  [name] 07/31/2012, 9:10 AM

## 2012-07-31 NOTE — Progress Notes (Signed)
Occupational Therapy Note  Patient Details  Name: Roger Palmer MRN: [id number] Date of Birth: [birth year] Today's Date: 07/31/2012  Time: 4696-2952 Pt denies pain Individual Therapy  Pt engaged in practicing squat pivot transfers w/c<>therapy mat and bed mobility using leg lifter.  Pt performed supine<> X 4 using leg lifter with supervision. Initiated instructing patient on removing and placing right leg rest in place. Pt completed squat pivot transfers with supervision.   Lavone Neri [facility] 07/31/2012, 11:15 AM

## 2012-07-31 NOTE — Progress Notes (Signed)
Physical Therapy Session Note  Patient Details  Name: [name] MRN: [id number] Date of Birth: July 02, 1950  Today's Date: 07/31/2012 Time: 1478-2956 Time Calculation (min): 44 min  Short Term Goals: Week 2  Skilled Therapeutic Interventions/Progress Updates:    Pt engaged in [place] transfer training wheelchair to/from low couch and to/from actual bed with min-guard assist, min verbal cues for set-up and safety. Pt needs assist with Rt. Brakes and legrests secondary to musculoskeletal injuries. Bed mobility practiced x 2 reps with supervision, cues for ease of movement and efficiency. Pt utilizes leg lifter for Rt. LE getting into and out of bed.   Standing balance activities performed with table in front as needed, practiced reaching with red weighted ball in various directions while maintaining weightbeaing precautions. Increased difficulty by standing on compliant surface. Pt overall needed min assist for balance, min verbal cues for maintaining precautions. Sit <> stands from wheelchair at min assist level today.   Therapy Documentation Precautions:  Precautions Precautions: Fall Restrictions Weight Bearing Restrictions: Yes RUE Weight Bearing: Non weight bearing RLE Weight Bearing: Non weight bearing Other Position/Activity Restrictions: Gentle PROM Rt. elbow and wrist per Montez Morita, PA order 07/22/12; in chart prior to CIR eval Pain: Pain Assessment Pain Score:   1 Pain Type: Surgical pain Pain Location: Hip Pain Orientation: Right Pain Descriptors: Aching Pain Onset: On-going Pain Intervention(s): Repositioned (did not request medication)  See FIM for current functional status  Therapy/Group: Individual Therapy  Wilhemina Bonito 07/31/2012, 11:47 AM

## 2012-07-31 NOTE — Progress Notes (Signed)
ANTICOAGULATION CONSULT NOTE - Follow Up Consult  Pharmacy Consult for coumadin Indication: VTE prophylaxis  No Known Allergies  Patient Measurements: Height: 5\' 9"  (175.3 cm) Weight: 230 lb 2.6 oz (104.4 kg) IBW/kg (Calculated) : 70.7  Vital Signs: Temp: 98.3 F (36.8 C) (03/21 0543) Temp src: Oral (03/21 0543) BP: 148/85 mmHg (03/21 0543) Pulse Rate: 79 (03/21 0543)  Labs:  Recent Labs  07/29/12 0610 07/30/12 0610 07/31/12 0615  HGB  --  8.3*  --   HCT  --  24.8*  --   PLT  --  258  --   LABPROT 26.5* 26.5* 27.4*  INR 2.59* 2.59* 2.71*  CREATININE 3.55* 3.26* 3.05*    Estimated Creatinine Clearance: 29.9 ml/min (by C-G formula based on Cr of 3.05).   Medications:  Scheduled:  . darbepoetin (ARANESP) injection - NON-DIALYSIS  100 mcg Subcutaneous Q Sat-1800  . insulin aspart  0-15 Units Subcutaneous TID WC  . insulin aspart  0-5 Units Subcutaneous QHS  . loratadine  10 mg Oral Daily  . pantoprazole  40 mg Oral Daily  . polyethylene glycol  17 g Oral Daily  . tamsulosin  0.4 mg Oral Daily  . [COMPLETED] warfarin  5 mg Oral ONCE-1800  . Warfarin - Pharmacist Dosing Inpatient   Does not apply q1800  . [DISCONTINUED] ciprofloxacin  250 mg Oral BID   Infusions:     Assessment: 62 yo male s/p ortho surgery is currently on therapeutic coumadin.  INR is 2.71 today, and has been stable on 5mg  daily.  H/H stable at 8.3/24.8, plt 258. Noted cipro 250mg  PO BID was added for wound infection, which decrease coumadin metabolism.  Goal of Therapy:  INR 2-3  Plan:  1) Coumadin 5mg  po x 1 2) f/u INR in AM  Thank you. Okey Regal, PharmD [phone]   07/31/2012,11:17 AM

## 2012-08-01 ENCOUNTER — Inpatient Hospital Stay (HOSPITAL_COMMUNITY): Payer: 59 | Admitting: *Deleted

## 2012-08-01 LAB — GLUCOSE, CAPILLARY
Glucose-Capillary: 103 mg/dL — ABNORMAL HIGH (ref 70–99)
Glucose-Capillary: 105 mg/dL — ABNORMAL HIGH (ref 70–99)
Glucose-Capillary: 126 mg/dL — ABNORMAL HIGH (ref 70–99)

## 2012-08-01 LAB — RENAL FUNCTION PANEL
Calcium: 8.6 mg/dL (ref 8.4–10.5)
GFR calc Af Amer: 27 mL/min — ABNORMAL LOW (ref >90)
Glucose, Bld: 130 mg/dL — ABNORMAL HIGH (ref 70–99)
Phosphorus: 3.8 mg/dL (ref 2.3–4.6)
Sodium: 144 mEq/L (ref 135–145)

## 2012-08-01 LAB — PROTIME-INR: INR: 2.68 — ABNORMAL HIGH (ref 0.00–1.49)

## 2012-08-01 MED ORDER — WARFARIN SODIUM 5 MG PO TABS
5.0000 mg | ORAL_TABLET | Freq: Every day | ORAL | Status: DC
  Administered 2012-08-01 – 2012-08-02 (×2): 5 mg via ORAL
  Filled 2012-08-01 (×3): qty 1

## 2012-08-01 NOTE — Progress Notes (Signed)
ANTICOAGULATION CONSULT NOTE - Follow Up Consult  Pharmacy Consult for coumadin Indication: VTE prophylaxis  No Known Allergies  Patient Measurements: Height: 5\' 9"  (175.3 cm) Weight: 229 lb 15 oz (104.3 kg) IBW/kg (Calculated) : 70.7  Vital Signs: Temp: 98.6 F (37 C) (03/22 0455) Temp src: Oral (03/22 0455) BP: 151/79 mmHg (03/22 0455) Pulse Rate: 83 (03/22 0455)  Labs:  Recent Labs  07/30/12 0610 07/31/12 0615 08/01/12 0550  HGB 8.3*  --   --   HCT 24.8*  --   --   PLT 258  --   --   LABPROT 26.5* 27.4* 27.2*  INR 2.59* 2.71* 2.68*  CREATININE 3.26* 3.05* 2.76*    Estimated Creatinine Clearance: 33 ml/min (by C-G formula based on Cr of 2.76).   Medications:  Scheduled:  . darbepoetin (ARANESP) injection - NON-DIALYSIS  100 mcg Subcutaneous Q Sat-1800  . insulin aspart  0-15 Units Subcutaneous TID WC  . insulin aspart  0-5 Units Subcutaneous QHS  . loratadine  10 mg Oral Daily  . pantoprazole  40 mg Oral Daily  . polyethylene glycol  17 g Oral Daily  . tamsulosin  0.4 mg Oral Daily  . [COMPLETED] warfarin  5 mg Oral ONCE-1800  . Warfarin - Pharmacist Dosing Inpatient   Does not apply q1800  . [DISCONTINUED] ciprofloxacin  250 mg Oral BID    Assessment: 62 yo male s/p ortho surgery is currently on therapeutic coumadin.  INR is 2.68 today, and has been stable on 5mg  daily. Note he received 3 doses of Cipro that was subsequently discontinued 3/21 per MD.  With this short duration of antibiotics I do not anticipate this will influence his Coumadin requirements.  Goal of Therapy:  INR 2-3  Plan:  1) Coumadin 5mg  po daily 2) Change PT/INR checks to M-W-F  Estella Husk, Pharm.D., BCPS Clinical Pharmacist Phone: [phone] or [phone] Pager: [phone] 08/01/2012, 8:10 AM

## 2012-08-01 NOTE — Progress Notes (Signed)
Subjective/Complaints: Pain under control. No new problems last night A 12 point review of systems has been performed and if not noted above is otherwise negative.   Objective: Vital Signs: Blood pressure 151/79, pulse 83, temperature 98.6 F (37 C), temperature source Oral, resp. rate 20, height 5\' 9"  (1.753 m), weight 104.3 kg (229 lb 15 oz), SpO2 94.00%. Dg Elbow 2 Views Right  07/31/2012  *RADIOLOGY REPORT*  Clinical Data: Trauma, postop  RIGHT ELBOW - 2 VIEW  Comparison: Right elbow films of 07/24/2012  Findings: On the views obtained a prosthetic radial head appears to be in good position, and alignment is grossly normal.  There is soft tissue swelling over the olecranon.  No definite joint effusion is noted.  IMPRESSION: Grossly normal alignment.  Soft tissue swelling over the olecranon.   Original Report Authenticated By: Dwyane Dee, M.D.    Dg Pelvis Comp Min 3v  07/31/2012  *RADIOLOGY REPORT*  Clinical Data: 62 year old male status post pelvis ORIF after trauma with pelvic fractures.  JUDET PELVIS - 3+ VIEW  Comparison: CT pelvis 07/20/2012 and earlier.  Findings: Cortical screw across the right iliac wing traversing a linear nondisplaced fracture appears stable and intact.  Malleable plate and screw fixation of the medial right iliac bone and acetabulum extending to the medial right pubis.  Hardware appears stable and intact.  No significant displacement or angulation of fracture fragment identified.  Right femoral head remains normally located.  No new fracture identified.  IMPRESSION: Stable postoperative appearance of the pelvis since 07/20/2012 and CT with no adverse features identified.   Original Report Authenticated By: Erskine Speed, M.D.     Recent Labs  07/30/12 0610  WBC 11.0*  HGB 8.3*  HCT 24.8*  PLT 258    Recent Labs  07/31/12 0615 08/01/12 0550  NA 145 144  K 3.8 3.9  CL 111 110  GLUCOSE 136* 130*  BUN 34* 31*  CREATININE 3.05* 2.76*  CALCIUM 8.8 8.6    CBG (last 3)   Recent Labs  07/31/12 1632 07/31/12 2120 08/01/12 0739  GLUCAP 98 115* 124*    Wt Readings from Last 3 Encounters:  08/01/12 104.3 kg (229 lb 15 oz)  07/12/12 102.059 kg (225 lb)  07/12/12 102.059 kg (225 lb)    Physical Exam:  Constitutional: He is oriented to person, place, and time. He appears well-developed and well-nourished.  Flat affect, no distress  HENT:  Head: Normocephalic and atraumatic.  Eyes: Pupils are equal, round, and reactive to light.  Neck: Normal range of motion.  Cardiovascular: Normal rate and regular rhythm.  Pulmonary/Chest: Effort normal and breath sounds normal.  Abdominal: He exhibits distension. There is no tenderness.  + BS.   Genitourinary:  1+ scrotal edema,decreased Musculoskeletal: He exhibits edema.  RUE-1+ edema at hand and dependently at elbow.right arm dressed. Wounds dry. 1+ pitting edema RLE. 1+ edema LLE.  Neurological: He is alert and oriented to person, place, and time.  Speech measured at times but better. Follows basic commands without difficulty.    Skin: Skin is warm. See above Psychiatric: Thought content normal. His affect is blunt. He is slowed at times. Cognition and memory are fair. Reasonable insight and awareness In motor strength is 5/5 in the left deltoid, biceps, grip left lower extremity 4 minus at the hip flexor knee extensor ankle dorsiflexor plantar flexor  Right lower extremity 3 minus hip flexor knee extensor 4 minus ankle dorsiflexor plantar flexor  Right upper extremity is in splint,  Assessment/Plan: 1. Functional deficits secondary to complex right hemipelvis fx, right elbow dislocation with distal radius fx, mild TBI which require 3+ hours per day of interdisciplinary therapy in a comprehensive inpatient rehab setting. Physiatrist is providing close team supervision and 24 hour management of active medical problems listed below. Physiatrist and rehab team continue to assess barriers to  discharge/monitor patient progress toward functional and medical goals. FIM: FIM - Bathing Bathing Steps Patient Completed: Chest;Right Arm;Abdomen;Front perineal area;Right upper leg;Left upper leg Bathing: 3: Mod-Patient completes 5-7 35f 10 parts or 50-74%  FIM - Upper Body Dressing/Undressing Upper body dressing/undressing steps patient completed: Thread/unthread right sleeve of pullover shirt/dresss;Thread/unthread left sleeve of pullover shirt/dress;Put head through opening of pull over shirt/dress;Pull shirt over trunk Upper body dressing/undressing: 5: Supervision: Safety issues/verbal cues FIM - Lower Body Dressing/Undressing Lower body dressing/undressing steps patient completed: Thread/unthread right pants leg;Thread/unthread left pants leg Lower body dressing/undressing: 2: Max-Patient completed 25-49% of tasks  FIM - Toileting Toileting steps completed by patient: Performs perineal hygiene Toileting Assistive Devices: Grab bar or rail for support Toileting: 0: Activity did not occur  FIM - Diplomatic Services operational officer Devices: Elevated toilet seat;Grab bars Toilet Transfers: 4-To toilet/BSC: Min A (steadying Pt. > 75%);4-From toilet/BSC: Min A (steadying Pt. > 75%)  FIM - Bed/Chair Transfer Bed/Chair Transfer Assistive Devices: Bed rails;Arm rests;HOB elevated Bed/Chair Transfer: 5: Supine > Sit: Supervision (verbal cues/safety issues);5: Sit > Supine: Supervision (verbal cues/safety issues);4: Bed > Chair or W/C: Min A (steadying Pt. > 75%);4: Chair or W/C > Bed: Min A (steadying Pt. > 75%)  FIM - Locomotion: Wheelchair Distance: 200 Locomotion: Wheelchair: 6: Travels 150 ft or more, turns around, maneuvers to table, bed or toilet, negotiates 3% grade: maneuvers on rugs and over door sills independently FIM - Locomotion: Ambulation Ambulation/Gait Assistance: Not tested (comment) (unsafe at eval due to NWB RLE and RUE) Locomotion: Ambulation: 0: Activity did  not occur  Comprehension Comprehension Mode: Auditory Comprehension: 6-Follows complex conversation/direction: With extra time/assistive device  Expression Expression Mode: Verbal Expression: 6-Expresses complex ideas: With extra time/assistive device  Social Interaction Social Interaction Mode: Asleep Social Interaction: 6-Interacts appropriately with others with medication or extra time (anti-anxiety, antidepressant).  Problem Solving Problem Solving Mode: Asleep Problem Solving: 5-Solves complex 90% of the time/cues < 10% of the time  Memory Memory Mode: Asleep Memory: 6-More than reasonable amt of time  Medical Problem List and Plan:  1. DVT Prophylaxis/Anticoagulation: Pharmaceutical: Coumadin. Lovenox discontinued as INR therapeutic.  2. Pain Management: Will continue prn ultram.  3. Mood:  flat  4. Neuropsych: This patient is capable of making decisions on his/her own behalf.  5. Right anterior column/anterior wall acetabular Fx: No formal hip precautions. NWB if unable to TDWB RLE.   -dc'ed sutures from hypogastric area 6. Right distal humerus and Right distal radius fractue: NWB.  -adjustments in splint have held arm in place  -xrays last week showed good position.  -cipro/dressing changes as below. 7. Leucocytosis: --  -no new issues  -will dc abx  -ortho follow up appreciated 8. Post op ileus: Continue reglan. Added miralax to help with bowel program. Suppository prn no BM in 24 hours.  9. Acute renal failure/Metabolic acidosis: continue per renal recs 10. DM Type 2: Will continue to monitor with AC/HS cbg checks. Hgb A1C @6 .5. Elevated BS likely due to stress. SSI for elevated BS and titrate medications as indicated. Off metformin due to renal failure. added Amaryl / off lantus.  -fair control at  present  11. GU: Incontinent of urine due to IVF and well as scrotal edema/penis retraction.  Marland Kitchen   UA/UCS negative  -elevate scrotum--swelling improving daily  LOS  (Days) 9 A FACE TO FACE EVALUATION WAS PERFORMED  SWARTZ,ZACHARY T 08/01/2012 8:52 AM

## 2012-08-01 NOTE — Progress Notes (Signed)
Physical Therapy Session Note  Patient Details  Name: Roger Palmer MRN: [id number] Date of Birth: 1950-06-15  Today's Date: 08/01/2012 Time: 1430-1530 Time Calculation (min): 60 min  Short Term Goals: Week 2:  PT Short Term Goal 1 (Week 2): = LTGS  Skilled Therapeutic Interventions/Progress Updates:    Patient participated in LE strengthening exercise group: B hip flexion, B hip adduction with 5" squeeze, B hip abduction, B knee extension, B knee flexion, ankle pumps, 2x20 for warm up. Exercises repeated with 3# weight on L LE (no weight on R LE) for strengthening. Patient propelled wheelchair 71' with supervision with L LE only to promote increased strengthening. Patient transferred to mat with min assist and sit>supine with supervision. Patient performed single leg (L LE) bridges x20 and R LE hip abduction (with slideboard and pillow case to decrease friction) x20. Patient requires min assist supine>sit and mat>wheelchair. Patient returned to room and left seated in wheelchair with all needs within reach.  Therapy Documentation Precautions:  Precautions Precautions: Fall Restrictions Weight Bearing Restrictions: Yes RUE Weight Bearing: Non weight bearing RLE Weight Bearing: Non weight bearing Other Position/Activity Restrictions: Gentle PROM Rt. elbow and wrist per Montez Morita, PA order 07/22/12; in chart prior to CIR eval Pain: Pain Assessment Pain Assessment: No/denies pain Pain Score: 0-No pain  See FIM for current functional status  Therapy/Group: Group Therapy  Chipper Herb. Keisuke Hollabaugh, PT, DPT  08/01/2012, 3:47 PM

## 2012-08-02 ENCOUNTER — Inpatient Hospital Stay (HOSPITAL_COMMUNITY): Payer: 59 | Admitting: *Deleted

## 2012-08-02 LAB — RENAL FUNCTION PANEL
BUN: 28 mg/dL — ABNORMAL HIGH (ref 6–23)
CO2: 23 mEq/L (ref 19–32)
Chloride: 110 mEq/L (ref 96–112)
GFR calc Af Amer: 30 mL/min — ABNORMAL LOW (ref >90)
Glucose, Bld: 125 mg/dL — ABNORMAL HIGH (ref 70–99)
Potassium: 3.9 mEq/L (ref 3.5–5.1)
Sodium: 146 mEq/L — ABNORMAL HIGH (ref 135–145)

## 2012-08-02 LAB — GLUCOSE, CAPILLARY: Glucose-Capillary: 112 mg/dL — ABNORMAL HIGH (ref 70–99)

## 2012-08-02 NOTE — Progress Notes (Signed)
Occupational Therapy Note  Patient Details  Name: Roger Palmer MRN: [id number] Date of Birth: May 02, 1951 Today's Date: 08/02/2012  Time:  1115-1215  (60 min) Individual  Session Pain:  None  Engaged in transfers, standing balance, TDWB on RLE and RUE precautions.  Pt elected to bathe and dress at shower level.  Performed standing for pericare and donning pants with minimal assist.  Pt. Donned left shoe, but not right footie.  OT donned thigh highs.  Left pt with lunch tray  And call bell in reach.     [name] 08/02/2012, 12:18 PM

## 2012-08-02 NOTE — Progress Notes (Signed)
Subjective/Complaints: Another good night. Feeling stronger.  A 12 point review of systems has been performed and if not noted above is otherwise negative.   Objective: Vital Signs: Blood pressure 123/75, pulse 73, temperature 98.4 F (36.9 C), temperature source Oral, resp. rate 20, height 5\' 9"  (1.753 m), weight 99.5 kg (219 lb 5.7 oz), SpO2 96.00%. Dg Elbow 2 Views Right  07/31/2012  *RADIOLOGY REPORT*  Clinical Data: Trauma, postop  RIGHT ELBOW - 2 VIEW  Comparison: Right elbow films of 07/24/2012  Findings: On the views obtained a prosthetic radial head appears to be in good position, and alignment is grossly normal.  There is soft tissue swelling over the olecranon.  No definite joint effusion is noted.  IMPRESSION: Grossly normal alignment.  Soft tissue swelling over the olecranon.   Original Report Authenticated By: Dwyane Dee, M.D.    Dg Pelvis Comp Min 3v  07/31/2012  *RADIOLOGY REPORT*  Clinical Data: 62 year old male status post pelvis ORIF after trauma with pelvic fractures.  JUDET PELVIS - 3+ VIEW  Comparison: CT pelvis 07/20/2012 and earlier.  Findings: Cortical screw across the right iliac wing traversing a linear nondisplaced fracture appears stable and intact.  Malleable plate and screw fixation of the medial right iliac bone and acetabulum extending to the medial right pubis.  Hardware appears stable and intact.  No significant displacement or angulation of fracture fragment identified.  Right femoral head remains normally located.  No new fracture identified.  IMPRESSION: Stable postoperative appearance of the pelvis since 07/20/2012 and CT with no adverse features identified.   Original Report Authenticated By: Erskine Speed, M.D.    No results found for this basename: WBC, HGB, HCT, PLT,  in the last 72 hours  Recent Labs  08/01/12 0550 08/02/12 0615  NA 144 146*  K 3.9 3.9  CL 110 110  GLUCOSE 130* 125*  BUN 31* 28*  CREATININE 2.76* 2.52*  CALCIUM 8.6 8.7   CBG  (last 3)   Recent Labs  08/01/12 1659 08/01/12 2054 08/02/12 0718  GLUCAP 105* 126* 112*    Wt Readings from Last 3 Encounters:  08/02/12 99.5 kg (219 lb 5.7 oz)  07/12/12 102.059 kg (225 lb)  07/12/12 102.059 kg (225 lb)    Physical Exam:  Constitutional: He is oriented to person, place, and time. He appears well-developed and well-nourished.  Flat affect, no distress  HENT:  Head: Normocephalic and atraumatic.  Eyes: Pupils are equal, round, and reactive to light.  Neck: Normal range of motion.  Cardiovascular: Normal rate and regular rhythm.  Pulmonary/Chest: Effort normal and breath sounds normal.  Abdominal: He exhibits distension. There is no tenderness.  + BS.   Genitourinary:  1+ scrotal edema,decreased Musculoskeletal: He exhibits edema.  RUE-1+ edema at hand and dependently at elbow.right arm dressed. Wounds dry. 1+ pitting edema RLE. 1+ edema LLE.  Neurological: He is alert and oriented to person, place, and time.  Speech measured at times but better. Follows basic commands without difficulty.    Skin: Skin is warm. See above Psychiatric: Thought content normal. His affect is blunt. He is slowed at times. Cognition and memory are fair. Reasonable insight and awareness In motor strength is 5/5 in the left deltoid, biceps, grip left lower extremity 4 minus at the hip flexor knee extensor ankle dorsiflexor plantar flexor  Right lower extremity 3 minus hip flexor knee extensor 4 minus ankle dorsiflexor plantar flexor  Right upper extremity is in splint,     Assessment/Plan: 1.  Functional deficits secondary to complex right hemipelvis fx, right elbow dislocation with distal radius fx, mild TBI which require 3+ hours per day of interdisciplinary therapy in a comprehensive inpatient rehab setting. Physiatrist is providing close team supervision and 24 hour management of active medical problems listed below. Physiatrist and rehab team continue to assess barriers to  discharge/monitor patient progress toward functional and medical goals. FIM: FIM - Bathing Bathing Steps Patient Completed: Chest;Right Arm;Abdomen;Front perineal area;Right upper leg;Left upper leg Bathing: 3: Mod-Patient completes 5-7 9f 10 parts or 50-74%  FIM - Upper Body Dressing/Undressing Upper body dressing/undressing steps patient completed: Thread/unthread right sleeve of pullover shirt/dresss;Thread/unthread left sleeve of pullover shirt/dress;Put head through opening of pull over shirt/dress;Pull shirt over trunk Upper body dressing/undressing: 5: Supervision: Safety issues/verbal cues FIM - Lower Body Dressing/Undressing Lower body dressing/undressing steps patient completed: Thread/unthread right pants leg;Thread/unthread left pants leg Lower body dressing/undressing: 2: Max-Patient completed 25-49% of tasks  FIM - Toileting Toileting steps completed by patient: Performs perineal hygiene Toileting Assistive Devices: Grab bar or rail for support Toileting: 0: Activity did not occur  FIM - Diplomatic Services operational officer Devices: Elevated toilet seat;Grab bars Toilet Transfers: 4-To toilet/BSC: Min A (steadying Pt. > 75%);4-From toilet/BSC: Min A (steadying Pt. > 75%)  FIM - Bed/Chair Transfer Bed/Chair Transfer Assistive Devices: Bed rails;Arm rests;HOB elevated Bed/Chair Transfer: 5: Supine > Sit: Supervision (verbal cues/safety issues);5: Sit > Supine: Supervision (verbal cues/safety issues);4: Bed > Chair or W/C: Min A (steadying Pt. > 75%);4: Chair or W/C > Bed: Min A (steadying Pt. > 75%)  FIM - Locomotion: Wheelchair Distance: 200 Locomotion: Wheelchair: 6: Travels 150 ft or more, turns around, maneuvers to table, bed or toilet, negotiates 3% grade: maneuvers on rugs and over door sills independently FIM - Locomotion: Ambulation Ambulation/Gait Assistance: Not tested (comment) (unsafe at eval due to NWB RLE and RUE) Locomotion: Ambulation: 0: Activity did  not occur  Comprehension Comprehension Mode: Auditory Comprehension: 6-Follows complex conversation/direction: With extra time/assistive device  Expression Expression Mode: Verbal Expression: 6-Expresses complex ideas: With extra time/assistive device  Social Interaction Social Interaction Mode: Asleep Social Interaction: 6-Interacts appropriately with others with medication or extra time (anti-anxiety, antidepressant).  Problem Solving Problem Solving Mode: Asleep Problem Solving: 5-Solves complex 90% of the time/cues < 10% of the time  Memory Memory Mode: Asleep Memory: 6-More than reasonable amt of time  Medical Problem List and Plan:  1. DVT Prophylaxis/Anticoagulation: Pharmaceutical: Coumadin. Lovenox discontinued as INR therapeutic.  2. Pain Management: Will continue prn ultram.  3. Mood:  flat  4. Neuropsych: This patient is capable of making decisions on his/her own behalf.  5. Right anterior column/anterior wall acetabular Fx: No formal hip precautions. NWB if unable to TDWB RLE.   -dc'ed sutures from hypogastric area 6. Right distal humerus and Right distal radius fractue: NWB.  -adjustments in splint have held arm in place  -xrays last week showed good position.  -dressing changes as below.--cipro dc'ed 7. Leucocytosis: --  -no new issues  -will dc abx  -ortho follow up appreciated 8. Post op ileus: Continue reglan. Added miralax to help with bowel program. Suppository prn no BM in 24 hours.  9. Acute renal failure/Metabolic acidosis: continue per renal recs. Bun and cr continue to improve 10. DM Type 2: Will continue to monitor with AC/HS cbg checks. Hgb A1C @6 .5. Elevated BS likely due to stress. SSI for elevated BS and titrate medications as indicated. Off metformin due to renal failure. added Amaryl / off  lantus.  -fair control at present  11. GU: Incontinent of urine due to IVF and well as scrotal edema/penis retraction.  Marland Kitchen   UA/UCS negative  -elevate  scrotum--swelling improving daily  LOS (Days) 10 A FACE TO FACE EVALUATION WAS PERFORMED  Latonda Larrivee T 08/02/2012 9:09 AM

## 2012-08-03 ENCOUNTER — Inpatient Hospital Stay (HOSPITAL_COMMUNITY): Payer: 59

## 2012-08-03 ENCOUNTER — Inpatient Hospital Stay (HOSPITAL_COMMUNITY): Payer: 59 | Admitting: Occupational Therapy

## 2012-08-03 LAB — RENAL FUNCTION PANEL
BUN: 23 mg/dL (ref 6–23)
CO2: 23 mEq/L (ref 19–32)
Chloride: 109 mEq/L (ref 96–112)
Creatinine, Ser: 2.3 mg/dL — ABNORMAL HIGH (ref 0.50–1.35)
Potassium: 3.6 mEq/L (ref 3.5–5.1)

## 2012-08-03 LAB — GLUCOSE, CAPILLARY
Glucose-Capillary: 113 mg/dL — ABNORMAL HIGH (ref 70–99)
Glucose-Capillary: 112 mg/dL — ABNORMAL HIGH (ref 70–99)

## 2012-08-03 MED ORDER — BENEPROTEIN PO POWD
1.0000 | Freq: Three times a day (TID) | ORAL | Status: DC
  Administered 2012-08-03 – 2012-08-06 (×8): 6 g via ORAL
  Filled 2012-08-03: qty 227

## 2012-08-03 MED ORDER — WARFARIN SODIUM 2.5 MG PO TABS
2.5000 mg | ORAL_TABLET | Freq: Once | ORAL | Status: AC
  Administered 2012-08-03: 2.5 mg via ORAL
  Filled 2012-08-03: qty 1

## 2012-08-03 NOTE — Progress Notes (Signed)
Subjective/Complaints: No complaints. Good weekend.  A 12 point review of systems has been performed and if not noted above is otherwise negative.   Objective: Vital Signs: Blood pressure 151/75, pulse 80, temperature 98.4 F (36.9 C), temperature source Oral, resp. rate 18, height 5\' 9"  (1.753 m), weight 98.8 kg (217 lb 13 oz), SpO2 97.00%. No results found. No results found for this basename: WBC, HGB, HCT, PLT,  in the last 72 hours  Recent Labs  08/01/12 0550 08/02/12 0615  NA 144 146*  K 3.9 3.9  CL 110 110  GLUCOSE 130* 125*  BUN 31* 28*  CREATININE 2.76* 2.52*  CALCIUM 8.6 8.7   CBG (last 3)   Recent Labs  08/02/12 1638 08/02/12 2123 08/03/12 0706  GLUCAP 107* 121* 113*    Wt Readings from Last 3 Encounters:  08/03/12 98.8 kg (217 lb 13 oz)  07/12/12 102.059 kg (225 lb)  07/12/12 102.059 kg (225 lb)    Physical Exam:  Constitutional: He is oriented to person, place, and time. He appears well-developed and well-nourished.  Flat affect, no distress  HENT:  Head: Normocephalic and atraumatic.  Eyes: Pupils are equal, round, and reactive to light.  Neck: Normal range of motion.  Cardiovascular: Normal rate and regular rhythm.  Pulmonary/Chest: Effort normal and breath sounds normal.  Abdominal: He exhibits distension. There is no tenderness.  + BS.   Genitourinary:  scotum with trace edema Musculoskeletal: He exhibits edema.  RUE-1+ edema at hand and dependently at elbow.right arm dressed. Wounds dry. Trace to 1+ pitting edema RLE and LLE.  Neurological: He is alert and oriented to person, place, and time.  Speech measured at times but better. Follows basic commands without difficulty.    Skin: Skin is warm. See above Psychiatric: Thought content normal. His affect is blunt. He is slowed at times. Cognition and memory are fair. Reasonable insight and awareness In motor strength is 5/5 in the left deltoid, biceps, grip left lower extremity 4 minus at the  hip flexor knee extensor ankle dorsiflexor plantar flexor  Right lower extremity 3 minus hip flexor knee extensor 4 minus ankle dorsiflexor plantar flexor  Right upper extremity is in splint,     Assessment/Plan: 1. Functional deficits secondary to complex right hemipelvis fx, right elbow dislocation with distal radius fx, mild TBI which require 3+ hours per day of interdisciplinary therapy in a comprehensive inpatient rehab setting. Physiatrist is providing close team supervision and 24 hour management of active medical problems listed below. Physiatrist and rehab team continue to assess barriers to discharge/monitor patient progress toward functional and medical goals. FIM: FIM - Bathing Bathing Steps Patient Completed: Chest;Right Arm;Abdomen;Front perineal area;Right upper leg;Left upper leg;Right lower leg (including foot);Left lower leg (including foot) Bathing: 4: Min-Patient completes 8-9 23f 10 parts or 75+ percent  FIM - Upper Body Dressing/Undressing Upper body dressing/undressing steps patient completed: Thread/unthread right sleeve of pullover shirt/dresss;Thread/unthread left sleeve of pullover shirt/dress;Put head through opening of pull over shirt/dress;Pull shirt over trunk Upper body dressing/undressing: 5: Supervision: Safety issues/verbal cues FIM - Lower Body Dressing/Undressing Lower body dressing/undressing steps patient completed: Thread/unthread right pants leg;Thread/unthread left pants leg;Pull pants up/down;Don/Doff left sock Lower body dressing/undressing: 3: Mod-Patient completed 50-74% of tasks  FIM - Toileting Toileting steps completed by patient: Performs perineal hygiene (was wearing hospital gown) Toileting Assistive Devices: Grab bar or rail for support Toileting: 5: Supervision: Safety issues/verbal cues  FIM - Diplomatic Services operational officer Devices: Grab bars;Elevated toilet seat Toilet Transfers:  4-To toilet/BSC: Min A (steadying Pt. >  75%);4-From toilet/BSC: Min A (steadying Pt. > 75%)  FIM - Bed/Chair Transfer Bed/Chair Transfer Assistive Devices: Bed rails;Arm rests Bed/Chair Transfer: 4: Bed > Chair or W/C: Min A (steadying Pt. > 75%);4: Chair or W/C > Bed: Min A (steadying Pt. > 75%)  FIM - Locomotion: Wheelchair Distance: 200 Locomotion: Wheelchair: 6: Travels 150 ft or more, turns around, maneuvers to table, bed or toilet, negotiates 3% grade: maneuvers on rugs and over door sills independently FIM - Locomotion: Ambulation Ambulation/Gait Assistance: Not tested (comment) (unsafe at eval due to NWB RLE and RUE) Locomotion: Ambulation: 0: Activity did not occur  Comprehension Comprehension Mode: Auditory Comprehension: 6-Follows complex conversation/direction: With extra time/assistive device  Expression Expression Mode: Verbal Expression: 6-Expresses complex ideas: With extra time/assistive device  Social Interaction Social Interaction Mode: Asleep Social Interaction: 6-Interacts appropriately with others with medication or extra time (anti-anxiety, antidepressant).  Problem Solving Problem Solving Mode: Asleep Problem Solving: 5-Solves complex 90% of the time/cues < 10% of the time  Memory Memory Mode: Asleep Memory: 6-More than reasonable amt of time  Medical Problem List and Plan:  1. DVT Prophylaxis/Anticoagulation: Pharmaceutical: Coumadin. Lovenox discontinued as INR therapeutic.  2. Pain Management: Will continue prn ultram.  3. Mood:  flat  4. Neuropsych: This patient is capable of making decisions on his/her own behalf.  5. Right anterior column/anterior wall acetabular Fx: No formal hip precautions. NWB if unable to TDWB RLE.   -wounds clean 6. Right distal humerus and Right distal radius fractue: NWB.  -adjustments in splint have held arm in place  -xrays last week showed good position.  -dressing changes as below.--cipro dc'ed 7. Leukocytosis: --  -no new issues  -dc'ed abx  -ortho  follow up appreciated 8. Post op ileus: Continue reglan. Added miralax .  9. Acute renal failure/Metabolic acidosis: continue per renal recs. Bun and cr continue to improve 10. DM Type 2: Will continue to monitor with AC/HS cbg checks. Hgb A1C @6 .5. Elevated BS likely due to stress. SSI for elevated BS and titrate medications as indicated. Off metformin due to renal failure. added Amaryl / off lantus.  -good control at present  11. GU: Incontinent of urine due to IVF and well as scrotal edema/penis retraction.  Marland Kitchen   UA/UCS negative  -scrotal edema almost resolved  LOS (Days) 11 A FACE TO FACE EVALUATION WAS PERFORMED  SWARTZ,ZACHARY T 08/03/2012 8:24 AM

## 2012-08-03 NOTE — Progress Notes (Signed)
Physical Therapy Session Note  Patient Details  Name: Roger Palmer MRN: [id number] Date of Birth: Apr 05, 1951  Today's Date: 08/03/2012 Time: 0930-1030 Time Calculation (min): 60 min  Short Term Goals: Week 2:  PT Short Term Goal 1 (Week 2): = LTGS  Skilled Therapeutic Interventions/Progress Updates:    w/c propulsion on unit for endurance and strengthening, mod I; navigating off the unit and practicing w/c mobility getting on and off elevator, negotiating home environment as well. Focused on bed mobility with leg lifter and squat/stand pivot transfers in ADL apartment mod I/S overall; needed assist for set up of transfer only due to RUE restrictions (brakes and legrests). Supine therex for functional strengthening and ROM including bilateral heel slides, LAQ, and hip abduction with AAROM on RLE and 2# ankle weight on LLE x 10 reps x 3 sets.   Therapy Documentation Precautions:  Precautions Precautions: Fall Restrictions Weight Bearing Restrictions: Yes RUE Weight Bearing: Non weight bearing RLE Weight Bearing: Non weight bearing Other Position/Activity Restrictions: Gentle PROM Rt. elbow and wrist per Montez Morita, PA order 07/22/12; in chart prior to CIR eval    Pain:  Premedicated - no complaints.  See FIM for current functional status  Therapy/Group: Individual Therapy  Karolee Stamps [facility] 08/03/2012, 11:30 AM

## 2012-08-03 NOTE — Progress Notes (Signed)
ANTICOAGULATION CONSULT NOTE - Follow Up Consult  Pharmacy Consult for Coumadin Indication: VTE prophylaxis  No Known Allergies   Labs:  Recent Labs  08/01/12 0550 08/02/12 0615 08/03/12 0735  LABPROT 27.2*  --  29.7*  INR 2.68*  --  3.02*  CREATININE 2.76* 2.52* 2.30*    Estimated Creatinine Clearance: 38.6 ml/min (by C-G formula based on Cr of 2.3).   Medications:  Scheduled:  . darbepoetin (ARANESP) injection - NON-DIALYSIS  100 mcg Subcutaneous Q Sat-1800  . insulin aspart  0-15 Units Subcutaneous TID WC  . insulin aspart  0-5 Units Subcutaneous QHS  . loratadine  10 mg Oral Daily  . pantoprazole  40 mg Oral Daily  . polyethylene glycol  17 g Oral Daily  . tamsulosin  0.4 mg Oral Daily  . warfarin  5 mg Oral q1800  . Warfarin - Pharmacist Dosing Inpatient   Does not apply q1800    Assessment: 62 yo male s/p ortho surgery is currently on therapeutic coumadin.  INR is 3.02 today, and has been stable on 5mg  daily.  INR increase may be due to few doses of Cipro received  Goal of Therapy:  INR 2-3  Plan:  1) Coumadin 2.5 mg po x 1 today 2) Check INR in AM, but continue MWF checks  Thank you. Okey Regal, PharmD [phone]  08/03/2012, 10:17 AM

## 2012-08-03 NOTE — Progress Notes (Signed)
Elbow remains reduced but will repeat lateral view, and then see later today or tomorrow.  Myrene Galas, MD [facility] [phone] (p)

## 2012-08-03 NOTE — Progress Notes (Signed)
Occupational Therapy Session Notes  Patient Details  Name: Roger Palmer MRN: [id number] Date of Birth: 06-08-1950  Today's Date: 08/03/2012  Short Term Goals: Week 1:  OT Short Term Goal 1 (Week 1): Patient will perform UB dressing with moderate assistance OT Short Term Goal 1 - Progress (Week 1): Met OT Short Term Goal 2 (Week 1): Patient will perform LB dressing with maximal assistance OT Short Term Goal 2 - Progress (Week 1): Met OT Short Term Goal 3 (Week 1): Patient will complete grooming tassk of brushing teeth, washing face, and washing hands at sink with minimal assistance OT Short Term Goal 3 - Progress (Week 1): Met OT Short Term Goal 4 (Week 1): Patient will perform toilet transfer with moderate assistance OT Short Term Goal 4 - Progress (Week 1): Met  Week 2:  OT Short Term Goal 1 (Week 2): Short Term Goals = Long Term Goals  Skilled Therapeutic Interventions/Progress Updates:   Session #1 [phone] - 90 Minutes Individual Therapy No complaints of pain Patient found supine in bed eating breakfast. Patient engaged in bed mobility using leg lifter to increase independence, then patient performed edge of bed-> w/c transfer with supervision. From there patient gathered necessary items for ADL then patient propelled self from room -> ADL apartment with supervision. Patient transferred onto tub transfer bench and performed bathing at shower level using long handled sponge to increase independence. Therapist covered RUE and wound on left thigh. Patient then performed dressing at w/c level in sit<>stand position using AE (reacher, sock aid, and long handled shoe horn to increase independence). Therapist donned bilateral TED hose. Patient then propelled self back to room and therapist left patient seated in w/c to maneuver around room prn.   Session #2 1345-1430 - 45 Minutes Individual Therapy No complaints of pain Patient found seated in w/c with wife present in room. Patient  propelled w/c -> ADL apartment and used urinal, therapist then propelled patient -> central elevators -> 3rd floor solarium. While in the solarium focused skilled intervention on family education -> patient's wife and overall transfers on/off furniture. Family education focused on w/c management, transfers, and safety with transfers. Therapist propelled patient back to 4000 nurses station and patient propelled self back to room at mod I level. Wife present during entire session and plans to be present tomorrow [phone] for further OT/PT education.   Precautions:  Precautions Precautions: Fall Restrictions Weight Bearing Restrictions: Yes RUE Weight Bearing: Non weight bearing RLE Weight Bearing: Non weight bearing Other Position/Activity Restrictions: Gentle PROM Rt. elbow and wrist per Montez Morita, PA order 07/22/12; in chart prior to CIR eval  See FIM for current functional status  Anaiya Wisinski 08/03/2012, 7:38 AM

## 2012-08-04 ENCOUNTER — Inpatient Hospital Stay (HOSPITAL_COMMUNITY): Payer: 59 | Admitting: Occupational Therapy

## 2012-08-04 ENCOUNTER — Inpatient Hospital Stay (HOSPITAL_COMMUNITY): Payer: 59

## 2012-08-04 LAB — RENAL FUNCTION PANEL
Albumin: 3 g/dL — ABNORMAL LOW (ref 3.5–5.2)
BUN: 23 mg/dL (ref 6–23)
Creatinine, Ser: 2.16 mg/dL — ABNORMAL HIGH (ref 0.50–1.35)
Phosphorus: 3.5 mg/dL (ref 2.3–4.6)
Potassium: 3.7 mEq/L (ref 3.5–5.1)

## 2012-08-04 LAB — PROTIME-INR: Prothrombin Time: 26.6 seconds — ABNORMAL HIGH (ref 11.6–15.2)

## 2012-08-04 LAB — GLUCOSE, CAPILLARY: Glucose-Capillary: 123 mg/dL — ABNORMAL HIGH (ref 70–99)

## 2012-08-04 MED ORDER — FLUCONAZOLE 100 MG PO TABS
100.0000 mg | ORAL_TABLET | Freq: Every day | ORAL | Status: DC
  Filled 2012-08-04 (×2): qty 1

## 2012-08-04 MED ORDER — LOPERAMIDE HCL 2 MG PO CAPS
2.0000 mg | ORAL_CAPSULE | Freq: Two times a day (BID) | ORAL | Status: DC
  Filled 2012-08-04 (×2): qty 1

## 2012-08-04 MED ORDER — SACCHAROMYCES BOULARDII 250 MG PO CAPS
250.0000 mg | ORAL_CAPSULE | Freq: Two times a day (BID) | ORAL | Status: DC
  Filled 2012-08-04 (×2): qty 1

## 2012-08-04 MED ORDER — TRAZODONE HCL 50 MG PO TABS
50.0000 mg | ORAL_TABLET | Freq: Every evening | ORAL | Status: DC | PRN
  Administered 2012-08-04 – 2012-08-05 (×2): 100 mg via ORAL
  Filled 2012-08-04 (×2): qty 2

## 2012-08-04 MED ORDER — WARFARIN SODIUM 5 MG PO TABS
5.0000 mg | ORAL_TABLET | Freq: Every day | ORAL | Status: DC
  Administered 2012-08-04 – 2012-08-05 (×2): 5 mg via ORAL
  Filled 2012-08-04 (×3): qty 1

## 2012-08-04 NOTE — Progress Notes (Signed)
Occupational Therapy Session Notes  Patient Details  Name: Roger Palmer MRN: [id number] Date of Birth: Dec 18, 1950  Today's Date: 08/04/2012  Short Term Goals: Week 1:  OT Short Term Goal 1 (Week 1): Patient will perform UB dressing with moderate assistance OT Short Term Goal 1 - Progress (Week 1): Met OT Short Term Goal 2 (Week 1): Patient will perform LB dressing with maximal assistance OT Short Term Goal 2 - Progress (Week 1): Met OT Short Term Goal 3 (Week 1): Patient will complete grooming tassk of brushing teeth, washing face, and washing hands at sink with minimal assistance OT Short Term Goal 3 - Progress (Week 1): Met OT Short Term Goal 4 (Week 1): Patient will perform toilet transfer with moderate assistance OT Short Term Goal 4 - Progress (Week 1): Met  Week 2:  OT Short Term Goal 1 (Week 2): Short Term Goals = Long Term Goals  Skilled Therapeutic Interventions/Progress Updates:   Session #1 [phone] - 35 Minutes Individual Therapy No complaints of pain Patient found seated in recliner upon entering room with wife present. Wife set-up w/c in order for patient to transfer from recliner -> w/c. Patient then propelled w/c into bathroom for shower stall transfer. UB/LB bathing then performed at shower level. Patient transferred out of shower into w/c for UB/LB dressing in sit<>stand position at sink. Patient's wife then attempted to donn bilateral TED hose; needed assistance. Focused skilled intervention on family education -> wife, transfers, UB/LB bathing & dressing, sit<>stands, dynamic standing balance/tolerance/endurance, grooming tasks seated at sink, and overall activity tolerance/endurance. RN present to look at skin. Also, educated wife on right shoulder PROM/stretching exercises. At end of session left patient seated in w/c to maneuver around room prn, wife present in room.   Session #2 1300-1400 - 60 Minutes Individual Therapy No complaints of pain Patient found in  bathroom on elevated toilet seat. Patient performed toileting tasks with supervision and then transferred back to w/c at supervision level. Patient washed hands at sink then propelled self -> therapy gym for therapeutic exercise focusing on dynamic standing balance/tolerance/endurance, functional use of bilateral UEs, PROM/stretching -> right shoulder, education on use of theraputty for right hand, and overall activity tolerance/endurance. At end of session patient propelled self -> dayroom.   Precautions:  Precautions Precautions: Fall Restrictions Weight Bearing Restrictions: Yes RUE Weight Bearing: Non weight bearing RLE Weight Bearing: Non weight bearing Other Position/Activity Restrictions: Gentle PROM Rt. elbow and wrist per Montez Morita, PA order 07/22/12; in chart prior to CIR eval  See FIM for current functional status  Devory Mckinzie 08/04/2012, 7:33 AM

## 2012-08-04 NOTE — Progress Notes (Signed)
Physical Therapy Session Note  Patient Details  Name: Roger Palmer MRN: [id number] Date of Birth: [birth year]  Today's Date: 08/04/2012 Time: 1028-1128 Time Calculation (min): 60 min  Short Term Goals: Week 2:  PT Short Term Goal 1 (Week 2): = LTGS  Skilled Therapeutic Interventions/Progress Updates:    Treatment session focused on family education with pt and pt's wife in regards to basic transfers, w/c parts management, safety, car transfers, bed mobility, and HEP. Pt's wife able to return demonstrate all aspects safely and pt able to direct care for set up as needed. Pt overall S with all mobility; mod I for w/c mobility. Discussed recommendation for HHPT follow up and importance of continued therapy/HEP for continued recovery. Measured pt for w/c as well (18x18 with elevating legrests).   Therapy Documentation Precautions:  Precautions Precautions: Fall Restrictions Weight Bearing Restrictions: Yes RUE Weight Bearing: Non weight bearing RLE Weight Bearing: Non weight bearing Other Position/Activity Restrictions: Gentle PROM Rt. elbow and wrist per Montez Morita, PA order 07/22/12; in chart prior to CIR eval   Pain:  No complaints.   See FIM for current functional status  Therapy/Group: Individual Therapy  Karolee Stamps [facility] 08/04/2012, 11:29 AM

## 2012-08-04 NOTE — Progress Notes (Signed)
Physical Therapy Discharge Summary  Patient Details  Name: Roger Palmer MRN: [id number] Date of Birth: 1950-07-31  Today's Date: 08/05/2012  Session #1 Time: 1000-1056 (56 min) Individual therapy; No complaints of pain. Focused on grad day activities including w/c mobility on unit and in home environment, transfers and bed mobility (with leg lifter) in ADL apartment, and furniture transfers onto/off of couch (supervision for all with stand pivot technique - would be mod I if not needing set up assist). Dynamic standing balance and sit to stand activity reaching for horseshoes and placing overhead on basketball hoop with close S. Supine therex for LE strengthening and ROM with AAROM on RLE and 2# ankle weight on LLE; heel slides, SAQ, hip abduction, and LAQ with 5 second hold.   Session#2 Time: 1300-1330 (30 min) Individual therapy; No pain. Transferred out of recliner with S. W/c had been delivered from [facility]; therapist adjusted w/c legrests to fit pt appropriately and pt practiced w/c mobility on unit and on carpeted surface through obstacle course mod I.   Patient has met 7 of 7 long term goals due to improved activity tolerance, improved balance, increased strength, increased range of motion, decreased pain, ability to compensate for deficits and functional use of  right lower extremity (within restrictions). Patient to discharge at a wheelchair level Supervision for transfers and set-up; modified independent for w/c propulsion.  Patient's wife  is independent to provide the necessary set-up and supervision assistance at discharge. Pt is limited with w/c parts management for transfers due to orthopedic and WB restrictions.   Reasons goals not met: n/a  Recommendation:  Patient will benefit from ongoing skilled PT services in home health setting to continue to advance safe functional mobility, address ongoing impairments in strength, endurance, functional mobility, ROM, edema control, WB  restrictions, and minimize fall risk.  Equipment: 18x18 w/c with basic cushion and elevating legrests  Reasons for discharge: treatment goals met and discharge from hospital  Patient/family agrees with progress made and goals achieved: Yes  PT Discharge Precautions/Restrictions Precautions Precautions: Fall Required Braces or Orthoses: Other Brace/Splint Other Brace/Splint: RUE in splint/sling Restrictions Weight Bearing Restrictions: Yes RUE Weight Bearing: Non weight bearing RLE Weight Bearing: Non weight bearing  Vision/Perception  Vision - History Baseline Vision: Wears glasses all the time Perception Perception: Within Functional Limits Praxis Praxis: Intact  Cognition Overall Cognitive Status: Appears within functional limits for tasks assessed Memory: Appears intact Awareness: Appears intact Problem Solving: Appears intact Safety/Judgment: Appears intact Sensation Sensation Light Touch: Appears Intact (LEs) Proprioception: Appears Intact (in LE's) Coordination Gross Motor Movements are Fluid and Coordinated:  (yes in LE's; decreased strength on RLE) Motor  Motor Motor:  (RUE and RLE WB restrictions limiting)   Trunk/Postural Assessment  Cervical Assessment Cervical Assessment: Within Functional Limits Thoracic Assessment Thoracic Assessment:  (same as admission) Lumbar Assessment Lumbar Assessment:  (same as admission)  Balance Static Sitting Balance Static Sitting - Level of Assistance: 6: Modified independent (Device/Increase time) Dynamic Sitting Balance Dynamic Sitting - Level of Assistance: 6: Modified independent (Device/Increase time) Static Standing Balance Static Standing - Level of Assistance: 5: Supervision Dynamic Standing Balance Dynamic Standing - Level of Assistance: 4: Min assist to Supervision Extremity Assessment  RUE Assessment RUE Assessment: Exceptions to [facility] RUE AROM (degrees) RUE Overall AROM Comments: RUE in cast/aplint and  unable to fully assess. Patient unable to actively flex shoulder, therapist is able to perform PROM and stretching -> right shoulder with no pain  LUE Assessment LUE Assessment: Within  Functional Limits RLE Assessment RLE Assessment: Exceptions to [facility] RLE Strength RLE Overall Strength Comments: NWB RLE; 3-/5 at hip; knee and ankle 4+/5; edema significantly improved LLE Assessment LLE Assessment: Within Functional Limits  See FIM for current functional status  Karolee Stamps [facility] 08/05/2012, 2:58 PM

## 2012-08-04 NOTE — Patient Care Conference (Signed)
Inpatient RehabilitationTeam Conference and Plan of Care Update Date: 08/04/2012   Time: 2:05 PM    Patient Name: Roger Palmer      Medical Record Number: [id number]  Date of Birth: Oct 10, 1950 Sex: Male         Room/Bed: 4038/4038-01 Payor Info: Payor: Advertising copywriter  Plan: [employer]  Product Type: *No Product type*     Admitting Diagnosis: Polytrauma  Admit Date/Time:  07/23/2012  5:48 PM Admission Comments: No comment available   Primary Diagnosis:  Fall from tree Principal Problem: Fall from tree  Patient Active Problem List   Diagnosis Date Noted  . 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 Ileus 07/23/2012  . Acute kidney injury 07/20/2012  . Hypernatremia 07/20/2012  . Aspiration pneumonia 07/20/2012  . Acute respiratory failure following trauma and surgery 07/16/2012  . Acute blood loss anemia 07/14/2012  . Hypokalemia 07/14/2012  . Fall from tree 07/13/2012  . Hypercholesteremia   . Nephrolithiasis   . Hypertension   . Back pain   . Obesity   . BPH (benign prostatic hypertrophy)   . Gastritis   . Reflux   . Diabetes mellitus without complication   . Right acetabular fracture 07/11/2012  . Open dislocation of right elbow 07/11/2012  . Distal radius fracture, right 07/11/2012  . Fracture of iliac wing 07/11/2012  . Pubic ramus fracture 07/11/2012    Expected Discharge Date: Expected Discharge Date: 08/06/12  Team Members Present: Physician leading conference: Dr. Faith Rogue Social Worker Present: Amada Jupiter, LCSW Nurse Present: Daryll Brod, RN PT Present: Karolee Stamps, PT;Other (comment) Sherrine Maples, PT) OT Present: Edwin Cap, OT Other (Discipline and Name): Tora Duck, PPS Coordinator     Current Status/Progress Goal Weekly Team Focus  Medical   wounds clean, edema down. pain controlled  see prior, pain mgt   see prior, stablize medically for dc   Bowel/Bladder   Continent of bowel and bladder. LBM 08/02/12  Pt to  remain continent of bowel and bladder  Monitor   Swallow/Nutrition/ Hydration             ADL's   min assist UB ADLs, mod assist LB ADLs   overall supervision-min assist  ADL retraining, Use of AE prn, sit<>stands, dynamic standing, transfers, activity tolerance/endurance family education   Mobility   S/steady A transfers, mod i w/c mobility, min A standing  S transfers, mod I bed mobility and w/c mobility, min A standing balance  endurance, functional strengthening, transfers, family education   Communication             Safety/Cognition/ Behavioral Observations            Pain   Robaxin 500mg  q 6hrs prn, Tramadol 50mg  q 6hrs prn  <3  Offer pain medication 1 hr prior to therapy session   Skin   Decreased scrotum, BLE and trace edema to hand. R thigh incision with alleyvn dressing, Skin abrasion to bilateral posterior thigh with tegaderm intact. R arm in splint/sling  No additional skin breakdown  Monitor for appropriate healing and dressing change q shift    Rehab Goals Patient on target to meet rehab goals: Yes *See Interdisciplinary Assessment and Plan and progress notes for long and short-term goals  Barriers to Discharge: see prior, none new    Possible Resolutions to Barriers:  see prior    Discharge Planning/Teaching Needs:  Home with wife to provide 24/7 assistance      Team Discussion:  Ready for  d/c on Thursday.  Much better with swelling and skin issues.  Family education being completed.  Revisions to Treatment Plan:  None   Continued Need for Acute Rehabilitation Level of Care: The patient requires daily medical management by a physician with specialized training in physical medicine and rehabilitation for the following conditions: Daily direction of a multidisciplinary physical rehabilitation program to ensure safe treatment while eliciting the highest outcome that is of practical value to the patient.: Yes Daily medical management of patient stability for  increased activity during participation in an intensive rehabilitation regime.: Yes Daily analysis of laboratory values and/or radiology reports with any subsequent need for medication adjustment of medical intervention for : Post surgical problems;Cardiac problems  Dong Nimmons 08/04/2012, 4:55 PM

## 2012-08-04 NOTE — Progress Notes (Signed)
Subjective/Complaints: Has some problems sleeping in hospital bed. Otherwise doing well..  A 12 point review of systems has been performed and if not noted above is otherwise negative.   Objective: Vital Signs: Blood pressure 148/82, pulse 75, temperature 97.8 F (36.6 C), temperature source Oral, resp. rate 18, height 5\' 9"  (1.753 m), weight 98.1 kg (216 lb 4.3 oz), SpO2 95.00%. Dg Elbow 2 Views Right  08/03/2012  *RADIOLOGY REPORT*  Clinical Data: Radial head replacement  RIGHT ELBOW - 2 VIEW  Comparison: 07/31/2012  Findings: Single lateral view of the right elbow was obtained.  The patient is in a splint device which limits positioning.  No acute bony findings.  Fat pad elevation of the elbows consistent with joint effusion.  IMPRESSION: Stable.  No acute findings.   Original Report Authenticated By: Kennith Center, M.D.    No results found for this basename: WBC, HGB, HCT, PLT,  in the last 72 hours  Recent Labs  08/03/12 0735 08/04/12 0606  NA 143 145  K 3.6 3.7  CL 109 110  GLUCOSE 122* 141*  BUN 23 23  CREATININE 2.30* 2.16*  CALCIUM 8.9 9.0   CBG (last 3)   Recent Labs  08/03/12 1630 08/03/12 2015 08/04/12 0715  GLUCAP 112* 118* 131*    Wt Readings from Last 3 Encounters:  08/04/12 98.1 kg (216 lb 4.3 oz)  07/12/12 102.059 kg (225 lb)  07/12/12 102.059 kg (225 lb)    Physical Exam:  Constitutional: He is oriented to person, place, and time. He appears well-developed and well-nourished.  Flat affect, no distress  HENT:  Head: Normocephalic and atraumatic.  Eyes: Pupils are equal, round, and reactive to light.  Neck: Normal range of motion.  Cardiovascular: Normal rate and regular rhythm.  Pulmonary/Chest: Effort normal and breath sounds normal.  Abdominal: He exhibits distension. There is no tenderness.  + BS.   Genitourinary:  scotum with trace edema Musculoskeletal: He exhibits edema.  RUE-1+ edema at hand and dependently at elbow.right arm dressed.  Wounds dry. Trace to 1+ pitting edema RLE and LLE.  Neurological: He is alert and oriented to person, place, and time.  Speech measured at times but better. Follows basic commands without difficulty.    Skin: Skin is warm. See above Psychiatric: Thought content normal. His affect is blunt. He is slowed at times. Cognition and memory are fair. Reasonable insight and awareness In motor strength is 5/5 in the left deltoid, biceps, grip left lower extremity 4 minus at the hip flexor knee extensor ankle dorsiflexor plantar flexor  Right lower extremity 3 minus hip flexor knee extensor 4 minus ankle dorsiflexor plantar flexor  Right upper extremity is in splint,     Assessment/Plan: 1. Functional deficits secondary to complex right hemipelvis fx, right elbow dislocation with distal radius fx, mild TBI which require 3+ hours per day of interdisciplinary therapy in a comprehensive inpatient rehab setting. Physiatrist is providing close team supervision and 24 hour management of active medical problems listed below. Physiatrist and rehab team continue to assess barriers to discharge/monitor patient progress toward functional and medical goals. FIM: FIM - Bathing Bathing Steps Patient Completed: Chest;Right Arm;Left Arm;Abdomen;Front perineal area;Buttocks;Right upper leg;Left upper leg;Right lower leg (including foot);Left lower leg (including foot) Bathing: 5: Supervision: Safety issues/verbal cues  FIM - Upper Body Dressing/Undressing Upper body dressing/undressing steps patient completed: Thread/unthread right sleeve of pullover shirt/dresss;Thread/unthread left sleeve of pullover shirt/dress;Put head through opening of pull over shirt/dress;Pull shirt over trunk Upper body dressing/undressing: 4:  Steadying assist FIM - Lower Body Dressing/Undressing Lower body dressing/undressing steps patient completed: Thread/unthread right pants leg;Thread/unthread left pants leg;Pull pants up/down;Don/Doff  right sock;Don/Doff left shoe Lower body dressing/undressing: 5: Supervision: Safety issues/verbal cues (with use of AE)  FIM - Toileting Toileting steps completed by patient: Performs perineal hygiene (was wearing hospital gown) Toileting Assistive Devices: Grab bar or rail for support Toileting: 0: Activity did not occur  FIM - Diplomatic Services operational officer Devices: Grab bars;Elevated toilet seat Toilet Transfers: 0-Activity did not occur  FIM - Banker Devices:  (leg lifter) Bed/Chair Transfer: 6: Supine > Sit: No assist;6: Sit > Supine: No assist;5: Bed > Chair or W/C: Supervision (verbal cues/safety issues);5: Chair or W/C > Bed: Supervision (verbal cues/safety issues)  FIM - Locomotion: Wheelchair Distance: 200 Locomotion: Wheelchair: 6: Travels 150 ft or more, turns around, maneuvers to table, bed or toilet, negotiates 3% grade: maneuvers on rugs and over door sills independently FIM - Locomotion: Ambulation Ambulation/Gait Assistance: Not tested (comment) (unsafe at eval due to NWB RLE and RUE) Locomotion: Ambulation: 0: Activity did not occur  Comprehension Comprehension Mode: Auditory Comprehension: 6-Follows complex conversation/direction: With extra time/assistive device  Expression Expression Mode: Verbal Expression: 6-Expresses complex ideas: With extra time/assistive device  Social Interaction Social Interaction Mode: Asleep Social Interaction: 6-Interacts appropriately with others with medication or extra time (anti-anxiety, antidepressant).  Problem Solving Problem Solving Mode: Asleep Problem Solving: 6-Solves complex problems: With extra time  Memory Memory Mode: Asleep Memory: 6-More than reasonable amt of time  Medical Problem List and Plan:  1. DVT Prophylaxis/Anticoagulation: Pharmaceutical: Coumadin. Lovenox discontinued as INR therapeutic.  2. Pain Management: Will continue prn ultram.  3.  Mood:  In good spirits. Much improved 4. Neuropsych: This patient is capable of making decisions on his/her own behalf.  5. Right anterior column/anterior wall acetabular Fx: No formal hip precautions. NWB if unable to TDWB RLE.   -wounds clean 6. Right distal humerus and Right distal radius fractue: NWB.  -adjustments in splint have held arm in place  -xrays last week showed good position.  -dressing changes as below.--cipro dc'ed 7. Leukocytosis: --  -no new issues  -dc'ed abx  -ortho follow up appreciated 8. Post op ileus: Continue reglan. Added miralax .  9. Acute renal failure/Metabolic acidosis: continue per renal recs. Bun and cr continue to improve 10. DM Type 2: Will continue to monitor with AC/HS cbg checks. Hgb A1C @6 .5. Elevated BS likely due to stress. SSI for elevated BS and titrate medications as indicated. Off metformin due to renal failure. added Amaryl / off lantus.  -good control at present with current regimen 11. GU: scrotal edema resolved. Emptying bladder well.  LOS (Days) 12 A FACE TO FACE EVALUATION WAS PERFORMED  SWARTZ,ZACHARY T 08/04/2012 7:57 AM

## 2012-08-04 NOTE — Progress Notes (Signed)
Social Work Patient ID: Roger Palmer, male   DOB: 10/08/1950, 62 y.o.   MRN: [id number]  Have reviewed team conference with patient  - pleased with progress and feeling ready for Thursday d/c.  Aware of DME and HH follow up arranged.  No other concerns at this point.  Adisen Bennion, LCSW

## 2012-08-04 NOTE — Progress Notes (Signed)
ANTICOAGULATION CONSULT NOTE - Follow Up Consult  Pharmacy Consult for Coumadin Indication: VTE prophylaxis  No Known Allergies   Labs:  Recent Labs  08/02/12 0615 08/03/12 0735 08/04/12 0606  LABPROT  --  29.7* 26.6*  INR  --  3.02* 2.60*  CREATININE 2.52* 2.30* 2.16*    Estimated Creatinine Clearance: 41 ml/min (by C-G formula based on Cr of 2.16).   Medications:  Scheduled:  . darbepoetin (ARANESP) injection - NON-DIALYSIS  100 mcg Subcutaneous Q Sat-1800  . insulin aspart  0-15 Units Subcutaneous TID WC  . insulin aspart  0-5 Units Subcutaneous QHS  . loratadine  10 mg Oral Daily  . pantoprazole  40 mg Oral Daily  . polyethylene glycol  17 g Oral Daily  . protein supplement  1 scoop Oral TID WC  . tamsulosin  0.4 mg Oral Daily  . [COMPLETED] warfarin  2.5 mg Oral ONCE-1800  . Warfarin - Pharmacist Dosing Inpatient   Does not apply q1800  . [DISCONTINUED] warfarin  5 mg Oral q1800    Assessment: 62 yo male s/p ortho surgery is currently on therapeutic coumadin.  INR = 2.60 (stable)  Goal of Therapy:  INR 2-3  Plan:  1) Coumadin 5 mg po daily at 1800 2) MWF INR checks  Thank you. Okey Regal, PharmD [phone]  08/04/2012, 10:15 AM

## 2012-08-05 ENCOUNTER — Inpatient Hospital Stay (HOSPITAL_COMMUNITY): Payer: 59 | Admitting: Physical Therapy

## 2012-08-05 ENCOUNTER — Inpatient Hospital Stay (HOSPITAL_COMMUNITY): Payer: 59 | Admitting: Occupational Therapy

## 2012-08-05 ENCOUNTER — Inpatient Hospital Stay (HOSPITAL_COMMUNITY): Payer: 59

## 2012-08-05 LAB — RENAL FUNCTION PANEL
Albumin: 2.8 g/dL — ABNORMAL LOW (ref 3.5–5.2)
BUN: 19 mg/dL (ref 6–23)
CO2: 24 mEq/L (ref 19–32)
Chloride: 108 mEq/L (ref 96–112)
Creatinine, Ser: 2.02 mg/dL — ABNORMAL HIGH (ref 0.50–1.35)
Potassium: 3.3 mEq/L — ABNORMAL LOW (ref 3.5–5.1)

## 2012-08-05 LAB — GLUCOSE, CAPILLARY
Glucose-Capillary: 110 mg/dL — ABNORMAL HIGH (ref 70–99)
Glucose-Capillary: 103 mg/dL — ABNORMAL HIGH (ref 70–99)
Glucose-Capillary: 135 mg/dL — ABNORMAL HIGH (ref 70–99)

## 2012-08-05 LAB — PROTIME-INR
INR: 2.65 — ABNORMAL HIGH (ref 0.00–1.49)
Prothrombin Time: 27 seconds — ABNORMAL HIGH (ref 11.6–15.2)

## 2012-08-05 MED ORDER — POLYETHYLENE GLYCOL 3350 17 G PO PACK
17.0000 g | PACK | Freq: Every day | ORAL | Status: DC
  Administered 2012-08-05: 17 g via ORAL
  Filled 2012-08-05 (×3): qty 1

## 2012-08-05 NOTE — Progress Notes (Signed)
ANTICOAGULATION CONSULT NOTE - Follow Up Consult  Pharmacy Consult for Coumadin Indication: VTE prophylaxis  No Known Allergies   Labs:  Recent Labs  08/03/12 0735 08/04/12 0606 08/05/12 0515  LABPROT 29.7* 26.6* 27.0*  INR 3.02* 2.60* 2.65*  CREATININE 2.30* 2.16* 2.02*    Estimated Creatinine Clearance: 43.8 ml/min (by C-G formula based on Cr of 2.02).   Medications:  Scheduled:  . darbepoetin (ARANESP) injection - NON-DIALYSIS  100 mcg Subcutaneous Q Sat-1800  . insulin aspart  0-15 Units Subcutaneous TID WC  . insulin aspart  0-5 Units Subcutaneous QHS  . loratadine  10 mg Oral Daily  . pantoprazole  40 mg Oral Daily  . polyethylene glycol  17 g Oral Daily  . protein supplement  1 scoop Oral TID WC  . tamsulosin  0.4 mg Oral Daily  . warfarin  5 mg Oral q1800  . Warfarin - Pharmacist Dosing Inpatient   Does not apply q1800  . [DISCONTINUED] fluconazole  100 mg Oral Daily  . [DISCONTINUED] loperamide  2 mg Oral BID  . [DISCONTINUED] polyethylene glycol  17 g Oral Daily  . [DISCONTINUED] saccharomyces boulardii  250 mg Oral BID    Assessment: 62 yo male s/p ortho surgery is currently on therapeutic coumadin.  INR = 2.65 (stable) on coumadin 5mg  daily  Goal of Therapy:  INR 2-3  Plan:  1) Continue Coumadin 5 mg po daily at 1800 2) MWF INR checks  Thank you. Okey Regal, PharmD [phone]  08/05/2012, 11:45 AM

## 2012-08-05 NOTE — Progress Notes (Signed)
Physical Therapy Note  Patient Details  Name: Roger Palmer MRN: [id number] Date of Birth: 11-22-1950 Today's Date: 08/05/2012  1400-1435 (35 minutes) individual Pain: no pain reported Focus of treatment: therapeutic exercises focused on strengthening/AROM RT LE to improve functional mobility (sit >< supine) Treatment: AA minimal SLR on right using sheet for self assist ; sit >< supine maintaining LEs together SBA + sheet to self assist RT LE (X 4).    Verlin Uher,JIM 08/05/2012, 2:35 PM

## 2012-08-05 NOTE — Plan of Care (Signed)
Problem: RH SKIN INTEGRITY Goal: RH STG MAINTAIN SKIN INTEGRITY WITH ASSISTANCE STG Maintain Skin Integrity With min Assistance.  Outcome: Not Met (add Reason) Patient will require care giver to do dressing change on right arm while supporting that arm

## 2012-08-05 NOTE — Progress Notes (Signed)
Occupational Therapy Session Note & Discharge Summary  Patient Details  Name: Roger Palmer MRN: [id number] Date of Birth: 10/02/1950  Today's Date: 08/05/2012 SESSION NOTE Time: 0905-1000 Time Calculation (min): 55 min Patient found supine in bed. Patient engaged in bed mobility and performed supine -> sit at mod I level, then edge of bed -> w/c transfer with supervision. Patient then maneuvered around room to gather necessary items for ADL. From there patient performed shower stall transfer and UB/LB bathing at shower level. Patient transferred out of shower then performed UB/LB dressing in sit<>stand position at sink, requiring min assist to donn sock -> RLE. Grooming tasks performed in seated position at sink. Patient then propelled self -> therapy gym for next therapy session.  ---------------------------------------------------------------------------------------------------  DISCHARGE SUMMARY Patient has met 13 of 13 long term goals due to improved activity tolerance, improved balance, postural control, ability to compensate for deficits, functional use of  RIGHT upper extremity, improved attention, improved awareness and improved coordination.  Patient to discharge at overall supervision -> min assist (LB dressing) level.  Patient's care partner is independent to provide the necessary supervision & physical assistance prn assistance at discharge.    Reasons goals not met: n/a, all goals met at this time.  Recommendation:  Patient will benefit from ongoing skilled OT services in home health setting to continue to advance functional skills in the area of BADL, iADL and Reduce care partner burden.  Equipment: No equipment provided, patient and wife state they have access to a shower seat/bench and BSC.   Reasons for discharge: treatment goals met and discharge from hospital  Patient/family agrees with progress made and goals achieved: Yes  Precautions/Restrictions   Precautions Precautions: Fall Required Braces or Orthoses: Other Brace/Splint Other Brace/Splint: RUE in spling and sling Restrictions Weight Bearing Restrictions: Yes RUE Weight Bearing: Non weight bearing RLE Weight Bearing: Non weight bearing  ADL - See FIM   Vision/Perception  Vision - History Baseline Vision: Wears glasses all the time Patient Visual Report: No change from baseline Perception Perception: Within Functional Limits Praxis Praxis: Intact   Cognition Overall Cognitive Status: Appears within functional limits for tasks assessed Arousal/Alertness: Awake/alert Orientation Level: Oriented X4 Memory: Appears intact Awareness: Appears intact Problem Solving: Appears intact Safety/Judgment: Appears intact  Sensation Sensation Additional Comments: Patient continues to report some tingling in right hand/fingers, LUE appears intact Coordination Gross Motor Movements are Fluid and Coordinated: No Fine Motor Movements are Fluid and Coordinated: No Coordination and Movement Description: Left hand/UE is WFL, right hand/UE has decreased gross and fine motor movements & increased swelling. Right UE is in spling and sling and unable to fully assess  Motor - See Discharge Navigator   Mobility  - See Discharge Navigator   Trunk/Postural Assessment  - See Discharge Navigator   Balance - See Discharge Navigator   Extremity/Trunk Assessment RUE Assessment RUE Assessment: Exceptions to [facility] RUE AROM (degrees) RUE Overall AROM Comments: RUE in cast/aplint and unable to fully assess. Patient unable to actively flex shoulder, therapist is able to perform PROM and stretching -> right shoulder with no pain  LUE Assessment LUE Assessment: Within Functional Limits  See FIM for current functional status  Roger Palmer 08/05/2012, 10:37 AM

## 2012-08-05 NOTE — Progress Notes (Signed)
Subjective/Complaints: In good spirits. No complaints.  A 12 point review of systems has been performed and if not noted above is otherwise negative.   Objective: Vital Signs: Blood pressure 159/83, pulse 82, temperature 98.5 F (36.9 C), temperature source Oral, resp. rate 20, height 5\' 9"  (1.753 m), weight 98.1 kg (216 lb 4.3 oz), SpO2 96.00%. Dg Elbow 2 Views Right  08/03/2012  *RADIOLOGY REPORT*  Clinical Data: Radial head replacement  RIGHT ELBOW - 2 VIEW  Comparison: 07/31/2012  Findings: Single lateral view of the right elbow was obtained.  The patient is in a splint device which limits positioning.  No acute bony findings.  Fat pad elevation of the elbows consistent with joint effusion.  IMPRESSION: Stable.  No acute findings.   Original Report Authenticated By: Kennith Center, M.D.    No results found for this basename: WBC, HGB, HCT, PLT,  in the last 72 hours  Recent Labs  08/04/12 0606 08/05/12 0515  NA 145 142  K 3.7 3.3*  CL 110 108  GLUCOSE 141* 131*  BUN 23 19  CREATININE 2.16* 2.02*  CALCIUM 9.0 8.8   CBG (last 3)   Recent Labs  08/04/12 1627 08/04/12 2042 08/05/12 0714  GLUCAP 109* 123* 110*    Wt Readings from Last 3 Encounters:  08/04/12 98.1 kg (216 lb 4.3 oz)  07/12/12 102.059 kg (225 lb)  07/12/12 102.059 kg (225 lb)    Physical Exam:  Constitutional: He is oriented to person, place, and time. He appears well-developed and well-nourished.  Flat affect, no distress  HENT:  Head: Normocephalic and atraumatic.  Eyes: Pupils are equal, round, and reactive to light.  Neck: Normal range of motion.  Cardiovascular: Normal rate and regular rhythm.  Pulmonary/Chest: Effort normal and breath sounds normal.  Abdominal: He exhibits distension. There is no tenderness.  + BS.   Genitourinary:  scotum normal size Musculoskeletal: He exhibits edema.  RUE-1+ edema at hand and dependently at elbow.right arm dressed. Wounds dry. Trace to 1+ pitting edema RLE  and LLE.  Neurological: He is alert and oriented to person, place, and time.  Speech measured at times but better. Follows basic commands without difficulty.    Skin: Skin is warm. See above Psychiatric: Thought content normal. His affect is blunt. He is slowed at times. Cognition and memory are fair. Reasonable insight and awareness In motor strength is 5/5 in the left deltoid, biceps, grip left lower extremity 4 minus at the hip flexor knee extensor ankle dorsiflexor plantar flexor  Right lower extremity 3 minus hip flexor knee extensor 4 minus ankle dorsiflexor plantar flexor  Right upper extremity is in splint,     Assessment/Plan: 1. Functional deficits secondary to complex right hemipelvis fx, right elbow dislocation with distal radius fx, mild TBI which require 3+ hours per day of interdisciplinary therapy in a comprehensive inpatient rehab setting. Physiatrist is providing close team supervision and 24 hour management of active medical problems listed below. Physiatrist and rehab team continue to assess barriers to discharge/monitor patient progress toward functional and medical goals. FIM: FIM - Bathing Bathing Steps Patient Completed: Chest;Right Arm;Left Arm;Abdomen;Front perineal area;Buttocks;Right upper leg;Left upper leg;Right lower leg (including foot);Left lower leg (including foot) Bathing: 5: Supervision: Safety issues/verbal cues  FIM - Upper Body Dressing/Undressing Upper body dressing/undressing steps patient completed: Thread/unthread right sleeve of pullover shirt/dresss;Thread/unthread left sleeve of pullover shirt/dress;Put head through opening of pull over shirt/dress;Pull shirt over trunk Upper body dressing/undressing: 4: Steadying assist FIM - Lower Body  Dressing/Undressing Lower body dressing/undressing steps patient completed: Thread/unthread right pants leg;Thread/unthread left pants leg;Pull pants up/down;Don/Doff left shoe;Fasten/unfasten left shoe Lower  body dressing/undressing: 4: Min-Patient completed 75 plus % of tasks  FIM - Toileting Toileting steps completed by patient: Performs perineal hygiene (was wearing hospital gown) Toileting Assistive Devices: Grab bar or rail for support Toileting: 0: Activity did not occur  FIM - Diplomatic Services operational officer Devices: Grab bars;Elevated toilet seat Toilet Transfers: 0-Activity did not occur  FIM - Banker Devices: Arm rests Bed/Chair Transfer: 6: Supine > Sit: No assist;6: Sit > Supine: No assist;5: Bed > Chair or W/C: Supervision (verbal cues/safety issues);5: Chair or W/C > Bed: Supervision (verbal cues/safety issues)  FIM - Locomotion: Wheelchair Distance: 200 Locomotion: Wheelchair: 6: Travels 150 ft or more, turns around, maneuvers to table, bed or toilet, negotiates 3% grade: maneuvers on rugs and over door sills independently FIM - Locomotion: Ambulation Ambulation/Gait Assistance: Not tested (comment) (unsafe at eval due to NWB RLE and RUE) Locomotion: Ambulation: 0: Activity did not occur  Comprehension Comprehension Mode: Auditory Comprehension: 6-Follows complex conversation/direction: With extra time/assistive device  Expression Expression Mode: Verbal Expression: 6-Expresses complex ideas: With extra time/assistive device  Social Interaction Social Interaction Mode: Asleep Social Interaction: 6-Interacts appropriately with others with medication or extra time (anti-anxiety, antidepressant).  Problem Solving Problem Solving Mode: Asleep Problem Solving: 6-Solves complex problems: With extra time  Memory Memory Mode: Asleep Memory: 6-More than reasonable amt of time  Medical Problem List and Plan:  1. DVT Prophylaxis/Anticoagulation: Pharmaceutical: Coumadin. Lovenox discontinued as INR therapeutic.  2. Pain Management: Will continue prn ultram.  3. Mood:  In good spirits. Much improved 4. Neuropsych: This  patient is capable of making decisions on his/her own behalf.  5. Right anterior column/anterior wall acetabular Fx: No formal hip precautions. NWB if unable to TDWB RLE.   -wounds clean 6. Right distal humerus and Right distal radius fractue: NWB.  -adjustments in splint have held arm in place  -dressing changes daily to qod 7. Leukocytosis: --  -no new issues  -dc'ed abx  -ortho follow up appreciated 8. Post op ileus: continue miralax .  9. Acute renal failure/Metabolic acidosis: continue per renal recs. Bun and cr normalizing 10. DM Type 2: Will continue to monitor with AC/HS cbg checks. Hgb A1C @6 .5. Elevated BS likely due to stress. SSI for elevated BS and titrate medications as indicated. Off metformin due to renal failure. added Amaryl / off lantus.  -good control at present with current regimen 11. GU: scrotal edema resolved. Emptying bladder well.  LOS (Days) 13 A FACE TO FACE EVALUATION WAS PERFORMED  SWARTZ,ZACHARY T 08/05/2012 8:36 AM

## 2012-08-06 LAB — RENAL FUNCTION PANEL
Albumin: 2.8 g/dL — ABNORMAL LOW (ref 3.5–5.2)
BUN: 19 mg/dL (ref 6–23)
CO2: 23 mEq/L (ref 19–32)
Chloride: 109 mEq/L (ref 96–112)
Creatinine, Ser: 1.86 mg/dL — ABNORMAL HIGH (ref 0.50–1.35)
GFR calc non Af Amer: 37 mL/min — ABNORMAL LOW (ref >90)
Potassium: 3.5 mEq/L (ref 3.5–5.1)

## 2012-08-06 LAB — GLUCOSE, CAPILLARY: Glucose-Capillary: 117 mg/dL — ABNORMAL HIGH (ref 70–99)

## 2012-08-06 MED ORDER — BENEPROTEIN PO POWD: 1.0000 | Freq: Three times a day (TID) | ORAL | Status: DC

## 2012-08-06 MED ORDER — WARFARIN SODIUM 5 MG PO TABS: 5.0000 mg | ORAL_TABLET | Freq: Every day | ORAL | Status: DC

## 2012-08-06 MED ORDER — METHOCARBAMOL 500 MG PO TABS: 500.0000 mg | ORAL_TABLET | Freq: Four times a day (QID) | ORAL | Status: DC | PRN

## 2012-08-06 MED ORDER — POLYETHYLENE GLYCOL 3350 17 G PO PACK: 17.0000 g | PACK | Freq: Every day | ORAL | Status: DC

## 2012-08-06 MED ORDER — LORATADINE 10 MG PO TABS: 10.0000 mg | ORAL_TABLET | Freq: Every day | ORAL | Status: DC

## 2012-08-06 NOTE — Discharge Summary (Signed)
Physician Discharge Summary  Patient ID: Roger Palmer MRN: [id number] DOB/AGE: [birth year] 62 y.o.  Admit date: 07/23/2012 Discharge date: 08/06/2012  Discharge Diagnoses:  Principal Problem:   Fall from tree Active Problems:   Right acetabular fracture   Open dislocation of right elbow   Distal radius fracture, right   Hypertension   BPH (benign prostatic hypertrophy)   Diabetes mellitus without complication   Acute blood loss anemia   Acute kidney injury   Hypernatremia   Ileus   Brain injury without skull fracture   Discharged Condition: Good  Significant Diagnostic Studies:     Dg Elbow 2 Views Right  08/03/2012  *RADIOLOGY REPORT*  Clinical Data: Radial head replacement  RIGHT ELBOW - 2 VIEW  Comparison: 07/31/2012  Findings: Single lateral view of the right elbow was obtained.  The patient is in a splint device which limits positioning.  No acute bony findings.  Fat pad elevation of the elbows consistent with joint effusion.  IMPRESSION: Stable.  No acute findings.   Original Report Authenticated By: Kennith Center, M.D.    Dg Elbow 2 Views Right  07/31/2012  *RADIOLOGY REPORT*  Clinical Data: Trauma, postop  RIGHT ELBOW - 2 VIEW  Comparison: Right elbow films of 07/24/2012  Findings: On the views obtained a prosthetic radial head appears to be in good position, and alignment is grossly normal.  There is soft tissue swelling over the olecranon.  No definite joint effusion is noted.  IMPRESSION: Grossly normal alignment.  Soft tissue swelling over the olecranon.   Original Report Authenticated By: Dwyane Dee, M.D.    Dg Elbow 2 Views Right  07/24/2012  *RADIOLOGY REPORT*  Clinical Data: Elbow subluxation.  RIGHT ELBOW - 2 VIEW  Comparison: 07/23/2012  Findings: Radial head prosthesis remains in appropriate position. No evidence of hardware failure or loosening.  No evidence of fracture.  There has been interval reduction of the previously seen ulnar subluxation.  IMPRESSION:  Interval reduction of previously seen ulnar subluxation.  No evidence of fracture or dislocation.   Original Report Authenticated By: Myles Rosenthal, M.D.     Labs:  Basic Metabolic Panel:  Recent Labs Lab 07/31/12 0615 08/01/12 4098 08/02/12 1191 08/03/12 4782 08/04/12 0606 08/05/12 0515 08/06/12 0615  NA 145 144 146* 143 145 142 143  K 3.8 3.9 3.9 3.6 3.7 3.3* 3.5  CL 111 110 110 109 110 108 109  CO2 21 22 23 23 21 24 23   GLUCOSE 136* 130* 125* 122* 141* 131* 126*  BUN 34* 31* 28* 23 23 19 19   CREATININE 3.05* 2.76* 2.52* 2.30* 2.16* 2.02* 1.86*  CALCIUM 8.8 8.6 8.7 8.9 9.0 8.8 8.7  PHOS 4.2 3.8 3.7 3.4 3.5 3.6 3.6    CBC: No results found for this basename: WBC, NEUTROABS, HGB, HCT, MCV, PLT,  in the last 168 hours  CBG:  Recent Labs Lab 08/05/12 0714 08/05/12 1149 08/05/12 1650 08/05/12 2107 08/06/12 0726  GLUCAP 110* 93 103* 135* 117*    HPI:   Roger Palmer is a 62 y.o. male with history of DM, DDD, who sustained a fall of about 15 ft when he fell out of a tree on 07/11/2012. Pt was using a chainsaw, cutting branches from a tree when he fell off a ladder, landing on his R side. Work up in ED revealed open right elbow dislocation, right distal radius fracture,displaced right iliac fracture extending to superior and medial acetabular walls and non displaced Right inferior pubic rami fracture.  Pt was taken to the OR for I&D with ORIF of his R elbow along with a R radial head replacement and ORIF of the R wrist. Dr. Carola Frost was consulted for input and patient underwent ORIF right acetabulum on 03/04 and is NWB on RLE Was extubated without difficulty on 03/06.  Post op has had problems with ileus and urinary retention. CT abdomen with Mild BPH and non obstructing stones left kidney. Renal service has followed patient for renal failure due to ATN from contrast and hypotension in face of ACE and ARB.  Creatinine plateau ed at 4.3-4.5 range. Metabolic acidosis was treated with  NaHCO3 IVF. Right elbow dressing changed and patient placed in posterior splint on 03/12 with recommendations to begin gentle ROM.  Post op ileus resolving and patient tolerating regular diet. Follow up elbow films ordered and revealed right elbow subluxation and Dr. Mina Marble contacted. Therapy team recommended CIR level therapies and patient admitted for further therapies.     Hospital Course: Roger Palmer was admitted to rehab 07/23/2012 for inpatient therapies to consist of PT, ST and OT at least three hours five days a week. Past admission physiatrist, therapy team and rehab RN have worked together to provide customized collaborative inpatient rehab. Rehab Rn has worked with patient on B/B program and wound care monitoring. He was started on miralax and as constipation and ileus symptoms resolved, he was taken off Reglan. He was maintained on IVF and as renal status started improving, these were discontinued and nephrology signed off. BUN/Creatinine has greatly improved by discharge. Scrotal edema was treated with elevation. As mobility improved and edema resolved voiding problems have resolved.  ABLA has been stable at 8.3/24.5. He was treated with IV iron X 4 doses as well as    Patient's right elbow was reduced by Dr Mina Marble and brace readjusted to greater than 90 degrees flexion. Follow up xrays show good alinement. He was treated with short cipro due to wound drainage. Dr. Merlyn Lot was consulted and sutures removed and no signs of infection noted. Wound was clean, dry and healing well at time of discharge. Patient was maintained on coumadin for DVT prophylaxis and is to continue on this while immobile. HH to follow Dr. Magdalene Patricia protocol.  Po intake has been good. Blood sugars were monitored with ac/hs cbg checks. BS have been well controlled and were ranging  100-130 range. SSI was used as needed for elevated BS. Patient advised to continue Carb modified diet and follow up with Dr. Christell Constant input need  for  alternative oral medication.  Overall endurance levels had greatly improved at time of discharge.    During patient's stay in rehab weekly team conferences were held to monitor patient's progress, set goals and discuss barriers to discharge. Speech therapy evaluated patient and as patient felt that his cognitive skills were at baseline, they signed off.  Occupational therapy has worked with patient on ADL tasks. Patient progressed to supervision level to min assist for lower body dressing tasks.     Physical therapy has worked with patient on mobility, balance and strenghtening. Patient was discharged at Supervision for transfers and set-up from wheelchair. He's  modified independent for w/c propulsion. Patient's wife was educated on shower transfers as well as need for  necessary set-up and supervision assistance at discharge.       Disposition: 90-DC/txfr to inpt rehab facility with planned acute care hosp IP admission   Future Appointments Provider Department Dept Phone   08/18/2012 10:15 AM Dorinda Hill  Hoy Register, MD [facility] [phone]   09/08/2012 9:20 AM Ranelle Oyster, MD [facility] Medicine [facility] Rehabilitation [phone]   11/11/2012 8:00 AM Ernestina Penna, MD [facility] [phone]       Medication List    STOP taking these medications       metFORMIN 500 MG tablet  Commonly known as:  GLUCOPHAGE     quinapril 40 MG tablet  Commonly known as:  ACCUPRIL     valsartan-hydrochlorothiazide 160-25 MG per tablet  Commonly known as:  DIOVAN-HCT      TAKE these medications       atorvastatin 40 MG tablet  Commonly known as:  LIPITOR  Take 40 mg by mouth daily.     dutasteride 0.5 MG capsule  Commonly known as:  AVODART  Take 0.5 mg by mouth daily.     fenofibrate micronized 134 MG capsule  Commonly known as:  LOFIBRA  Take 134 mg by mouth daily before breakfast.     FLOMAX 0.4 MG Caps  Generic drug:  tamsulosin   Take 0.4 mg by mouth daily.     loratadine 10 MG tablet  Commonly known as:  CLARITIN  Take 1 tablet (10 mg total) by mouth daily.     methocarbamol 500 MG tablet  Commonly known as:  ROBAXIN  Take 1 tablet (500 mg total) by mouth every 6 (six) hours as needed.     omeprazole 20 MG capsule  Commonly known as:  PRILOSEC  Take 20 mg by mouth daily.     polyethylene glycol packet  Commonly known as:  MIRALAX / GLYCOLAX  Take 17 g by mouth daily.     protein supplement Powd  Take 6 g by mouth 3 (three) times daily with meals.     warfarin 5 MG tablet  Commonly known as:  COUMADIN  Take 1 tablet (5 mg total) by mouth daily at 6 PM. Take 1/2 a pill on Wed and Sat.  Take a whole pill all other days.           Follow-up Information   Follow up with Ranelle Oyster, MD On 09/08/2012. (Be there at 9 am for 9:20 am appointment)    Contact information:   [address], [address] [place] [address] [phone]       Follow up with Budd Palmer, MD. Call today. (for follow up appointment in 1-2 weeks. )    Contact information:   [address] [place]lyn [place] [address] [phone]       Follow up with Marlowe Shores, MD. Call today. (for follow up appointment in 7-10 days.)    Contact information:   [address] [place] [address] [phone]       Follow up with Dyke Maes, MD. (As needed for renal/kidney issues)    Contact information:   [address] [place] [address] [phone]       Follow up with Rudi Heap, MD On 08/18/2012. (@ 10:15 am)    Contact information:   [address] [place] [address] [phone]       Signed: Jacquelynn Cree 08/06/2012, 10:55 AM  CC: Dr. Rudi Heap         Dr Dairl Ponder         Dr. Myrene Galas         Dr. Brandy Hale

## 2012-08-06 NOTE — Progress Notes (Signed)
Social Work  Discharge Note  The overall goal for the admission was met for:   Discharge location: Yes - home with wife to assist  Length of Stay: Yes - 14 days  Discharge activity level: Yes - supervision to minimal assistance  Home/community participation: Yes  Services provided included: MD, RD, PT, OT, RN, TR, Pharmacy and SW  Financial Services: Private Insurance: [facility]  Follow-up services arranged: Home Health: RN, PT, OT via [facility], DME: [employer] w/c with ELRs, cushion via [facility] and Patient/Family has no preference for HH/DME agencies  Comments (or additional information):  Patient/Family verbalized understanding of follow-up arrangements: Yes  Individual responsible for coordination of the follow-up plan: patient  Confirmed correct DME delivered: Coron Rossano 08/06/2012    Airanna Partin

## 2012-08-06 NOTE — Progress Notes (Signed)
Subjective/Complaints: In good spirits. No complaints. Set to go A 12 point review of systems has been performed and if not noted above is otherwise negative.   Objective: Vital Signs: Blood pressure 162/82, pulse 82, temperature 98.2 F (36.8 C), temperature source Oral, resp. rate 20, height 5\' 9"  (1.753 m), weight 94.5 kg (208 lb 5.4 oz), SpO2 95.00%. No results found. No results found for this basename: WBC, HGB, HCT, PLT,  in the last 72 hours  Recent Labs  08/05/12 0515 08/06/12 0615  NA 142 143  K 3.3* 3.5  CL 108 109  GLUCOSE 131* 126*  BUN 19 19  CREATININE 2.02* 1.86*  CALCIUM 8.8 8.7   CBG (last 3)   Recent Labs  08/05/12 1650 08/05/12 2107 08/06/12 0726  GLUCAP 103* 135* 117*    Wt Readings from Last 3 Encounters:  08/06/12 94.5 kg (208 lb 5.4 oz)  07/12/12 102.059 kg (225 lb)  07/12/12 102.059 kg (225 lb)    Physical Exam:  Constitutional: He is oriented to person, place, and time. He appears well-developed and well-nourished.  Flat affect, no distress  HENT:  Head: Normocephalic and atraumatic.  Eyes: Pupils are equal, round, and reactive to light.  Neck: Normal range of motion.  Cardiovascular: Normal rate and regular rhythm.  Pulmonary/Chest: Effort normal and breath sounds normal.  Abdominal: He exhibits distension. There is no tenderness.  + BS.   Genitourinary:  scotum normal size Musculoskeletal: He exhibits edema.  RUE-1+ edema at hand and dependently at elbow.right arm dressed. Wounds dry. Trace to 1+ pitting edema RLE and LLE.  Neurological: He is alert and oriented to person, place, and time.  Speech measured at times but better. Follows basic commands without difficulty.    Skin: Skin is warm. See above Psychiatric: Thought content normal. His affect is blunt. He is slowed at times. Cognition and memory are fair. Reasonable insight and awareness In motor strength is 5/5 in the left deltoid, biceps, grip left lower extremity 4 minus  at the hip flexor knee extensor ankle dorsiflexor plantar flexor  Right lower extremity 3 minus hip flexor knee extensor 4 minus ankle dorsiflexor plantar flexor  Right upper extremity is in splint,     Assessment/Plan: 1. Functional deficits secondary to complex right hemipelvis fx, right elbow dislocation with distal radius fx, mild TBI which require 3+ hours per day of interdisciplinary therapy in a comprehensive inpatient rehab setting. Physiatrist is providing close team supervision and 24 hour management of active medical problems listed below. Physiatrist and rehab team continue to assess barriers to discharge/monitor patient progress toward functional and medical goals.  Dc today. Goals met.   FIM: FIM - Bathing Bathing Steps Patient Completed: Chest;Right Arm;Left Arm;Abdomen;Front perineal area;Buttocks;Right upper leg;Left upper leg;Right lower leg (including foot);Left lower leg (including foot) Bathing: 5: Supervision: Safety issues/verbal cues  FIM - Upper Body Dressing/Undressing Upper body dressing/undressing steps patient completed: Thread/unthread right sleeve of pullover shirt/dresss;Thread/unthread left sleeve of pullover shirt/dress;Put head through opening of pull over shirt/dress;Pull shirt over trunk Upper body dressing/undressing: 5: Supervision: Safety issues/verbal cues FIM - Lower Body Dressing/Undressing Lower body dressing/undressing steps patient completed: Thread/unthread right pants leg;Thread/unthread left pants leg;Pull pants up/down;Don/Doff left shoe;Fasten/unfasten left shoe Lower body dressing/undressing: 4: Min-Patient completed 75 plus % of tasks  FIM - Toileting Toileting steps completed by patient: Adjust clothing prior to toileting;Performs perineal hygiene;Adjust clothing after toileting Toileting Assistive Devices: Grab bar or rail for support Toileting: 5: Supervision: Safety issues/verbal cues  FIM -  Scientist, research (physical sciences) Devices: Elevated toilet seat Toilet Transfers: 5-To toilet/BSC: Supervision (verbal cues/safety issues);5-From toilet/BSC: Supervision (verbal cues/safety issues)  FIM - Press photographer Assistive Devices: Arm rests (leg lifter) Bed/Chair Transfer: 6: Supine > Sit: No assist;6: Sit > Supine: No assist;5: Bed > Chair or W/C: Supervision (verbal cues/safety issues);5: Chair or W/C > Bed: Supervision (verbal cues/safety issues)  FIM - Locomotion: Wheelchair Distance: 200 Locomotion: Wheelchair: 6: Travels 150 ft or more, turns around, maneuvers to table, bed or toilet, negotiates 3% grade: maneuvers on rugs and over door sills independently FIM - Locomotion: Ambulation Ambulation/Gait Assistance: Not tested (comment) (unsafe at eval due to NWB RLE and RUE) Locomotion: Ambulation: 0: Activity did not occur (due to WB restrictions)  Comprehension Comprehension Mode: Auditory Comprehension: 6-Follows complex conversation/direction: With extra time/assistive device  Expression Expression Mode: Verbal Expression: 5-Expresses basic needs/ideas: With extra time/assistive device  Social Interaction Social Interaction Mode: Asleep Social Interaction: 6-Interacts appropriately with others with medication or extra time (anti-anxiety, antidepressant).  Problem Solving Problem Solving Mode: Asleep Problem Solving: 6-Solves complex problems: With extra time  Memory Memory Mode: Asleep Memory: 6-More than reasonable amt of time  Medical Problem List and Plan:  1. DVT Prophylaxis/Anticoagulation: Pharmaceutical: Coumadin to continue until weight bearing LE/mobile  -dental procedure after coumadin 2. Pain Management: Will continue prn ultram.  3. Mood:  In good spirits. Much improved 4. Neuropsych: This patient is capable of making decisions on his/her own behalf.  5. Right anterior column/anterior wall acetabular Fx: No formal hip precautions. NWB if unable to  TDWB RLE.   -wounds clean 6. Right distal humerus and Right distal radius fractue: NWB.  -adjustments in splint have held arm in place  -dressing changes daily to qod 7. Leukocytosis: --  -no new issues  -dc'ed abx  -ortho follow up appreciated 8. Post op ileus: continue miralax .  9. Acute renal failure/Metabolic acidosis: continue per renal recs. Bun and cr normalizing 10. DM Type 2: Will continue to monitor with AC/HS cbg checks. Hgb A1C @6 .5. Elevated BS likely due to stress. SSI for elevated BS and titrate medications as indicated. Off metformin due to renal failure. added Amaryl / off lantus.  -good control at present with current regimen 11. GU: scrotal edema resolved. Emptying bladder well.  LOS (Days) 14 A FACE TO FACE EVALUATION WAS PERFORMED  SWARTZ,ZACHARY T 08/06/2012 7:55 AM

## 2012-08-06 NOTE — Progress Notes (Signed)
Patient and wife received discharge instructions from Marissa Nestle, [place].  All questions answered.  Patient discharged home with wife.  Yolonda Kida, NT escorted patient via wheelchair to family's car.

## 2012-08-14 ENCOUNTER — Encounter: Payer: Self-pay | Admitting: Family Medicine

## 2012-08-14 ENCOUNTER — Ambulatory Visit (INDEPENDENT_AMBULATORY_CARE_PROVIDER_SITE_OTHER): Payer: 59 | Admitting: Family Medicine

## 2012-08-14 VITALS — BP 130/82 | HR 72

## 2012-08-14 DIAGNOSIS — E785 Hyperlipidemia, unspecified: Secondary | ICD-10-CM

## 2012-08-14 DIAGNOSIS — E119 Type 2 diabetes mellitus without complications: Secondary | ICD-10-CM

## 2012-08-14 DIAGNOSIS — E870 Hyperosmolality and hypernatremia: Secondary | ICD-10-CM

## 2012-08-14 DIAGNOSIS — R209 Unspecified disturbances of skin sensation: Secondary | ICD-10-CM

## 2012-08-14 DIAGNOSIS — N400 Enlarged prostate without lower urinary tract symptoms: Secondary | ICD-10-CM

## 2012-08-14 DIAGNOSIS — E7800 Pure hypercholesterolemia, unspecified: Secondary | ICD-10-CM

## 2012-08-14 DIAGNOSIS — D62 Acute posthemorrhagic anemia: Secondary | ICD-10-CM

## 2012-08-14 DIAGNOSIS — I10 Essential (primary) hypertension: Secondary | ICD-10-CM

## 2012-08-14 DIAGNOSIS — Z79899 Other long term (current) drug therapy: Secondary | ICD-10-CM | POA: Insufficient documentation

## 2012-08-14 DIAGNOSIS — E876 Hypokalemia: Secondary | ICD-10-CM

## 2012-08-14 LAB — POCT CBC
HCT, POC: 34.8 % — AB (ref 43.5–53.7)
Hemoglobin: 11.8 g/dL — AB (ref 14.1–18.1)
MPV: 9.7 fL (ref 0–99.8)
POC Granulocyte: 5.7 (ref 2–6.9)
RBC: 3.8 M/uL — AB (ref 4.69–6.13)

## 2012-08-14 LAB — HEPATIC FUNCTION PANEL
AST: 17 U/L (ref 0–37)
Alkaline Phosphatase: 103 U/L (ref 39–117)
Bilirubin, Direct: 0.1 mg/dL (ref 0.0–0.3)
Indirect Bilirubin: 0.3 mg/dL (ref 0.0–0.9)
Total Bilirubin: 0.4 mg/dL (ref 0.3–1.2)

## 2012-08-14 LAB — BASIC METABOLIC PANEL WITH GFR
BUN: 18 mg/dL (ref 6–23)
CO2: 28 mEq/L (ref 19–32)
Chloride: 106 mEq/L (ref 96–112)
GFR, Est Non African American: 58 mL/min — ABNORMAL LOW
Glucose, Bld: 126 mg/dL — ABNORMAL HIGH (ref 70–99)
Potassium: 3.8 mEq/L (ref 3.5–5.3)

## 2012-08-14 NOTE — Progress Notes (Signed)
Subjective:    Patient ID: Roger Palmer, male    DOB: [birth year], 62 y.o.   MRN: [id number]  HPI A home visit was made to see Roger Palmer today because is confined to a wheelchair following his multiple hip and arm injuries, from a fall. This was done primarily because he is unable to bear weight following the surgery on his right hip. He has a history of right hip, right elbow, and right wrist surgery. Considering his confinement he seems to be doing fairly well with his recovery. He has a history of diabetes, hypertension, hyperlipidemia, and obesity. Blood sugars at home have been running fasting anywhere from 118-124. Blood sugars before dinner have been running in the low 130s.   Review of Systems  Constitutional: Positive for fatigue. Negative for fever and unexpected weight change.  HENT: Negative.  Negative for ear pain, nosebleeds, congestion, sore throat and trouble swallowing.   Eyes: Negative.   Respiratory: Positive for wheezing. Negative for cough, choking, chest tightness and shortness of breath.   Cardiovascular: Negative.  Negative for chest pain and palpitations.  Gastrointestinal: Negative.  Negative for vomiting, abdominal pain, diarrhea, constipation and blood in stool.  Genitourinary: Negative for dysuria, frequency and difficulty urinating.  Musculoskeletal: Positive for myalgias (secondary to multiple surgeries), back pain, joint swelling (rt elbow and rt wrist), arthralgias (rt hip) and gait problem (unable to walk due to hip surgery).       He has pain in the right elbow, right wrist,  right hip and right thigh subsequent to his fall from a tree  Skin: Positive for rash (back, mild).  Neurological: Positive for weakness. Negative for dizziness.  Hematological: Negative for adenopathy.  Psychiatric/Behavioral: Positive for sleep disturbance.       Objective:   Physical Exam  Vitals reviewed. Constitutional: He is oriented to person, place, and time. He appears  well-developed and well-nourished.  HENT:  Head: Normocephalic and atraumatic.  Mouth/Throat: Oropharynx is clear and moist. No oropharyngeal exudate.  Eyes: Conjunctivae and EOM are normal. No scleral icterus.  Neck: Normal range of motion. Neck supple. No tracheal deviation present. No thyromegaly present.  Cardiovascular: Normal rate, regular rhythm, normal heart sounds and intact distal pulses.  Exam reveals no gallop and no friction rub.   No murmur heard. Pulmonary/Chest: Effort normal and breath sounds normal. He has no wheezes. He has no rales.  Abdominal: Soft. He exhibits no distension and no mass. There is no tenderness. There is no rebound and no guarding.  Musculoskeletal: He exhibits edema and tenderness.  Limited range of motion of right upper extremity and right lower extremity  Lymphadenopathy:    He has no cervical adenopathy.  Neurological: He is alert and oriented to person, place, and time. He has normal reflexes.  Skin: Skin is warm and dry. Rash noted. No erythema.  Psychiatric: His behavior is normal. Judgment and thought content normal.  Slightly flat affect          Assessment & Plan:   1. Essential hypertension, benign - BASIC METABOLIC PANEL WITH GFR  2. Diabetes - POCT glycosylated hemoglobin (Hb A1C) - BASIC METABOLIC PANEL WITH GFR - Vitamin D 25 hydroxy  3. Other and unspecified hyperlipidemia - Hepatic function panel - NMR Lipoprofile with Lipids  4. Hypernatremia - BASIC METABOLIC PANEL WITH GFR  5. Acute blood loss anemia - POCT CBC  6. Hypertension  7. Hypercholesteremia  8. BPH (benign prostatic hypertrophy)  9. Diabetes mellitus without complication  10. Hypokalemia  11. High risk medication use

## 2012-08-14 NOTE — Patient Instructions (Signed)
Will do followup visit as needed and followup labs as needed Continue physical therapy at home

## 2012-08-15 MED ORDER — TRAMADOL HCL 50 MG PO TABS: 50.0000 mg | ORAL_TABLET | Freq: Every day | ORAL | Status: DC | PRN

## 2012-08-15 NOTE — Addendum Note (Signed)
Addended by: Ernestina Penna on: 08/15/2012 01:51 PM   Modules accepted: Orders

## 2012-08-17 LAB — NMR LIPOPROFILE WITH LIPIDS
HDL Particle Number: 23.9 umol/L — ABNORMAL LOW (ref >=30.5)
HDL-C: 32 mg/dL — ABNORMAL LOW (ref >=40)
LDL (calc): 72 mg/dL (ref <100)
LDL Size: 21.1 nm (ref >20.5)
LP-IR Score: 44 (ref <=45)
Triglycerides: 114 mg/dL (ref <150)

## 2012-08-18 ENCOUNTER — Ambulatory Visit: Payer: Self-pay | Admitting: Family Medicine

## 2012-08-21 ENCOUNTER — Telehealth: Payer: Self-pay

## 2012-08-21 NOTE — Telephone Encounter (Signed)
[facility] called to get orders to extend care.  Verbal ok given.

## 2012-08-26 ENCOUNTER — Other Ambulatory Visit (INDEPENDENT_AMBULATORY_CARE_PROVIDER_SITE_OTHER): Payer: 59

## 2012-08-26 DIAGNOSIS — N390 Urinary tract infection, site not specified: Secondary | ICD-10-CM

## 2012-08-26 LAB — POCT URINALYSIS DIPSTICK
Glucose, UA: NEGATIVE
Ketones, UA: NEGATIVE
Spec Grav, UA: 1.015
Urobilinogen, UA: NEGATIVE

## 2012-08-26 LAB — POCT UA - MICROSCOPIC ONLY

## 2012-09-08 ENCOUNTER — Encounter: Payer: 59 | Attending: Physical Medicine & Rehabilitation | Admitting: Physical Medicine & Rehabilitation

## 2012-09-08 ENCOUNTER — Encounter: Payer: Self-pay | Admitting: Physical Medicine & Rehabilitation

## 2012-09-08 VITALS — BP 154/82 | HR 110 | Resp 16 | Ht 69.0 in | Wt 225.0 lb

## 2012-09-08 DIAGNOSIS — S06890D Other specified intracranial injury without loss of consciousness, subsequent encounter: Secondary | ICD-10-CM

## 2012-09-08 DIAGNOSIS — S329XXA Fracture of unspecified parts of lumbosacral spine and pelvis, initial encounter for closed fracture: Secondary | ICD-10-CM | POA: Insufficient documentation

## 2012-09-08 DIAGNOSIS — X58XXXA Exposure to other specified factors, initial encounter: Secondary | ICD-10-CM | POA: Insufficient documentation

## 2012-09-08 DIAGNOSIS — S53106A Unspecified dislocation of unspecified ulnohumeral joint, initial encounter: Secondary | ICD-10-CM | POA: Insufficient documentation

## 2012-09-08 DIAGNOSIS — Z5189 Encounter for other specified aftercare: Secondary | ICD-10-CM

## 2012-09-08 DIAGNOSIS — M5412 Radiculopathy, cervical region: Secondary | ICD-10-CM | POA: Insufficient documentation

## 2012-09-08 DIAGNOSIS — S8410XA Injury of peroneal nerve at lower leg level, unspecified leg, initial encounter: Secondary | ICD-10-CM | POA: Insufficient documentation

## 2012-09-08 DIAGNOSIS — Z79899 Other long term (current) drug therapy: Secondary | ICD-10-CM

## 2012-09-08 DIAGNOSIS — S32401G Unspecified fracture of right acetabulum, subsequent encounter for fracture with delayed healing: Secondary | ICD-10-CM

## 2012-09-08 DIAGNOSIS — S8411XA Injury of peroneal nerve at lower leg level, right leg, initial encounter: Secondary | ICD-10-CM | POA: Insufficient documentation

## 2012-09-08 DIAGNOSIS — S8411XD Injury of peroneal nerve at lower leg level, right leg, subsequent encounter: Secondary | ICD-10-CM

## 2012-09-08 DIAGNOSIS — S52539A Colles' fracture of unspecified radius, initial encounter for closed fracture: Secondary | ICD-10-CM | POA: Insufficient documentation

## 2012-09-08 DIAGNOSIS — S72009D Fracture of unspecified part of neck of unspecified femur, subsequent encounter for closed fracture with routine healing: Secondary | ICD-10-CM

## 2012-09-08 MED ORDER — PREGABALIN 75 MG PO CAPS: 75.0000 mg | ORAL_CAPSULE | Freq: Three times a day (TID) | ORAL | Status: DC

## 2012-09-08 NOTE — Patient Instructions (Signed)
CALL ME WITH ANY PROBLEMS OR QUESTIONS (#[phone]).  HAVE A GOOD DAY  

## 2012-09-08 NOTE — Progress Notes (Signed)
Subjective:    Patient ID: Roger Palmer, male    DOB: May 31, 1950, 62 y.o.   MRN: [id number]  HPI  Mr. Chalfin is back regarding his polytrauma. He saw Dr. Carola Frost who cleared him for 25% wb for the righ leg. Weingold cleared him for ROM as tolerated with therapy.   Most of his pain is related to nerve pain in his right foot/leg. He has been sleeping fairly well despite the pain, but his sleep can be broken. He is taking lyrica 75mg  bid which has helped. He is off coumadin.   He is on abx for drainage from his traction site. The site has been slow to heal.   Wife reports that he is still is "slow", longer than he used to be. His processing is delayed per his wife. The patient denies any change as he's always been slow.   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 He says that he can't do just "desk work."    Pain Inventory Average Pain 4 Pain Right Now 3 My pain is burning and tingling  In the last 24 hours, has pain interfered with the following? General activity 2 Relation with others 1 Enjoyment of life 1 What TIME of day is your pain at its worst? night Sleep (in general) Fair  Pain is worse with: other Pain improves with: medication Relief from Meds: 6  Mobility ability to climb steps?  no do you drive?  no use a wheelchair transfers alone  Function employed # of hrs/week 40 hours I need assistance with the following:  meal prep and household duties  Neuro/Psych numbness trouble walking  Prior Studies Any changes since last visit?  no  Physicians involved in your care Any changes since last visit?  no   Family History  Problem Relation Age of Onset  . Heart attack Mother   . Diabetes Mother     NIDDM  . Hypertension Mother   . Hypertension Father   . Stroke Father   . Nephrolithiasis Brother    History   Social History  . Marital Status: Single    Spouse Name: N/A    Number of Children:  2  . Years of Education: N/A   Social History Main Topics  . Smoking status: Never Smoker   . Smokeless tobacco: None  . Alcohol Use: Yes     Comment: social  . Drug Use: No  . Sexually Active: Yes   Other Topics Concern  . None   Social History Narrative   Works for [employer]   Past Surgical History  Procedure Laterality Date  . Inguinal hernia repair      right  . Kidney stones      x2 Dr. Earlene Plater   . Orif r elbow  07/12/2012  . Orif r wirst  07/12/2012  . Open reduction internal fixation (orif) distal radial fracture Right 07/11/2012    Procedure: OPEN REDUCTION INTERNAL FIXATION (ORIF) DISTAL RADIAL FRACTURE;  Surgeon: Marlowe Shores, MD;  Location: [facility];  Service: Orthopedics;  Laterality: Right;  . Orif radial fracture Right 07/11/2012    Procedure: OPEN REDUCTION INTERNAL FIXATION (ORIF) RADIAL head FRACTURE;  Surgeon: Marlowe Shores, MD;  Location: [facility];  Service: Orthopedics;  Laterality: Right;  . Orif acetabular fracture Right 07/14/2012    Procedure: OPEN REDUCTION INTERNAL FIXATION (ORIF) ACETABULAR FRACTURE;  Surgeon: Budd Palmer, MD;  Location: [facility];  Service: Orthopedics;  Laterality: Right;  . Fracture surgery     Past Medical History  Diagnosis Date  . Bee sting allergy   . Hypercholesteremia   . Nephrolithiasis   . Herniated disc     c7-c7, c5-6, c3-4, c4-5  . Hypertension   . Back pain   . Obesity   . BPH (benign prostatic hypertrophy)   . Gastritis   . Reflux   . Diabetes mellitus without complication    BP 154/82  Pulse 110  Resp 16  Ht 5\' 9"  (1.753 m)  Wt 225 lb (102.059 kg)  BMI 33.21 kg/m2  SpO2 98%     Review of Systems  Musculoskeletal: Positive for gait problem.  Neurological: Positive for numbness.  All other systems reviewed and are negative.       Objective:   Physical Exam  General: Alert and oriented x 3, No apparent distress HEENT: Head is normocephalic, atraumatic, PERRLA, EOMI, sclera anicteric, oral  mucosa pink and moist, dentition intact, ext ear canals clear,  Neck: Supple without JVD or lymphadenopathy Heart: Reg rate and rhythm. No murmurs rubs or gallops Chest: CTA bilaterally without wheezes, rales, or rhonchi; no distress Abdomen: Soft, non-tender, non-distended, bowel sounds positive. Extremities: No clubbing, cyanosis, or edema. Pulses are 2+ Skin: Clean and intact without signs of breakdown except for small space over lateral thigh from prior pin site.  Neuro: Pt is cognitively appropriate with normal insight, memory, and awareness. Cranial nerves 2-12 are intact. Sensory exam is diminished at web space of right 1st and 2nd toes and somewhat over the first three of the right hadn.  Reflexes are 1 to 2+ in all 4's. Fine motor coordination is fair., . No tremors. Motor function is grossly 5/5 except with ADF which is 3, EV which is 3+. And HI and WF which are 3+. Musculoskeletal: Full ROM, No pain with AROM or PROM in the neck, trunk, or extremities. Posture appropriate Psych: Pt's affect is appropriate. Pt is cooperative         Assessment & Plan:  1. Functional deficits secondary to complex right hemipelvis fx, right elbow dislocation with distal radius fx, mild TBI  2. Right peroneal nerve injury 3. 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 Increase lyrica to 75mg  TID 2. Continue with therapy to increase ROM and mobility.  3. Continue tramadol for pain and before activity 4. Will follow up with me in about 2 months. He will discuss with his employer about job modifications. If he needs to return at full duty, it will be a few months. 30 minutes of face to face patient care time were spent during this visit. All questions were encouraged and answered.

## 2012-09-21 ENCOUNTER — Ambulatory Visit: Payer: 59 | Admitting: Physical Therapy

## 2012-11-06 ENCOUNTER — Encounter: Payer: 59 | Attending: Physical Medicine & Rehabilitation | Admitting: Physical Medicine & Rehabilitation

## 2012-11-06 ENCOUNTER — Encounter: Payer: Self-pay | Admitting: Physical Medicine & Rehabilitation

## 2012-11-06 DIAGNOSIS — S32401S Unspecified fracture of right acetabulum, sequela: Secondary | ICD-10-CM

## 2012-11-06 DIAGNOSIS — S8411XD Injury of peroneal nerve at lower leg level, right leg, subsequent encounter: Secondary | ICD-10-CM

## 2012-11-06 DIAGNOSIS — X58XXXA Exposure to other specified factors, initial encounter: Secondary | ICD-10-CM | POA: Insufficient documentation

## 2012-11-06 DIAGNOSIS — IMO0002 Reserved for concepts with insufficient information to code with codable children: Secondary | ICD-10-CM

## 2012-11-06 DIAGNOSIS — S329XXA Fracture of unspecified parts of lumbosacral spine and pelvis, initial encounter for closed fracture: Secondary | ICD-10-CM | POA: Insufficient documentation

## 2012-11-06 DIAGNOSIS — S51001D Unspecified open wound of right elbow, subsequent encounter: Secondary | ICD-10-CM

## 2012-11-06 DIAGNOSIS — S8410XA Injury of peroneal nerve at lower leg level, unspecified leg, initial encounter: Secondary | ICD-10-CM | POA: Insufficient documentation

## 2012-11-06 DIAGNOSIS — M5412 Radiculopathy, cervical region: Secondary | ICD-10-CM | POA: Insufficient documentation

## 2012-11-06 DIAGNOSIS — S52539A Colles' fracture of unspecified radius, initial encounter for closed fracture: Secondary | ICD-10-CM | POA: Insufficient documentation

## 2012-11-06 DIAGNOSIS — S53106A Unspecified dislocation of unspecified ulnohumeral joint, initial encounter: Secondary | ICD-10-CM | POA: Insufficient documentation

## 2012-11-06 DIAGNOSIS — Z5189 Encounter for other specified aftercare: Secondary | ICD-10-CM

## 2012-11-06 NOTE — Progress Notes (Signed)
Subjective:    Patient ID: Roger Palmer, male    DOB: 1950-11-28, 62 y.o.   MRN: [id number]  HPI  Roger Palmer is back regarding his polytrauma. His right foot is still weak but getting stronger. The pain has improved quite a bit there. It has improved enough that he came off the lyrica. He is able to walk without his cane. He cut his grass yesterday. Stamina remains an issue. He has received a JAS splint to help with his elbow rom.   His employer is not pressuring him much to return to work and seems willing to help in with accommodations initially.    Pain Inventory Average Pain 3 Pain Right Now 2 My pain is dull  In the last 24 hours, has pain interfered with the following? General activity 2 Relation with others 1 Enjoyment of life 2 What TIME of day is your pain at its worst? evening Sleep (in general) Fair  Pain is worse with: walking and standing Pain improves with: rest Relief from Meds: na  Mobility walk without assistance use a cane how many minutes can you walk? 20 ability to climb steps?  yes do you drive?  yes  Function I need assistance with the following:  meal prep and household duties  Neuro/Psych tingling trouble walking  Prior Studies Any changes since last visit?  no  Physicians involved in your care Any changes since last visit?  no   Family History  Problem Relation Age of Onset  . Heart attack Mother   . Diabetes Mother     NIDDM  . Hypertension Mother   . Hypertension Father   . Stroke Father   . Nephrolithiasis Brother    History   Social History  . Marital Status: Single    Spouse Name: N/A    Number of Children: 2  . Years of Education: N/A   Social History Main Topics  . Smoking status: Never Smoker   . Smokeless tobacco: None  . Alcohol Use: Yes     Comment: social  . Drug Use: No  . Sexually Active: Yes   Other Topics Concern  . None   Social History Narrative   Works for [employer]   Past Surgical  History  Procedure Laterality Date  . Inguinal hernia repair      right  . Kidney stones      x2 Dr. Earlene Plater   . Orif r elbow  07/12/2012  . Orif r wirst  07/12/2012  . Open reduction internal fixation (orif) distal radial fracture Right 07/11/2012    Procedure: OPEN REDUCTION INTERNAL FIXATION (ORIF) DISTAL RADIAL FRACTURE;  Surgeon: Marlowe Shores, MD;  Location: [facility];  Service: Orthopedics;  Laterality: Right;  . Orif radial fracture Right 07/11/2012    Procedure: OPEN REDUCTION INTERNAL FIXATION (ORIF) RADIAL head FRACTURE;  Surgeon: Marlowe Shores, MD;  Location: [facility];  Service: Orthopedics;  Laterality: Right;  . Orif acetabular fracture Right 07/14/2012    Procedure: OPEN REDUCTION INTERNAL FIXATION (ORIF) ACETABULAR FRACTURE;  Surgeon: Budd Palmer, MD;  Location: [facility];  Service: Orthopedics;  Laterality: Right;  . Fracture surgery     Past Medical History  Diagnosis Date  . Bee sting allergy   . Hypercholesteremia   . Nephrolithiasis   . Herniated disc     c7-c7, c5-6, c3-4, c4-5  . Hypertension   . Back pain   . Obesity   . BPH (benign prostatic hypertrophy)   .  Gastritis   . Reflux   . Diabetes mellitus without complication    There were no vitals taken for this visit.     Review of Systems  Musculoskeletal: Positive for gait problem.  Neurological:       Tingling  All other systems reviewed and are negative.       Objective:   Physical Exam General: Alert and oriented x 3, No apparent distress  HEENT: Head is normocephalic, atraumatic, PERRLA, EOMI, sclera anicteric, oral mucosa pink and moist, dentition intact, ext ear canals clear,  Neck: Supple without JVD or lymphadenopathy  Heart: Reg rate and rhythm. No murmurs rubs or gallops  Chest: CTA bilaterally without wheezes, rales, or rhonchi; no distress  Abdomen: Soft, non-tender, non-distended, bowel sounds positive.  Extremities: No clubbing, cyanosis, or edema. Pulses are 2+  Skin: Clean and  intact without signs of breakdown except for small space over lateral thigh from prior pin site.  Neuro: Pt is cognitively appropriate with normal insight, memory, and awareness. Cranial nerves 2-12 are intact. Sensory exam is diminished at web space of right 1st and 2nd toes and somewhat over the first three of the right hadn. Reflexes are 1 to 2+ in all 4's. Fine motor coordination is fair., . No tremors. Motor function is grossly 5/5 except with ADF which is 4, EV which is 4. And HI and WF which are 4 to 4+. His gait is stable. He has good right foot clearance and minimal substitution patterns.  Musculoskeletal: He can flex elbow to 105 degrees and can extend to 135 degrees with assistance.  Psych: Pt's affect is appropriate. Pt is cooperative   Assessment & Plan:   1. Functional deficits secondary to complex right hemipelvis fx, right elbow dislocation with distal radius fx, mild TBI  2. Right peroneal nerve injury  3. Old right cervical injury with radiculopathy--stable 4. 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 Lyrica stopped. No need to continue 2. Continue with JAS splint for right elbow ROM. The more he works on this the quicker it will recover. However, he won't likely recover full ROM in the elbow. 3. Continue tramadol for pain and before activity if needed. Utilize heat as well for right shoulder. 4. Will see him back PRN. 15 minutes of face to face patient care time were spent during this visit. All questions were encouraged and answered. Surgery will direct return to work.

## 2012-11-06 NOTE — Patient Instructions (Signed)
BE AGGRESSIVE WITH YOUR SHOULDER RANGE OF MOTION EXERCISES!!

## 2012-11-11 ENCOUNTER — Ambulatory Visit (INDEPENDENT_AMBULATORY_CARE_PROVIDER_SITE_OTHER): Payer: 59 | Admitting: Family Medicine

## 2012-11-11 ENCOUNTER — Encounter: Payer: Self-pay | Admitting: Family Medicine

## 2012-11-11 VITALS — BP 153/85 | HR 79 | Temp 97.0°F | Wt 208.0 lb

## 2012-11-11 DIAGNOSIS — K219 Gastro-esophageal reflux disease without esophagitis: Secondary | ICD-10-CM

## 2012-11-11 DIAGNOSIS — N390 Urinary tract infection, site not specified: Secondary | ICD-10-CM

## 2012-11-11 DIAGNOSIS — I10 Essential (primary) hypertension: Secondary | ICD-10-CM

## 2012-11-11 DIAGNOSIS — E876 Hypokalemia: Secondary | ICD-10-CM

## 2012-11-11 DIAGNOSIS — R5383 Other fatigue: Secondary | ICD-10-CM

## 2012-11-11 DIAGNOSIS — E119 Type 2 diabetes mellitus without complications: Secondary | ICD-10-CM

## 2012-11-11 DIAGNOSIS — R351 Nocturia: Secondary | ICD-10-CM

## 2012-11-11 DIAGNOSIS — IMO0001 Reserved for inherently not codable concepts without codable children: Secondary | ICD-10-CM

## 2012-11-11 DIAGNOSIS — R5381 Other malaise: Secondary | ICD-10-CM

## 2012-11-11 DIAGNOSIS — N200 Calculus of kidney: Secondary | ICD-10-CM

## 2012-11-11 DIAGNOSIS — E785 Hyperlipidemia, unspecified: Secondary | ICD-10-CM

## 2012-11-11 DIAGNOSIS — N400 Enlarged prostate without lower urinary tract symptoms: Secondary | ICD-10-CM

## 2012-11-11 LAB — POCT URINALYSIS DIPSTICK
Bilirubin, UA: NEGATIVE
Glucose, UA: NEGATIVE
Ketones, UA: NEGATIVE
Leukocytes, UA: NEGATIVE
Nitrite, UA: NEGATIVE

## 2012-11-11 LAB — POCT CBC
Hemoglobin: 13.9 g/dL — AB (ref 14.1–18.1)
Lymph, poc: 1.6 (ref 0.6–3.4)
MCH, POC: 31.7 pg — AB (ref 27–31.2)
MCHC: 35.9 g/dL — AB (ref 31.8–35.4)
MPV: 8.5 fL (ref 0–99.8)
WBC: 6.5 10*3/uL (ref 4.6–10.2)

## 2012-11-11 LAB — THYROID PANEL WITH TSH: Free Thyroxine Index: 3.5 (ref 1.0–3.9)

## 2012-11-11 LAB — HEPATIC FUNCTION PANEL
ALT: 14 U/L (ref 0–53)
AST: 18 U/L (ref 0–37)
Alkaline Phosphatase: 59 U/L (ref 39–117)
Bilirubin, Direct: 0.1 mg/dL (ref 0.0–0.3)
Total Bilirubin: 0.5 mg/dL (ref 0.3–1.2)

## 2012-11-11 LAB — POCT UA - MICROSCOPIC ONLY
Bacteria, U Microscopic: NEGATIVE
Casts, Ur, LPF, POC: NEGATIVE
Crystals, Ur, HPF, POC: NEGATIVE
Mucus, UA: NEGATIVE
RBC, urine, microscopic: NEGATIVE

## 2012-11-11 NOTE — Progress Notes (Signed)
Subjective:    Patient ID: Roger Palmer, male    DOB: Nov 10, 1950, 62 y.o.   MRN: [id number]  HPI Patient returns to clinic today for followup of chronic medical problems which include hypertension, hyperlipidemia, and diabetes mellitus type 2. The patient's condition has improved since he sustained a fall from a tree back several months ago and had multiple fractures to the arm and hip. He is still doing physical therapy and is currently ambulating with a cane. Patient has a positive attitude and indicates that he is progressing well with physical therapy. He is especially sore when he gets up in the morning and fatigues by late afternoon. Patient notes that he is not taking metformin. He brings blood sugars in with him today. Home blood sugars have been running fasting in the 1:30 range.  Review of Systems  Constitutional: Negative.   HENT: Negative.   Eyes: Negative.   Respiratory: Negative.   Cardiovascular: Negative.   Gastrointestinal: Positive for blood in stool (hx hemorrhoids).  Endocrine: Negative.   Genitourinary: Positive for enuresis.       States gets up every 3 hrs to urinate  Allergic/Immunologic: Negative.   Neurological: Negative.   Hematological: Negative.   Psychiatric/Behavioral: Negative.        Objective:   Physical Exam Repeat blood pressure 134/88 in left arm sitting  BP 153/85  Pulse 79  Temp(Src) 97 F (36.1 C) (Oral)  Wt 208 lb (94.348 kg)  BMI 30.7 kg/m2  The patient appeared well nourished and normally developed, alert and oriented to time and place. Speech, behavior and judgement appear normal. Vital signs as documented.  Head exam is unremarkable. No scleral icterus or pallor noted.  Neck is without jugular venous distension, thyromegally, or carotid bruits. Carotid upstrokes are brisk bilaterally. No cervical adenopathy. Lungs are clear anteriorly and posteriorly to auscultation. Normal respiratory effort. Cardiac exam reveals regular rate and  rhythm at 72 per minute. First and second heart sounds normal.  No murmurs, rubs or gallops.  Abdominal exam reveals normal bowl sounds, no masses, no organomegaly and no aortic enlargement. No inguinal adenopathy. Extremities are nonedematous and both femoral and pedal pulses are normal. He has limited range of motion of the right arm and is unable to fully extend his forearm Skin without pallor or jaundice.  Warm and dry, without rash. Neurologic exam reveals normal deep tendon reflexes and normal sensation.         Assessment & Plan:  1. Other and unspecified hyperlipidemia - POCT CBC - POCT glycosylated hemoglobin (Hb A1C); Future - BASIC METABOLIC PANEL WITH GFR; Future - NMR Lipoprofile with Lipids; Future - Hepatic function panel; Future  2. Other malaise and fatigue - POCT CBC - POCT glycosylated hemoglobin (Hb A1C); Future - BASIC METABOLIC PANEL WITH GFR; Future - NMR Lipoprofile with Lipids; Future - Hepatic function panel; Future - Thyroid Panel With TSH  3. Urinary tract infection, site not specified - POCT urinalysis dipstick; Future - POCT UA - Microscopic Only; Future  4. Hypertension  5. Hypokalemia   6. Nephrolithiasis   7. Diabetes mellitus without complication  8. BPH (benign prostatic hypertrophy) - PSA  9. Reflux  10. Nocturia   Patient Instructions  For now continue current meds and keep working on improving therapeutic lifestyle changes which include diet and exercise. Elevate feet when resting during the day higher than the level of the heart, and especially in the evening before going to bed. Limit fluid intake after dinner. Continue  to monitor blood sugar and possible blood pressures at home Always remember to watch sodium intake because this causes more fluid retention   We will arrange an appointment with the urologist Dr. Annabell Howells for the urinary frequency

## 2012-11-11 NOTE — Addendum Note (Signed)
Addended by: Pura Spice on: 11/11/2012 08:37 AM   Modules accepted: Orders

## 2012-11-11 NOTE — Progress Notes (Signed)
Patient ID: Roger Palmer, male   DOB: Apr 07, 1951, 62 y.o.   MRN: [id number]

## 2012-11-11 NOTE — Patient Instructions (Addendum)
For now continue current meds and keep working on improving therapeutic lifestyle changes which include diet and exercise. Elevate feet when resting during the day higher than the level of the heart, and especially in the evening before going to bed. Limit fluid intake after dinner. Continue to monitor blood sugar and possible blood pressures at home Always remember to watch sodium intake because this causes more fluid retention

## 2012-11-12 LAB — NMR LIPOPROFILE WITH LIPIDS
HDL Particle Number: 34.5 umol/L (ref >=30.5)
HDL Size: 9.4 nm (ref >=9.2)
HDL-C: 52 mg/dL (ref >=40)
LDL Size: 20.9 nm (ref >20.5)
Large HDL-P: 5.4 umol/L (ref >=4.8)
Large VLDL-P: 2.3 nmol/L (ref <=2.7)

## 2012-11-12 LAB — BASIC METABOLIC PANEL WITH GFR
BUN: 13 mg/dL (ref 6–23)
Creat: 1.25 mg/dL (ref 0.50–1.35)
GFR, Est African American: 70 mL/min
GFR, Est Non African American: 61 mL/min
Glucose, Bld: 121 mg/dL — ABNORMAL HIGH (ref 70–99)
Potassium: 3.7 mEq/L (ref 3.5–5.3)

## 2012-11-25 ENCOUNTER — Telehealth: Payer: Self-pay | Admitting: *Deleted

## 2012-11-25 NOTE — Telephone Encounter (Signed)
Message copied by Bearl Mulberry on Wed Nov 25, 2012  7:17 PM ------      Message from: Ernestina Penna      Created: Wed Nov 11, 2012  1:19 PM       The hemoglobin A1c was 6.5%, this is good without taking any medication. We will continue to monitor this on future visits. Continue aggressive therapeutic lifestyle changes.      The urinalysis was clear. There is no sign of any infection.      The CBC had a normal white blood cell count. And a hemoglobin that was almost normal at 13.9. Normal hemoglobin is 14.1-18. Platelet count was adequate. ------

## 2012-11-25 NOTE — Telephone Encounter (Signed)
Pt's wife notified of results. 

## 2012-12-25 ENCOUNTER — Telehealth: Payer: Self-pay | Admitting: *Deleted

## 2012-12-25 MED ORDER — ATORVASTATIN CALCIUM 40 MG PO TABS: 40.0000 mg | ORAL_TABLET | Freq: Every day | ORAL | Status: DC

## 2012-12-25 MED ORDER — FENOFIBRATE MICRONIZED 134 MG PO CAPS: 134.0000 mg | ORAL_CAPSULE | Freq: Every day | ORAL | Status: DC

## 2012-12-25 MED ORDER — TAMSULOSIN HCL 0.4 MG PO CAPS: 0.4000 mg | ORAL_CAPSULE | Freq: Every day | ORAL | Status: DC

## 2012-12-25 MED ORDER — DUTASTERIDE 0.5 MG PO CAPS: 0.5000 mg | ORAL_CAPSULE | Freq: Every day | ORAL | Status: DC

## 2012-12-25 MED ORDER — OMEPRAZOLE 20 MG PO CPDR: 20.0000 mg | DELAYED_RELEASE_CAPSULE | Freq: Every day | ORAL | Status: DC

## 2012-12-25 NOTE — Telephone Encounter (Signed)
done

## 2012-12-28 ENCOUNTER — Telehealth: Payer: Self-pay | Admitting: Family Medicine

## 2012-12-29 NOTE — Telephone Encounter (Signed)
PT NOTIFIED RX SENT ELECTRONICALLY.

## 2013-01-05 ENCOUNTER — Other Ambulatory Visit: Payer: Self-pay

## 2013-01-05 MED ORDER — TAMSULOSIN HCL 0.4 MG PO CAPS: 0.4000 mg | ORAL_CAPSULE | Freq: Every day | ORAL | Status: DC

## 2013-01-05 MED ORDER — OMEPRAZOLE 20 MG PO CPDR: 20.0000 mg | DELAYED_RELEASE_CAPSULE | Freq: Every day | ORAL | Status: DC

## 2013-01-05 MED ORDER — ATORVASTATIN CALCIUM 40 MG PO TABS: 40.0000 mg | ORAL_TABLET | Freq: Every day | ORAL | Status: DC

## 2013-01-05 MED ORDER — DUTASTERIDE 0.5 MG PO CAPS: 0.5000 mg | ORAL_CAPSULE | Freq: Every day | ORAL | Status: DC

## 2013-01-05 MED ORDER — FENOFIBRATE MICRONIZED 134 MG PO CAPS: 134.0000 mg | ORAL_CAPSULE | Freq: Every day | ORAL | Status: DC

## 2013-01-05 NOTE — Telephone Encounter (Signed)
x

## 2013-03-11 ENCOUNTER — Ambulatory Visit (INDEPENDENT_AMBULATORY_CARE_PROVIDER_SITE_OTHER): Payer: 59 | Admitting: Family Medicine

## 2013-03-11 ENCOUNTER — Encounter: Payer: Self-pay | Admitting: Family Medicine

## 2013-03-11 VITALS — BP 158/87 | HR 86 | Temp 96.9°F | Ht 69.0 in | Wt 214.0 lb

## 2013-03-11 DIAGNOSIS — N400 Enlarged prostate without lower urinary tract symptoms: Secondary | ICD-10-CM

## 2013-03-11 DIAGNOSIS — E7800 Pure hypercholesterolemia, unspecified: Secondary | ICD-10-CM

## 2013-03-11 DIAGNOSIS — E119 Type 2 diabetes mellitus without complications: Secondary | ICD-10-CM

## 2013-03-11 DIAGNOSIS — I10 Essential (primary) hypertension: Secondary | ICD-10-CM

## 2013-03-11 DIAGNOSIS — E559 Vitamin D deficiency, unspecified: Secondary | ICD-10-CM

## 2013-03-11 DIAGNOSIS — Z23 Encounter for immunization: Secondary | ICD-10-CM

## 2013-03-11 LAB — POCT CBC
Granulocyte percent: 68.2 %G (ref 37–80)
HCT, POC: 44.8 % (ref 43.5–53.7)
Lymph, poc: 2 (ref 0.6–3.4)
MCHC: 32.7 g/dL (ref 31.8–35.4)
MCV: 92.1 fL (ref 80–97)
POC Granulocyte: 5 (ref 2–6.9)
Platelet Count, POC: 196 10*3/uL (ref 142–424)
RDW, POC: 13.1 %
WBC: 7.3 10*3/uL (ref 4.6–10.2)

## 2013-03-11 LAB — POCT GLYCOSYLATED HEMOGLOBIN (HGB A1C): Hemoglobin A1C: 6.1

## 2013-03-11 NOTE — Progress Notes (Signed)
Subjective:    Patient ID: Roger Palmer, male    DOB: Dec 22, 1950, 62 y.o.   MRN: [id number]  HPI Pt here for follow up and management of chronic medical problems. Patient is doing well without minimal complaints. He is continuing to recover from the severe accident resulting from falling off a ladder. He still has blood pressure medicine at home.     Patient Active Problem List   Diagnosis Date Noted  . 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 High risk medication use 08/14/2012  . 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 Ileus 07/23/2012  . Acute kidney injury 07/20/2012  . Hypernatremia 07/20/2012  . Aspiration pneumonia 07/20/2012  . Acute respiratory failure following trauma and surgery 07/16/2012  . Acute blood loss anemia 07/14/2012  . Hypokalemia 07/14/2012  . Fall from tree 07/13/2012  . Hypercholesteremia   . Nephrolithiasis   . Hypertension   . Back pain   . Obesity   . BPH (benign prostatic hypertrophy)   . Gastritis   . Reflux   . Diabetes mellitus without complication   . Right acetabular fracture 07/11/2012  . Open dislocation of right elbow 07/11/2012  . Distal radius fracture, right 07/11/2012  . Fracture of iliac wing 07/11/2012  . Pubic ramus fracture 07/11/2012   Outpatient Encounter Prescriptions as of 03/11/2013  Medication Sig Dispense Refill  . atorvastatin (LIPITOR) 40 MG tablet Take 1 tablet (40 mg total) by mouth daily.  90 tablet  1  . dutasteride (AVODART) 0.5 MG capsule Take 1 capsule (0.5 mg total) by mouth daily.  90 capsule  1  . fenofibrate micronized (LOFIBRA) 134 MG capsule Take 1 capsule (134 mg total) by mouth daily before breakfast.  90 capsule  1  . omeprazole (PRILOSEC) 20 MG capsule Take 1 capsule (20 mg total) by mouth daily.  90 capsule  1  . tamsulosin (FLOMAX) 0.4 MG CAPS capsule Take 1 capsule (0.4 mg total) by mouth daily.  90 capsule  1  . [DISCONTINUED] pregabalin (LYRICA) 75 MG capsule  Take 1 capsule (75 mg total) by mouth 3 (three) times daily.  90 capsule  4  . [DISCONTINUED] methocarbamol (ROBAXIN) 500 MG tablet       . [DISCONTINUED] protein supplement (RESOURCE BENEPROTEIN) POWD Take 6 g by mouth 3 (three) times daily with meals.      . [DISCONTINUED] traMADol (ULTRAM) 50 MG tablet Take 1 tablet (50 mg total) by mouth 5 (five) times daily as needed for pain.  150 tablet  0   No facility-administered encounter medications on file as of 03/11/2013.    Review of Systems  Constitutional: Negative.   HENT: Negative.   Eyes: Negative.   Respiratory: Negative.   Cardiovascular: Negative.   Gastrointestinal: Negative.   Endocrine: Negative.   Genitourinary: Negative.   Musculoskeletal: Negative.   Skin: Negative.   Allergic/Immunologic: Negative.   Neurological: Negative.   Hematological: Negative.   Psychiatric/Behavioral: Negative.        Objective:   Physical Exam  Nursing note and vitals reviewed. Constitutional: He is oriented to person, place, and time. He appears well-developed and well-nourished.  HENT:  Head: Normocephalic and atraumatic.  Right Ear: External ear normal.  Left Ear: External ear normal.  Nose: Nose normal.  Mouth/Throat: Oropharynx is clear and moist. No oropharyngeal exudate.  Eyes: Conjunctivae and EOM are normal. Pupils are equal, round, and reactive to light. Right eye exhibits no discharge. Left eye exhibits  no discharge. No scleral icterus.  Neck: Normal range of motion. Neck supple. No thyromegaly present.  Cardiovascular: Normal rate, regular rhythm, normal heart sounds and intact distal pulses.  Exam reveals no gallop and no friction rub.   No murmur heard. At 84 per minute  Pulmonary/Chest: Effort normal and breath sounds normal. No respiratory distress. He has no wheezes. He has no rales. He exhibits no tenderness.  Abdominal: Soft. Bowel sounds are normal. He exhibits no mass. There is no tenderness. There is no rebound  and no guarding.  Musculoskeletal: Normal range of motion. He exhibits no edema and no tenderness.  Good range of motion of the hips and knees. Unable to fully extend right arm because of injury.  Lymphadenopathy:    He has no cervical adenopathy.  Neurological: He is alert and oriented to person, place, and time. He has normal reflexes. No cranial nerve deficit.  Skin: Skin is warm and dry. No rash noted. No erythema. No pallor.  Psychiatric: He has a normal mood and affect. His behavior is normal. Judgment and thought content normal.   BP 158/87  Pulse 86  Temp(Src) 96.9 F (36.1 C) (Oral)  Ht 5\' 9"  (1.753 m)  Wt 214 lb (97.07 kg)  BMI 31.59 kg/m2        Assessment & Plan:   1. Hypertension   2. BPH (benign prostatic hypertrophy)   3. Diabetes mellitus without complication   4. Hypercholesteremia   5. Vitamin D deficiency   6. Need for prophylactic vaccination and inoculation against influenza    Orders Placed This Encounter  Procedures  . BMP8+EGFR  . Hepatic function panel  . NMR, lipoprofile  . Vit D  25 hydroxy (rtn osteoporosis monitoring)  . POCT CBC  . POCT glycosylated hemoglobin (Hb A1C)   No orders of the defined types were placed in this encounter.   Patient will restart his Diovan that he has at home which is 160/25 one halfdaily  Patient Instructions  Continue current medications. Continue good therapeutic lifestyle changes.  Fall precautions discussed with patient. Follow up as planned and earlier as needed.  Check with insurance regarding Prevnar injection Use Bactroban ointment on the lesion of left arm Monitor blood pressures closely and come by the office in 4 weeks for a blood pressure check by the nurse and a BM P. Restart Diovan 160/25 one half by mouth daily   Nyra Capes MD

## 2013-03-11 NOTE — Addendum Note (Signed)
Addended by: Magdalene River on: 03/11/2013 10:03 AM   Modules accepted: Orders

## 2013-03-11 NOTE — Patient Instructions (Addendum)
Continue current medications. Continue good therapeutic lifestyle changes.  Fall precautions discussed with patient. Follow up as planned and earlier as needed.  Check with insurance regarding Prevnar injection Use Bactroban ointment on the lesion of left arm Monitor blood pressures closely and come by the office in 4 weeks for a blood pressure check by the nurse and a BM P. Restart Diovan 160/25 one half by mouth daily

## 2013-03-12 LAB — BMP8+EGFR
Chloride: 105 mmol/L (ref 97–108)
GFR calc Af Amer: 77 mL/min/{1.73_m2} (ref >59)
GFR calc non Af Amer: 67 mL/min/{1.73_m2} (ref >59)
Glucose: 131 mg/dL — ABNORMAL HIGH (ref 65–99)
Potassium: 4.3 mmol/L (ref 3.5–5.2)
Sodium: 144 mmol/L (ref 134–144)

## 2013-03-12 LAB — NMR, LIPOPROFILE
Cholesterol: 143 mg/dL (ref <200)
LDL Particle Number: 1197 nmol/L — ABNORMAL HIGH (ref <1000)
LDL Size: 20.3 nm — ABNORMAL LOW (ref >20.5)
LDLC SERPL CALC-MCNC: 74 mg/dL (ref <100)
LP-IR Score: 50 — ABNORMAL HIGH (ref <=45)

## 2013-03-12 LAB — VITAMIN D 25 HYDROXY (VIT D DEFICIENCY, FRACTURES): Vit D, 25-Hydroxy: 27.2 ng/mL — ABNORMAL LOW (ref 30.0–100.0)

## 2013-03-12 LAB — HEPATIC FUNCTION PANEL
ALT: 16 IU/L (ref 0–44)
Alkaline Phosphatase: 70 IU/L (ref 39–117)
Bilirubin, Direct: 0.13 mg/dL (ref 0.00–0.40)
Total Protein: 7.2 g/dL (ref 6.0–8.5)

## 2013-04-01 ENCOUNTER — Ambulatory Visit (INDEPENDENT_AMBULATORY_CARE_PROVIDER_SITE_OTHER): Payer: 59 | Admitting: *Deleted

## 2013-04-01 DIAGNOSIS — Z23 Encounter for immunization: Secondary | ICD-10-CM

## 2013-04-01 NOTE — Patient Instructions (Signed)

## 2013-04-01 NOTE — Progress Notes (Signed)
Patient tolerated well.

## 2013-04-05 ENCOUNTER — Other Ambulatory Visit: Payer: 59

## 2013-04-07 LAB — FECAL OCCULT BLOOD, IMMUNOCHEMICAL: Fecal Occult Bld: NEGATIVE

## 2013-04-09 ENCOUNTER — Other Ambulatory Visit: Payer: 59

## 2013-04-19 ENCOUNTER — Telehealth: Payer: Self-pay | Admitting: *Deleted

## 2013-04-19 NOTE — Telephone Encounter (Signed)
Pt's wife notified of results Verbalizes understanding 

## 2013-05-27 ENCOUNTER — Other Ambulatory Visit: Payer: Self-pay | Admitting: Family Medicine

## 2013-05-27 DIAGNOSIS — E785 Hyperlipidemia, unspecified: Secondary | ICD-10-CM

## 2013-05-27 DIAGNOSIS — K219 Gastro-esophageal reflux disease without esophagitis: Secondary | ICD-10-CM

## 2013-05-27 DIAGNOSIS — N400 Enlarged prostate without lower urinary tract symptoms: Secondary | ICD-10-CM

## 2013-05-28 NOTE — Telephone Encounter (Signed)
Patient last seen in Oct and was to return 4 weeks for labs and a BP check. Do not see where he had BP checked. Please advise

## 2013-07-05 ENCOUNTER — Encounter: Payer: Self-pay | Admitting: Family Medicine

## 2013-07-05 ENCOUNTER — Ambulatory Visit (INDEPENDENT_AMBULATORY_CARE_PROVIDER_SITE_OTHER): Payer: 59 | Admitting: Family Medicine

## 2013-07-05 VITALS — BP 133/83 | HR 74 | Temp 96.6°F | Ht 69.0 in | Wt 222.0 lb

## 2013-07-05 DIAGNOSIS — I10 Essential (primary) hypertension: Secondary | ICD-10-CM

## 2013-07-05 DIAGNOSIS — E559 Vitamin D deficiency, unspecified: Secondary | ICD-10-CM

## 2013-07-05 DIAGNOSIS — N400 Enlarged prostate without lower urinary tract symptoms: Secondary | ICD-10-CM

## 2013-07-05 DIAGNOSIS — E119 Type 2 diabetes mellitus without complications: Secondary | ICD-10-CM

## 2013-07-05 DIAGNOSIS — K219 Gastro-esophageal reflux disease without esophagitis: Secondary | ICD-10-CM

## 2013-07-05 DIAGNOSIS — Z87828 Personal history of other (healed) physical injury and trauma: Secondary | ICD-10-CM

## 2013-07-05 DIAGNOSIS — IMO0001 Reserved for inherently not codable concepts without codable children: Secondary | ICD-10-CM

## 2013-07-05 DIAGNOSIS — E7800 Pure hypercholesterolemia, unspecified: Secondary | ICD-10-CM

## 2013-07-05 LAB — POCT CBC
GRANULOCYTE PERCENT: 63.6 % (ref 37–80)
HEMATOCRIT: 43.7 % (ref 43.5–53.7)
Hemoglobin: 14.1 g/dL (ref 14.1–18.1)
LYMPH, POC: 2 (ref 0.6–3.4)
MCH, POC: 29.6 pg (ref 27–31.2)
MCHC: 32.3 g/dL (ref 31.8–35.4)
MCV: 91.6 fL (ref 80–97)
MPV: 8.7 fL (ref 0–99.8)
POC Granulocyte: 4.4 (ref 2–6.9)
POC LYMPH %: 29.1 % (ref 10–50)
Platelet Count, POC: 199 10*3/uL (ref 142–424)
RBC: 4.8 M/uL (ref 4.69–6.13)
RDW, POC: 13.4 %
WBC: 6.9 10*3/uL (ref 4.6–10.2)

## 2013-07-05 LAB — POCT UA - MICROALBUMIN: MICROALBUMIN (UR) POC: NEGATIVE mg/L

## 2013-07-05 LAB — POCT GLYCOSYLATED HEMOGLOBIN (HGB A1C): Hemoglobin A1C: 6.7

## 2013-07-05 MED ORDER — LOSARTAN POTASSIUM-HCTZ 50-12.5 MG PO TABS: 1.0000 | ORAL_TABLET | Freq: Every day | ORAL | Status: DC

## 2013-07-05 MED ORDER — DUTASTERIDE 0.5 MG PO CAPS: 0.5000 mg | ORAL_CAPSULE | Freq: Every day | ORAL | Status: DC

## 2013-07-05 NOTE — Progress Notes (Signed)
Subjective:    Patient ID: Roger Palmer, male    DOB: February 07, 1951, 63 y.o.   MRN: [id number]  HPI Pt here for follow up and management of chronic medical problems. The patient is doing well with minimal complaints. He does need to be scheduled for an eye exam and he will get a diabetic foot check today. He also indicates that his insurance will no longer pay for the generic Diovan HCT. He will get lab work done today. He continues to do extremely well following her traumatic accident he had when he fell from a tree about a year ago. He still has some arthralgias in his right hip and he is not fully able to extend the right forearm because of a fracture at his elbow.         Patient Active Problem List   Diagnosis Date Noted  . 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 High risk medication use 08/14/2012  . 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 Ileus 07/23/2012  . Acute kidney injury 07/20/2012  . Hypernatremia 07/20/2012  . Aspiration pneumonia 07/20/2012  . Acute respiratory failure following trauma and surgery 07/16/2012  . Acute blood loss anemia 07/14/2012  . Hypokalemia 07/14/2012  . Fall from tree 07/13/2012  . Hypercholesteremia   . Nephrolithiasis   . Hypertension   . Back pain   . Obesity   . BPH (benign prostatic hypertrophy)   . Gastritis   . Reflux   . Diabetes mellitus without complication   . Right acetabular fracture 07/11/2012  . Open dislocation of right elbow 07/11/2012  . Distal radius fracture, right 07/11/2012  . Fracture of iliac wing 07/11/2012  . Pubic ramus fracture 07/11/2012   Outpatient Encounter Prescriptions as of 07/05/2013  Medication Sig  . atorvastatin (LIPITOR) 40 MG tablet Take 1 tablet (40 mg total) by mouth daily.  Marland Kitchen dutasteride (AVODART) 0.5 MG capsule Take 0.5 mg by mouth daily.  . fenofibrate micronized (LOFIBRA) 134 MG capsule Take 1 capsule by mouth  daily before breakfast  . omeprazole  (PRILOSEC) 20 MG capsule Take 1 capsule by mouth  every day  . tamsulosin (FLOMAX) 0.4 MG CAPS capsule Take 1 capsule by mouth  every day  . valsartan-hydrochlorothiazide (DIOVAN-HCT) 160-25 MG per tablet Take 1 tablet by mouth daily. As directed  . [DISCONTINUED] AVODART 0.5 MG capsule Take 1 capsule (0.5 mg  total) by mouth daily.    Review of Systems  Constitutional: Negative.   HENT: Negative.   Eyes: Negative.   Respiratory: Negative.   Cardiovascular: Negative.   Gastrointestinal: Negative.   Endocrine: Negative.   Genitourinary: Negative.   Musculoskeletal: Negative.   Skin: Negative.   Allergic/Immunologic: Negative.   Neurological: Negative.   Hematological: Negative.   Psychiatric/Behavioral: Negative.        Objective:   Physical Exam  Nursing note and vitals reviewed. Constitutional: He is oriented to person, place, and time. He appears well-developed and well-nourished. No distress.  HENT:  Head: Normocephalic and atraumatic.  Right Ear: External ear normal.  Left Ear: External ear normal.  Mouth/Throat: Oropharynx is clear and moist. No oropharyngeal exudate.   Nasal congestion bilaterally  Eyes: Conjunctivae and EOM are normal. Pupils are equal, round, and reactive to light. Right eye exhibits no discharge. Left eye exhibits no discharge. No scleral icterus.  Neck: Normal range of motion. Neck supple. No thyromegaly present.  No carotid bruits  Cardiovascular: Normal rate, regular rhythm,  normal heart sounds and intact distal pulses.  Exam reveals no gallop and no friction rub.   No murmur heard. At 72 per minute  Pulmonary/Chest: Effort normal and breath sounds normal. No respiratory distress. He has no wheezes. He has no rales. He exhibits no tenderness.  Clear anteriorly and posterior, no axillary adenopathy  Abdominal: Soft. Bowel sounds are normal. He exhibits no mass. There is no tenderness. There is no rebound and no guarding.  No inguinal adenopathy    Musculoskeletal: Normal range of motion. He exhibits no edema and no tenderness.  Patient has limited range of motion of the right forearm do to his elbow injury and fracture. He is unable to fully extend the forearm.  Lymphadenopathy:    He has no cervical adenopathy.  Neurological: He is alert and oriented to person, place, and time. He has normal reflexes. No cranial nerve deficit.  Skin: Skin is warm and dry. No rash noted. No erythema. No pallor.  Psychiatric: He has a normal mood and affect. His behavior is normal. Judgment and thought content normal.   BP 133/83  Pulse 74  Temp(Src) 96.6 F (35.9 C) (Oral)  Ht _0  (1.753 m)  Wt 222 lb (100.699 kg)  BMI 32.77 kg/m2        Assessment & Plan:  1. Hypertension - POCT CBC - POCT UA - Microalbumin - BMP8+EGFR - Hepatic function panel  2. BPH (benign prostatic hypertrophy) - POCT CBC  3. Diabetes mellitus without complication - POCT CBC - POCT glycosylated hemoglobin (Hb A1C) - POCT UA - Microalbumin  4. Hypercholesteremia - POCT CBC - NMR, lipoprofile  5. Vitamin D deficiency - Vit D  25 hydroxy (rtn osteoporosis monitoring)  6. Reflux  7. History of trauma -Patient is considering further surgery to give him greater range of motion of right arm.  Meds ordered this encounter  Medications  . valsartan-hydrochlorothiazide (DIOVAN-HCT) 160-25 MG per tablet    Sig: Take 1 tablet by mouth daily. As directed  . DISCONTD: dutasteride (AVODART) 0.5 MG capsule    Sig: Take 0.5 mg by mouth daily.  Marland Kitchen losartan-hydrochlorothiazide (HYZAAR) 50-12.5 MG per tablet    Sig: Take 1 tablet by mouth daily. As directed    Dispense:  90 tablet    Refill:  3  . dutasteride (AVODART) 0.5 MG capsule    Sig: Take 1 capsule (0.5 mg total) by mouth daily.    Dispense:  90 capsule    Refill:  3   Patient Instructions  Continue current medications. Continue good therapeutic lifestyle changes which include good diet and  exercise. Fall precautions discussed with patient. Schedule your flu vaccine if you haven't had it yet If you are over 75 years old - you may need Prevnar 85 or the adult Pneumonia vaccine. Do not forget to get an eye exam Brain blood pressure readings by in 2-3 weeks for review and have our nurse recheck her blood pressure in this office At that point in time you'll also need to get another BMP   Arrie Senate MD

## 2013-07-05 NOTE — Patient Instructions (Addendum)
Continue current medications. Continue good therapeutic lifestyle changes which include good diet and exercise. Fall precautions discussed with patient. Schedule your flu vaccine if you haven't had it yet If you are over 64 years old - you may need Prevnar 3 or the adult Pneumonia vaccine. Do not forget to get an eye exam Brain blood pressure readings by in 2-3 weeks for review and have our nurse recheck her blood pressure in this office At that point in time you'll also need to get another BMP

## 2013-07-07 LAB — BMP8+EGFR
BUN/Creatinine Ratio: 15 (ref 10–22)
BUN: 17 mg/dL (ref 8–27)
CO2: 24 mmol/L (ref 18–29)
CREATININE: 1.11 mg/dL (ref 0.76–1.27)
Calcium: 10 mg/dL (ref 8.6–10.2)
Chloride: 104 mmol/L (ref 97–108)
GFR calc non Af Amer: 70 mL/min/{1.73_m2} (ref >59)
GFR, EST AFRICAN AMERICAN: 81 mL/min/{1.73_m2} (ref >59)
Glucose: 143 mg/dL — ABNORMAL HIGH (ref 65–99)
Potassium: 3.8 mmol/L (ref 3.5–5.2)
Sodium: 145 mmol/L — ABNORMAL HIGH (ref 134–144)

## 2013-07-07 LAB — NMR, LIPOPROFILE
Cholesterol: 146 mg/dL (ref <200)
HDL CHOLESTEROL BY NMR: 55 mg/dL (ref >=40)
HDL Particle Number: 38.7 umol/L (ref >=30.5)
LDL PARTICLE NUMBER: 1177 nmol/L — AB (ref <1000)
LDL Size: 20.5 nm — ABNORMAL LOW (ref >20.5)
LDLC SERPL CALC-MCNC: 79 mg/dL (ref <100)
LP-IR Score: 55 — ABNORMAL HIGH (ref <=45)
SMALL LDL PARTICLE NUMBER: 642 nmol/L — AB (ref <=527)
Triglycerides by NMR: 62 mg/dL (ref <150)

## 2013-07-07 LAB — HEPATIC FUNCTION PANEL
ALBUMIN: 4.4 g/dL (ref 3.6–4.8)
ALK PHOS: 60 IU/L (ref 39–117)
ALT: 21 IU/L (ref 0–44)
AST: 22 IU/L (ref 0–40)
Bilirubin, Direct: 0.11 mg/dL (ref 0.00–0.40)
TOTAL PROTEIN: 6.9 g/dL (ref 6.0–8.5)
Total Bilirubin: 0.4 mg/dL (ref 0.0–1.2)

## 2013-07-07 LAB — VITAMIN D 25 HYDROXY (VIT D DEFICIENCY, FRACTURES): Vit D, 25-Hydroxy: 23.6 ng/mL — ABNORMAL LOW (ref 30.0–100.0)

## 2013-07-16 ENCOUNTER — Other Ambulatory Visit: Payer: Self-pay | Admitting: *Deleted

## 2013-07-16 MED ORDER — VITAMIN D (ERGOCALCIFEROL) 1.25 MG (50000 UNIT) PO CAPS: 50000.0000 [IU] | ORAL_CAPSULE | ORAL | Status: DC

## 2013-09-01 ENCOUNTER — Ambulatory Visit (INDEPENDENT_AMBULATORY_CARE_PROVIDER_SITE_OTHER): Payer: 59

## 2013-09-01 ENCOUNTER — Telehealth: Payer: Self-pay | Admitting: *Deleted

## 2013-09-01 ENCOUNTER — Other Ambulatory Visit: Payer: Self-pay | Admitting: Family Medicine

## 2013-09-01 VITALS — BP 140/80 | HR 91

## 2013-09-01 DIAGNOSIS — I10 Essential (primary) hypertension: Secondary | ICD-10-CM

## 2013-09-01 MED ORDER — LOSARTAN POTASSIUM 50 MG PO TABS: ORAL_TABLET | ORAL | Status: DC

## 2013-09-01 MED ORDER — LOSARTAN POTASSIUM-HCTZ 100-12.5 MG PO TABS: 1.0000 | ORAL_TABLET | Freq: Every day | ORAL | Status: DC

## 2013-09-01 NOTE — Telephone Encounter (Signed)
Please see if antara would be covered

## 2013-09-01 NOTE — Telephone Encounter (Signed)
Patient states that fenofibrate is no longer covered by [facility]. Would like med changed to something else and sent in for mail order. Please advise

## 2013-09-01 NOTE — Progress Notes (Signed)
   Subjective:    Patient ID: Roger Palmer, male    DOB: [birth year], 63 y.o.   MRN: [id number]  HPI  Patient comes in to office today for a recheck of BP  Review of Systems     Objective:   Physical Exam        Assessment & Plan:

## 2013-09-02 ENCOUNTER — Encounter: Payer: Self-pay | Admitting: *Deleted

## 2013-09-02 NOTE — Progress Notes (Signed)
Patient came in on 09/01/13 for a recheck of BP- Dwm has reviewed and has contacted the patient with the following instructions.   His medication we be increased to 100/12.5 of Losartan HCT. A new RX for was sent to mail order pharm  For complete new dose and a low dose losartan 50 was sent in to local pharm to add to what patient already has at home ( to use until mail order comes in) Patient aware by DWM.

## 2013-09-02 NOTE — Progress Notes (Signed)
Went to pharmacy

## 2013-09-03 MED ORDER — FENOFIBRATE 160 MG PO TABS: 160.0000 mg | ORAL_TABLET | Freq: Every day | ORAL | Status: DC

## 2013-09-03 NOTE — Telephone Encounter (Signed)
Please review. Patient thinks he may need a prior authorization

## 2013-09-03 NOTE — Telephone Encounter (Signed)
I called ins co and Antara is not on the formulary however fenofibrate 160 and fenofibrate 54mg  are covered as tier 1 just fenobibrate 134 is excluded. Hope this helps

## 2013-09-03 NOTE — Telephone Encounter (Signed)
Pt's wife notified of change from [place] to fenofibrate Okayed by Vickki Hearing understanding

## 2013-10-02 ENCOUNTER — Other Ambulatory Visit: Payer: Self-pay | Admitting: Family Medicine

## 2013-11-09 ENCOUNTER — Encounter: Payer: Self-pay | Admitting: Family Medicine

## 2013-11-09 ENCOUNTER — Ambulatory Visit (INDEPENDENT_AMBULATORY_CARE_PROVIDER_SITE_OTHER): Payer: 59 | Admitting: Family Medicine

## 2013-11-09 VITALS — BP 129/81 | HR 81 | Temp 96.9°F | Ht 69.0 in | Wt 222.0 lb

## 2013-11-09 DIAGNOSIS — G5791 Unspecified mononeuropathy of right lower limb: Secondary | ICD-10-CM

## 2013-11-09 DIAGNOSIS — L570 Actinic keratosis: Secondary | ICD-10-CM

## 2013-11-09 DIAGNOSIS — J301 Allergic rhinitis due to pollen: Secondary | ICD-10-CM

## 2013-11-09 DIAGNOSIS — I10 Essential (primary) hypertension: Secondary | ICD-10-CM

## 2013-11-09 DIAGNOSIS — E559 Vitamin D deficiency, unspecified: Secondary | ICD-10-CM

## 2013-11-09 DIAGNOSIS — N400 Enlarged prostate without lower urinary tract symptoms: Secondary | ICD-10-CM

## 2013-11-09 DIAGNOSIS — E119 Type 2 diabetes mellitus without complications: Secondary | ICD-10-CM

## 2013-11-09 DIAGNOSIS — E785 Hyperlipidemia, unspecified: Secondary | ICD-10-CM

## 2013-11-09 DIAGNOSIS — G5790 Unspecified mononeuropathy of unspecified lower limb: Secondary | ICD-10-CM

## 2013-11-09 LAB — POCT CBC
GRANULOCYTE PERCENT: 71.5 % (ref 37–80)
HEMATOCRIT: 41 % — AB (ref 43.5–53.7)
Hemoglobin: 13.7 g/dL — AB (ref 14.1–18.1)
Lymph, poc: 1.6 (ref 0.6–3.4)
MCH, POC: 30.3 pg (ref 27–31.2)
MCHC: 33.3 g/dL (ref 31.8–35.4)
MCV: 90.8 fL (ref 80–97)
MPV: 9.3 fL (ref 0–99.8)
POC Granulocyte: 4.4 (ref 2–6.9)
POC LYMPH PERCENT: 25.5 %L (ref 10–50)
Platelet Count, POC: 204 10*3/uL (ref 142–424)
RBC: 4.5 M/uL — AB (ref 4.69–6.13)
RDW, POC: 13.3 %
WBC: 6.2 10*3/uL (ref 4.6–10.2)

## 2013-11-09 LAB — POCT GLYCOSYLATED HEMOGLOBIN (HGB A1C): Hemoglobin A1C: 7.2

## 2013-11-09 NOTE — Addendum Note (Signed)
Addended by: Zannie Cove on: 11/09/2013 08:48 AM   Modules accepted: Orders

## 2013-11-09 NOTE — Progress Notes (Signed)
Subjective:    Patient ID: Roger Palmer, male    DOB: Sep 18, 1950, 63 y.o.   MRN: [id number]  HPI Pt here for follow up and management of chronic medical problems. The patient is doing well overall. He neurologist that he needs to work on his diet more closely. His home blood sugars have been running around 150 fasting. From this severe fall that he had over a year ago he is still unable to fully extend his right arm. He still has pain in the right arm and some pain in the hip on the right side. He does complain today of some numbness in his right big toe and foot. There is no pain. He also complains of nasal congestion.         Patient Active Problem List   Diagnosis Date Noted  . Hyperlipidemia 11/09/2013  . 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 High risk medication use 08/14/2012  . 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 Ileus 07/23/2012  . Acute kidney injury 07/20/2012  . Hypernatremia 07/20/2012  . Aspiration pneumonia 07/20/2012  . Acute respiratory failure following trauma and surgery 07/16/2012  . Acute blood loss anemia 07/14/2012  . Hypokalemia 07/14/2012  . Fall from tree 07/13/2012  . Hypercholesteremia   . Nephrolithiasis   . Hypertension   . Back pain   . Obesity   . BPH (benign prostatic hypertrophy)   . Gastritis   . Reflux   . Diabetes mellitus without complication   . Right acetabular fracture 07/11/2012  . Open dislocation of right elbow 07/11/2012  . Distal radius fracture, right 07/11/2012  . Fracture of iliac wing 07/11/2012  . Pubic ramus fracture 07/11/2012   Outpatient Encounter Prescriptions as of 11/09/2013  Medication Sig  . atorvastatin (LIPITOR) 40 MG tablet Take 1 tablet by mouth  daily  . dutasteride (AVODART) 0.5 MG capsule Take 1 capsule (0.5 mg total) by mouth daily.  . fenofibrate 160 MG tablet Take 1 tablet (160 mg total) by mouth daily.  Marland Kitchen losartan-hydrochlorothiazide (HYZAAR) 100-12.5 MG per  tablet Take 1 tablet by mouth daily.  Marland Kitchen omeprazole (PRILOSEC) 20 MG capsule Take 1 capsule by mouth  every day  . tamsulosin (FLOMAX) 0.4 MG CAPS capsule Take 1 capsule by mouth  every day  . Vitamin D, Ergocalciferol, (DRISDOL) 50000 UNITS CAPS capsule Take 1 capsule (50,000 Units total) by mouth every 7 (seven) days.  . [DISCONTINUED] fenofibrate micronized (LOFIBRA) 134 MG capsule Take 1 capsule by mouth  daily before breakfast  . [DISCONTINUED] losartan (COZAAR) 50 MG tablet Take 1 by mouth daily with the losartan Hct 50-12.5    Review of Systems  Constitutional: Negative.   HENT: Negative.   Eyes: Negative.   Respiratory: Negative.   Cardiovascular: Negative.   Gastrointestinal: Negative.   Endocrine: Negative.   Genitourinary: Negative.   Musculoskeletal: Negative.   Skin: Negative.   Allergic/Immunologic: Negative.   Neurological: Negative.   Hematological: Negative.   Psychiatric/Behavioral: Negative.        Objective:   Physical Exam  Nursing note and vitals reviewed. Constitutional: He is oriented to person, place, and time. He appears well-developed and well-nourished. No distress.  HENT:  Head: Normocephalic and atraumatic.  Right Ear: External ear normal.  Left Ear: External ear normal.  Mouth/Throat: Oropharynx is clear and moist. No oropharyngeal exudate.  Nasal turbinate congestion bilaterally  Eyes: Conjunctivae and EOM are normal. Pupils are equal, round, and reactive to  light. Right eye exhibits no discharge. Left eye exhibits no discharge. No scleral icterus.  Neck: Normal range of motion. Neck supple. No thyromegaly present.  No carotid bruits  Cardiovascular: Normal rate, regular rhythm, normal heart sounds and intact distal pulses.  Exam reveals no gallop and no friction rub.   No murmur heard. At 72 per minute. The right radial pulse was slightly diminished compared to the left radial pulse.  Pulmonary/Chest: Effort normal and breath sounds normal. No  respiratory distress. He has no wheezes. He has no rales. He exhibits no tenderness.  No axillary nodes  Abdominal: Soft. Bowel sounds are normal. He exhibits no mass. There is no tenderness. There is no rebound and no guarding.   Obesity present, no inguinal nodes and good inguinal pulses.  Musculoskeletal: Normal range of motion. He exhibits no edema.  Lymphadenopathy:    He has no cervical adenopathy.  Neurological: He is alert and oriented to person, place, and time. He has normal reflexes. No cranial nerve deficit.  Skin: Skin is warm and dry. No rash noted. No erythema. No pallor.  Multiple small actinic keratoses on face  Psychiatric: He has a normal mood and affect. His behavior is normal. Judgment and thought content normal.   BP 129/81  Pulse 81  Temp(Src) 96.9 F (36.1 C) (Oral)  Ht $R'5\' 9"'MK$  (1.753 m)  Wt 222 lb (100.699 kg)  BMI 32.77 kg/m2        Assessment & Plan:   1. Essential hypertension - POCT CBC - BMP8+EGFR - Hepatic function panel  2. Diabetes mellitus without complication - POCT CBC - POCT glycosylated hemoglobin (Hb A1C)  3. BPH (benign prostatic hypertrophy) - POCT CBC  4. Hyperlipidemia - POCT CBC - NMR, lipoprofile  5. Vitamin D deficiency - Vit D  25 hydroxy (rtn osteoporosis monitoring)  6. Allergic rhinitis due to pollen  7. Multiple actinic keratoses  8. Neuropathy of right foot  No orders of the defined types were placed in this encounter.   Patient Instructions  Continue current medications. Continue good therapeutic lifestyle changes which include good diet and exercise. Fall precautions discussed with patient. If an FOBT was given today- please return it to our front desk. If you are over 7 years old - you may need Prevnar 89 or the adult Pneumonia vaccine.  Please do not forget to get your eye exam. Continue to monitor your blood sugars closely and check her feet regularly Please try to work on weight reduction. Peak less  bread and potatoes and drink more water We will arrange an appointment for you to see the dermatologist for your actinic keratoses on your face. We will call you with your lab work once those results are available. Flonase or Nasacort over-the-counter may help your allergic rhinitis If the problems with the numbness in your right foot continued please let us know and we will arrange to get LS spine films     Arrie Senate MD

## 2013-11-09 NOTE — Patient Instructions (Addendum)
Continue current medications. Continue good therapeutic lifestyle changes which include good diet and exercise. Fall precautions discussed with patient. If an FOBT was given today- please return it to our front desk. If you are over 64 years old - you may need Prevnar 57 or the adult Pneumonia vaccine.  Please do not forget to get your eye exam. Continue to monitor your blood sugars closely and check her feet regularly Please try to work on weight reduction. Peak less bread and potatoes and drink more water We will arrange an appointment for you to see the dermatologist for your actinic keratoses on your face. We will call you with your lab work once those results are available. Flonase or Nasacort over-the-counter may help your allergic rhinitis If the problems with the numbness in your right foot continued please let us know and we will arrange to get LS spine films

## 2013-11-10 LAB — NMR, LIPOPROFILE
CHOLESTEROL: 141 mg/dL (ref 100–199)
HDL Cholesterol by NMR: 44 mg/dL (ref >39)
HDL PARTICLE NUMBER: 32.6 umol/L (ref >=30.5)
LDL Particle Number: 980 nmol/L (ref <1000)
LDL SIZE: 20.1 nm (ref >20.5)
LDLC SERPL CALC-MCNC: 80 mg/dL (ref 0–99)
LP-IR Score: 56 — ABNORMAL HIGH (ref <=45)
Small LDL Particle Number: 572 nmol/L — ABNORMAL HIGH (ref <=527)
Triglycerides by NMR: 83 mg/dL (ref 0–149)

## 2013-11-10 LAB — HEPATIC FUNCTION PANEL
ALBUMIN: 4.4 g/dL (ref 3.6–4.8)
ALK PHOS: 50 IU/L (ref 39–117)
ALT: 31 IU/L (ref 0–44)
AST: 27 IU/L (ref 0–40)
BILIRUBIN DIRECT: 0.14 mg/dL (ref 0.00–0.40)
BILIRUBIN TOTAL: 0.5 mg/dL (ref 0.0–1.2)
TOTAL PROTEIN: 6.7 g/dL (ref 6.0–8.5)

## 2013-11-10 LAB — VITAMIN D 25 HYDROXY (VIT D DEFICIENCY, FRACTURES): Vit D, 25-Hydroxy: 31.1 ng/mL (ref 30.0–100.0)

## 2013-11-10 LAB — BMP8+EGFR
BUN/Creatinine Ratio: 13 (ref 10–22)
BUN: 16 mg/dL (ref 8–27)
CO2: 24 mmol/L (ref 18–29)
Calcium: 9.8 mg/dL (ref 8.6–10.2)
Chloride: 105 mmol/L (ref 97–108)
Creatinine, Ser: 1.23 mg/dL (ref 0.76–1.27)
GFR, EST AFRICAN AMERICAN: 71 mL/min/{1.73_m2} (ref >59)
GFR, EST NON AFRICAN AMERICAN: 62 mL/min/{1.73_m2} (ref >59)
Glucose: 161 mg/dL — ABNORMAL HIGH (ref 65–99)
Potassium: 4 mmol/L (ref 3.5–5.2)
Sodium: 143 mmol/L (ref 134–144)

## 2013-12-08 ENCOUNTER — Other Ambulatory Visit: Payer: Self-pay | Admitting: Family Medicine

## 2013-12-13 ENCOUNTER — Telehealth: Payer: Self-pay | Admitting: Pharmacist

## 2013-12-13 MED ORDER — FINASTERIDE 5 MG PO TABS
5.0000 mg | ORAL_TABLET | Freq: Every day | ORAL | Status: DC
Start: 2013-12-13 — End: 2014-04-21

## 2013-12-13 NOTE — Telephone Encounter (Signed)
Received correspondence from [facility] to change Avodart to terazosin.  However when reviewing patient's medication records his is also taking tamsulosin / Flomax which is an alpha adrenergic antagonist like terazosin but is more uroselective (probable less risk of hypotension).  Called patient and discussed reason for switch and he states he initiated request due to cost of Avodart.  I don't think terazosin is a good switch but there is finasteride 5mg  which is in same therapeutic category as Avodart that could be tried.  Discussed with patient and he would like to try.  Rx sent to [facility].

## 2014-01-09 ENCOUNTER — Other Ambulatory Visit: Payer: Self-pay | Admitting: Family Medicine

## 2014-02-22 LAB — HM DIABETES EYE EXAM

## 2014-02-25 ENCOUNTER — Encounter: Payer: Self-pay | Admitting: Family Medicine

## 2014-02-25 ENCOUNTER — Ambulatory Visit (INDEPENDENT_AMBULATORY_CARE_PROVIDER_SITE_OTHER): Payer: 59 | Admitting: Family Medicine

## 2014-02-25 ENCOUNTER — Encounter (INDEPENDENT_AMBULATORY_CARE_PROVIDER_SITE_OTHER): Payer: Self-pay

## 2014-02-25 VITALS — BP 118/73 | HR 76 | Temp 97.0°F | Ht 69.0 in | Wt 219.0 lb

## 2014-02-25 DIAGNOSIS — E119 Type 2 diabetes mellitus without complications: Secondary | ICD-10-CM

## 2014-02-25 DIAGNOSIS — Z23 Encounter for immunization: Secondary | ICD-10-CM

## 2014-02-25 DIAGNOSIS — B3749 Other urogenital candidiasis: Secondary | ICD-10-CM

## 2014-02-25 DIAGNOSIS — N400 Enlarged prostate without lower urinary tract symptoms: Secondary | ICD-10-CM

## 2014-02-25 DIAGNOSIS — E559 Vitamin D deficiency, unspecified: Secondary | ICD-10-CM

## 2014-02-25 DIAGNOSIS — E785 Hyperlipidemia, unspecified: Secondary | ICD-10-CM

## 2014-02-25 DIAGNOSIS — Z0000 Encounter for general adult medical examination without abnormal findings: Secondary | ICD-10-CM

## 2014-02-25 DIAGNOSIS — I10 Essential (primary) hypertension: Secondary | ICD-10-CM

## 2014-02-25 LAB — POCT CBC
Granulocyte percent: 65.4 %G (ref 37–80)
HCT, POC: 41.3 % — AB (ref 43.5–53.7)
HEMOGLOBIN: 13.6 g/dL — AB (ref 14.1–18.1)
LYMPH, POC: 1.8 (ref 0.6–3.4)
MCH, POC: 29.8 pg (ref 27–31.2)
MCHC: 33 g/dL (ref 31.8–35.4)
MCV: 90.2 fL (ref 80–97)
MPV: 9.2 fL (ref 0–99.8)
POC GRANULOCYTE: 4.1 (ref 2–6.9)
POC LYMPH PERCENT: 28.5 %L (ref 10–50)
Platelet Count, POC: 205 10*3/uL (ref 142–424)
RBC: 4.6 M/uL — AB (ref 4.69–6.13)
RDW, POC: 13.7 %
WBC: 6.2 10*3/uL (ref 4.6–10.2)

## 2014-02-25 LAB — POCT GLYCOSYLATED HEMOGLOBIN (HGB A1C): HEMOGLOBIN A1C: 7.1

## 2014-02-25 LAB — POCT UA - MICROSCOPIC ONLY
BACTERIA, U MICROSCOPIC: NEGATIVE
Casts, Ur, LPF, POC: NEGATIVE
Crystals, Ur, HPF, POC: NEGATIVE
MUCUS UA: NEGATIVE
RBC, urine, microscopic: NEGATIVE
Yeast, UA: NEGATIVE

## 2014-02-25 LAB — POCT URINALYSIS DIPSTICK
BILIRUBIN UA: NEGATIVE
GLUCOSE UA: NEGATIVE
KETONES UA: NEGATIVE
Leukocytes, UA: NEGATIVE
Nitrite, UA: NEGATIVE
Protein, UA: NEGATIVE
RBC UA: NEGATIVE
SPEC GRAV UA: 1.01
Urobilinogen, UA: NEGATIVE
pH, UA: 7.5

## 2014-02-25 NOTE — Addendum Note (Signed)
Addended by: Selmer Dominion on: 02/25/2014 09:43 AM   Modules accepted: Orders

## 2014-02-25 NOTE — Patient Instructions (Addendum)
Medicare Annual Wellness Visit  [facility] and the medical providers at [facility] strive to bring you the best medical care.  In doing so we not only want to address your current medical conditions and concerns but also to detect new conditions early and prevent illness, disease and health-related problems.    Medicare offers a yearly Wellness Visit which allows our clinical staff to assess your need for preventative services including immunizations, lifestyle education, counseling to decrease risk of preventable diseases and screening for fall risk and other medical concerns.    This visit is provided free of charge (no copay) for all Medicare recipients. The clinical pharmacists at [facility] have begun to conduct these Wellness Visits which will also include a thorough review of all your medications.    As you primary medical provider recommend that you make an appointment for your Annual Wellness Visit if you have not done so already this year.  You may set up this appointment before you leave today or you may call back ([phone]) and schedule an appointment.  Please make sure when you call that you mention that you are scheduling your Annual Wellness Visit with the clinical pharmacist so that the appointment may be made for the proper length of time.       Continue current medications. Continue good therapeutic lifestyle changes which include good diet and exercise. Fall precautions discussed with patient. If an FOBT was given today- please return it to our front desk. If you are over 67 years old - you may need Prevnar 92 or the adult Pneumonia vaccine.  Flu Shots will be available at our office starting mid- September. Please call and schedule a FLU CLINIC APPOINTMENT.   The patient should have yearly eye exams If problems continue with the police call us and let us know Continue with good therapeutic lifestyle  changes which include diet and exercise  Continue to monitor blood sugars and blood pressures when possible

## 2014-02-25 NOTE — Addendum Note (Signed)
Addended by: Zannie Cove on: 02/25/2014 09:03 AM   Modules accepted: Orders

## 2014-02-25 NOTE — Progress Notes (Signed)
Subjective:    Patient ID: Roger Palmer, male    DOB: 03-11-1951, 63 y.o.   MRN: [id number]  HPI Patient is here today for annual wellness exam and follow up of chronic medical problems. The patient is doing well with no specific complaints. Then a floater in his right. He is due for his annual exam today. He will need a PSA, prostate, and lab work. He will also get a flu shot and will be given FOBT to return. His BMI is 32.3. The patient has had a recent eye exam and was told that the floater the patient was observing was okay and he she would continue to watch this          Patient Active Problem List   Diagnosis Date Noted  . Hyperlipidemia 11/09/2013  . 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 High risk medication use 08/14/2012  . 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 Ileus 07/23/2012  . Acute kidney injury 07/20/2012  . Hypernatremia 07/20/2012  . Aspiration pneumonia 07/20/2012  . Acute respiratory failure following trauma and surgery 07/16/2012  . Acute blood loss anemia 07/14/2012  . Hypokalemia 07/14/2012  . Fall from tree 07/13/2012  . Hypercholesteremia   . Nephrolithiasis   . Hypertension   . Back pain   . Obesity   . BPH (benign prostatic hypertrophy)   . Gastritis   . Reflux   . Diabetes mellitus without complication   . Right acetabular fracture 07/11/2012  . Open dislocation of right elbow 07/11/2012  . Distal radius fracture, right 07/11/2012  . Fracture of iliac wing 07/11/2012  . Pubic ramus fracture 07/11/2012   Outpatient Encounter Prescriptions as of 02/25/2014  Medication Sig  . atorvastatin (LIPITOR) 40 MG tablet Take 1 tablet by mouth  daily  . fenofibrate 160 MG tablet Take 1 tablet (160 mg total) by mouth daily.  . finasteride (PROSCAR) 5 MG tablet Take 1 tablet (5 mg total) by mouth daily.  Marland Kitchen losartan-hydrochlorothiazide (HYZAAR) 100-12.5 MG per tablet Take 1 tablet by mouth daily.  Marland Kitchen omeprazole  (PRILOSEC) 20 MG capsule Take 1 capsule by mouth  every day  . tamsulosin (FLOMAX) 0.4 MG CAPS capsule Take 1 capsule by mouth  every day  . Vitamin D, Ergocalciferol, (DRISDOL) 50000 UNITS CAPS capsule Take 1 capsule (50,000 Units total) by mouth every 7 (seven) days.    Review of Systems  Constitutional: Negative.   HENT: Negative.   Eyes: Negative.        Right eye floater  Respiratory: Negative.   Cardiovascular: Negative.   Gastrointestinal: Negative.   Endocrine: Negative.   Genitourinary: Negative.   Musculoskeletal: Negative.   Skin: Negative.   Allergic/Immunologic: Negative.   Neurological: Negative.   Hematological: Negative.   Psychiatric/Behavioral: Negative.        Objective:   Physical Exam  Nursing note and vitals reviewed. Constitutional: He is oriented to person, place, and time. He appears well-developed and well-nourished. No distress.  HENT:  Head: Normocephalic and atraumatic.  Right Ear: External ear normal.  Left Ear: External ear normal.  Mouth/Throat: Oropharynx is clear and moist. No oropharyngeal exudate.  Nasal congestion bilaterally  Eyes: Conjunctivae and EOM are normal. Pupils are equal, round, and reactive to light. Right eye exhibits no discharge. Left eye exhibits no discharge. No scleral icterus.  There may be some slight. Periorbital swelling bilaterally, allergy-related  Neck: Normal range of motion. Neck supple. No thyromegaly present.  No carotid bruit  Cardiovascular: Normal rate, regular rhythm, normal heart sounds and intact distal pulses.  Exam reveals no gallop and no friction rub.   No murmur heard. At 72 per minute  Pulmonary/Chest: Effort normal and breath sounds normal. No respiratory distress. He has no wheezes. He has no rales. He exhibits no tenderness.  No axillary nodes  Abdominal: Soft. Bowel sounds are normal. He exhibits no mass. There is no tenderness. There is no rebound and no guarding.  Obesity present    Genitourinary: Rectum normal and penis normal.  The prostate was enlarged and smooth. There were no lumps or masses. The rectal exam was negative for masses. There were no inguinal hernias present and the external genitalia were within normal limits. There were no inguinal nodes  Musculoskeletal: Normal range of motion. He exhibits no edema and no tenderness.  The patient has limited ability to fully extend the right arm and this is secondary to his accident almost 2 years ago.  Lymphadenopathy:    He has no cervical adenopathy.  Neurological: He is alert and oriented to person, place, and time. He has normal reflexes.  Skin: Skin is warm and dry. No rash noted. No erythema. No pallor.  Psychiatric: He has a normal mood and affect. His behavior is normal. Judgment and thought content normal.   BP 118/73  Pulse 76  Temp(Src) 97 F (36.1 C) (Oral)  Ht _0  (1.753 m)  Wt 219 lb (99.338 kg)  BMI 32.33 kg/m2  EKG: Sinus rhythm with short PR interval       Assessment & Plan:  1. Essential hypertension - POCT CBC - BMP8+EGFR - Hepatic function panel - EKG 12-Lead  2. Hyperlipidemia - POCT CBC - NMR, lipoprofile  3. BPH (benign prostatic hypertrophy) - POCT CBC - POCT UA - Microscopic Only - POCT urinalysis dipstick - PSA, total and free  4. Diabetes mellitus without complication - POCT CBC - POCT glycosylated hemoglobin (Hb A1C) - EKG 12-Lead  5. Annual physical exam - POCT CBC - POCT glycosylated hemoglobin (Hb A1C) - POCT UA - Microscopic Only - POCT urinalysis dipstick - BMP8+EGFR - Hepatic function panel - NMR, lipoprofile - PSA, total and free - Vit D  25 hydroxy (rtn osteoporosis monitoring) - EKG 12-Lead  6. Vitamin D deficiency - Vit D  25 hydroxy (rtn osteoporosis monitoring)  Patient Instructions                       Medicare Annual Wellness Visit  [facility] and the medical providers at [facility] strive to bring you the  best medical care.  In doing so we not only want to address your current medical conditions and concerns but also to detect new conditions early and prevent illness, disease and health-related problems.    Medicare offers a yearly Wellness Visit which allows our clinical staff to assess your need for preventative services including immunizations, lifestyle education, counseling to decrease risk of preventable diseases and screening for fall risk and other medical concerns.    This visit is provided free of charge (no copay) for all Medicare recipients. The clinical pharmacists at [facility] have begun to conduct these Wellness Visits which will also include a thorough review of all your medications.    As you primary medical provider recommend that you make an appointment for your Annual Wellness Visit if you have not done so already this year.  You may  set up this appointment before you leave today or you may call back ([phone]) and schedule an appointment.  Please make sure when you call that you mention that you are scheduling your Annual Wellness Visit with the clinical pharmacist so that the appointment may be made for the proper length of time.       Continue current medications. Continue good therapeutic lifestyle changes which include good diet and exercise. Fall precautions discussed with patient. If an FOBT was given today- please return it to our front desk. If you are over 60 years old - you may need Prevnar 2 or the adult Pneumonia vaccine.  Flu Shots will be available at our office starting mid- September. Please call and schedule a FLU CLINIC APPOINTMENT.   The patient should have yearly eye exams If problems continue with the police call us and let us know Continue with good therapeutic lifestyle changes which include diet and exercise  Continue to monitor blood sugars and blood pressures when possible   Arrie Senate MD

## 2014-02-26 ENCOUNTER — Other Ambulatory Visit: Payer: Self-pay | Admitting: Family Medicine

## 2014-02-26 LAB — BMP8+EGFR
BUN/Creatinine Ratio: 12 (ref 10–22)
BUN: 15 mg/dL (ref 8–27)
CO2: 24 mmol/L (ref 18–29)
CREATININE: 1.22 mg/dL (ref 0.76–1.27)
Calcium: 9.6 mg/dL (ref 8.6–10.2)
Chloride: 104 mmol/L (ref 97–108)
GFR calc non Af Amer: 62 mL/min/{1.73_m2} (ref >59)
GFR, EST AFRICAN AMERICAN: 72 mL/min/{1.73_m2} (ref >59)
Glucose: 140 mg/dL — ABNORMAL HIGH (ref 65–99)
Potassium: 4.2 mmol/L (ref 3.5–5.2)
Sodium: 143 mmol/L (ref 134–144)

## 2014-02-26 LAB — HEPATIC FUNCTION PANEL
ALBUMIN: 4.4 g/dL (ref 3.6–4.8)
ALK PHOS: 52 IU/L (ref 39–117)
ALT: 23 IU/L (ref 0–44)
AST: 23 IU/L (ref 0–40)
BILIRUBIN TOTAL: 0.4 mg/dL (ref 0.0–1.2)
Bilirubin, Direct: 0.12 mg/dL (ref 0.00–0.40)
Total Protein: 7.1 g/dL (ref 6.0–8.5)

## 2014-02-26 LAB — NMR, LIPOPROFILE
CHOLESTEROL: 163 mg/dL (ref 100–199)
HDL CHOLESTEROL BY NMR: 46 mg/dL (ref >39)
HDL Particle Number: 32 umol/L (ref >=30.5)
LDL Particle Number: 1164 nmol/L — ABNORMAL HIGH (ref <1000)
LDL Size: 20.9 nm (ref >20.5)
LDLC SERPL CALC-MCNC: 101 mg/dL — ABNORMAL HIGH (ref 0–99)
LP-IR SCORE: 59 — AB (ref <=45)
SMALL LDL PARTICLE NUMBER: 389 nmol/L (ref <=527)
Triglycerides by NMR: 79 mg/dL (ref 0–149)

## 2014-02-26 LAB — PSA, TOTAL AND FREE
PSA FREE PCT: 42 %
PSA, Free: 0.21 ng/mL
PSA: 0.5 ng/mL (ref 0.0–4.0)

## 2014-02-26 LAB — VITAMIN D 25 HYDROXY (VIT D DEFICIENCY, FRACTURES): Vit D, 25-Hydroxy: 30.4 ng/mL (ref 30.0–100.0)

## 2014-02-27 LAB — URINE CULTURE

## 2014-03-31 IMAGING — CR DG CHEST 1V PORT
2 series · 2 of 2 positions shown · non-contrast
Comparison: None.

CLINICAL DATA: Trauma, fall

PORTABLE CHEST - 1 VIEW

[AP (1 of 2)]
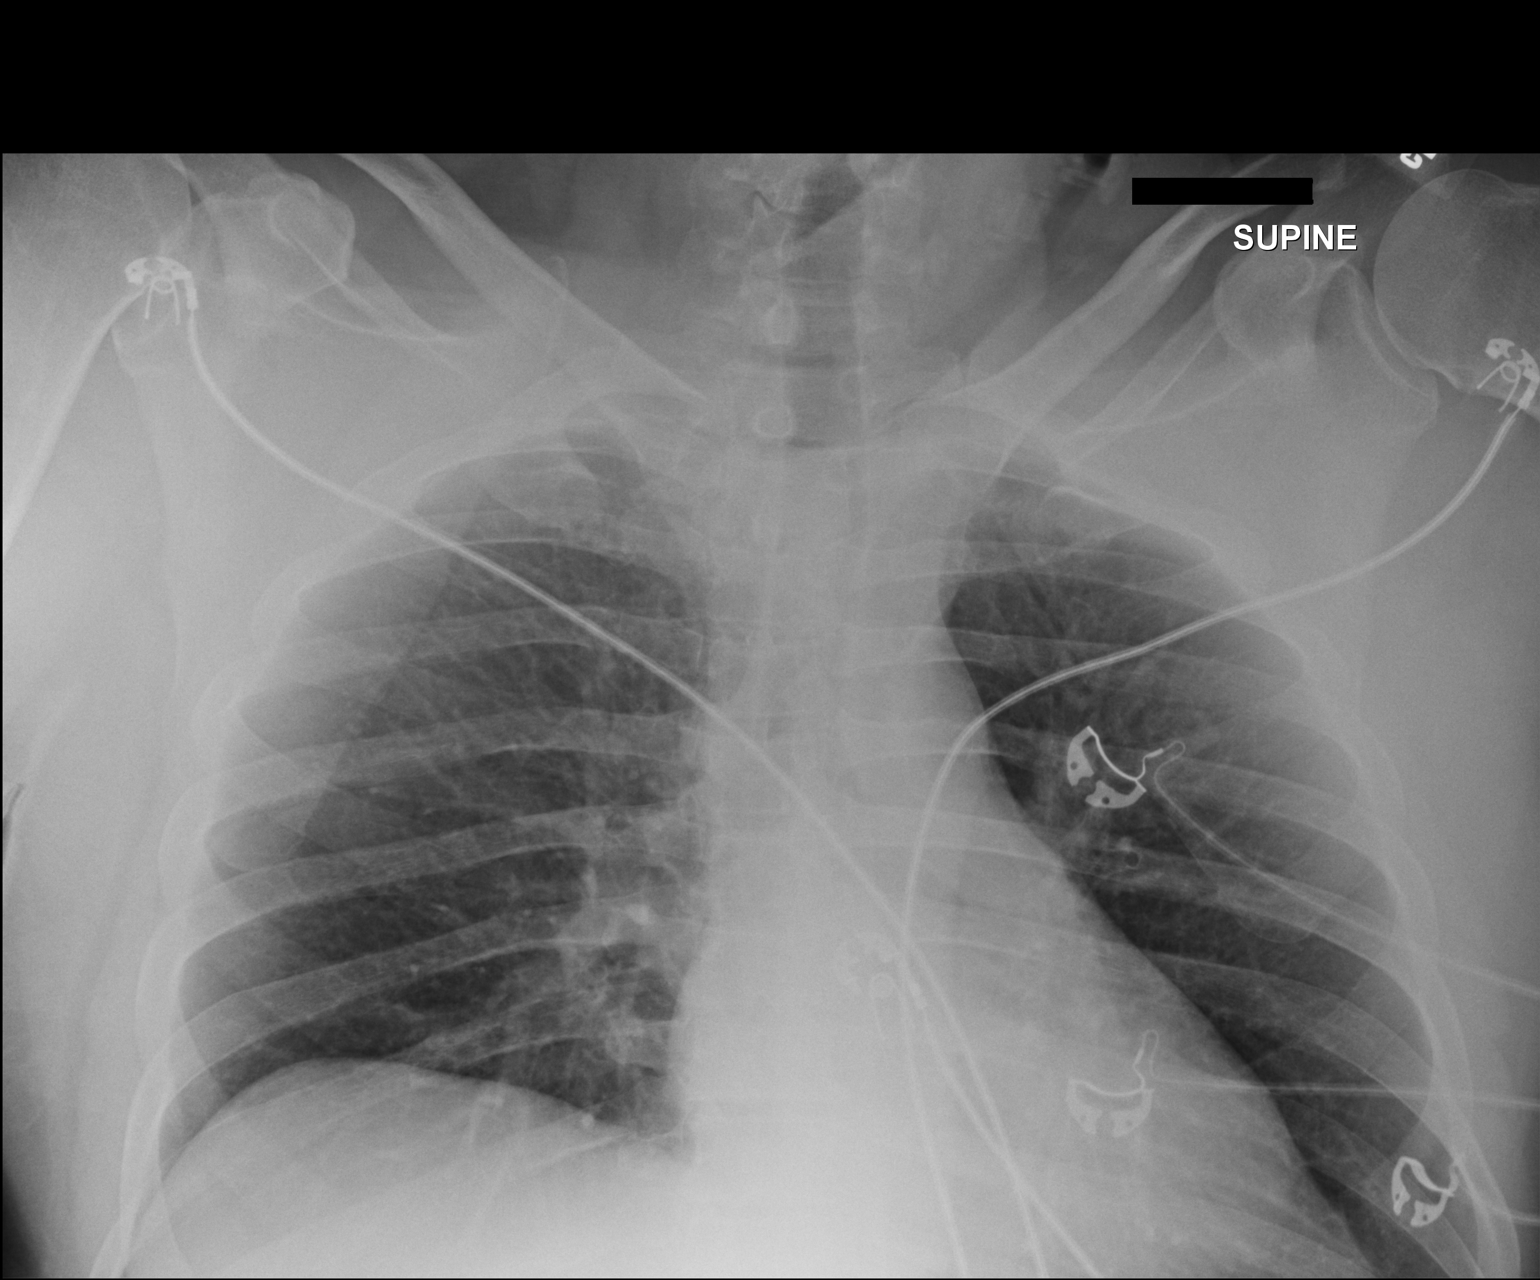

[AP (2 of 2)]
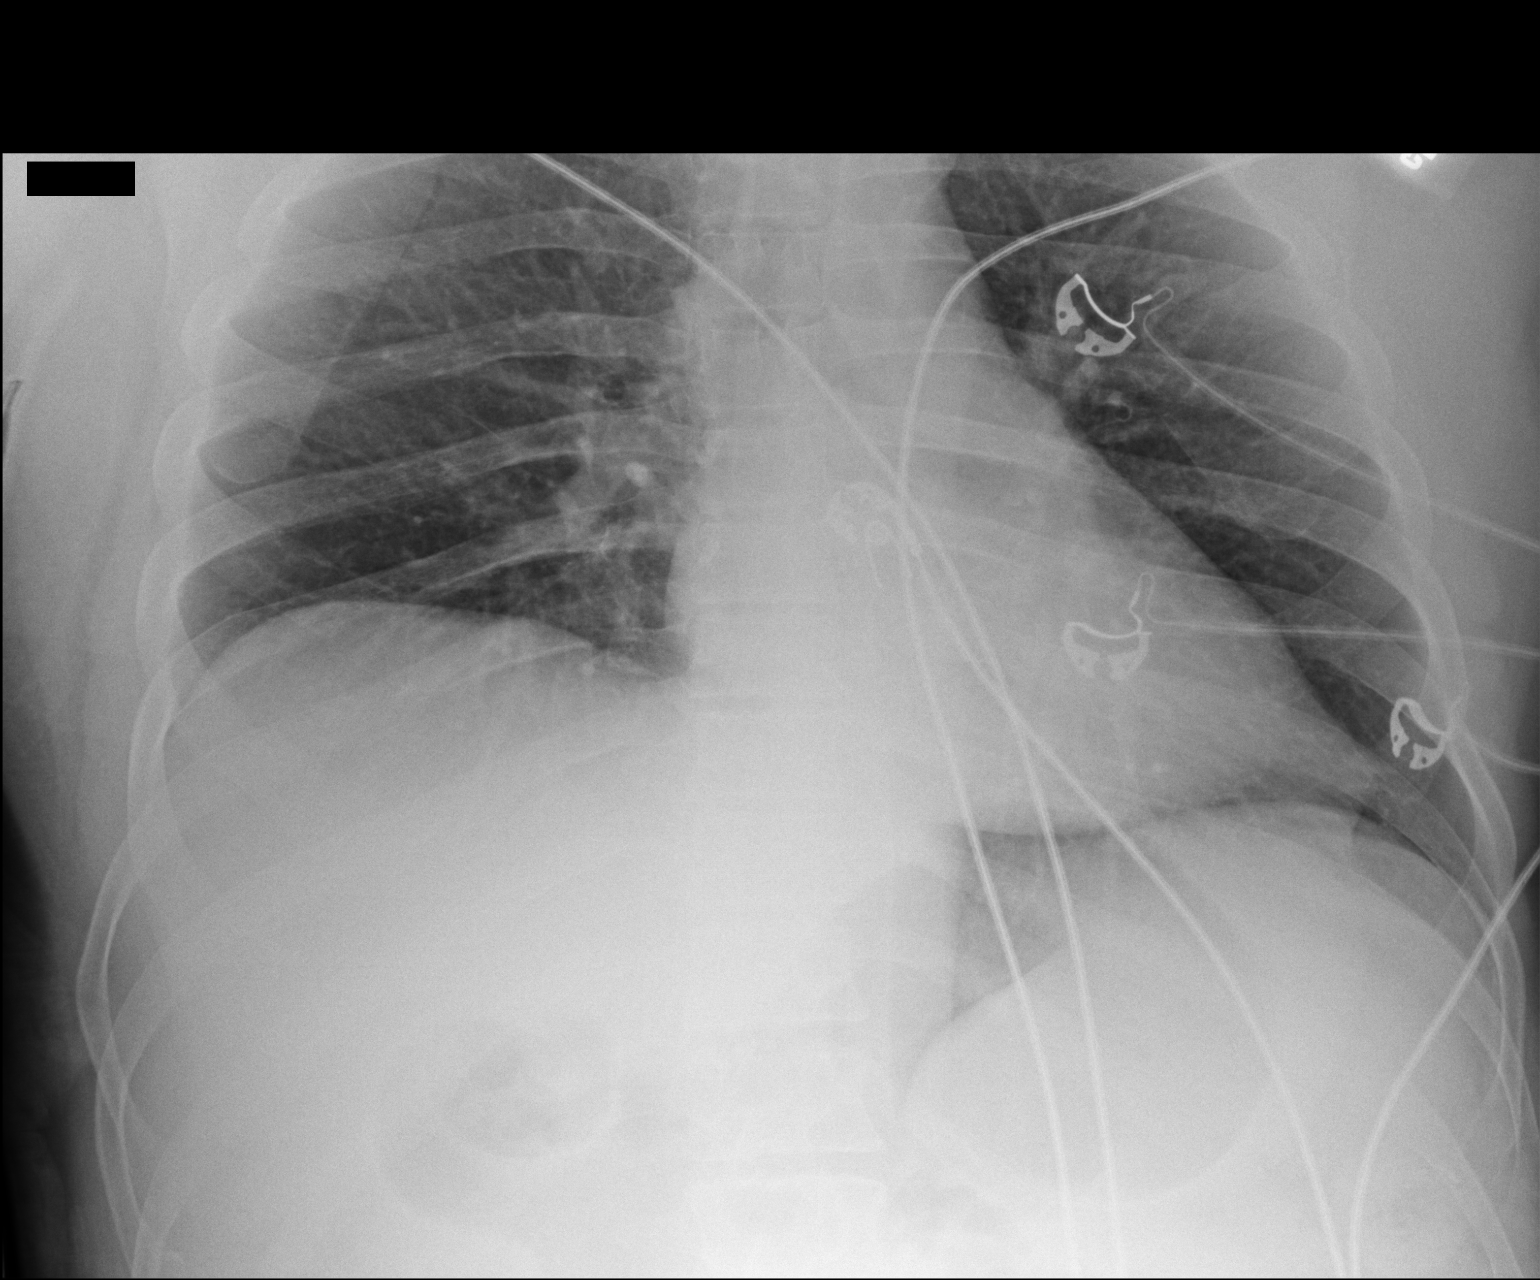

[2 of 2 positions shown; findings below may reference images not displayed]

FINDINGS: Lungs are clear. No pleural effusion or pneumothorax.

Cardiomediastinal silhouette is within normal limits.
IMPRESSION: No evidence of acute cardiopulmonary disease.

## 2014-04-21 ENCOUNTER — Other Ambulatory Visit: Payer: Self-pay | Admitting: Family Medicine

## 2014-05-15 ENCOUNTER — Other Ambulatory Visit: Payer: Self-pay | Admitting: Family Medicine

## 2014-06-06 ENCOUNTER — Other Ambulatory Visit: Payer: Self-pay | Admitting: Family Medicine

## 2014-06-06 NOTE — Telephone Encounter (Signed)
Last seen 02/25/14 DWM  Last VIt D level 10/16 30.4 Normal

## 2014-07-11 ENCOUNTER — Ambulatory Visit (INDEPENDENT_AMBULATORY_CARE_PROVIDER_SITE_OTHER): Payer: 59 | Admitting: Family Medicine

## 2014-07-11 ENCOUNTER — Encounter: Payer: Self-pay | Admitting: Family Medicine

## 2014-07-11 VITALS — BP 143/85 | HR 72 | Temp 97.2°F | Ht 69.0 in | Wt 221.0 lb

## 2014-07-11 DIAGNOSIS — E785 Hyperlipidemia, unspecified: Secondary | ICD-10-CM | POA: Diagnosis not present

## 2014-07-11 DIAGNOSIS — E119 Type 2 diabetes mellitus without complications: Secondary | ICD-10-CM

## 2014-07-11 DIAGNOSIS — N400 Enlarged prostate without lower urinary tract symptoms: Secondary | ICD-10-CM | POA: Diagnosis not present

## 2014-07-11 DIAGNOSIS — I10 Essential (primary) hypertension: Secondary | ICD-10-CM

## 2014-07-11 DIAGNOSIS — E559 Vitamin D deficiency, unspecified: Secondary | ICD-10-CM

## 2014-07-11 LAB — POCT CBC
GRANULOCYTE PERCENT: 67.5 % (ref 37–80)
HEMATOCRIT: 47.4 % (ref 43.5–53.7)
HEMOGLOBIN: 14.8 g/dL (ref 14.1–18.1)
Lymph, poc: 1.7 (ref 0.6–3.4)
MCH, POC: 28.5 pg (ref 27–31.2)
MCHC: 31.1 g/dL — AB (ref 31.8–35.4)
MCV: 91.7 fL (ref 80–97)
MPV: 10.7 fL (ref 0–99.8)
POC GRANULOCYTE: 4.1 (ref 2–6.9)
POC LYMPH PERCENT: 28.8 %L (ref 10–50)
Platelet Count, POC: 192 10*3/uL (ref 142–424)
RBC: 5.17 M/uL (ref 4.69–6.13)
RDW, POC: 13.5 %
WBC: 6 10*3/uL (ref 4.6–10.2)

## 2014-07-11 LAB — POCT GLYCOSYLATED HEMOGLOBIN (HGB A1C): HEMOGLOBIN A1C: 7.6

## 2014-07-11 LAB — POCT UA - MICROALBUMIN: MICROALBUMIN (UR) POC: 20 mg/L

## 2014-07-11 NOTE — Patient Instructions (Addendum)
Medicare Annual Wellness Visit  [facility] and the medical providers at [facility] strive to bring you the best medical care.  In doing so we not only want to address your current medical conditions and concerns but also to detect new conditions early and prevent illness, disease and health-related problems.    Medicare offers a yearly Wellness Visit which allows our clinical staff to assess your need for preventative services including immunizations, lifestyle education, counseling to decrease risk of preventable diseases and screening for fall risk and other medical concerns.    This visit is provided free of charge (no copay) for all Medicare recipients. The clinical pharmacists at [facility] have begun to conduct these Wellness Visits which will also include a thorough review of all your medications.    As you primary medical provider recommend that you make an appointment for your Annual Wellness Visit if you have not done so already this year.  You may set up this appointment before you leave today or you may call back ([phone]) and schedule an appointment.  Please make sure when you call that you mention that you are scheduling your Annual Wellness Visit with the clinical pharmacist so that the appointment may be made for the proper length of time.     Continue current medications. Continue good therapeutic lifestyle changes which include good diet and exercise. Fall precautions discussed with patient. If an FOBT was given today- please return it to our front desk. If you are over 44 years old - you may need Prevnar 1 or the adult Pneumonia vaccine.  Flu Shots are still available at our office. If you still haven't had one please call to set up a nurse visit to get one.   After your visit with [place] today you will receive a survey in the mail or online from [employer] regarding your care with [place]. Please take a moment to  fill this out. Your feedback is very important to [place] as you can help [place] better understand your patient needs as well as improve your experience and satisfaction. WE CARE ABOUT YOU!!!    the patient is asked to check his blood sugars and blood pressures more regularly and record these and bring them to the next visit. He should return the FOBT He should continue to watch his diet as closely as possible and get as much exercise as possible. Use nasal saline frequently for nasal congestion

## 2014-07-11 NOTE — Progress Notes (Signed)
Subjective:    Patient ID: Roger Palmer, male    DOB: May 26, 1950, 64 y.o.   MRN: [id number]  HPI Pt here for follow up and management of chronic medical problems which includes diabetes, hypertension and hyperlipidemia. He is taking medications regularly. The patient is pleasant and cooperative. He is trying to watch his diet by drinking more water but he is not getting enough exercise. He does not check his blood sugars regularly he thinks that they're running an average of about 130. He does not check his blood pressures at home. As far as his injury where he fell out of a tree a couple of years ago he has his typical joint aches and pains and is considering getting surgery on his right elbow which she cannot fully extend since that accident. He is due for an FOBT and he will be asked to return that limits his checked. He will get lab work today and a urine microalbumin. He has gained 2 pounds of weight since his last visit. The patient is up-to-date on his eye exam.         Patient Active Problem List   Diagnosis Date Noted  . Hyperlipidemia 11/09/2013  . 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 High risk medication use 08/14/2012  . 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 Ileus 07/23/2012  . Acute kidney injury 07/20/2012  . Hypernatremia 07/20/2012  . Aspiration pneumonia 07/20/2012  . Acute respiratory failure following trauma and surgery 07/16/2012  . Acute blood loss anemia 07/14/2012  . Hypokalemia 07/14/2012  . Fall from tree 07/13/2012  . Hypercholesteremia   . Nephrolithiasis   . Hypertension   . Back pain   . Obesity   . BPH (benign prostatic hypertrophy)   . Gastritis   . Reflux   . Diabetes mellitus without complication   . Right acetabular fracture 07/11/2012  . Open dislocation of right elbow 07/11/2012  . Distal radius fracture, right 07/11/2012  . Fracture of iliac wing 07/11/2012  . Pubic ramus fracture 07/11/2012    Outpatient Encounter Prescriptions as of 07/11/2014  Medication Sig  . atorvastatin (LIPITOR) 40 MG tablet Take 1 tablet by mouth  daily  . fenofibrate 160 MG tablet Take 1 tablet (160 mg total) by mouth daily.  . finasteride (PROSCAR) 5 MG tablet Take 1 tablet by mouth  daily  . losartan-hydrochlorothiazide (HYZAAR) 100-12.5 MG per tablet Take 1 tablet by mouth daily.  Marland Kitchen omeprazole (PRILOSEC) 20 MG capsule Take 1 capsule by mouth  every day  . tamsulosin (FLOMAX) 0.4 MG CAPS capsule Take 1 capsule by mouth  every day  . Vitamin D, Ergocalciferol, (DRISDOL) 50000 UNITS CAPS capsule Take 1 capsule  by mouth  every 7 days.  . [DISCONTINUED] fenofibrate micronized (LOFIBRA) 134 MG capsule Take 1 capsule by mouth  daily before breakfast    Review of Systems  Constitutional: Negative.   HENT: Negative.   Eyes: Negative.   Respiratory: Negative.   Cardiovascular: Negative.   Gastrointestinal: Negative.   Endocrine: Negative.   Genitourinary: Negative.   Musculoskeletal: Negative.   Skin: Negative.   Allergic/Immunologic: Negative.   Neurological: Negative.   Hematological: Negative.   Psychiatric/Behavioral: Negative.        Objective:   Physical Exam  Constitutional: He is oriented to person, place, and time. He appears well-developed and well-nourished. No distress.  HENT:  Head: Normocephalic and atraumatic.  Right Ear: External ear normal.  Left Ear:  External ear normal.  Mouth/Throat: Oropharynx is clear and moist. No oropharyngeal exudate.  Nasal congestion and turbinate swelling bilaterally  Eyes: Conjunctivae and EOM are normal. Pupils are equal, round, and reactive to light. Right eye exhibits no discharge. Left eye exhibits no discharge. No scleral icterus.  Neck: Normal range of motion. Neck supple. No thyromegaly present.  Without bruits or adenopathy  Cardiovascular: Normal rate, regular rhythm, normal heart sounds and intact distal pulses.   No murmur heard. The  rhythm is regular at 72/m.  Pulmonary/Chest: Effort normal and breath sounds normal. No respiratory distress. He has no wheezes. He has no rales. He exhibits no tenderness.  Clear anteriorly and posteriorly  Abdominal: Soft. Bowel sounds are normal. He exhibits no mass. There is no tenderness. There is no rebound and no guarding.  No masses tenderness or organ enlargement  Musculoskeletal: Normal range of motion. He exhibits no edema or tenderness.  Lymphadenopathy:    He has no cervical adenopathy.  Neurological: He is alert and oriented to person, place, and time. He has normal reflexes. No cranial nerve deficit.  Skin: Skin is warm and dry. No rash noted. No erythema. No pallor.  Psychiatric: He has a normal mood and affect. His behavior is normal. Judgment and thought content normal.  Nursing note and vitals reviewed.  BP 143/85 mmHg  Pulse 72  Temp(Src) 97.2 F (36.2 C) (Oral)  Ht 5' 9" (1.753 m)  Wt 221 lb (100.245 kg)  BMI 32.62 kg/m2        Assessment & Plan:   1. Essential hypertension -Continue to monitor blood pressures at home and watch sodium intake and do all possible efforts at losing weight - POCT CBC - BMP8+EGFR - Hepatic function panel  2. Hyperlipidemia -Continue with current treatment of atorvastatin and fenofibrate and all possible efforts at weight loss - POCT CBC - NMR, lipoprofile  3. BPH (benign prostatic hypertrophy) -He is having no problems with this and he should continue with his Flomax - POCT CBC  4. Diabetes mellitus without complication -He needs to monitor his blood sugars more closely and bring these readings in for review at his next visit - POCT CBC - POCT glycosylated hemoglobin (Hb A1C) - POCT UA - Microalbumin  5. Vitamin D deficiency -Continue current treatment pending lab work results -CBC -Vitamin D level  Patient Instructions                       Medicare Annual Wellness Visit  [facility] and the medical providers at  [facility] strive to bring you the best medical care.  In doing so we not only want to address your current medical conditions and concerns but also to detect new conditions early and prevent illness, disease and health-related problems.    Medicare offers a yearly Wellness Visit which allows our clinical staff to assess your need for preventative services including immunizations, lifestyle education, counseling to decrease risk of preventable diseases and screening for fall risk and other medical concerns.    This visit is provided free of charge (no copay) for all Medicare recipients. The clinical pharmacists at [facility] have begun to conduct these Wellness Visits which will also include a thorough review of all your medications.    As you primary medical provider recommend that you make an appointment for your Annual Wellness Visit if you have not done so already this year.  You may set up this appointment  before you leave today or you may call back ([phone]) and schedule an appointment.  Please make sure when you call that you mention that you are scheduling your Annual Wellness Visit with the clinical pharmacist so that the appointment may be made for the proper length of time.     Continue current medications. Continue good therapeutic lifestyle changes which include good diet and exercise. Fall precautions discussed with patient. If an FOBT was given today- please return it to our front desk. If you are over 34 years old - you may need Prevnar 50 or the adult Pneumonia vaccine.  Flu Shots are still available at our office. If you still haven't had one please call to set up a nurse visit to get one.   After your visit with [place] today you will receive a survey in the mail or online from [employer] regarding your care with [place]. Please take a moment to fill this out. Your feedback is very important to [place] as you can help [place] better understand your  patient needs as well as improve your experience and satisfaction. WE CARE ABOUT YOU!!!    the patient is asked to check his blood sugars and blood pressures more regularly and record these and bring them to the next visit. He should return the FOBT He should continue to watch his diet as closely as possible and get as much exercise as possible. Use nasal saline frequently for nasal congestion    Arrie Senate MD

## 2014-07-12 LAB — BMP8+EGFR
BUN / CREAT RATIO: 15 (ref 10–22)
BUN: 17 mg/dL (ref 8–27)
CHLORIDE: 104 mmol/L (ref 97–108)
CO2: 23 mmol/L (ref 18–29)
Calcium: 9.8 mg/dL (ref 8.6–10.2)
Creatinine, Ser: 1.16 mg/dL (ref 0.76–1.27)
GFR calc Af Amer: 77 mL/min/{1.73_m2} (ref >59)
GFR calc non Af Amer: 66 mL/min/{1.73_m2} (ref >59)
Glucose: 154 mg/dL — ABNORMAL HIGH (ref 65–99)
Potassium: 4.3 mmol/L (ref 3.5–5.2)
SODIUM: 145 mmol/L — AB (ref 134–144)

## 2014-07-12 LAB — NMR, LIPOPROFILE
CHOLESTEROL: 149 mg/dL (ref 100–199)
HDL CHOLESTEROL BY NMR: 47 mg/dL (ref >39)
HDL Particle Number: 33.6 umol/L (ref >=30.5)
LDL PARTICLE NUMBER: 1134 nmol/L — AB (ref <1000)
LDL Size: 20.3 nm (ref >20.5)
LDL-C: 83 mg/dL (ref 0–99)
LP-IR Score: 53 — ABNORMAL HIGH (ref <=45)
Small LDL Particle Number: 652 nmol/L — ABNORMAL HIGH (ref <=527)
TRIGLYCERIDES BY NMR: 95 mg/dL (ref 0–149)

## 2014-07-12 LAB — VITAMIN D 25 HYDROXY (VIT D DEFICIENCY, FRACTURES): Vit D, 25-Hydroxy: 33.9 ng/mL (ref 30.0–100.0)

## 2014-07-12 LAB — HEPATIC FUNCTION PANEL
ALT: 24 IU/L (ref 0–44)
AST: 24 IU/L (ref 0–40)
Albumin: 4.4 g/dL (ref 3.6–4.8)
Alkaline Phosphatase: 56 IU/L (ref 39–117)
Bilirubin Total: 0.8 mg/dL (ref 0.0–1.2)
Bilirubin, Direct: 0.18 mg/dL (ref 0.00–0.40)
Total Protein: 7 g/dL (ref 6.0–8.5)

## 2014-07-13 ENCOUNTER — Telehealth: Payer: Self-pay | Admitting: *Deleted

## 2014-07-13 MED ORDER — VITAMIN D (ERGOCALCIFEROL) 1.25 MG (50000 UNIT) PO CAPS: ORAL_CAPSULE | ORAL | Status: DC

## 2014-07-13 NOTE — Telephone Encounter (Signed)
-----   Message from Chipper Herb, MD sent at 07/12/2014  8:16 AM EST ----- The blood sugar is elevated at 154 and this is too high fasting. The patient must do better with his eating habits and exercise. The creatinine is good at 1.16. The electrolytes including potassium are good and within normal limits except the sodium is slightly elevated and this is consistent with past readings. Cholesterol numbers with advanced lipid testing have a total LDL particle number that is elevated at 1134 and this is about the same as it was 4 months ago. The triglycerides are good. The LDL C is good. The patient should continue with his atorvastatin and fenofibrate and once again must try to do better with his diet and exercise. All liver function tests are within normal limits. The vitamin D level is 33.9.------ this is at the low end of the normal range. He should continue with the vitamin D 50,000 units once weekly indefinitely.

## 2014-07-13 NOTE — Telephone Encounter (Signed)
Pt's wife notified of results Verbalizes understanding Rx for Vit D sent into pharmacy

## 2014-08-02 ENCOUNTER — Other Ambulatory Visit: Payer: Self-pay | Admitting: Family Medicine

## 2014-08-13 ENCOUNTER — Other Ambulatory Visit: Payer: Self-pay | Admitting: Family Medicine

## 2014-09-09 ENCOUNTER — Other Ambulatory Visit: Payer: Self-pay | Admitting: Family Medicine

## 2014-10-06 ENCOUNTER — Other Ambulatory Visit: Payer: Self-pay | Admitting: Family Medicine

## 2014-11-09 ENCOUNTER — Ambulatory Visit: Payer: 59 | Admitting: Family Medicine

## 2014-12-12 ENCOUNTER — Other Ambulatory Visit: Payer: Self-pay | Admitting: Family Medicine

## 2014-12-13 NOTE — Telephone Encounter (Signed)
Last seen 07/11/14  Last lipid and Vit D level 07/11/14  VIT D  33.9

## 2015-01-06 ENCOUNTER — Ambulatory Visit (INDEPENDENT_AMBULATORY_CARE_PROVIDER_SITE_OTHER): Payer: 59

## 2015-01-06 ENCOUNTER — Encounter: Payer: Self-pay | Admitting: Family Medicine

## 2015-01-06 ENCOUNTER — Ambulatory Visit (INDEPENDENT_AMBULATORY_CARE_PROVIDER_SITE_OTHER): Payer: 59 | Admitting: Family Medicine

## 2015-01-06 VITALS — BP 132/83 | HR 73 | Temp 96.9°F | Ht 69.0 in | Wt 217.0 lb

## 2015-01-06 DIAGNOSIS — E785 Hyperlipidemia, unspecified: Secondary | ICD-10-CM

## 2015-01-06 DIAGNOSIS — N400 Enlarged prostate without lower urinary tract symptoms: Secondary | ICD-10-CM | POA: Diagnosis not present

## 2015-01-06 DIAGNOSIS — I10 Essential (primary) hypertension: Secondary | ICD-10-CM

## 2015-01-06 DIAGNOSIS — E559 Vitamin D deficiency, unspecified: Secondary | ICD-10-CM | POA: Diagnosis not present

## 2015-01-06 DIAGNOSIS — R195 Other fecal abnormalities: Secondary | ICD-10-CM

## 2015-01-06 DIAGNOSIS — R197 Diarrhea, unspecified: Secondary | ICD-10-CM | POA: Diagnosis not present

## 2015-01-06 DIAGNOSIS — E119 Type 2 diabetes mellitus without complications: Secondary | ICD-10-CM | POA: Diagnosis not present

## 2015-01-06 DIAGNOSIS — R748 Abnormal levels of other serum enzymes: Secondary | ICD-10-CM

## 2015-01-06 DIAGNOSIS — Z1212 Encounter for screening for malignant neoplasm of rectum: Secondary | ICD-10-CM | POA: Diagnosis not present

## 2015-01-06 DIAGNOSIS — R7989 Other specified abnormal findings of blood chemistry: Secondary | ICD-10-CM

## 2015-01-06 LAB — POCT GLYCOSYLATED HEMOGLOBIN (HGB A1C): Hemoglobin A1C: 8

## 2015-01-06 MED ORDER — FENOFIBRIC ACID 105 MG PO TABS: 1.0000 | ORAL_TABLET | Freq: Every day | ORAL | Status: DC

## 2015-01-06 NOTE — Addendum Note (Signed)
Addended by: Earlene Plater on: 01/06/2015 02:18 PM   Modules accepted: Orders

## 2015-01-06 NOTE — Progress Notes (Signed)
Subjective:    Patient ID: Roger Palmer, male    DOB: 08/09/1950, 63 y.o.   MRN: [id number]  HPI Pt here for follow up and management of chronic medical problems which includes hypertension, hyperlipidemia, and diabetes. He is taking medications regularly. The patient does complain of some loose stools over the past month. He brings in blood sugars for review and they are running anywhere from 117 in the morning to his high as 180 2 in the morning. The home blood pressures are good.      Patient Active Problem List   Diagnosis Date Noted  . Hyperlipidemia 11/09/2013  . 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 High risk medication use 08/14/2012  . 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 Ileus 07/23/2012  . Acute kidney injury 07/20/2012  . Hypernatremia 07/20/2012  . Aspiration pneumonia 07/20/2012  . Acute respiratory failure following trauma and surgery 07/16/2012  . Acute blood loss anemia 07/14/2012  . Hypokalemia 07/14/2012  . Fall from tree 07/13/2012  . Hypercholesteremia   . Nephrolithiasis   . Hypertension   . Back pain   . Obesity   . BPH (benign prostatic hypertrophy)   . Gastritis   . Reflux   . Diabetes mellitus without complication   . Right acetabular fracture 07/11/2012  . Open dislocation of right elbow 07/11/2012  . Distal radius fracture, right 07/11/2012  . Fracture of iliac wing 07/11/2012  . Pubic ramus fracture 07/11/2012   Outpatient Encounter Prescriptions as of 01/06/2015  Medication Sig  . atorvastatin (LIPITOR) 40 MG tablet Take 1 tablet by mouth  daily  . fenofibrate 160 MG tablet Take 1 tablet by mouth  daily  . finasteride (PROSCAR) 5 MG tablet Take 1 tablet by mouth  daily  . losartan-hydrochlorothiazide (HYZAAR) 100-12.5 MG per tablet Take 1 tablet by mouth  daily  . omeprazole (PRILOSEC) 20 MG capsule Take 1 capsule by mouth  every day  . tamsulosin (FLOMAX) 0.4 MG CAPS capsule Take 1 capsule by  mouth  every day  . Vitamin D, Ergocalciferol, (DRISDOL) 50000 UNITS CAPS capsule Take 1 capsule by mouth  every 7 days   No facility-administered encounter medications on file as of 01/06/2015.      Review of Systems  Constitutional: Negative.   HENT: Negative.   Eyes: Negative.   Respiratory: Negative.   Cardiovascular: Negative.   Gastrointestinal: Positive for diarrhea (loose stool over last month).  Endocrine: Negative.   Genitourinary: Negative.   Musculoskeletal: Negative.   Skin: Negative.   Allergic/Immunologic: Negative.   Neurological: Negative.   Hematological: Negative.   Psychiatric/Behavioral: Negative.        Objective:   Physical Exam  Constitutional: He is oriented to person, place, and time. He appears well-developed and well-nourished. No distress.  HENT:  Head: Normocephalic and atraumatic.  Right Ear: External ear normal.  Left Ear: External ear normal.  Mouth/Throat: Oropharynx is clear and moist. No oropharyngeal exudate.  Nasal congestion bilaterally  Eyes: Conjunctivae and EOM are normal. Pupils are equal, round, and reactive to light. Right eye exhibits no discharge. Left eye exhibits no discharge. No scleral icterus.  Neck: Normal range of motion. Neck supple. No thyromegaly present.  Neck without bruits or thyromegaly  Cardiovascular: Normal rate, regular rhythm, normal heart sounds and intact distal pulses.   No murmur heard. The rhythm is regular at 72/m  Pulmonary/Chest: Effort normal and breath sounds normal. No respiratory distress. He has  no wheezes. He has no rales. He exhibits no tenderness.  Clear anteriorly and posteriorly  Abdominal: Soft. Bowel sounds are normal. He exhibits no mass. There is no tenderness. There is no rebound and no guarding.  No abdominal tenderness or organ enlargement or inguinal adenopathy  Musculoskeletal: He exhibits no edema or tenderness.  Limited range of motion of right upper extremity secondary to trauma   Lymphadenopathy:    He has no cervical adenopathy.  Neurological: He is alert and oriented to person, place, and time. He has normal reflexes. No cranial nerve deficit.  Skin: Skin is warm and dry. No rash noted. No erythema. No pallor.  Psychiatric: He has a normal mood and affect. His behavior is normal. Judgment and thought content normal.  Nursing note and vitals reviewed.  BP 132/83 mmHg  Pulse 73  Temp(Src) 96.9 F (36.1 C) (Oral)  Ht $R'5\' 9"'XL$  (1.753 m)  Wt 217 lb (98.431 kg)  BMI 32.03 kg/m2  [facility] reading (PRIMARY) by  DrMoore-chest x-ray-                                        Assessment & Plan:  1. Hyperlipidemia -Continue current treatment pending results of lab work - CBC with Differential/Platelet - Lipid panel - DG Chest 2 View; Future  2. Essential hypertension -Blood pressure is good today patient should continue with current treatment - BMP8+EGFR - CBC with Differential/Platelet - Hepatic function panel - DG Chest 2 View; Future  3. BPH (benign prostatic hypertrophy) -He is having no symptoms with his BPH and this exam will not be done until the next visit - CBC with Differential/Platelet  4. Diabetes mellitus without complication -Blood sugars have been running high. The patient admits to not following his diet as closely and not exercising -Hemoglobin A1c was 8.0% today. -He'll be scheduled for a visit with the clinical pharmacists and diabetic educator to get better blood sugar control - POCT glycosylated hemoglobin (Hb A1C) - CBC with Differential/Platelet  5. Vitamin D deficiency -Continue current treatment pending results of lab work - CBC with Differential/Platelet - Vit D  25 hydroxy (rtn osteoporosis monitoring)  6. Loose bowel movements -Leave off caffeine. -If this does not help with a loose bowel movements start a probiotic like Align  Meds ordered this encounter  Medications  . Fenofibric Acid 105 MG TABS    Sig: Take 1 tablet (105  mg total) by mouth daily.    Dispense:  30 tablet    Refill:  6   Patient Instructions  Patient needs to leave the caffeine out of his diet He should try to get more exercise and drink more water He should try align a probiotic over-the-counter to see if this helps with the loose bowel movements if the removal of the caffeine does not work. He should call us in 2 weeks with his progress He should monitor his blood pressures and blood sugars more frequently Because of the elevated A1c will be scheduled for a visit to see the clinical pharmacist to get his blood sugar under better control Because of some additional heartburn and indigestion he will be asked to take a Zantac or ranitidine before dinner and continues to take the proton pump inhibitor before breakfast The patient's next colonoscopy will be due in early 2017.   Arrie Senate MD

## 2015-01-06 NOTE — Patient Instructions (Addendum)
Patient needs to leave the caffeine out of his diet He should try to get more exercise and drink more water He should try align a probiotic over-the-counter to see if this helps with the loose bowel movements if the removal of the caffeine does not work. He should call us in 2 weeks with his progress He should monitor his blood pressures and blood sugars more frequently Because of the elevated A1c will be scheduled for a visit to see the clinical pharmacist to get his blood sugar under better control Because of some additional heartburn and indigestion he will be asked to take a Zantac or ranitidine before dinner and continues to take the proton pump inhibitor before breakfast The patient's next colonoscopy will be due in early 2017.

## 2015-01-07 LAB — BMP8+EGFR
BUN/Creatinine Ratio: 11 (ref 10–22)
BUN: 15 mg/dL (ref 8–27)
CO2: 24 mmol/L (ref 18–29)
CREATININE: 1.33 mg/dL — AB (ref 0.76–1.27)
Calcium: 9.7 mg/dL (ref 8.6–10.2)
Chloride: 102 mmol/L (ref 97–108)
GFR calc Af Amer: 64 mL/min/{1.73_m2} (ref >59)
GFR, EST NON AFRICAN AMERICAN: 56 mL/min/{1.73_m2} — AB (ref >59)
Glucose: 134 mg/dL — ABNORMAL HIGH (ref 65–99)
POTASSIUM: 3.8 mmol/L (ref 3.5–5.2)
SODIUM: 141 mmol/L (ref 134–144)

## 2015-01-07 LAB — CBC WITH DIFFERENTIAL/PLATELET
Basophils Absolute: 0.1 10*3/uL (ref 0.0–0.2)
Basos: 1 %
EOS (ABSOLUTE): 0.1 10*3/uL (ref 0.0–0.4)
Eos: 1 %
Hematocrit: 40.8 % (ref 37.5–51.0)
Hemoglobin: 14.1 g/dL (ref 12.6–17.7)
IMMATURE GRANS (ABS): 0 10*3/uL (ref 0.0–0.1)
IMMATURE GRANULOCYTES: 0 %
LYMPHS: 25 %
Lymphocytes Absolute: 2 10*3/uL (ref 0.7–3.1)
MCH: 30.7 pg (ref 26.6–33.0)
MCHC: 34.6 g/dL (ref 31.5–35.7)
MCV: 89 fL (ref 79–97)
MONOS ABS: 0.8 10*3/uL (ref 0.1–0.9)
Monocytes: 9 %
NEUTROS PCT: 64 %
Neutrophils Absolute: 5.3 10*3/uL (ref 1.4–7.0)
PLATELETS: 246 10*3/uL (ref 150–379)
RBC: 4.6 x10E6/uL (ref 4.14–5.80)
RDW: 13.7 % (ref 12.3–15.4)
WBC: 8.3 10*3/uL (ref 3.4–10.8)

## 2015-01-07 LAB — FECAL OCCULT BLOOD, IMMUNOCHEMICAL: Fecal Occult Bld: POSITIVE — AB

## 2015-01-07 LAB — HEPATIC FUNCTION PANEL
ALBUMIN: 4.3 g/dL (ref 3.6–4.8)
ALK PHOS: 53 IU/L (ref 39–117)
ALT: 21 IU/L (ref 0–44)
AST: 19 IU/L (ref 0–40)
BILIRUBIN TOTAL: 0.7 mg/dL (ref 0.0–1.2)
Bilirubin, Direct: 0.19 mg/dL (ref 0.00–0.40)
Total Protein: 7 g/dL (ref 6.0–8.5)

## 2015-01-07 LAB — VITAMIN D 25 HYDROXY (VIT D DEFICIENCY, FRACTURES): VIT D 25 HYDROXY: 41.4 ng/mL (ref 30.0–100.0)

## 2015-01-07 LAB — LIPID PANEL
CHOL/HDL RATIO: 2.9 ratio (ref 0.0–5.0)
Cholesterol, Total: 123 mg/dL (ref 100–199)
HDL: 43 mg/dL (ref >39)
LDL Calculated: 64 mg/dL (ref 0–99)
TRIGLYCERIDES: 81 mg/dL (ref 0–149)
VLDL Cholesterol Cal: 16 mg/dL (ref 5–40)

## 2015-01-07 NOTE — Addendum Note (Signed)
Addended by: Ilean China on: 01/07/2015 11:53 AM   Modules accepted: Orders

## 2015-01-17 ENCOUNTER — Other Ambulatory Visit (INDEPENDENT_AMBULATORY_CARE_PROVIDER_SITE_OTHER): Payer: 59

## 2015-01-17 ENCOUNTER — Other Ambulatory Visit: Payer: 59

## 2015-01-17 DIAGNOSIS — Z1212 Encounter for screening for malignant neoplasm of rectum: Secondary | ICD-10-CM

## 2015-01-17 DIAGNOSIS — D62 Acute posthemorrhagic anemia: Secondary | ICD-10-CM

## 2015-01-17 NOTE — Progress Notes (Signed)
Lab only 

## 2015-01-18 LAB — CBC WITH DIFFERENTIAL/PLATELET
Basophils Absolute: 0.1 10*3/uL (ref 0.0–0.2)
Basos: 1 %
EOS (ABSOLUTE): 0.1 10*3/uL (ref 0.0–0.4)
Eos: 2 %
Hematocrit: 39.4 % (ref 37.5–51.0)
Hemoglobin: 13.3 g/dL (ref 12.6–17.7)
IMMATURE GRANULOCYTES: 0 %
Immature Grans (Abs): 0 10*3/uL (ref 0.0–0.1)
LYMPHS: 28 %
Lymphocytes Absolute: 1.6 10*3/uL (ref 0.7–3.1)
MCH: 30.3 pg (ref 26.6–33.0)
MCHC: 33.8 g/dL (ref 31.5–35.7)
MCV: 90 fL (ref 79–97)
MONOS ABS: 0.4 10*3/uL (ref 0.1–0.9)
Monocytes: 7 %
NEUTROS PCT: 62 %
Neutrophils Absolute: 3.6 10*3/uL (ref 1.4–7.0)
PLATELETS: 241 10*3/uL (ref 150–379)
RBC: 4.39 x10E6/uL (ref 4.14–5.80)
RDW: 13.9 % (ref 12.3–15.4)
WBC: 5.8 10*3/uL (ref 3.4–10.8)

## 2015-01-19 ENCOUNTER — Encounter: Payer: Self-pay | Admitting: Pharmacist

## 2015-01-19 ENCOUNTER — Ambulatory Visit (INDEPENDENT_AMBULATORY_CARE_PROVIDER_SITE_OTHER): Payer: 59 | Admitting: Pharmacist

## 2015-01-19 VITALS — BP 130/81 | HR 74 | Ht 69.0 in | Wt 215.0 lb

## 2015-01-19 DIAGNOSIS — E119 Type 2 diabetes mellitus without complications: Secondary | ICD-10-CM

## 2015-01-19 LAB — FECAL OCCULT BLOOD, IMMUNOCHEMICAL: FECAL OCCULT BLD: NEGATIVE

## 2015-01-19 MED ORDER — METFORMIN HCL ER 500 MG PO TB24: 500.0000 mg | ORAL_TABLET | Freq: Every day | ORAL | Status: DC

## 2015-01-19 NOTE — Patient Instructions (Signed)
Diabetes and Standards of Medical Care   Diabetes is complicated. You may find that your diabetes team includes a dietitian, nurse, diabetes educator, eye doctor, and more. To help everyone know what is going on and to help you get the care you deserve, the following schedule of care was developed to help keep you on track. Below are the tests, exams, vaccines, medicines, education, and plans you will need.  Blood Glucose Goals Prior to meals = 80 - 130 Within 2 hours of the start of a meal = less than 180  HbA1c test (goal is less than 7.0% - your last value was 8.0%) This test shows how well you have controlled your glucose over the past 2 to 3 months. It is used to see if your diabetes management plan needs to be adjusted.   It is performed at least 2 times a year if you are meeting treatment goals.  It is performed 4 times a year if therapy has changed or if you are not meeting treatment goals.  Blood pressure test  This test is performed at every routine medical visit. The goal is less than 140/90 mmHg for most people, but 130/80 mmHg in some cases. Ask your health care provider about your goal.  Dental exam  Follow up with the dentist regularly.  Eye exam  If you are diagnosed with type 1 diabetes as a child, get an exam upon reaching the age of 79 years or older and have had diabetes for 3 to 5 years. Yearly eye exams are recommended after that initial eye exam.  If you are diagnosed with type 1 diabetes as an adult, get an exam within 5 years of diagnosis and then yearly.  If you are diagnosed with type 2 diabetes, get an exam as soon as possible after the diagnosis and then yearly.  Foot care exam  Visual foot exams are performed at every routine medical visit. The exams check for cuts, injuries, or other problems with the feet.  A comprehensive foot exam should be done yearly. This includes visual inspection as well as assessing foot pulses and testing for loss of  sensation.  Check your feet nightly for cuts, injuries, or other problems with your feet. Tell your health care provider if anything is not healing.  Kidney function test (urine microalbumin)  This test is performed once a year.  Type 1 diabetes: The first test is performed 5 years after diagnosis.  Type 2 diabetes: The first test is performed at the time of diagnosis.  A serum creatinine and estimated glomerular filtration rate (eGFR) test is done once a year to assess the level of chronic kidney disease (CKD), if present.  Lipid profile (cholesterol, HDL, LDL, triglycerides)  Performed every 5 years for most people.  The goal for LDL is less than 100 mg/dL. If you are at high risk, the goal is less than 70 mg/dL.  The goal for HDL is 40 mg/dL to 50 mg/dL for men and 50 mg/dL to 60 mg/dL for women. An HDL cholesterol of 60 mg/dL or higher gives some protection against heart disease.  The goal for triglycerides is less than 150 mg/dL.  Influenza vaccine, pneumococcal vaccine, and hepatitis B vaccine  The influenza vaccine is recommended yearly.  The pneumococcal vaccine is generally given once in a lifetime. However, there are some instances when another vaccination is recommended. Check with your health care provider.  The hepatitis B vaccine is also recommended for adults with diabetes.  Diabetes self-management education  Education is recommended at diagnosis and ongoing as needed.  Treatment plan  Your treatment plan is reviewed at every medical visit.  Document Released: 02/24/2009 Document Revised: 12/30/2012 Document Reviewed: 09/29/2012 [employer] Patient Information 2014 [employer]   

## 2015-01-19 NOTE — Progress Notes (Signed)
Subjective:    Roger Palmer is a 64 y.o. male who presents for an initial evaluation of Type 2 diabetes mellitus.  Patient was diagnoses several years ago and has controlled with diet and metformin, however in 2014 he stopped metformin because he felt he did not need anymore and slowly over the last 1-2 years his A1c has steadily increased.  He also reports to following any particular diet.  Current symptoms/problems include hyperglycemia and have been worsening. Symptoms have been present for 6 months.  Known diabetic complications: nephropathy Cardiovascular risk factors: advanced age (older than 36 for men, 44 for women), diabetes mellitus, dyslipidemia, family history of premature cardiovascular disease, hypertension, male gender, obesity (BMI >= 30 kg/m2) and sedentary lifestyle Current diabetic medications include none.   Eye exam current (within one year): yes Weight trend: decreasing steadily Prior visit with dietician: no Current diet: in general, an "unhealthy" diet Current exercise: restarted walking over the last 7 days.   Current monitoring regimen: home blood tests - checks BG about every other day Home blood sugar records: ranges from 144 to 181 (am fasting readings) Any episodes of hypoglycemia? no  Is He on ACE inhibitor or angiotensin II receptor blocker?  Yes taking ARB - losartan HCTZ {  Objective:    BP 130/81 mmHg  Pulse 74  Ht 5\' 9"  (1.753 m)  Wt 215 lb (97.523 kg)  BMI 31.74 kg/m2  A1c = 8.0% (01/06/2015) Urine Microalbumin was WNL (07-11-2014)  Lab Review GLUCOSE (mg/dL)  Date Value  01/06/2015 134*  07/11/2014 154*  02/25/2014 140*   GLUCOSE, BLD (mg/dL)  Date Value  11/11/2012 121*  08/14/2012 126*  08/06/2012 126*   CO2 (mmol/L)  Date Value  01/06/2015 24  07/11/2014 23  02/25/2014 24   BUN (mg/dL)  Date Value  01/06/2015 15  07/11/2014 17  02/25/2014 15  11/11/2012 13  08/14/2012 18  08/06/2012 19   CREAT (mg/dL)  Date  Value  11/11/2012 1.25  08/14/2012 1.30   CREATININE, SER (mg/dL)  Date Value  01/06/2015 1.33*  07/11/2014 1.16  02/25/2014 1.22    Assessment:    Diabetes Mellitus type II, under inadequate control.    Plan:    1.  Rx changes: restart metformin XR 500mg  - take 1 tablet qam with food.  Spent 10 minutes discussing te benefits of metformin even when BG is controlled.  2.  Education: Reviewed 'ABCs' of diabetes management (respective goals in parentheses):  A1C (<7), blood pressure (<130/80), and cholesterol (LDL <100). 3.  Compliance at present is estimated to be good. Efforts to improve compliance (if necessary) will be directed:   dietary modifications: discussed CHO counting and serving size recommendations in depth with patient (face to face education for about 30 minutes just reviewing nutrition),   increased exercise - recommend exercise 4 to 5 days per week - goal of at least 150 minutes per week.    regular blood sugar monitoring: check qd to qod at varying times of day 4. Follow up: 3 months    Cherre Robins, PharmD, CPP, CDE

## 2015-02-15 ENCOUNTER — Other Ambulatory Visit: Payer: Self-pay | Admitting: Family Medicine

## 2015-02-28 ENCOUNTER — Encounter: Payer: Self-pay | Admitting: Family Medicine

## 2015-03-14 ENCOUNTER — Other Ambulatory Visit: Payer: Self-pay | Admitting: Family Medicine

## 2015-04-04 ENCOUNTER — Other Ambulatory Visit: Payer: Self-pay | Admitting: Family Medicine

## 2015-04-10 ENCOUNTER — Ambulatory Visit: Payer: Self-pay | Admitting: Pharmacist

## 2015-04-10 ENCOUNTER — Ambulatory Visit (INDEPENDENT_AMBULATORY_CARE_PROVIDER_SITE_OTHER): Payer: 59 | Admitting: Family

## 2015-04-10 ENCOUNTER — Encounter: Payer: Self-pay | Admitting: Family

## 2015-04-10 VITALS — BP 135/84 | HR 113 | Temp 97.0°F | Ht 69.0 in | Wt 216.0 lb

## 2015-04-10 DIAGNOSIS — S60411A Abrasion of left index finger, initial encounter: Secondary | ICD-10-CM

## 2015-04-10 DIAGNOSIS — S60419A Abrasion of unspecified finger, initial encounter: Secondary | ICD-10-CM

## 2015-04-10 NOTE — Progress Notes (Signed)
   Subjective:    Patient ID: Roger Palmer, male    DOB: [birth year], 64 y.o.   MRN: [id number]  Pt presents to the office today for a laceration on his left index finger. PT states that he was cooking and states he cut his finger on the "food processor blade". Pt states his finger has "stopped bleeding now", but just wanted to get it checked. Pt states his TDAP is current.  Laceration  The incident occurred 1 to 3 hours ago. The laceration is located on the left hand. The laceration mechanism was a clean knife. The pain is at a severity of 5/10. The pain is mild. The pain has been improving since onset. He reports no foreign bodies present.      Review of Systems  Constitutional: Negative.   HENT: Negative.   Respiratory: Negative.   Cardiovascular: Negative.   Gastrointestinal: Negative.   Endocrine: Negative.   Genitourinary: Negative.   Musculoskeletal: Negative.   Neurological: Negative.   Hematological: Negative.   Psychiatric/Behavioral: Negative.   All other systems reviewed and are negative.      Objective:   Physical Exam  Constitutional: He is oriented to person, place, and time. He appears well-developed and well-nourished. No distress.  HENT:  Head: Normocephalic.  Neck: Normal range of motion. Neck supple. No thyromegaly present.  Cardiovascular: Normal rate, regular rhythm, normal heart sounds and intact distal pulses.   No murmur heard. Pulmonary/Chest: Effort normal and breath sounds normal. No respiratory distress. He has no wheezes.  Abdominal: Soft. Bowel sounds are normal. He exhibits no distension. There is no tenderness.  Musculoskeletal: Normal range of motion. He exhibits no edema or tenderness.  Neurological: He is alert and oriented to person, place, and time. He has normal reflexes. No cranial nerve deficit.  Skin: Skin is warm and dry. Abrasion (on left index finger 1.2cmX1 cm) noted. No rash noted. No erythema.  Psychiatric: He has a normal mood  and affect. His behavior is normal. Judgment and thought content normal.  Vitals reviewed.  Abrasion on left index- Area cleaned with saline, and Monsel solution applied to area, and band-aid applied  BP 135/84 mmHg  Pulse 113  Temp(Src) 97 F (36.1 C) (Oral)  Ht 5\' 9"  (1.753 m)  Wt 216 lb (97.977 kg)  BMI 31.88 kg/m2       Assessment & Plan:  1. Abrasion of finger, initial encounter -Keep clean and dry -Apply antibacterial ointment BID -S/S of infection discussed -RTO prn   Evelina Dun, FNP

## 2015-04-10 NOTE — Patient Instructions (Signed)
Abrasion An abrasion is a cut or scrape on the outer surface of your skin. An abrasion does not extend through all of the layers of your skin. It is important to care for your abrasion properly to prevent infection. CAUSES Most abrasions are caused by falling on or gliding across the ground or another surface. When your skin rubs on something, the outer and inner layer of skin rubs off.  SYMPTOMS A cut or scrape is the main symptom of this condition. The scrape may be bleeding, or it may appear red or pink. If there was an associated fall, there may be an underlying bruise. DIAGNOSIS An abrasion is diagnosed with a physical exam. TREATMENT Treatment for this condition depends on how large and deep the abrasion is. Usually, your abrasion will be cleaned with water and mild soap. This removes any dirt or debris that may be stuck. An antibiotic ointment may be applied to the abrasion to help prevent infection. A bandage (dressing) may be placed on the abrasion to keep it clean. You may also need a tetanus shot. HOME CARE INSTRUCTIONS Medicines  Take or apply medicines only as directed by your health care provider.  If you were prescribed an antibiotic ointment, finish all of it even if you start to feel better. Wound Care  Clean the wound with mild soap and water 2-3 times per day or as directed by your health care provider. Pat your wound dry with a clean towel. Do not rub it.  There are many different ways to close and cover a wound. Follow instructions from your health care provider about:  Wound care.  Dressing changes and removal.  Check your wound every day for signs of infection. Watch for:  Redness, swelling, or pain.  Fluid, blood, or pus. General Instructions  Keep the dressing dry as directed by your health care provider. Do not take baths, swim, use a hot tub, or do anything that would put your wound underwater until your health care provider approves.  If there is  swelling, raise (elevate) the injured area above the level of your heart while you are sitting or lying down.  Keep all follow-up visits as directed by your health care provider. This is important. SEEK MEDICAL CARE IF:  You received a tetanus shot and you have swelling, severe pain, redness, or bleeding at the injection site.  Your pain is not controlled with medicine.  You have increased redness, swelling, or pain at the site of your wound. SEEK IMMEDIATE MEDICAL CARE IF:  You have a red streak going away from your wound.  You have a fever.  You have fluid, blood, or pus coming from your wound.  You notice a bad smell coming from your wound or your dressing.   This information is not intended to replace advice given to you by your health care provider. Make sure you discuss any questions you have with your health care provider.   Document Released: 02/06/2005 Document Revised: 01/18/2015 Document Reviewed: 04/27/2014 [employer] Interactive Patient Education 2016 [employer]  

## 2015-05-10 ENCOUNTER — Other Ambulatory Visit: Payer: Self-pay | Admitting: Family Medicine

## 2015-05-25 DIAGNOSIS — D225 Melanocytic nevi of trunk: Secondary | ICD-10-CM | POA: Diagnosis not present

## 2015-05-25 DIAGNOSIS — L821 Other seborrheic keratosis: Secondary | ICD-10-CM | POA: Diagnosis not present

## 2015-05-25 DIAGNOSIS — D2271 Melanocytic nevi of right lower limb, including hip: Secondary | ICD-10-CM | POA: Diagnosis not present

## 2015-05-25 DIAGNOSIS — Z85828 Personal history of other malignant neoplasm of skin: Secondary | ICD-10-CM | POA: Diagnosis not present

## 2015-05-25 DIAGNOSIS — L570 Actinic keratosis: Secondary | ICD-10-CM | POA: Diagnosis not present

## 2015-06-03 ENCOUNTER — Other Ambulatory Visit: Payer: Self-pay | Admitting: Family Medicine

## 2015-06-23 ENCOUNTER — Other Ambulatory Visit: Payer: Self-pay | Admitting: Family Medicine

## 2015-06-23 MED ORDER — LOSARTAN POTASSIUM-HCTZ 100-12.5 MG PO TABS: ORAL_TABLET | ORAL | Status: DC

## 2015-06-23 NOTE — Telephone Encounter (Signed)
done

## 2015-06-28 ENCOUNTER — Ambulatory Visit (INDEPENDENT_AMBULATORY_CARE_PROVIDER_SITE_OTHER): Payer: Medicare Other | Admitting: Family Medicine

## 2015-06-28 ENCOUNTER — Encounter: Payer: Self-pay | Admitting: Family Medicine

## 2015-06-28 VITALS — BP 117/67 | HR 72 | Temp 97.6°F | Ht 69.0 in | Wt 213.0 lb

## 2015-06-28 DIAGNOSIS — E669 Obesity, unspecified: Secondary | ICD-10-CM

## 2015-06-28 DIAGNOSIS — Z23 Encounter for immunization: Secondary | ICD-10-CM

## 2015-06-28 DIAGNOSIS — K219 Gastro-esophageal reflux disease without esophagitis: Secondary | ICD-10-CM | POA: Diagnosis not present

## 2015-06-28 DIAGNOSIS — N400 Enlarged prostate without lower urinary tract symptoms: Secondary | ICD-10-CM | POA: Diagnosis not present

## 2015-06-28 DIAGNOSIS — I10 Essential (primary) hypertension: Secondary | ICD-10-CM | POA: Diagnosis not present

## 2015-06-28 DIAGNOSIS — E119 Type 2 diabetes mellitus without complications: Secondary | ICD-10-CM

## 2015-06-28 DIAGNOSIS — E559 Vitamin D deficiency, unspecified: Secondary | ICD-10-CM | POA: Diagnosis not present

## 2015-06-28 DIAGNOSIS — E785 Hyperlipidemia, unspecified: Secondary | ICD-10-CM | POA: Diagnosis not present

## 2015-06-28 DIAGNOSIS — R638 Other symptoms and signs concerning food and fluid intake: Secondary | ICD-10-CM | POA: Diagnosis not present

## 2015-06-28 LAB — POCT URINALYSIS DIPSTICK
Bilirubin, UA: NEGATIVE
Glucose, UA: NEGATIVE
Ketones, UA: NEGATIVE
LEUKOCYTES UA: NEGATIVE
NITRITE UA: NEGATIVE
PH UA: 7.5
PROTEIN UA: NEGATIVE
RBC UA: NEGATIVE
Spec Grav, UA: 1.01
UROBILINOGEN UA: NEGATIVE

## 2015-06-28 LAB — POCT UA - MICROSCOPIC ONLY
Bacteria, U Microscopic: NEGATIVE
CASTS, UR, LPF, POC: NEGATIVE
Crystals, Ur, HPF, POC: NEGATIVE
MUCUS UA: NEGATIVE
YEAST UA: NEGATIVE

## 2015-06-28 LAB — POCT GLYCOSYLATED HEMOGLOBIN (HGB A1C): HEMOGLOBIN A1C: 6.6

## 2015-06-28 NOTE — Progress Notes (Signed)
Subjective:    Patient ID: Roger Palmer, male    DOB: 02-26-1951, 65 y.o.   MRN: [id number]  HPI Pt here for follow up and management of chronic medical problems which includes hypertension, diabetes, and hyperlipidemia. He is taking medications regularly. As usual, this patient does not complain of any particular problem. Is requesting refills on several medicines. He is due to get lab work today a flu shot today. The patient is pleasant and alert. He is fully retired. He just lost his sister secondary to ovarian/uterine cancer?Marland Kitchen His family history is positive in that his father had a stroke and his mother had diabetes and heart disease. This patient has risk factors for heart disease because of diabetes hyperlipidemia and hypertension. This patient denies chest pain shortness of breath or chest tightness. He swallowing his food well and having no problems with his hiatal hernia as long as he takes omeprazole area he denies nausea vomiting diarrhea or blood in the stool or black tarry bowel movements. He had denies abdominal pain. He is passing his water without problems. He still has stiffness in the right arm from the accident he had several years ago and pain and stiffness in the right hip.      Patient Active Problem List   Diagnosis Date Noted  . Hyperlipidemia 11/09/2013  . 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 High risk medication use 08/14/2012  . 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 Ileus ([place]) 07/23/2012  . Acute kidney injury ([place]) 07/20/2012  . Hypernatremia 07/20/2012  . Acute blood loss anemia 07/14/2012  . Hypokalemia 07/14/2012  . Hypercholesteremia   . Nephrolithiasis   . Hypertension   . Back pain   . Obesity   . BPH (benign prostatic hypertrophy)   . Gastritis   . Diabetes mellitus without complication ([place])   . Right acetabular fracture ([place]) 07/11/2012  . Open dislocation of right elbow 07/11/2012  . Distal radius  fracture, right 07/11/2012  . Fracture of iliac wing ([place]) 07/11/2012  . Pubic ramus fracture ([place]) 07/11/2012  . Acid reflux 05/04/2011   Outpatient Encounter Prescriptions as of 06/28/2015  Medication Sig  . atorvastatin (LIPITOR) 40 MG tablet Take 1 tablet by mouth  daily  . fenofibrate 160 MG tablet Take 160 mg by mouth daily.  . finasteride (PROSCAR) 5 MG tablet Take 1 tablet by mouth  daily  . losartan-hydrochlorothiazide (HYZAAR) 100-12.5 MG tablet Take 1 tablet by mouth  daily  . metFORMIN (GLUCOPHAGE-XR) 500 MG 24 hr tablet Take 1 tablet by mouth  daily with breakfast  . omeprazole (PRILOSEC) 20 MG capsule Take 1 capsule by mouth  every day  . tamsulosin (FLOMAX) 0.4 MG CAPS capsule Take 1 capsule by mouth  every day  . Vitamin D, Ergocalciferol, (DRISDOL) 50000 UNITS CAPS capsule Take 1 capsule by mouth  every 7 days   No facility-administered encounter medications on file as of 06/28/2015.      Review of Systems  Constitutional: Negative.   HENT: Negative.   Eyes: Negative.   Respiratory: Negative.   Cardiovascular: Negative.   Gastrointestinal: Negative.   Endocrine: Negative.   Genitourinary: Negative.   Musculoskeletal: Negative.   Skin: Negative.   Allergic/Immunologic: Negative.   Neurological: Negative.   Hematological: Negative.   Psychiatric/Behavioral: Negative.        Objective:   Physical Exam  Constitutional: He is oriented to person, place, and time. He appears well-developed and well-nourished.  No distress.  HENT:  Head: Normocephalic and atraumatic.  Right Ear: External ear normal.  Left Ear: External ear normal.  Mouth/Throat: Oropharynx is clear and moist. No oropharyngeal exudate.  Some nasal congestion and turbinate swelling bilaterally  Eyes: Conjunctivae and EOM are normal. Pupils are equal, round, and reactive to light. Right eye exhibits no discharge. Left eye exhibits no discharge. No scleral icterus.  Neck: Normal range of motion. Neck  supple. No thyromegaly present.  No bruits thyromegaly or anterior cervical adenopathy  Cardiovascular: Normal rate, regular rhythm, normal heart sounds and intact distal pulses.   No murmur heard. Heart was regular at 72/m  Pulmonary/Chest: Effort normal and breath sounds normal. No respiratory distress. He has no wheezes. He has no rales. He exhibits no tenderness.  Clear anteriorly and posteriorly no axillary adenopathy  Abdominal: Soft. Bowel sounds are normal. He exhibits no mass. There is no tenderness. There is no rebound and no guarding.  Obesity without liver or spleen enlargement or epigastric tenderness. No suprapubic tenderness. No inguinal adenopathy.  Genitourinary: Rectum normal and penis normal.  The prostate was enlarged but soft and smooth. The rectum was clear of masses. The external genitalia were within normal limits and no inguinal hernias were palpable and testicles were normal  Musculoskeletal: Normal range of motion. He exhibits no edema or tenderness.  Lymphadenopathy:    He has no cervical adenopathy.  Neurological: He is alert and oriented to person, place, and time. He has normal reflexes. No cranial nerve deficit.  Skin: Skin is warm and dry. No rash noted.  Psychiatric: He has a normal mood and affect. His behavior is normal. Judgment and thought content normal.  Nursing note and vitals reviewed.  BP 117/67 mmHg  Pulse 72  Temp(Src) 97.6 F (36.4 C) (Oral)  Ht '5\' 9"'$  (1.753 m)  Wt 213 lb (96.616 kg)  BMI 31.44 kg/m2        Assessment & Plan:  1. Essential hypertension -The blood pressure is good today the patient should continue with current treatment - BMP8+EGFR - CBC with Differential/Platelet - Hepatic function panel  2. Hyperlipidemia -Continue with current treatment pending results of lab work - CBC with Differential/Platelet - NMR, lipoprofile  3. BPH (benign prostatic hypertrophy) -The prostate remains enlarged but soft and smooth and  the patient is having no symptoms with passing his water currently. - CBC with Differential/Platelet - PSA, total and free - POCT UA - Microscopic Only - POCT urinalysis dipstick  4. Vitamin D deficiency -Continue current treatment pending results of lab work - CBC with Differential/Platelet - VITAMIN D 25 Hydroxy (Vit-D Deficiency, Fractures)  5. Diabetes mellitus without complication ([place]) -Check blood sugars more regularly and bring these readings in for review to the next visit. Continue with aggressive therapeutic lifestyle changes for cholesterol and blood sugar control - POCT glycosylated hemoglobin (Hb A1C) - CBC with Differential/Platelet  6. Obesity -Weight loss is imperative because of the increased BMI. We do have a Weight Watchers program. Office patient was informed of this.  7. Gastroesophageal reflux disease, esophagitis presence not specified -He is having no symptoms with reflux and he continues to take his omeprazole.  8. Increased BMI -Aggressive management with diet and exercise  No orders of the defined types were placed in this encounter.   Patient Instructions                       Medicare Annual Wellness Visit  [facility] and  the medical providers at [place] strive to bring you the best medical care.  In doing so we not only want to address your current medical conditions and concerns but also to detect new conditions early and prevent illness, disease and health-related problems.    Medicare offers a yearly Wellness Visit which allows our clinical staff to assess your need for preventative services including immunizations, lifestyle education, counseling to decrease risk of preventable diseases and screening for fall risk and other medical concerns.    This visit is provided free of charge (no copay) for all Medicare recipients. The clinical pharmacists at [facility] have begun to conduct these Wellness  Visits which will also include a thorough review of all your medications.    As you primary medical provider recommend that you make an appointment for your Annual Wellness Visit if you have not done so already this year.  You may set up this appointment before you leave today or you may call back [phone]) and schedule an appointment.  Please make sure when you call that you mention that you are scheduling your Annual Wellness Visit with the clinical pharmacist so that the appointment may be made for the proper length of time.     Continue current medications. Continue good therapeutic lifestyle changes which include good diet and exercise. Fall precautions discussed with patient. If an FOBT was given today- please return it to our front desk. If you are over 68 years old - you may need Prevnar 76 or the adult Pneumonia vaccine.  **Flu shots are available--- please call and schedule a FLU-CLINIC appointment**  After your visit with [place] today you will receive a survey in the mail or online from [employer] regarding your care with [place]. Please take a moment to fill this out. Your feedback is very important to [place] as you can help [place] better understand your patient needs as well as improve your experience and satisfaction. WE CARE ABOUT YOU!!!   The patient should monitor his blood sugars more closely and bring these readings in for review at the next visit He needs to make every effort possible to lose more weight because of his increased BMI or body mass index He should watch the carbohydrates in his diet avoid all sweets and avoid all carbonated beverages and drink more water and eat more fresh vegetables. Little efforts over time can make a big difference Do not forget to get an eye exam Please schedule your colonoscopy with Dr. Kathe Becton Use nasal saline for nasal congestion this is over-the-counter.   Arrie Senate MD

## 2015-06-28 NOTE — Addendum Note (Signed)
Addended by: Zannie Cove on: 06/28/2015 10:17 AM   Modules accepted: Orders

## 2015-06-28 NOTE — Patient Instructions (Addendum)
Medicare Annual Wellness Visit  [facility] and the medical providers at [facility] strive to bring you the best medical care.  In doing so we not only want to address your current medical conditions and concerns but also to detect new conditions early and prevent illness, disease and health-related problems.    Medicare offers a yearly Wellness Visit which allows our clinical staff to assess your need for preventative services including immunizations, lifestyle education, counseling to decrease risk of preventable diseases and screening for fall risk and other medical concerns.    This visit is provided free of charge (no copay) for all Medicare recipients. The clinical pharmacists at [facility] have begun to conduct these Wellness Visits which will also include a thorough review of all your medications.    As you primary medical provider recommend that you make an appointment for your Annual Wellness Visit if you have not done so already this year.  You may set up this appointment before you leave today or you may call back [phone] and schedule an appointment.  Please make sure when you call that you mention that you are scheduling your Annual Wellness Visit with the clinical pharmacist so that the appointment may be made for the proper length of time.     Continue current medications. Continue good therapeutic lifestyle changes which include good diet and exercise. Fall precautions discussed with patient. If an FOBT was given today- please return it to our front desk. If you are over 29 years old - you may need Prevnar 21 or the adult Pneumonia vaccine.  **Flu shots are available--- please call and schedule a FLU-CLINIC appointment**  After your visit with [place] today you will receive a survey in the mail or online from [employer] regarding your care with [place]. Please take a moment to fill this out. Your feedback is very  important to [place] as you can help [place] better understand your patient needs as well as improve your experience and satisfaction. WE CARE ABOUT YOU!!!   The patient should monitor his blood sugars more closely and bring these readings in for review at the next visit He needs to make every effort possible to lose more weight because of his increased BMI or body mass index He should watch the carbohydrates in his diet avoid all sweets and avoid all carbonated beverages and drink more water and eat more fresh vegetables. Little efforts over time can make a big difference Do not forget to get an eye exam Please schedule your colonoscopy with Dr. Kathe Becton Use nasal saline for nasal congestion this is over-the-counter.

## 2015-06-29 ENCOUNTER — Telehealth: Payer: Self-pay | Admitting: Family Medicine

## 2015-06-29 LAB — CBC WITH DIFFERENTIAL/PLATELET
BASOS ABS: 0.1 10*3/uL (ref 0.0–0.2)
BASOS: 1 %
EOS (ABSOLUTE): 0.2 10*3/uL (ref 0.0–0.4)
Eos: 3 %
HEMOGLOBIN: 14.2 g/dL (ref 12.6–17.7)
Hematocrit: 41.7 % (ref 37.5–51.0)
Immature Grans (Abs): 0 10*3/uL (ref 0.0–0.1)
Immature Granulocytes: 0 %
LYMPHS ABS: 1.7 10*3/uL (ref 0.7–3.1)
LYMPHS: 32 %
MCH: 30.5 pg (ref 26.6–33.0)
MCHC: 34.1 g/dL (ref 31.5–35.7)
MCV: 90 fL (ref 79–97)
Monocytes Absolute: 0.4 10*3/uL (ref 0.1–0.9)
Monocytes: 7 %
NEUTROS ABS: 3.1 10*3/uL (ref 1.4–7.0)
Neutrophils: 57 %
PLATELETS: 231 10*3/uL (ref 150–379)
RBC: 4.66 x10E6/uL (ref 4.14–5.80)
RDW: 13.5 % (ref 12.3–15.4)
WBC: 5.4 10*3/uL (ref 3.4–10.8)

## 2015-06-29 LAB — BMP8+EGFR
BUN/Creatinine Ratio: 16 (ref 10–22)
BUN: 19 mg/dL (ref 8–27)
CALCIUM: 9.8 mg/dL (ref 8.6–10.2)
CHLORIDE: 103 mmol/L (ref 96–106)
CO2: 24 mmol/L (ref 18–29)
Creatinine, Ser: 1.19 mg/dL (ref 0.76–1.27)
GFR calc non Af Amer: 64 mL/min/{1.73_m2} (ref >59)
GFR, EST AFRICAN AMERICAN: 74 mL/min/{1.73_m2} (ref >59)
Glucose: 129 mg/dL — ABNORMAL HIGH (ref 65–99)
POTASSIUM: 3.8 mmol/L (ref 3.5–5.2)
SODIUM: 142 mmol/L (ref 134–144)

## 2015-06-29 LAB — NMR, LIPOPROFILE
CHOLESTEROL: 153 mg/dL (ref 100–199)
HDL CHOLESTEROL BY NMR: 56 mg/dL (ref >39)
HDL PARTICLE NUMBER: 34.3 umol/L (ref >=30.5)
LDL Particle Number: 1096 nmol/L — ABNORMAL HIGH (ref <1000)
LDL Size: 21 nm (ref >20.5)
LDL-C: 85 mg/dL (ref 0–99)
LP-IR SCORE: 32 (ref <=45)
Small LDL Particle Number: 459 nmol/L (ref <=527)
TRIGLYCERIDES BY NMR: 58 mg/dL (ref 0–149)

## 2015-06-29 LAB — HEPATIC FUNCTION PANEL
ALBUMIN: 4.4 g/dL (ref 3.6–4.8)
ALK PHOS: 43 IU/L (ref 39–117)
ALT: 18 IU/L (ref 0–44)
AST: 22 IU/L (ref 0–40)
Bilirubin Total: 0.8 mg/dL (ref 0.0–1.2)
Bilirubin, Direct: 0.2 mg/dL (ref 0.00–0.40)
TOTAL PROTEIN: 6.9 g/dL (ref 6.0–8.5)

## 2015-06-29 LAB — PSA, TOTAL AND FREE
PSA, Free Pct: 42.9 %
PSA, Free: 0.3 ng/mL
Prostate Specific Ag, Serum: 0.7 ng/mL (ref 0.0–4.0)

## 2015-06-29 LAB — VITAMIN D 25 HYDROXY (VIT D DEFICIENCY, FRACTURES): VIT D 25 HYDROXY: 48 ng/mL (ref 30.0–100.0)

## 2015-06-29 NOTE — Telephone Encounter (Signed)
Pt aware of labs  

## 2015-07-05 ENCOUNTER — Telehealth: Payer: Self-pay | Admitting: Family Medicine

## 2015-07-05 NOTE — Telephone Encounter (Signed)
Please follow-up on this patient is not responding to schedule a visit

## 2015-07-05 NOTE — Telephone Encounter (Signed)
New Message ° °This message is to inform you that we have made 3 consecutive attempts to contact the patienct. We have also mailed a letter to the patient to inform them to call in and schedule. Although we were unsuccessful in these attempts we wanted you to be aware of our efforts. Will remove the patient from our referral work queue at this time.  ° ° °Shanti °[facility] ° °

## 2015-07-05 NOTE — Telephone Encounter (Signed)
Spoke to pt's wife and she said they have a nephew that will be having a tumor removed from his brain 3/3 and they would call after the surgery to schedule.

## 2015-07-19 ENCOUNTER — Ambulatory Visit (INDEPENDENT_AMBULATORY_CARE_PROVIDER_SITE_OTHER): Payer: Medicare Other | Admitting: Cardiology

## 2015-07-19 ENCOUNTER — Encounter: Payer: Self-pay | Admitting: Cardiology

## 2015-07-19 VITALS — BP 126/80 | HR 88 | Ht 69.0 in | Wt 214.0 lb

## 2015-07-19 DIAGNOSIS — I10 Essential (primary) hypertension: Secondary | ICD-10-CM

## 2015-07-19 DIAGNOSIS — R0602 Shortness of breath: Secondary | ICD-10-CM | POA: Diagnosis not present

## 2015-07-19 NOTE — Progress Notes (Signed)
Cardiology Office Note   Date:  07/19/2015   ID:  Roger Palmer, DOB [birth year], MRN [id number]  PCP:  Redge Gainer, MD  Cardiologist:   Minus Breeding, MD   Chief Complaint  Patient presents with  . Shortness of Breath     History of Present Illness: Roger Palmer is a 65 y.o. male who presents for evaluation of multiple cardiovascular risk factors. He is an active gentleman. He will get dyspneic with some vigorous activity. However, he otherwise feels relatively well. He doesn't have any resting shortness of breath, PND or orthopnea. He doesn't notice any palpitations, presyncope or syncope. He's not had any chest pressure, neck or arm discomfort. He's had no weight gain or edema.  Past Medical History  Diagnosis Date  . Bee sting allergy   . Hypercholesteremia   . Nephrolithiasis   . Herniated disc     c7-c7, c5-6, c3-4, c4-5  . Hypertension   . Back pain   . Obesity   . BPH (benign prostatic hypertrophy)   . Gastritis   . Reflux   . Diabetes mellitus without complication [facility])     Past Surgical History  Procedure Laterality Date  . Inguinal hernia repair      right  . Kidney stones      x2 Dr. Rosana Hoes   . Orif r elbow  07/12/2012  . Orif r wirst  07/12/2012  . Open reduction internal fixation (orif) distal radial fracture Right 07/11/2012    Procedure: OPEN REDUCTION INTERNAL FIXATION (ORIF) DISTAL RADIAL FRACTURE;  Surgeon: Schuyler Amor, MD;  Location: [place];  Service: Orthopedics;  Laterality: Right;  . Orif radial fracture Right 07/11/2012    Procedure: OPEN REDUCTION INTERNAL FIXATION (ORIF) RADIAL head FRACTURE;  Surgeon: Schuyler Amor, MD;  Location: [place];  Service: Orthopedics;  Laterality: Right;  . Orif acetabular fracture Right 07/14/2012    Procedure: OPEN REDUCTION INTERNAL FIXATION (ORIF) ACETABULAR FRACTURE;  Surgeon: Rozanna Box, MD;  Location: [place];  Service: Orthopedics;  Laterality: Right;  . Fracture surgery       Current  Outpatient Prescriptions  Medication Sig Dispense Refill  . atorvastatin (LIPITOR) 40 MG tablet Take 1 tablet by mouth  daily 90 tablet 0  . fenofibrate 160 MG tablet Take 160 mg by mouth daily.    . finasteride (PROSCAR) 5 MG tablet Take 1 tablet by mouth  daily 90 tablet 0  . losartan-hydrochlorothiazide (HYZAAR) 100-12.5 MG tablet Take 1 tablet by mouth  daily 90 tablet 0  . metFORMIN (GLUCOPHAGE-XR) 500 MG 24 hr tablet Take 1 tablet by mouth  daily with breakfast 90 tablet 0  . omeprazole (PRILOSEC) 20 MG capsule Take 1 capsule by mouth  every day 90 capsule 0  . tamsulosin (FLOMAX) 0.4 MG CAPS capsule Take 1 capsule by mouth  every day 90 capsule 0  . Vitamin D, Ergocalciferol, (DRISDOL) 50000 UNITS CAPS capsule Take 1 capsule by mouth  every 7 days 11 capsule 0   No current facility-administered medications for this visit.    Allergies:   Review of patient's allergies indicates no known allergies.    Social History:  The patient  reports that he has never smoked. He does not have any smokeless tobacco history on file. He reports that he drinks alcohol. He reports that he does not use illicit drugs.   Family History:  The patient's family history includes Diabetes in his mother; Heart attack (age of onset: 77)  in his mother; Hypertension in his father and mother; Nephrolithiasis in his brother; Stroke in his father.    ROS:  Please see the history of present illness.   Otherwise, review of systems are positive for none.   All other systems are reviewed and negative.    PHYSICAL EXAM: VS:  BP 126/80 mmHg  Pulse 88  Ht 5\' 9"  (1.753 m)  Wt 214 lb (97.07 kg)  BMI 31.59 kg/m2 , BMI Body mass index is 31.59 kg/(m^2). GENERAL:  Well appearing HEENT:  Pupils equal round and reactive, fundi not visualized, oral mucosa unremarkable NECK:  No jugular venous distention, waveform within normal limits, carotid upstroke brisk and symmetric, no bruits, no thyromegaly LYMPHATICS:  No cervical,  inguinal adenopathy LUNGS:  Clear to auscultation bilaterally BACK:  No CVA tenderness CHEST:  Unremarkable HEART:  PMI not displaced or sustained,S1 and S2 within normal limits, no S3, no S4, no clicks, no rubs, no murmurs ABD:  Flat, positive bowel sounds normal in frequency in pitch, no bruits, no rebound, no guarding, no midline pulsatile mass, no hepatomegaly, no splenomegaly EXT:  2 plus pulses throughout, no edema, no cyanosis no clubbing SKIN:  No rashes no nodules NEURO:  Cranial nerves II through XII grossly intact, motor grossly intact throughout PSYCH:  Cognitively intact, oriented to person place and time    EKG:  EKG is ordered today. The ekg ordered today demonstrates sinus rhythm, rate 95, axis within normal limits, intervals within normal limits, nonspecific lateral T-wave changes.   Recent Labs: 06/28/2015: ALT 18; BUN 19; Creatinine, Ser 1.19; Platelets 231; Potassium 3.8; Sodium 142    Lipid Panel    Component Value Date/Time   CHOL 153 06/28/2015 0924   CHOL 123 01/06/2015 1132   CHOL 133 11/11/2012 0821   TRIG 58 06/28/2015 0924   TRIG 81 01/06/2015 1132   TRIG 68 11/11/2012 0821   HDL 56 06/28/2015 0924   HDL 43 01/06/2015 1132   HDL 52 11/11/2012 0821   CHOLHDL 2.9 01/06/2015 1132   LDLCALC 64 01/06/2015 1132   LDLCALC 101* 02/25/2014 0909   LDLCALC 67 11/11/2012 0821      Wt Readings from Last 3 Encounters:  07/19/15 214 lb (97.07 kg)  06/28/15 213 lb (96.616 kg)  04/10/15 216 lb (97.977 kg)      Other studies Reviewed: Additional studies/ records that were reviewed today include: labs, office notes. Review of the above records demonstrates:  Please see elsewhere in the note.     ASSESSMENT AND PLAN:  DYSPNEA:   He does have some mild dyspnea. Given his risk factors I will bring the patient back for a POET (Plain Old Exercise Test). This will allow me to screen for obstructive coronary disease, risk stratify and very importantly provide a  prescription for exercise.  HTN:  The blood pressure is at target. No change in medications is indicated. We will continue with therapeutic lifestyle changes (TLC).  DYSLIPIDEMIA:  His LDL most recently was 17. He is on an excellent regimen. No change in therapy is indicated.   Current medicines are reviewed at length with the patient today.  The patient does not have concerns regarding medicines.  The following changes have been made:  no change  Labs/ tests ordered today include:   Orders Placed This Encounter  Procedures  . Exercise Tolerance Test  . EKG 12-Lead     Disposition:   FU with me as needed.     Signed, Minus Breeding,  MD  07/19/2015 2:19 PM     [facility]

## 2015-07-19 NOTE — Patient Instructions (Signed)
Medication Instructions:  The current medical regimen is effective;  continue present plan and medications.  Testing/Procedures: Your physician has requested that you have an exercise tolerance test. For further information please visit [email or web address]. Please also follow instruction sheet, as given.  Follow-Up: Further follow up will be based on these results.  If you need a refill on your cardiac medications before your next appointment, please call your pharmacy.  Thank you for choosing [facility]!!

## 2015-07-21 ENCOUNTER — Ambulatory Visit (INDEPENDENT_AMBULATORY_CARE_PROVIDER_SITE_OTHER): Payer: Medicare Other

## 2015-07-21 DIAGNOSIS — I10 Essential (primary) hypertension: Secondary | ICD-10-CM | POA: Diagnosis not present

## 2015-07-21 DIAGNOSIS — R0602 Shortness of breath: Secondary | ICD-10-CM | POA: Diagnosis not present

## 2015-07-21 LAB — EXERCISE TOLERANCE TEST
CHL CUP MPHR: 155 {beats}/min
CHL CUP RESTING HR STRESS: 90 {beats}/min
CHL CUP STRESS STAGE 1 HR: 107 {beats}/min
CHL CUP STRESS STAGE 2 GRADE: 0 %
CHL CUP STRESS STAGE 2 SPEED: 1 mph
CHL CUP STRESS STAGE 3 GRADE: 0 %
CHL CUP STRESS STAGE 3 HR: 102 {beats}/min
CHL CUP STRESS STAGE 3 SPEED: 1 mph
CHL CUP STRESS STAGE 4 GRADE: 10 %
CHL CUP STRESS STAGE 4 SBP: 152 mmHg
CHL CUP STRESS STAGE 4 SPEED: 1.7 mph
CHL CUP STRESS STAGE 5 GRADE: 12 %
CHL CUP STRESS STAGE 5 HR: 151 {beats}/min
CHL CUP STRESS STAGE 5 SBP: 186 mmHg
CHL CUP STRESS STAGE 6 HR: 160 {beats}/min
CHL CUP STRESS STAGE 6 SPEED: 3.4 mph
CHL CUP STRESS STAGE 7 DBP: 60 mmHg
CHL CUP STRESS STAGE 7 SBP: 173 mmHg
CHL CUP STRESS STAGE 8 SPEED: 0 mph
CHL RATE OF PERCEIVED EXERTION: 17
CSEPED: 6 min
CSEPHR: 103 %
Estimated workload: 7.7 METS
Exercise duration (sec): 30 s
Peak HR: 160 {beats}/min
Percent of predicted max HR: 103 %
Stage 1 DBP: 89 mmHg
Stage 1 Grade: 0 %
Stage 1 SBP: 149 mmHg
Stage 1 Speed: 0 mph
Stage 2 HR: 103 {beats}/min
Stage 4 DBP: 70 mmHg
Stage 4 HR: 134 {beats}/min
Stage 5 DBP: 65 mmHg
Stage 5 Speed: 2.5 mph
Stage 6 Grade: 14 %
Stage 7 Grade: 0 %
Stage 7 HR: 153 {beats}/min
Stage 7 Speed: 0.8 mph
Stage 8 DBP: 75 mmHg
Stage 8 Grade: 0 %
Stage 8 HR: 112 {beats}/min
Stage 8 SBP: 158 mmHg

## 2015-08-18 ENCOUNTER — Other Ambulatory Visit: Payer: Self-pay | Admitting: Nurse Practitioner

## 2015-08-18 DIAGNOSIS — N63 Unspecified lump in unspecified breast: Secondary | ICD-10-CM

## 2015-09-12 ENCOUNTER — Encounter: Payer: Self-pay | Admitting: *Deleted

## 2015-10-13 ENCOUNTER — Other Ambulatory Visit: Payer: Self-pay | Admitting: Family Medicine

## 2015-10-24 ENCOUNTER — Other Ambulatory Visit: Payer: Self-pay | Admitting: Family Medicine

## 2015-10-31 ENCOUNTER — Ambulatory Visit: Payer: Medicare Other | Admitting: Family Medicine

## 2015-11-06 ENCOUNTER — Other Ambulatory Visit: Payer: Self-pay | Admitting: Family Medicine

## 2015-11-08 ENCOUNTER — Ambulatory Visit: Payer: Medicare Other | Admitting: Family Medicine

## 2015-11-10 ENCOUNTER — Ambulatory Visit (INDEPENDENT_AMBULATORY_CARE_PROVIDER_SITE_OTHER): Payer: Medicare Other | Admitting: Family Medicine

## 2015-11-10 ENCOUNTER — Encounter: Payer: Self-pay | Admitting: Family Medicine

## 2015-11-10 VITALS — BP 134/81 | HR 73 | Temp 96.8°F | Ht 69.0 in | Wt 209.0 lb

## 2015-11-10 DIAGNOSIS — N400 Enlarged prostate without lower urinary tract symptoms: Secondary | ICD-10-CM | POA: Diagnosis not present

## 2015-11-10 DIAGNOSIS — E119 Type 2 diabetes mellitus without complications: Secondary | ICD-10-CM | POA: Diagnosis not present

## 2015-11-10 DIAGNOSIS — E559 Vitamin D deficiency, unspecified: Secondary | ICD-10-CM

## 2015-11-10 DIAGNOSIS — E1141 Type 2 diabetes mellitus with diabetic mononeuropathy: Secondary | ICD-10-CM

## 2015-11-10 DIAGNOSIS — I10 Essential (primary) hypertension: Secondary | ICD-10-CM | POA: Diagnosis not present

## 2015-11-10 DIAGNOSIS — G589 Mononeuropathy, unspecified: Secondary | ICD-10-CM | POA: Diagnosis not present

## 2015-11-10 DIAGNOSIS — E785 Hyperlipidemia, unspecified: Secondary | ICD-10-CM

## 2015-11-10 LAB — BAYER DCA HB A1C WAIVED: HB A1C (BAYER DCA - WAIVED): 6.3 % (ref <7.0)
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
## 2015-11-10 NOTE — Patient Instructions (Addendum)
Medicare Annual Wellness Visit  [facility] and the medical providers at [facility] strive to bring you the best medical care.  In doing so we not only want to address your current medical conditions and concerns but also to detect new conditions early and prevent illness, disease and health-related problems.    Medicare offers a yearly Wellness Visit which allows our clinical staff to assess your need for preventative services including immunizations, lifestyle education, counseling to decrease risk of preventable diseases and screening for fall risk and other medical concerns.    This visit is provided free of charge (no copay) for all Medicare recipients. The clinical pharmacists at [facility] have begun to conduct these Wellness Visits which will also include a thorough review of all your medications.    As you primary medical provider recommend that you make an appointment for your Annual Wellness Visit if you have not done so already this year.  You may set up this appointment before you leave today or you may call back [phone] and schedule an appointment.  Please make sure when you call that you mention that you are scheduling your Annual Wellness Visit with the clinical pharmacist so that the appointment may be made for the proper length of time.  ,m   Continue current medications. Continue good therapeutic lifestyle changes which include good diet and exercise. Fall precautions discussed with patient. If an FOBT was given today- please return it to our front desk. If you are over 66 years old - you may need Prevnar 75 or the adult Pneumonia vaccine.  **Flu shots are available--- please call and schedule a FLU-CLINIC appointment**  After your visit with [place] today you will receive a survey in the mail or online from [employer] regarding your care with [place]. Please take a moment to fill this out. Your feedback is very  important to [place] as you can help [place] better understand your patient needs as well as improve your experience and satisfaction. WE CARE ABOUT YOU!!!   Continue to monitor blood pressures and blood sugars at home and bring these readings in for review when you come to the office Check your feet regularly Exercise regularly and keep weight down as much as possible We will call the lab work results as soon as they become available We will arrange with the [facility] gastroenterology for you to have your repeat colonoscopy sometime this fall. We have used Dr. Hilarie Fredrickson a lot in the past.

## 2015-11-11 LAB — NMR, LIPOPROFILE
Cholesterol: 159 mg/dL (ref 100–199)
HDL Cholesterol by NMR: 59 mg/dL (ref >39)
HDL Particle Number: 36.7 umol/L (ref >=30.5)
LDL Particle Number: 882 nmol/L (ref <1000)
LDL SIZE: 20.5 nm (ref >20.5)
LDL-C: 82 mg/dL (ref 0–99)
LP-IR SCORE: 42 (ref <=45)
SMALL LDL PARTICLE NUMBER: 464 nmol/L (ref <=527)
TRIGLYCERIDES BY NMR: 90 mg/dL (ref 0–149)

## 2015-11-11 LAB — CBC WITH DIFFERENTIAL/PLATELET
BASOS: 1 %
Basophils Absolute: 0.1 10*3/uL (ref 0.0–0.2)
EOS (ABSOLUTE): 0.1 10*3/uL (ref 0.0–0.4)
EOS: 2 %
HEMATOCRIT: 43.3 % (ref 37.5–51.0)
HEMOGLOBIN: 14.5 g/dL (ref 12.6–17.7)
IMMATURE GRANS (ABS): 0 10*3/uL (ref 0.0–0.1)
IMMATURE GRANULOCYTES: 0 %
Lymphocytes Absolute: 1.9 10*3/uL (ref 0.7–3.1)
Lymphs: 27 %
MCH: 29.9 pg (ref 26.6–33.0)
MCHC: 33.5 g/dL (ref 31.5–35.7)
MCV: 89 fL (ref 79–97)
Monocytes Absolute: 0.4 10*3/uL (ref 0.1–0.9)
Monocytes: 6 %
NEUTROS ABS: 4.3 10*3/uL (ref 1.4–7.0)
Neutrophils: 64 %
Platelets: 219 10*3/uL (ref 150–379)
RBC: 4.85 x10E6/uL (ref 4.14–5.80)
RDW: 14.2 % (ref 12.3–15.4)
WBC: 6.8 10*3/uL (ref 3.4–10.8)

## 2015-11-11 LAB — BMP8+EGFR
BUN / CREAT RATIO: 12 (ref 10–24)
BUN: 15 mg/dL (ref 8–27)
CO2: 24 mmol/L (ref 18–29)
CREATININE: 1.23 mg/dL (ref 0.76–1.27)
Calcium: 10.1 mg/dL (ref 8.6–10.2)
Chloride: 104 mmol/L (ref 96–106)
GFR, EST AFRICAN AMERICAN: 70 mL/min/{1.73_m2} (ref >59)
GFR, EST NON AFRICAN AMERICAN: 61 mL/min/{1.73_m2} (ref >59)
Glucose: 112 mg/dL — ABNORMAL HIGH (ref 65–99)
Potassium: 4.1 mmol/L (ref 3.5–5.2)
SODIUM: 145 mmol/L — AB (ref 134–144)

## 2015-11-11 LAB — VITAMIN D 25 HYDROXY (VIT D DEFICIENCY, FRACTURES): Vit D, 25-Hydroxy: 44.8 ng/mL (ref 30.0–100.0)

## 2015-11-11 LAB — MICROALBUMIN / CREATININE URINE RATIO
CREATININE, UR: 104.6 mg/dL
MICROALB/CREAT RATIO: <2.9 mg/g creat (ref 0.0–30.0)

## 2015-11-11 LAB — HEPATIC FUNCTION PANEL
ALK PHOS: 50 IU/L (ref 39–117)
ALT: 16 IU/L (ref 0–44)
AST: 20 IU/L (ref 0–40)
Albumin: 4.6 g/dL (ref 3.6–4.8)
BILIRUBIN TOTAL: 0.5 mg/dL (ref 0.0–1.2)
BILIRUBIN, DIRECT: 0.15 mg/dL (ref 0.00–0.40)
Total Protein: 7.2 g/dL (ref 6.0–8.5)

## 2015-11-20 ENCOUNTER — Other Ambulatory Visit: Payer: Self-pay | Admitting: Family Medicine

## 2015-11-29 ENCOUNTER — Encounter: Payer: Self-pay | Admitting: Internal Medicine

## 2016-01-02 ENCOUNTER — Other Ambulatory Visit: Payer: Self-pay | Admitting: Family Medicine

## 2016-01-24 ENCOUNTER — Other Ambulatory Visit: Payer: Self-pay | Admitting: Family Medicine

## 2016-04-01 ENCOUNTER — Ambulatory Visit (INDEPENDENT_AMBULATORY_CARE_PROVIDER_SITE_OTHER): Payer: Medicare Other | Admitting: Family Medicine

## 2016-04-01 ENCOUNTER — Other Ambulatory Visit: Payer: Self-pay | Admitting: Family Medicine

## 2016-04-01 ENCOUNTER — Encounter: Payer: Self-pay | Admitting: Internal Medicine

## 2016-04-01 ENCOUNTER — Encounter (INDEPENDENT_AMBULATORY_CARE_PROVIDER_SITE_OTHER): Payer: Self-pay

## 2016-04-01 ENCOUNTER — Encounter: Payer: Self-pay | Admitting: Gastroenterology

## 2016-04-01 ENCOUNTER — Encounter: Payer: Self-pay | Admitting: Family Medicine

## 2016-04-01 VITALS — BP 109/66 | HR 76 | Temp 97.2°F | Ht 69.0 in | Wt 209.0 lb

## 2016-04-01 DIAGNOSIS — E7800 Pure hypercholesterolemia, unspecified: Secondary | ICD-10-CM

## 2016-04-01 DIAGNOSIS — I10 Essential (primary) hypertension: Secondary | ICD-10-CM

## 2016-04-01 DIAGNOSIS — Z23 Encounter for immunization: Secondary | ICD-10-CM

## 2016-04-01 DIAGNOSIS — E559 Vitamin D deficiency, unspecified: Secondary | ICD-10-CM

## 2016-04-01 DIAGNOSIS — E119 Type 2 diabetes mellitus without complications: Secondary | ICD-10-CM

## 2016-04-01 LAB — BAYER DCA HB A1C WAIVED: HB A1C (BAYER DCA - WAIVED): 6.8 % (ref <7.0)
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
## 2016-04-01 NOTE — Patient Instructions (Addendum)
Medicare Annual Wellness Visit  [facility] and the medical providers at [facility] strive to bring you the best medical care.  In doing so we not only want to address your current medical conditions and concerns but also to detect new conditions early and prevent illness, disease and health-related problems.    Medicare offers a yearly Wellness Visit which allows our clinical staff to assess your need for preventative services including immunizations, lifestyle education, counseling to decrease risk of preventable diseases and screening for fall risk and other medical concerns.    This visit is provided free of charge (no copay) for all Medicare recipients. The clinical pharmacists at [facility] have begun to conduct these Wellness Visits which will also include a thorough review of all your medications.    As you primary medical provider recommend that you make an appointment for your Annual Wellness Visit if you have not done so already this year.  You may set up this appointment before you leave today or you may call back [phone] and schedule an appointment.  Please make sure when you call that you mention that you are scheduling your Annual Wellness Visit with the clinical pharmacist so that the appointment may be made for the proper length of time.     Continue current medications. Continue good therapeutic lifestyle changes which include good diet and exercise. Fall precautions discussed with patient. If an FOBT was given today- please return it to our front desk. If you are over 33 years old - you may need Prevnar 36 or the adult Pneumonia vaccine.  **Flu shots are available--- please call and schedule a FLU-CLINIC appointment**  After your visit with [place] today you will receive a survey in the mail or online from [employer] regarding your care with [place]. Please take a moment to fill this out. Your feedback is very  important to [place] as you can help [place] better understand your patient needs as well as improve your experience and satisfaction. WE CARE ABOUT YOU!!!   The patient should continue to stay active physically and follow-up as aggressive therapeutic lifestyle changes as possible The flu shot and received today may make his arm sore When he gets his colonoscopy he should make sure that he has a doctor send [place] a copy of that report. Tinny to monitor blood sugars regularly. Check some blood sugars 2 hours after eating.

## 2016-04-02 ENCOUNTER — Other Ambulatory Visit: Payer: Self-pay | Admitting: Family Medicine

## 2016-04-02 LAB — CBC WITH DIFFERENTIAL/PLATELET
BASOS: 1 %
Basophils Absolute: 0.1 10*3/uL (ref 0.0–0.2)
EOS (ABSOLUTE): 0.1 10*3/uL (ref 0.0–0.4)
EOS: 2 %
HEMATOCRIT: 40.3 % (ref 37.5–51.0)
Hemoglobin: 13.6 g/dL (ref 12.6–17.7)
IMMATURE GRANS (ABS): 0 10*3/uL (ref 0.0–0.1)
IMMATURE GRANULOCYTES: 0 %
LYMPHS: 30 %
Lymphocytes Absolute: 1.7 10*3/uL (ref 0.7–3.1)
MCH: 30.5 pg (ref 26.6–33.0)
MCHC: 33.7 g/dL (ref 31.5–35.7)
MCV: 90 fL (ref 79–97)
Monocytes Absolute: 0.6 10*3/uL (ref 0.1–0.9)
Monocytes: 10 %
NEUTROS ABS: 3.3 10*3/uL (ref 1.4–7.0)
Neutrophils: 57 %
PLATELETS: 236 10*3/uL (ref 150–379)
RBC: 4.46 x10E6/uL (ref 4.14–5.80)
RDW: 13.4 % (ref 12.3–15.4)
WBC: 5.8 10*3/uL (ref 3.4–10.8)

## 2016-04-02 LAB — HEPATIC FUNCTION PANEL
ALK PHOS: 53 IU/L (ref 39–117)
ALT: 16 IU/L (ref 0–44)
AST: 16 IU/L (ref 0–40)
Albumin: 4.3 g/dL (ref 3.6–4.8)
BILIRUBIN TOTAL: 0.7 mg/dL (ref 0.0–1.2)
BILIRUBIN, DIRECT: 0.15 mg/dL (ref 0.00–0.40)
Total Protein: 7 g/dL (ref 6.0–8.5)

## 2016-04-02 LAB — BMP8+EGFR
BUN/Creatinine Ratio: 14 (ref 10–24)
BUN: 15 mg/dL (ref 8–27)
CALCIUM: 9.8 mg/dL (ref 8.6–10.2)
CO2: 26 mmol/L (ref 18–29)
CREATININE: 1.08 mg/dL (ref 0.76–1.27)
Chloride: 106 mmol/L (ref 96–106)
GFR, EST AFRICAN AMERICAN: 82 mL/min/{1.73_m2} (ref >59)
GFR, EST NON AFRICAN AMERICAN: 71 mL/min/{1.73_m2} (ref >59)
Glucose: 133 mg/dL — ABNORMAL HIGH (ref 65–99)
POTASSIUM: 3.5 mmol/L (ref 3.5–5.2)
SODIUM: 147 mmol/L — AB (ref 134–144)

## 2016-04-02 LAB — NMR, LIPOPROFILE
Cholesterol: 131 mg/dL (ref 100–199)
HDL Cholesterol by NMR: 47 mg/dL (ref >39)
HDL Particle Number: 32.6 umol/L (ref >=30.5)
LDL PARTICLE NUMBER: 752 nmol/L (ref <1000)
LDL SIZE: 21.1 nm (ref >20.5)
LDL-C: 70 mg/dL (ref 0–99)
LP-IR SCORE: 26 (ref <=45)
SMALL LDL PARTICLE NUMBER: 332 nmol/L (ref <=527)
TRIGLYCERIDES BY NMR: 69 mg/dL (ref 0–149)

## 2016-04-02 LAB — VITAMIN D 25 HYDROXY (VIT D DEFICIENCY, FRACTURES): VIT D 25 HYDROXY: 45.4 ng/mL (ref 30.0–100.0)

## 2016-04-02 MED ORDER — VITAMIN D (ERGOCALCIFEROL) 1.25 MG (50000 UNIT) PO CAPS: 50000.0000 [IU] | ORAL_CAPSULE | ORAL | 1 refills | Status: DC

## 2016-05-15 ENCOUNTER — Other Ambulatory Visit: Payer: Self-pay | Admitting: Family Medicine

## 2016-05-21 ENCOUNTER — Ambulatory Visit (AMBULATORY_SURGERY_CENTER): Payer: Self-pay

## 2016-05-21 VITALS — Ht 69.0 in | Wt 220.2 lb

## 2016-05-21 DIAGNOSIS — Z1211 Encounter for screening for malignant neoplasm of colon: Secondary | ICD-10-CM

## 2016-05-21 MED ORDER — NA SULFATE-K SULFATE-MG SULF 17.5-3.13-1.6 GM/177ML PO SOLN: ORAL | 0 refills | Status: DC

## 2016-05-21 NOTE — Progress Notes (Signed)
Per pt, no allergies to soy or egg products.Pt not taking any weight loss meds or using  O2 at home. 

## 2016-06-04 ENCOUNTER — Encounter: Payer: Medicare Other | Admitting: Gastroenterology

## 2016-06-05 ENCOUNTER — Ambulatory Visit (AMBULATORY_SURGERY_CENTER): Payer: Medicare Other | Admitting: Internal Medicine

## 2016-06-05 ENCOUNTER — Encounter: Payer: Self-pay | Admitting: Internal Medicine

## 2016-06-05 VITALS — BP 121/61 | HR 69 | Temp 97.3°F | Resp 16 | Ht 69.0 in | Wt 220.0 lb

## 2016-06-05 DIAGNOSIS — I10 Essential (primary) hypertension: Secondary | ICD-10-CM | POA: Diagnosis not present

## 2016-06-05 DIAGNOSIS — E119 Type 2 diabetes mellitus without complications: Secondary | ICD-10-CM | POA: Diagnosis not present

## 2016-06-05 DIAGNOSIS — K219 Gastro-esophageal reflux disease without esophagitis: Secondary | ICD-10-CM | POA: Diagnosis not present

## 2016-06-05 DIAGNOSIS — Z1212 Encounter for screening for malignant neoplasm of rectum: Secondary | ICD-10-CM | POA: Diagnosis not present

## 2016-06-05 DIAGNOSIS — Z1211 Encounter for screening for malignant neoplasm of colon: Secondary | ICD-10-CM

## 2016-06-05 LAB — GLUCOSE, CAPILLARY
GLUCOSE-CAPILLARY: 130 mg/dL — AB (ref 65–99)
Glucose-Capillary: 104 mg/dL — ABNORMAL HIGH (ref 65–99)

## 2016-06-05 MED ORDER — SODIUM CHLORIDE 0.9 % IV SOLN: 500.0000 mL | INTRAVENOUS | Status: DC

## 2016-06-05 NOTE — Op Note (Signed)
[place] Patient Name: Roger Palmer Procedure Date: 06/05/2016 11:13 AM MRN: [id number] Endoscopist: Jerene Bears , MD Age: 66 Referring MD:  Date of Birth: 10-19-1950 Gender: Male Account #: [id number] Procedure:                Colonoscopy Indications:              Screening for colorectal malignant neoplasm, Last                            colonoscopy 10 years ago Medicines:                Monitored Anesthesia Care Procedure:                Pre-Anesthesia Assessment:                           - Prior to the procedure, a History and Physical                            was performed, and patient medications and                            allergies were reviewed. The patient's tolerance of                            previous anesthesia was also reviewed. The risks                            and benefits of the procedure and the sedation                            options and risks were discussed with the patient.                            All questions were answered, and informed consent                            was obtained. Prior Anticoagulants: The patient has                            taken no previous anticoagulant or antiplatelet                            agents. ASA Grade Assessment: II - A patient with                            mild systemic disease. After reviewing the risks                            and benefits, the patient was deemed in                            satisfactory condition to undergo the procedure.  After obtaining informed consent, the colonoscope                            was passed under direct vision. Throughout the                            procedure, the patient's blood pressure, pulse, and                            oxygen saturations were monitored continuously. The                            Model PCF-H190L [id number]) scope was introduced                            through the anus and advanced to the  the cecum,                            identified by appendiceal orifice and ileocecal                            valve. The colonoscopy was performed without                            difficulty. The patient tolerated the procedure                            well. The quality of the bowel preparation was                            good. The ileocecal valve, appendiceal orifice, and                            rectum were photographed. Scope In: 11:27:27 AM Scope Out: 11:38:54 AM Scope Withdrawal Time: 0 hours 9 minutes 34 seconds  Total Procedure Duration: 0 hours 11 minutes 27 seconds  Findings:                 The perianal exam findings include a skin tag.                           Internal hemorrhoids were found during                            retroflexion. The hemorrhoids were medium-sized.                           The exam was otherwise without abnormality. Complications:            No immediate complications. Estimated Blood Loss:     Estimated blood loss: none. Impression:               - Internal hemorrhoids and skin tag.                           - The examination was  otherwise normal.                           - No specimens collected. Recommendation:           - Patient has a contact number available for                            emergencies. The signs and symptoms of potential                            delayed complications were discussed with the                            patient. Return to normal activities tomorrow.                            Written discharge instructions were provided to the                            patient.                           - Resume previous diet.                           - Continue present medications.                           - Repeat colonoscopy in 10 years for screening                            purposes. Jerene Bears, MD 06/05/2016 11:41:19 AM This report has been signed electronically.

## 2016-06-05 NOTE — Progress Notes (Signed)
Report to PACU, RN, vss, BBS= Clear.  

## 2016-06-05 NOTE — Patient Instructions (Signed)
  HANDOUTS GIVEN : HEMORRHOIDS, HEMORRHOIDAL BANDING.   YOU HAD AN ENDOSCOPIC PROCEDURE TODAY AT [facility]:   Refer to the procedure report that was given to you for any specific questions about what was found during the examination.  If the procedure report does not answer your questions, please call your gastroenterologist to clarify.  If you requested that your care partner not be given the details of your procedure findings, then the procedure report has been included in a sealed envelope for you to review at your convenience later.  YOU SHOULD EXPECT: Some feelings of bloating in the abdomen. Passage of more gas than usual.  Walking can help get rid of the air that was put into your GI tract during the procedure and reduce the bloating. If you had a lower endoscopy (such as a colonoscopy or flexible sigmoidoscopy) you may notice spotting of blood in your stool or on the toilet paper. If you underwent a bowel prep for your procedure, you may not have a normal bowel movement for a few days.  Please Note:  You might notice some irritation and congestion in your nose or some drainage.  This is from the oxygen used during your procedure.  There is no need for concern and it should clear up in a day or so.  SYMPTOMS TO REPORT IMMEDIATELY:   Following lower endoscopy (colonoscopy or flexible sigmoidoscopy):  Excessive amounts of blood in the stool  Significant tenderness or worsening of abdominal pains  Swelling of the abdomen that is new, acute  Fever of 100F or higher   For urgent or emergent issues, a gastroenterologist can be reached at any hour by calling [phone].   DIET:  We do recommend a small meal at first, but then you may proceed to your regular diet.  Drink plenty of fluids but you should avoid alcoholic beverages for 24 hours.  ACTIVITY:  You should plan to take it easy for the rest of today and you should NOT DRIVE or use heavy machinery until tomorrow  (because of the sedation medicines used during the test).    FOLLOW UP: Our staff will call the number listed on your records the next business day following your procedure to check on you and address any questions or concerns that you may have regarding the information given to you following your procedure. If we do not reach you, we will leave a message.  However, if you are feeling well and you are not experiencing any problems, there is no need to return our call.  We will assume that you have returned to your regular daily activities without incident.  If any biopsies were taken you will be contacted by phone or by letter within the next 1-3 weeks.  Please call us at [phone] if you have not heard about the biopsies in 3 weeks.    SIGNATURES/CONFIDENTIALITY: You and/or your care partner have signed paperwork which will be entered into your electronic medical record.  These signatures attest to the fact that that the information above on your After Visit Summary has been reviewed and is understood.  Full responsibility of the confidentiality of this discharge information lies with you and/or your care-partner.

## 2016-06-06 ENCOUNTER — Telehealth: Payer: Self-pay

## 2016-06-06 NOTE — Telephone Encounter (Signed)
  Follow up Call-  Call back number 06/05/2016  Post procedure Call Back phone  # [phone]  Permission to leave phone message Yes  Some recent data might be hidden     Patient questions:  Do you have a fever, pain , or abdominal swelling? No. Pain Score  0 *  Have you tolerated food without any problems? Yes.    Have you been able to return to your normal activities? Yes.    Do you have any questions about your discharge instructions: Diet   No. Medications  No. Follow up visit  No.  Do you have questions or concerns about your Care? No.  Actions: * If pain score is 4 or above: No action needed, pain <4.

## 2016-06-13 ENCOUNTER — Encounter: Payer: Self-pay | Admitting: Family Medicine

## 2016-07-05 ENCOUNTER — Other Ambulatory Visit: Payer: Self-pay | Admitting: Family Medicine

## 2016-08-05 ENCOUNTER — Encounter: Payer: Self-pay | Admitting: Family Medicine

## 2016-08-05 ENCOUNTER — Ambulatory Visit (INDEPENDENT_AMBULATORY_CARE_PROVIDER_SITE_OTHER): Payer: Medicare Other | Admitting: Family Medicine

## 2016-08-05 VITALS — BP 114/68 | HR 87 | Temp 97.0°F | Ht 69.0 in | Wt 214.0 lb

## 2016-08-05 DIAGNOSIS — E7800 Pure hypercholesterolemia, unspecified: Secondary | ICD-10-CM

## 2016-08-05 DIAGNOSIS — I10 Essential (primary) hypertension: Secondary | ICD-10-CM

## 2016-08-05 DIAGNOSIS — R8281 Pyuria: Secondary | ICD-10-CM

## 2016-08-05 DIAGNOSIS — N400 Enlarged prostate without lower urinary tract symptoms: Secondary | ICD-10-CM

## 2016-08-05 DIAGNOSIS — K219 Gastro-esophageal reflux disease without esophagitis: Secondary | ICD-10-CM

## 2016-08-05 DIAGNOSIS — N390 Urinary tract infection, site not specified: Secondary | ICD-10-CM | POA: Diagnosis not present

## 2016-08-05 DIAGNOSIS — E119 Type 2 diabetes mellitus without complications: Secondary | ICD-10-CM | POA: Diagnosis not present

## 2016-08-05 DIAGNOSIS — E559 Vitamin D deficiency, unspecified: Secondary | ICD-10-CM | POA: Diagnosis not present

## 2016-08-05 LAB — MICROSCOPIC EXAMINATION
BACTERIA UA: NONE SEEN
Epithelial Cells (non renal): NONE SEEN /hpf (ref 0–10)
RBC, UA: NONE SEEN /hpf (ref 0–?)
RENAL EPITHEL UA: NONE SEEN /HPF

## 2016-08-05 LAB — URINALYSIS, COMPLETE
Bilirubin, UA: NEGATIVE
Glucose, UA: NEGATIVE
Ketones, UA: NEGATIVE
Nitrite, UA: NEGATIVE
PH UA: 7 (ref 5.0–7.5)
Protein, UA: NEGATIVE
RBC, UA: NEGATIVE
Specific Gravity, UA: 1.02 (ref 1.005–1.030)
Urobilinogen, Ur: 1 mg/dL (ref 0.2–1.0)

## 2016-08-05 LAB — BAYER DCA HB A1C WAIVED: HB A1C: 7 % — AB (ref <7.0)

## 2016-08-05 NOTE — Progress Notes (Signed)
Subjective:    Patient ID: Roger Palmer, male    DOB: 1950-08-31, 66 y.o.   MRN: [id number]  HPI Pt here for follow up and management of chronic medical problems which includes hyperlipidemia, hypertension and diabetes. He is taking medication regularly.The patient is doing well overall and his usual has no complaints. He does have diabetes hyperlipidemia and GERD. He will get lab work today. He is due to get his rectal prostate exam. He is not requiring any refills. His vital signs are stable and the good news is his weight is down 6 pounds from the previous visit. His body mass index is still above 30. The patient denies any chest pain or shortness of breath. He denies any trouble with heartburn indigestion nausea vomiting diarrhea blood in the stool or black tarry bowel movements movements. He just had a recent colonoscopy in January of this year and will not be due to get another one for 10 years. He is passing his water without problems and is taking finasteride for this. We did discuss with the patient about the long-term complications and use of proton pump inhibitors and he will try a ranitidine 150 mg twice daily before breakfast and supper over-the-counter. I told that if he had any breakthrough heartburn or indigestion that he should either increase his ranitidine to 300 twice a day or go back on the proton pump inhibitor.   Patient Active Problem List   Diagnosis Date Noted  . Hyperlipidemia 11/09/2013  . 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 High risk medication use 08/14/2012  . 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 Hypernatremia 07/20/2012  . Hypokalemia 07/14/2012  . Nephrolithiasis   . Hypertension   . Back pain   . Obesity   . BPH (benign prostatic hypertrophy)   . Gastritis   . Diabetes mellitus without complication ([place])   . Acid reflux 05/04/2011   Outpatient Encounter Prescriptions as of 08/05/2016  Medication Sig  .  atorvastatin (LIPITOR) 40 MG tablet TAKE ONE (1) TABLET EACH DAY  . fenofibrate 160 MG tablet TAKE ONE (1) TABLET EACH DAY  . finasteride (PROSCAR) 5 MG tablet TAKE ONE (1) TABLET EACH DAY  . loratadine (CLARITIN) 10 MG tablet Take 10 mg by mouth as needed for allergies.  Marland Kitchen losartan-hydrochlorothiazide (HYZAAR) 100-12.5 MG tablet TAKE ONE (1) TABLET EACH DAY  . metFORMIN (GLUCOPHAGE-XR) 500 MG 24 hr tablet TAKE 1 TABLET EVERY MORNING WITH BREAKFAST  . omeprazole (PRILOSEC) 20 MG capsule TAKE ONE (1) CAPSULE EACH DAY  . tamsulosin (FLOMAX) 0.4 MG CAPS capsule TAKE ONE (1) CAPSULE EACH DAY  . Vitamin D, Ergocalciferol, (DRISDOL) 50000 units CAPS capsule Take 1 capsule (50,000 Units total) by mouth every 7 (seven) days.   Facility-Administered Encounter Medications as of 08/05/2016  Medication  . 0.9 %  sodium chloride infusion      Review of Systems  Constitutional: Negative.   HENT: Negative.   Eyes: Negative.   Respiratory: Negative.   Cardiovascular: Negative.   Gastrointestinal: Negative.   Endocrine: Negative.   Genitourinary: Negative.   Musculoskeletal: Negative.   Skin: Negative.   Allergic/Immunologic: Negative.   Neurological: Negative.   Hematological: Negative.   Psychiatric/Behavioral: Negative.        Objective:   Physical Exam  Constitutional: He is oriented to person, place, and time. He appears well-developed and well-nourished. No distress.  Patient is pleasant and alert  HENT:  Head: Normocephalic and atraumatic.  Right Ear: External ear normal.  Left Ear: External ear normal.  Mouth/Throat: Oropharynx is clear and moist. No oropharyngeal exudate.  Nasal congestion bilaterally left greater than right  Eyes: Conjunctivae and EOM are normal. Pupils are equal, round, and reactive to light. Right eye exhibits no discharge. Left eye exhibits no discharge. No scleral icterus.  Get eye exam as planned  Neck: Normal range of motion. Neck supple. No thyromegaly  present.  No bruits thyromegaly or anterior cervical adenopathy  Cardiovascular: Normal rate, regular rhythm, normal heart sounds and intact distal pulses.   No murmur heard. The heart has a regular rate and rhythm at 84/m  Pulmonary/Chest: Effort normal and breath sounds normal. No respiratory distress. He has no wheezes. He has no rales. He exhibits no tenderness.  Clear anteriorly and posteriorly no axillary adenopathy  Abdominal: Soft. Bowel sounds are normal. He exhibits no mass. There is no tenderness. There is no rebound and no guarding.  No abdominal tenderness masses or organ enlargement or bruits  Genitourinary: Rectum normal and penis normal.  Genitourinary Comments: Prostate is slightly enlarged bilaterally without lumps or masses. There is no rectal masses. External genitalia were within normal limits. There is no hernia palpated on either side.  Musculoskeletal: He exhibits no edema.  Because of the fall from several years ago the patient is unable to fully extend the right arm. He also has occasional problems with the right leg with numbness.  Lymphadenopathy:    He has no cervical adenopathy.  Neurological: He is alert and oriented to person, place, and time. He has normal reflexes. No cranial nerve deficit.  Skin: Skin is warm and dry. No rash noted.  The patient has dry skin in general.  Psychiatric: He has a normal mood and affect. His behavior is normal. Judgment and thought content normal.  Nursing note and vitals reviewed.  BP 114/68 (BP Location: Left Arm)   Pulse 87   Temp 97 F (36.1 C) (Oral)   Ht '5\' 9"'$  (1.753 m)   Wt 214 lb (97.1 kg)   BMI 31.60 kg/m         Assessment & Plan:  1. Essential hypertension -The blood pressure is good today the patient will continue with current treatment - CBC with Differential/Platelet - BMP8+EGFR - Hepatic function panel  2. Pure hypercholesterolemia -Continue with current treatment and aggressive therapeutic  lifestyle changes pending results of lab work - CBC with Differential/Platelet - Lipid panel  3. Vitamin D deficiency -Continue with vitamin D replacement pending results of lab work - CBC with Differential/Platelet - VITAMIN D 25 Hydroxy (Vit-D Deficiency, Fractures)  4. Diabetes mellitus without complication ([place]) -Continue with current treatment and as aggressive therapeutic lifestyle changes as possible - CBC with Differential/Platelet - BMP8+EGFR - Bayer DCA Hb A1c Waived  5. Gastroesophageal reflux disease, esophagitis presence not specified -No complaints with this today. The patient has been on omeprazole for years and we will try to switch to ranitidine 150 twice daily before breakfast and supper over-the-counter. He knows to switch back to the omeprazole if the ranitidine doesn't control his reflux symptoms. - CBC with Differential/Platelet - Hepatic function panel  6. Benign prostatic hyperplasia, unspecified whether lower urinary tract symptoms present -The prostate remains enlarged but the patient is having no symptoms with this. - PSA, total and free - Urinalysis, Complete  Patient Instructions  Medicare Annual Wellness Visit  [facility] and the medical providers at [facility] strive to bring you the best medical care.  In doing so we not only want to address your current medical conditions and concerns but also to detect new conditions early and prevent illness, disease and health-related problems.    Medicare offers a yearly Wellness Visit which allows our clinical staff to assess your need for preventative services including immunizations, lifestyle education, counseling to decrease risk of preventable diseases and screening for fall risk and other medical concerns.    This visit is provided free of charge (no copay) for all Medicare recipients. The clinical pharmacists at [facility] have begun to  conduct these Wellness Visits which will also include a thorough review of all your medications.    As you primary medical provider recommend that you make an appointment for your Annual Wellness Visit if you have not done so already this year.  You may set up this appointment before you leave today or you may call back [phone]) and schedule an appointment.  Please make sure when you call that you mention that you are scheduling your Annual Wellness Visit with the clinical pharmacist so that the appointment may be made for the proper length of time.      Continue current medications. Continue good therapeutic lifestyle changes which include good diet and exercise. Fall precautions discussed with patient. If an FOBT was given today- please return it to our front desk. If you are over 64 years old - you may need Prevnar 27 or the adult Pneumonia vaccine.  **Flu shots are available--- please call and schedule a FLU-CLINIC appointment**  After your visit with [place] today you will receive a survey in the mail or online from [employer] regarding your care with [place]. Please take a moment to fill this out. Your feedback is very important to [place] as you can help [place] better understand your patient needs as well as improve your experience and satisfaction. WE CARE ABOUT YOU!!!  Try Zantac or ranitidine 150 mg twice daily before breakfast and supper to see if this controls heartburn and indigestion as well as the omeprazole. If it does not and you have breakthrough heartburn returned and reflux you must try increasing it to 300 mg twice daily and if this doesn't work he should go back on the omeprazole once daily as you have been doing. Please make an appointment to see your eye doctor and make sure that we get a copy of that report. We will call with your blood work results as soon as those results become available.   Arrie Senate MD

## 2016-08-05 NOTE — Addendum Note (Signed)
Addended by: Zannie Cove on: 08/05/2016 02:17 PM   Modules accepted: Orders

## 2016-08-05 NOTE — Patient Instructions (Addendum)
Medicare Annual Wellness Visit  [facility] and the medical providers at [facility] strive to bring you the best medical care.  In doing so we not only want to address your current medical conditions and concerns but also to detect new conditions early and prevent illness, disease and health-related problems.    Medicare offers a yearly Wellness Visit which allows our clinical staff to assess your need for preventative services including immunizations, lifestyle education, counseling to decrease risk of preventable diseases and screening for fall risk and other medical concerns.    This visit is provided free of charge (no copay) for all Medicare recipients. The clinical pharmacists at [facility] have begun to conduct these Wellness Visits which will also include a thorough review of all your medications.    As you primary medical provider recommend that you make an appointment for your Annual Wellness Visit if you have not done so already this year.  You may set up this appointment before you leave today or you may call back [phone]) and schedule an appointment.  Please make sure when you call that you mention that you are scheduling your Annual Wellness Visit with the clinical pharmacist so that the appointment may be made for the proper length of time.      Continue current medications. Continue good therapeutic lifestyle changes which include good diet and exercise. Fall precautions discussed with patient. If an FOBT was given today- please return it to our front desk. If you are over 43 years old - you may need Prevnar 39 or the adult Pneumonia vaccine.  **Flu shots are available--- please call and schedule a FLU-CLINIC appointment**  After your visit with [place] today you will receive a survey in the mail or online from [employer] regarding your care with [place]. Please take a moment to fill this out. Your feedback is very  important to [place] as you can help [place] better understand your patient needs as well as improve your experience and satisfaction. WE CARE ABOUT YOU!!!  Try Zantac or ranitidine 150 mg twice daily before breakfast and supper to see if this controls heartburn and indigestion as well as the omeprazole. If it does not and you have breakthrough heartburn returned and reflux you must try increasing it to 300 mg twice daily and if this doesn't work he should go back on the omeprazole once daily as you have been doing. Please make an appointment to see your eye doctor and make sure that we get a copy of that report. We will call with your blood work results as soon as those results become available.

## 2016-08-06 LAB — BMP8+EGFR
BUN/Creatinine Ratio: 13 (ref 10–24)
BUN: 15 mg/dL (ref 8–27)
CALCIUM: 9.7 mg/dL (ref 8.6–10.2)
CO2: 23 mmol/L (ref 18–29)
CREATININE: 1.18 mg/dL (ref 0.76–1.27)
Chloride: 105 mmol/L (ref 96–106)
GFR calc Af Amer: 74 mL/min/{1.73_m2} (ref >59)
GFR calc non Af Amer: 64 mL/min/{1.73_m2} (ref >59)
Glucose: 142 mg/dL — ABNORMAL HIGH (ref 65–99)
Potassium: 3.9 mmol/L (ref 3.5–5.2)
Sodium: 143 mmol/L (ref 134–144)

## 2016-08-06 LAB — CBC WITH DIFFERENTIAL/PLATELET
BASOS: 1 %
Basophils Absolute: 0 10*3/uL (ref 0.0–0.2)
EOS (ABSOLUTE): 0.1 10*3/uL (ref 0.0–0.4)
EOS: 2 %
HEMATOCRIT: 42.8 % (ref 37.5–51.0)
HEMOGLOBIN: 14.6 g/dL (ref 13.0–17.7)
IMMATURE GRANS (ABS): 0 10*3/uL (ref 0.0–0.1)
IMMATURE GRANULOCYTES: 0 %
LYMPHS: 29 %
Lymphocytes Absolute: 1.6 10*3/uL (ref 0.7–3.1)
MCH: 30.7 pg (ref 26.6–33.0)
MCHC: 34.1 g/dL (ref 31.5–35.7)
MCV: 90 fL (ref 79–97)
MONOCYTES: 7 %
Monocytes Absolute: 0.4 10*3/uL (ref 0.1–0.9)
NEUTROS PCT: 61 %
Neutrophils Absolute: 3.4 10*3/uL (ref 1.4–7.0)
Platelets: 224 10*3/uL (ref 150–379)
RBC: 4.76 x10E6/uL (ref 4.14–5.80)
RDW: 14.2 % (ref 12.3–15.4)
WBC: 5.6 10*3/uL (ref 3.4–10.8)

## 2016-08-06 LAB — LIPID PANEL
Chol/HDL Ratio: 2.9 ratio units (ref 0.0–5.0)
Cholesterol, Total: 136 mg/dL (ref 100–199)
HDL: 47 mg/dL (ref >39)
LDL CALC: 74 mg/dL (ref 0–99)
Triglycerides: 73 mg/dL (ref 0–149)
VLDL CHOLESTEROL CAL: 15 mg/dL (ref 5–40)

## 2016-08-06 LAB — HEPATIC FUNCTION PANEL
ALBUMIN: 4.4 g/dL (ref 3.6–4.8)
ALK PHOS: 50 IU/L (ref 39–117)
ALT: 23 IU/L (ref 0–44)
AST: 25 IU/L (ref 0–40)
BILIRUBIN, DIRECT: 0.19 mg/dL (ref 0.00–0.40)
Bilirubin Total: 0.6 mg/dL (ref 0.0–1.2)
Total Protein: 7.2 g/dL (ref 6.0–8.5)

## 2016-08-06 LAB — PSA, TOTAL AND FREE
PROSTATE SPECIFIC AG, SERUM: 0.4 ng/mL (ref 0.0–4.0)
PSA, Free Pct: 42.5 %
PSA, Free: 0.17 ng/mL

## 2016-08-06 LAB — VITAMIN D 25 HYDROXY (VIT D DEFICIENCY, FRACTURES): VIT D 25 HYDROXY: 57.7 ng/mL (ref 30.0–100.0)

## 2016-08-07 LAB — URINE CULTURE

## 2016-08-26 ENCOUNTER — Other Ambulatory Visit: Payer: Self-pay | Admitting: Family Medicine

## 2016-09-25 ENCOUNTER — Other Ambulatory Visit: Payer: Self-pay | Admitting: Family Medicine

## 2016-09-26 ENCOUNTER — Encounter: Payer: Self-pay | Admitting: Family Medicine

## 2016-09-26 ENCOUNTER — Ambulatory Visit (INDEPENDENT_AMBULATORY_CARE_PROVIDER_SITE_OTHER): Payer: Medicare Other | Admitting: Family Medicine

## 2016-09-26 VITALS — BP 126/74 | HR 73 | Temp 97.2°F | Ht 69.0 in | Wt 214.0 lb

## 2016-09-26 DIAGNOSIS — R109 Unspecified abdominal pain: Secondary | ICD-10-CM

## 2016-09-26 DIAGNOSIS — R319 Hematuria, unspecified: Secondary | ICD-10-CM

## 2016-09-26 LAB — URINALYSIS, COMPLETE
Bilirubin, UA: NEGATIVE
Glucose, UA: NEGATIVE
KETONES UA: NEGATIVE
Nitrite, UA: NEGATIVE
PH UA: 7 (ref 5.0–7.5)
Protein, UA: NEGATIVE
SPEC GRAV UA: 1.02 (ref 1.005–1.030)
Urobilinogen, Ur: 0.2 mg/dL (ref 0.2–1.0)

## 2016-09-26 LAB — MICROSCOPIC EXAMINATION
Bacteria, UA: NONE SEEN
RENAL EPITHEL UA: NONE SEEN /HPF

## 2016-09-26 NOTE — Progress Notes (Signed)
Subjective:    Patient ID: Roger Palmer, male    DOB: 1950/10/23, 66 y.o.   MRN: [id number]  HPI Patient here today for blood in his urine. The patient does have a history of kidney stones. He has noticed this on several occasions. We'll get a urinalysis today and a urine culture and sensitivity today. The patient was in the hospital several years ago he was told he had several stones in the kidneys. He brings in a record of the occurrences of this pink discolored urine and he is had 4 episodes since May 11. He says he seems to notice it when he gets up in the morning and then sporadically during the day when he first starts to void. He denies any pain other than minimal discomfort in the left like and there is no burning or pain with voiding. He denies any chest pain or shortness of breath. He denies any problems with nausea vomiting diarrhea blood in the stool or black tarry bowel movements. The urinalysis today had 0-5 WBC and 11-30 RBC.    Patient Active Problem List   Diagnosis Date Noted  . Hyperlipidemia 11/09/2013  . 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 High risk medication use 08/14/2012  . 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 Hypernatremia 07/20/2012  . Hypokalemia 07/14/2012  . Nephrolithiasis   . Hypertension   . Back pain   . Obesity   . BPH (benign prostatic hypertrophy)   . Gastritis   . Diabetes mellitus without complication ([place])   . Acid reflux 05/04/2011   Outpatient Encounter Prescriptions as of 09/26/2016  Medication Sig  . atorvastatin (LIPITOR) 40 MG tablet TAKE ONE (1) TABLET EACH DAY  . fenofibrate 160 MG tablet TAKE ONE (1) TABLET EACH DAY  . finasteride (PROSCAR) 5 MG tablet TAKE ONE (1) TABLET EACH DAY  . loratadine (CLARITIN) 10 MG tablet Take 10 mg by mouth as needed for allergies.  Marland Kitchen losartan-hydrochlorothiazide (HYZAAR) 100-12.5 MG tablet TAKE ONE (1) TABLET EACH DAY  . metFORMIN (GLUCOPHAGE-XR) 500 MG  24 hr tablet TAKE 1 TABLET EVERY MORNING WITH BREAKFAST  . omeprazole (PRILOSEC) 20 MG capsule TAKE ONE (1) CAPSULE EACH DAY  . tamsulosin (FLOMAX) 0.4 MG CAPS capsule TAKE ONE (1) CAPSULE EACH DAY  . Vitamin D, Ergocalciferol, (DRISDOL) 50000 units CAPS capsule Take 1 capsule (50,000 Units total) by mouth every 7 (seven) days.   Facility-Administered Encounter Medications as of 09/26/2016  Medication  . 0.9 %  sodium chloride infusion     Review of Systems  Constitutional: Negative.   HENT: Negative.   Eyes: Negative.   Respiratory: Negative.   Cardiovascular: Negative.   Gastrointestinal: Negative.   Endocrine: Negative.   Genitourinary: Positive for hematuria.  Musculoskeletal: Negative.   Skin: Negative.   Allergic/Immunologic: Negative.   Neurological: Negative.   Hematological: Negative.   Psychiatric/Behavioral: Negative.        Objective:   Physical Exam  Constitutional: He is oriented to person, place, and time. He appears well-developed and well-nourished. No distress.  HENT:  Head: Normocephalic.  Eyes: Conjunctivae and EOM are normal. Pupils are equal, round, and reactive to light. Right eye exhibits no discharge. Left eye exhibits no discharge. No scleral icterus.  Neck: Normal range of motion.  Abdominal: Soft. Bowel sounds are normal. He exhibits no distension and no mass. There is no tenderness. There is no rebound and no guarding.  Musculoskeletal: Normal range of motion.  Neurological: He is alert and oriented to person, place, and time.  Skin: Skin is warm and dry. No rash noted.  Psychiatric: He has a normal mood and affect. His behavior is normal. Judgment and thought content normal.  Nursing note and vitals reviewed.  BP 126/74 (BP Location: Left Arm)   Pulse 73   Temp 97.2 F (36.2 C) (Oral)   Ht 5\' 9"  (1.753 m)   Wt 214 lb (97.1 kg)   BMI 31.60 kg/m   We will arrange to get a CT scan renal stone protocol at the hospital. Urinalysis had 10-30  WBC and only 0-5 RBC.     Assessment & Plan:  1. Hematuria -Continue to drink plenty of fluids  2. Left flank pain -Arrange to get renal stone CT scan protocol at Hospital -Patient will continue to drink plenty of fluids -If he decides he needs something for pain he will call us back as he declined this today. -BMP  Patient Instructions  Drink plenty of fluids and stay well hydrated Call back if pain medicine is needed   Arrie Senate MD   .

## 2016-09-26 NOTE — Patient Instructions (Addendum)
Drink plenty of fluids and stay well hydrated Call back if pain medicine is needed Get CT scan with renal stone protocol at [facility] urinalysis early next week

## 2016-09-27 LAB — BMP8+EGFR
BUN / CREAT RATIO: 14 (ref 10–24)
BUN: 16 mg/dL (ref 8–27)
CO2: 24 mmol/L (ref 18–29)
Calcium: 9.6 mg/dL (ref 8.6–10.2)
Chloride: 104 mmol/L (ref 96–106)
Creatinine, Ser: 1.11 mg/dL (ref 0.76–1.27)
GFR calc non Af Amer: 68 mL/min/{1.73_m2} (ref >59)
GFR, EST AFRICAN AMERICAN: 79 mL/min/{1.73_m2} (ref >59)
Glucose: 115 mg/dL — ABNORMAL HIGH (ref 65–99)
POTASSIUM: 4.4 mmol/L (ref 3.5–5.2)
SODIUM: 144 mmol/L (ref 134–144)

## 2016-09-28 LAB — URINE CULTURE: Organism ID, Bacteria: NO GROWTH

## 2016-09-30 ENCOUNTER — Other Ambulatory Visit: Payer: Self-pay

## 2016-09-30 DIAGNOSIS — R319 Hematuria, unspecified: Secondary | ICD-10-CM

## 2016-10-03 ENCOUNTER — Other Ambulatory Visit: Payer: Medicare Other

## 2016-10-03 ENCOUNTER — Telehealth: Payer: Self-pay

## 2016-10-03 ENCOUNTER — Ambulatory Visit (HOSPITAL_COMMUNITY)
Admission: RE | Admit: 2016-10-03 | Discharge: 2016-10-03 | Disposition: A | Discharge status: Home / Self Care | Payer: Medicare Other | Source: Ambulatory Visit | Attending: Family Medicine | Admitting: Family Medicine

## 2016-10-03 ENCOUNTER — Other Ambulatory Visit: Payer: Self-pay | Admitting: *Deleted

## 2016-10-03 DIAGNOSIS — R319 Hematuria, unspecified: Secondary | ICD-10-CM

## 2016-10-03 DIAGNOSIS — N200 Calculus of kidney: Secondary | ICD-10-CM | POA: Diagnosis not present

## 2016-10-03 DIAGNOSIS — R160 Hepatomegaly, not elsewhere classified: Secondary | ICD-10-CM | POA: Diagnosis not present

## 2016-10-03 DIAGNOSIS — R932 Abnormal findings on diagnostic imaging of liver and biliary tract: Secondary | ICD-10-CM

## 2016-10-03 DIAGNOSIS — N400 Enlarged prostate without lower urinary tract symptoms: Secondary | ICD-10-CM | POA: Diagnosis not present

## 2016-10-03 DIAGNOSIS — K8020 Calculus of gallbladder without cholecystitis without obstruction: Secondary | ICD-10-CM | POA: Insufficient documentation

## 2016-10-03 LAB — MICROSCOPIC EXAMINATION
BACTERIA UA: NONE SEEN
RBC, UA: NONE SEEN /hpf (ref 0–?)
Renal Epithel, UA: NONE SEEN /hpf

## 2016-10-03 LAB — URINALYSIS, COMPLETE
Bilirubin, UA: NEGATIVE
GLUCOSE, UA: NEGATIVE
Nitrite, UA: NEGATIVE
PROTEIN UA: NEGATIVE
RBC, UA: NEGATIVE
Specific Gravity, UA: >1.03 — ABNORMAL HIGH (ref 1.005–1.030)
UUROB: 0.2 mg/dL (ref 0.2–1.0)
pH, UA: 6 (ref 5.0–7.5)

## 2016-10-03 NOTE — Telephone Encounter (Signed)
We have the report = printed and on [facility] desk for review

## 2016-10-03 NOTE — Telephone Encounter (Signed)
[facility] radiology called and wanted to make sure we can view and [facility] reviews pt's CT renal stone. Please review

## 2016-10-04 ENCOUNTER — Ambulatory Visit (HOSPITAL_COMMUNITY)
Admission: RE | Admit: 2016-10-04 | Discharge: 2016-10-04 | Disposition: A | Discharge status: Home / Self Care | Payer: Medicare Other | Source: Ambulatory Visit | Attending: Family Medicine | Admitting: Family Medicine

## 2016-10-04 DIAGNOSIS — K760 Fatty (change of) liver, not elsewhere classified: Secondary | ICD-10-CM | POA: Diagnosis not present

## 2016-10-04 DIAGNOSIS — R160 Hepatomegaly, not elsewhere classified: Secondary | ICD-10-CM | POA: Insufficient documentation

## 2016-10-04 DIAGNOSIS — D134 Benign neoplasm of liver: Secondary | ICD-10-CM | POA: Diagnosis not present

## 2016-10-04 DIAGNOSIS — R932 Abnormal findings on diagnostic imaging of liver and biliary tract: Secondary | ICD-10-CM | POA: Diagnosis not present

## 2016-10-04 LAB — URINE CULTURE: Organism ID, Bacteria: NO GROWTH

## 2016-10-04 MED ORDER — GADOXETATE DISODIUM 0.25 MMOL/ML IV SOLN
10.0000 mL | Freq: Once | INTRAVENOUS | Status: AC | PRN
  Administered 2016-10-04: 10 mL via INTRAVENOUS

## 2016-10-04 MED ORDER — GADOBENATE DIMEGLUMINE 529 MG/ML IV SOLN: 20.0000 mL | Freq: Once | INTRAVENOUS | Status: DC | PRN

## 2016-10-29 ENCOUNTER — Other Ambulatory Visit: Payer: Self-pay | Admitting: Family Medicine

## 2016-11-25 ENCOUNTER — Other Ambulatory Visit: Payer: Self-pay | Admitting: Family Medicine

## 2016-12-24 ENCOUNTER — Ambulatory Visit (INDEPENDENT_AMBULATORY_CARE_PROVIDER_SITE_OTHER): Payer: Medicare Other

## 2016-12-24 ENCOUNTER — Encounter: Payer: Self-pay | Admitting: Family Medicine

## 2016-12-24 ENCOUNTER — Ambulatory Visit (INDEPENDENT_AMBULATORY_CARE_PROVIDER_SITE_OTHER): Payer: Medicare Other | Admitting: Family Medicine

## 2016-12-24 VITALS — BP 125/78 | HR 83 | Temp 97.4°F | Ht 69.0 in | Wt 213.0 lb

## 2016-12-24 DIAGNOSIS — E7800 Pure hypercholesterolemia, unspecified: Secondary | ICD-10-CM | POA: Diagnosis not present

## 2016-12-24 DIAGNOSIS — K760 Fatty (change of) liver, not elsewhere classified: Secondary | ICD-10-CM | POA: Diagnosis not present

## 2016-12-24 DIAGNOSIS — I10 Essential (primary) hypertension: Secondary | ICD-10-CM

## 2016-12-24 DIAGNOSIS — K219 Gastro-esophageal reflux disease without esophagitis: Secondary | ICD-10-CM

## 2016-12-24 DIAGNOSIS — E119 Type 2 diabetes mellitus without complications: Secondary | ICD-10-CM | POA: Diagnosis not present

## 2016-12-24 DIAGNOSIS — E559 Vitamin D deficiency, unspecified: Secondary | ICD-10-CM

## 2016-12-24 DIAGNOSIS — E1141 Type 2 diabetes mellitus with diabetic mononeuropathy: Secondary | ICD-10-CM | POA: Diagnosis not present

## 2016-12-24 DIAGNOSIS — N200 Calculus of kidney: Secondary | ICD-10-CM | POA: Diagnosis not present

## 2016-12-24 DIAGNOSIS — N400 Enlarged prostate without lower urinary tract symptoms: Secondary | ICD-10-CM

## 2016-12-24 LAB — URINALYSIS, COMPLETE
Bilirubin, UA: NEGATIVE
GLUCOSE, UA: NEGATIVE
Ketones, UA: NEGATIVE
NITRITE UA: NEGATIVE
Protein, UA: NEGATIVE
RBC, UA: NEGATIVE
SPEC GRAV UA: 1.02 (ref 1.005–1.030)
Urobilinogen, Ur: 0.2 mg/dL (ref 0.2–1.0)
pH, UA: 8.5 — ABNORMAL HIGH (ref 5.0–7.5)

## 2016-12-24 LAB — MICROSCOPIC EXAMINATION
BACTERIA UA: NONE SEEN
EPITHELIAL CELLS (NON RENAL): NONE SEEN /HPF (ref 0–10)
RBC, UA: NONE SEEN /hpf (ref 0–?)
RENAL EPITHEL UA: NONE SEEN /HPF

## 2016-12-24 LAB — BAYER DCA HB A1C WAIVED: HB A1C: 7.1 % — AB (ref <7.0)

## 2016-12-24 NOTE — Progress Notes (Signed)
Subjective:    Patient ID: Roger Palmer, male    DOB: 06/29/1950, 66 y.o.   MRN: [id number]  HPI  Pt here for follow up and management of chronic medical problems which includes hyperlipidemia and hypertension. He is taking medications regularly.The patient is doing well overall. He has had a recent kidney stone but this is better. He is due to get a chest x-ray today and some lab work we will also check a urinalysis if he has not had one recently because of the kidney stone. His vital signs are stable. The most recent MRI reports were reviewed in the exam room with the patient indicating small gallstones and small kidney stones on the left side without hydronephrosis. There was concern about a liver lesion which on a noncontrast CT indicated was a fatty liver. The pancreas was also normal. This was reviewed with the patient he was given copies of all these reports. The plan today as far as the kidney stone is concerned since the patient is currently asymptomatic will be to check his BMP and a urinalysis and is always the BMP is good and the urine is clear of red blood cells we'll continue to monitor this. Otherwise we'll have him go see the urologist. The patient denies any chest pain or shortness of breath. He denies any problems with swallowing heartburn indigestion nausea vomiting diarrhea or blood in the stool. No change in bowel habits. He's passing his water as usual without problems and he has not seen any blood for a couple of months.     Patient Active Problem List   Diagnosis Date Noted  . Hyperlipidemia 11/09/2013  . 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 High risk medication use 08/14/2012  . 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 Hypernatremia 07/20/2012  . Hypokalemia 07/14/2012  . Nephrolithiasis   . Hypertension   . Back pain   . Obesity   . BPH (benign prostatic hypertrophy)   . Gastritis   . Diabetes mellitus without  complication ([place])   . Acid reflux 05/04/2011   Outpatient Encounter Prescriptions as of 12/24/2016  Medication Sig  . atorvastatin (LIPITOR) 40 MG tablet TAKE ONE (1) TABLET EACH DAY  . fenofibrate 160 MG tablet TAKE ONE (1) TABLET EACH DAY  . finasteride (PROSCAR) 5 MG tablet TAKE ONE (1) TABLET EACH DAY  . loratadine (CLARITIN) 10 MG tablet Take 10 mg by mouth as needed for allergies.  Marland Kitchen losartan-hydrochlorothiazide (HYZAAR) 100-12.5 MG tablet TAKE ONE (1) TABLET EACH DAY  . metFORMIN (GLUCOPHAGE-XR) 500 MG 24 hr tablet TAKE 1 TABLET EVERY MORNING WITH BREAKFAST  . omeprazole (PRILOSEC) 20 MG capsule TAKE ONE (1) CAPSULE EACH DAY  . tamsulosin (FLOMAX) 0.4 MG CAPS capsule TAKE ONE (1) CAPSULE EACH DAY  . Vitamin D, Ergocalciferol, (DRISDOL) 50000 units CAPS capsule Take 1 capsule (50,000 Units total) by mouth every 7 (seven) days.   Facility-Administered Encounter Medications as of 12/24/2016  Medication  . 0.9 %  sodium chloride infusion      Review of Systems  Constitutional: Negative.   HENT: Negative.   Eyes: Negative.   Respiratory: Negative.   Cardiovascular: Negative.   Gastrointestinal: Negative.   Endocrine: Negative.   Genitourinary: Negative.   Musculoskeletal: Negative.   Skin: Negative.   Allergic/Immunologic: Negative.   Neurological: Negative.   Hematological: Negative.   Psychiatric/Behavioral: Negative.        Objective:   Physical Exam  Constitutional:  He is oriented to person, place, and time. He appears well-developed and well-nourished. No distress.  The patient is calm and pleasant and relaxed and having no problems medically that he can discuss with me.  HENT:  Head: Normocephalic and atraumatic.  Right Ear: External ear normal.  Left Ear: External ear normal.  Mouth/Throat: Oropharynx is clear and moist. No oropharyngeal exudate.  Nasal congestion and turbinate swelling bilaterally left greater than right  Eyes: Pupils are equal, round, and  reactive to light. Conjunctivae and EOM are normal. Right eye exhibits no discharge. Left eye exhibits no discharge. No scleral icterus.  The last eye exam was in May 2017. This was done at [facility].  Neck: Normal range of motion. Neck supple. No thyromegaly present.  No bruits thyromegaly or anterior cervical adenopathy  Cardiovascular: Normal rate, regular rhythm, normal heart sounds and intact distal pulses.  Exam reveals no gallop and no friction rub.   No murmur heard. Heart has a regular rate and rhythm at 72/m  Pulmonary/Chest: Effort normal and breath sounds normal. No respiratory distress. He has no wheezes. He has no rales. He exhibits no tenderness.  The chest was clear anteriorly and posteriorly there are no axillary adenopathy  Abdominal: Soft. Bowel sounds are normal. He exhibits no mass. There is no tenderness. There is no rebound and no guarding.  Mild abdominal obesity without liver or spleen enlargement epigastric tenderness or inguinal adenopathy. No masses.  Musculoskeletal: Normal range of motion. He exhibits no edema.  Lymphadenopathy:    He has no cervical adenopathy.  Neurological: He is alert and oriented to person, place, and time. He has normal reflexes. No cranial nerve deficit.  Skin: Skin is warm and dry. No rash noted.  Psychiatric: He has a normal mood and affect. His behavior is normal. Judgment and thought content normal.  Nursing note and vitals reviewed.  BP 125/78 (BP Location: Left Arm)   Pulse 83   Temp (!) 97.4 F (36.3 C) (Oral)   Ht '5\' 9"'$  (1.753 m)   Wt 213 lb (96.6 kg)   BMI 31.45 kg/m         Assessment & Plan:  1. Essential hypertension -The blood pressure is good today and patient will continue his current treatment along with efforts at weight loss and sodium restriction - CBC with Differential/Platelet - BMP8+EGFR - Hepatic function panel - DG Chest 2 View; Future  2. Pure hypercholesterolemia -Continue current treatment pending  results of lab work - CBC with Differential/Platelet - Lipid panel - DG Chest 2 View; Future  3. Vitamin D deficiency -Continue current treatment pending results of lab work - CBC with Differential/Platelet - VITAMIN D 25 Hydroxy (Vit-D Deficiency, Fractures)  4. Diabetes mellitus without complication ([place]) -Blood sugars have been running between 140-150 both fasting and during the day when they're checked according to the patient. We will make no changes with his treatment regimen pending results of lab work - CBC with Differential/Platelet - Bayer Lucas Hb A1c Waived  5. Gastroesophageal reflux disease, esophagitis presence not specified This is currently under good control with taking omeprazole. - CBC with Differential/Platelet  6. Benign prostatic hyperplasia, unspecified whether lower urinary tract symptoms present -The patient has a history of an enlarged prostate but he has no complaints with voiding. - CBC with Differential/Platelet  7. Nephrolithiasis -He has had some left flank pain but this has been absent for a good while and when he thought he was passing the stone in 2 weeks  after they did have an episode of hematuria but not any since. We will continue to monitor this. - Urinalysis, Complete - Urine Culture  8. Diabetic mononeuropathy associated with type 2 diabetes mellitus ([place]) -No complaints with this today.  9. Fatty liver -The recent MRI and CT scans evaluating for kidney stone and an unknown suspected mass in the liver which turned out to be a fatty tumor were reviewed with the patient during the visit today he was given a copy of this. It does show that he has gallstones present with out cholecystitis. He still has small stones in the left kidney.  Patient Instructions                       Medicare Annual Wellness Visit  [facility] and the medical providers at [facility] strive to bring you the best medical care.  In doing so we not  only want to address your current medical conditions and concerns but also to detect new conditions early and prevent illness, disease and health-related problems.    Medicare offers a yearly Wellness Visit which allows our clinical staff to assess your need for preventative services including immunizations, lifestyle education, counseling to decrease risk of preventable diseases and screening for fall risk and other medical concerns.    This visit is provided free of charge (no copay) for all Medicare recipients. The clinical pharmacists at [facility] have begun to conduct these Wellness Visits which will also include a thorough review of all your medications.    As you primary medical provider recommend that you make an appointment for your Annual Wellness Visit if you have not done so already this year.  You may set up this appointment before you leave today or you may call back ([phone]) and schedule an appointment.  Please make sure when you call that you mention that you are scheduling your Annual Wellness Visit with the clinical pharmacist so that the appointment may be made for the proper length of time.     Continue current medications. Continue good therapeutic lifestyle changes which include good diet and exercise. Fall precautions discussed with patient. If an FOBT was given today- please return it to our front desk. If you are over 10 years old - you may need Prevnar 84 or the adult Pneumonia vaccine.  **Flu shots are available--- please call and schedule a FLU-CLINIC appointment**  After your visit with [place] today you will receive a survey in the mail or online from [employer] regarding your care with [place]. Please take a moment to fill this out. Your feedback is very important to [place] as you can help [place] better understand your patient needs as well as improve your experience and satisfaction. WE CARE ABOUT YOU!!!   We will call with results of lab work as soon as  they become available as well as the urinalysis We'll also call with results of the chest x-ray Continue to avoid all climbing! Stay active physically and drink more water and make every effort to lose as much weight with diet and exercise as you can   Arrie Senate MD

## 2016-12-24 NOTE — Patient Instructions (Addendum)
Medicare Annual Wellness Visit  [facility] and the medical providers at [facility] strive to bring you the best medical care.  In doing so we not only want to address your current medical conditions and concerns but also to detect new conditions early and prevent illness, disease and health-related problems.    Medicare offers a yearly Wellness Visit which allows our clinical staff to assess your need for preventative services including immunizations, lifestyle education, counseling to decrease risk of preventable diseases and screening for fall risk and other medical concerns.    This visit is provided free of charge (no copay) for all Medicare recipients. The clinical pharmacists at [facility] have begun to conduct these Wellness Visits which will also include a thorough review of all your medications.    As you primary medical provider recommend that you make an appointment for your Annual Wellness Visit if you have not done so already this year.  You may set up this appointment before you leave today or you may call back ([phone]) and schedule an appointment.  Please make sure when you call that you mention that you are scheduling your Annual Wellness Visit with the clinical pharmacist so that the appointment may be made for the proper length of time.     Continue current medications. Continue good therapeutic lifestyle changes which include good diet and exercise. Fall precautions discussed with patient. If an FOBT was given today- please return it to our front desk. If you are over 2 years old - you may need Prevnar 43 or the adult Pneumonia vaccine.  **Flu shots are available--- please call and schedule a FLU-CLINIC appointment**  After your visit with [place] today you will receive a survey in the mail or online from [employer] regarding your care with [place]. Please take a moment to fill this out. Your feedback is very  important to [place] as you can help [place] better understand your patient needs as well as improve your experience and satisfaction. WE CARE ABOUT YOU!!!   We will call with results of lab work as soon as they become available as well as the urinalysis We'll also call with results of the chest x-ray Continue to avoid all climbing! Stay active physically and drink more water and make every effort to lose as much weight with diet and exercise as you can

## 2016-12-25 ENCOUNTER — Other Ambulatory Visit: Payer: Self-pay | Admitting: Family Medicine

## 2016-12-25 LAB — HEPATIC FUNCTION PANEL
ALT: 19 IU/L (ref 0–44)
AST: 21 IU/L (ref 0–40)
Albumin: 4.4 g/dL (ref 3.6–4.8)
Alkaline Phosphatase: 46 IU/L (ref 39–117)
BILIRUBIN, DIRECT: 0.17 mg/dL (ref 0.00–0.40)
Bilirubin Total: 0.5 mg/dL (ref 0.0–1.2)
TOTAL PROTEIN: 6.6 g/dL (ref 6.0–8.5)

## 2016-12-25 LAB — BMP8+EGFR
BUN / CREAT RATIO: 12 (ref 10–24)
BUN: 14 mg/dL (ref 8–27)
CALCIUM: 9.6 mg/dL (ref 8.6–10.2)
CHLORIDE: 108 mmol/L — AB (ref 96–106)
CO2: 24 mmol/L (ref 20–29)
CREATININE: 1.13 mg/dL (ref 0.76–1.27)
GFR calc Af Amer: 77 mL/min/{1.73_m2} (ref >59)
GFR calc non Af Amer: 67 mL/min/{1.73_m2} (ref >59)
GLUCOSE: 146 mg/dL — AB (ref 65–99)
Potassium: 3.8 mmol/L (ref 3.5–5.2)
Sodium: 145 mmol/L — ABNORMAL HIGH (ref 134–144)

## 2016-12-25 LAB — CBC WITH DIFFERENTIAL/PLATELET
BASOS ABS: 0 10*3/uL (ref 0.0–0.2)
Basos: 1 %
EOS (ABSOLUTE): 0.2 10*3/uL (ref 0.0–0.4)
EOS: 3 %
HEMOGLOBIN: 14 g/dL (ref 13.0–17.7)
Hematocrit: 41.5 % (ref 37.5–51.0)
IMMATURE GRANS (ABS): 0 10*3/uL (ref 0.0–0.1)
IMMATURE GRANULOCYTES: 0 %
Lymphocytes Absolute: 1.9 10*3/uL (ref 0.7–3.1)
Lymphs: 31 %
MCH: 30.8 pg (ref 26.6–33.0)
MCHC: 33.7 g/dL (ref 31.5–35.7)
MCV: 91 fL (ref 79–97)
MONOCYTES: 6 %
Monocytes Absolute: 0.4 10*3/uL (ref 0.1–0.9)
NEUTROS PCT: 59 %
Neutrophils Absolute: 3.6 10*3/uL (ref 1.4–7.0)
PLATELETS: 201 10*3/uL (ref 150–379)
RBC: 4.55 x10E6/uL (ref 4.14–5.80)
RDW: 14 % (ref 12.3–15.4)
WBC: 6 10*3/uL (ref 3.4–10.8)

## 2016-12-25 LAB — URINE CULTURE

## 2016-12-25 LAB — LIPID PANEL
CHOLESTEROL TOTAL: 122 mg/dL (ref 100–199)
Chol/HDL Ratio: 2.7 ratio (ref 0.0–5.0)
HDL: 45 mg/dL (ref >39)
LDL CALC: 63 mg/dL (ref 0–99)
TRIGLYCERIDES: 69 mg/dL (ref 0–149)
VLDL CHOLESTEROL CAL: 14 mg/dL (ref 5–40)

## 2016-12-25 LAB — VITAMIN D 25 HYDROXY (VIT D DEFICIENCY, FRACTURES): Vit D, 25-Hydroxy: 43.5 ng/mL (ref 30.0–100.0)

## 2017-01-21 ENCOUNTER — Other Ambulatory Visit: Payer: Self-pay | Admitting: Family Medicine

## 2017-02-18 ENCOUNTER — Other Ambulatory Visit: Payer: Self-pay | Admitting: Family Medicine

## 2017-03-11 ENCOUNTER — Other Ambulatory Visit: Payer: Self-pay | Admitting: Family Medicine

## 2017-04-24 ENCOUNTER — Other Ambulatory Visit: Payer: Self-pay | Admitting: Family Medicine

## 2017-04-29 ENCOUNTER — Encounter: Payer: Self-pay | Admitting: Family Medicine

## 2017-04-29 ENCOUNTER — Ambulatory Visit (INDEPENDENT_AMBULATORY_CARE_PROVIDER_SITE_OTHER): Payer: Medicare Other | Admitting: Family Medicine

## 2017-04-29 VITALS — BP 127/73 | HR 78 | Temp 97.1°F | Ht 69.0 in | Wt 221.0 lb

## 2017-04-29 DIAGNOSIS — E7800 Pure hypercholesterolemia, unspecified: Secondary | ICD-10-CM

## 2017-04-29 DIAGNOSIS — E119 Type 2 diabetes mellitus without complications: Secondary | ICD-10-CM | POA: Diagnosis not present

## 2017-04-29 DIAGNOSIS — I10 Essential (primary) hypertension: Secondary | ICD-10-CM

## 2017-04-29 DIAGNOSIS — K219 Gastro-esophageal reflux disease without esophagitis: Secondary | ICD-10-CM | POA: Diagnosis not present

## 2017-04-29 DIAGNOSIS — Z23 Encounter for immunization: Secondary | ICD-10-CM | POA: Diagnosis not present

## 2017-04-29 DIAGNOSIS — R319 Hematuria, unspecified: Secondary | ICD-10-CM

## 2017-04-29 DIAGNOSIS — N400 Enlarged prostate without lower urinary tract symptoms: Secondary | ICD-10-CM

## 2017-04-29 DIAGNOSIS — E559 Vitamin D deficiency, unspecified: Secondary | ICD-10-CM | POA: Diagnosis not present

## 2017-04-29 LAB — BAYER DCA HB A1C WAIVED: HB A1C (BAYER DCA - WAIVED): 7.4 % — ABNORMAL HIGH (ref <7.0)

## 2017-04-29 NOTE — Progress Notes (Signed)
Subjective:    Patient ID: Roger Palmer, male    DOB: 04-09-1951, 66 y.o.   MRN: [id number]  HPI Pt here for follow up and management of chronic medical problems which includes diabetes, hyperlipidemia and hypertension. He is taking medication regularly.  The patient is doing well as usual has no complaints.  He does not need any refills.  He will get his flu shot today.  He will be given an FOBT to return and will get his lab work.  His vital signs are stable his weight is up about 8 pounds.  She has been caring for his wife and has not had opportunity to exercise as much as he should.  He denies any chest pain or shortness of breath.  He denies any trouble with nausea vomiting diarrhea blood in the stool or black tarry bowel movements.  He is passing his water okay and does have a history of nephrolithiasis.  He is not sure when he saw the urologist last.  He has on a couple of occasions seen some blood in the urine.  He will get his flu shot today.     Patient Active Problem List   Diagnosis Date Noted  . Hyperlipidemia 11/09/2013  . 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 High risk medication use 08/14/2012  . 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) 07/24/2012  . Hypernatremia 07/20/2012  . Hypokalemia 07/14/2012  . Nephrolithiasis   . Hypertension   . Back pain   . Obesity   . BPH (benign prostatic hypertrophy)   . Gastritis   . Diabetes mellitus without complication ([place])   . Acid reflux 05/04/2011   Outpatient Encounter Medications as of 04/29/2017  Medication Sig  . atorvastatin (LIPITOR) 40 MG tablet TAKE ONE (1) TABLET EACH DAY  . fenofibrate 160 MG tablet TAKE ONE (1) TABLET EACH DAY  . finasteride (PROSCAR) 5 MG tablet TAKE ONE (1) TABLET EACH DAY  . loratadine (CLARITIN) 10 MG tablet Take 10 mg by mouth as needed for allergies.  Marland Kitchen losartan-hydrochlorothiazide (HYZAAR) 100-12.5 MG tablet TAKE ONE (1) TABLET EACH DAY  . metFORMIN  (GLUCOPHAGE-XR) 500 MG 24 hr tablet TAKE 1 TABLET EVERY MORNING WITH BREAKFAST  . omeprazole (PRILOSEC) 20 MG capsule TAKE ONE (1) CAPSULE EACH DAY  . tamsulosin (FLOMAX) 0.4 MG CAPS capsule TAKE ONE (1) CAPSULE EACH DAY  . Vitamin D, Ergocalciferol, (DRISDOL) 50000 units CAPS capsule TAKE 1 CAPSULE EVERY WEEK  . [DISCONTINUED] 0.9 %  sodium chloride infusion    No facility-administered encounter medications on file as of 04/29/2017.      Review of Systems  Constitutional: Negative.   HENT: Negative.   Eyes: Negative.   Respiratory: Negative.   Cardiovascular: Negative.   Gastrointestinal: Negative.   Endocrine: Negative.   Genitourinary: Negative.   Musculoskeletal: Negative.   Skin: Negative.   Allergic/Immunologic: Negative.   Neurological: Negative.   Hematological: Negative.   Psychiatric/Behavioral: Negative.        Objective:   Physical Exam  Constitutional: He is oriented to person, place, and time. He appears well-developed and well-nourished. No distress.  The patient is pleasant and relaxed.  HENT:  Head: Normocephalic and atraumatic.  Right Ear: External ear normal.  Left Ear: External ear normal.  Mouth/Throat: Oropharynx is clear and moist. No oropharyngeal exudate.  Nasal congestion left greater than right  Eyes: Conjunctivae and EOM are normal. Pupils are equal, round, and reactive to light. Right eye exhibits  no discharge. Left eye exhibits no discharge. No scleral icterus.  Neck: Normal range of motion. Neck supple. No thyromegaly present.  No bruits thyromegaly or anterior cervical adenopathy  Cardiovascular: Normal rate, regular rhythm, normal heart sounds and intact distal pulses.  No murmur heard. Heart has a regular rate and rhythm at 72/min  Pulmonary/Chest: Effort normal and breath sounds normal. No respiratory distress. He has no wheezes. He has no rales. He exhibits no tenderness.  Clear anteriorly and posteriorly and no axillary no chest wall  masses.  Abdominal: Soft. Bowel sounds are normal. He exhibits no mass. There is no tenderness. There is no rebound and no guarding.  Obesity without masses tenderness or organ enlargement or bruits and no inguinal adenopathy  Musculoskeletal: Normal range of motion. He exhibits no edema.  Lymphadenopathy:    He has no cervical adenopathy.  Neurological: He is alert and oriented to person, place, and time. He has normal reflexes. No cranial nerve deficit.  Skin: Skin is warm and dry. No rash noted.  Psychiatric: He has a normal mood and affect. His behavior is normal. Judgment and thought content normal.  Nursing note and vitals reviewed.  BP 127/73 (BP Location: Left Arm)   Pulse 78   Temp (!) 97.1 F (36.2 C) (Oral)   Ht '5\' 9"'$  (1.753 m)   Wt 221 lb (100.2 kg)   BMI 32.64 kg/m         Assessment & Plan:  1. Essential hypertension -The blood pressure is good today and he will continue with current treatment - BMP8+EGFR - CBC with Differential/Platelet - Hepatic function panel  2. Pure hypercholesterolemia -Continue with aggressive therapeutic lifestyle changes and current medicines of atorvastatin and fenofibrate pending results of lab work along with aggressive therapeutic lifestyle changes - CBC with Differential/Platelet - Lipid panel  3. Vitamin D deficiency -Continue vitamin D replacement pending results of lab work - CBC with Differential/Platelet - VITAMIN D 25 Hydroxy (Vit-D Deficiency, Fractures)  4. Diabetes mellitus without complication ([place]) -Try to make an effort to check blood sugars more regularly and tried to be more proactive with diet and exercise - BMP8+EGFR - CBC with Differential/Platelet - Bayer DCA Hb A1c Waived  5. Gastroesophageal reflux disease, esophagitis presence not specified -Continue with diet of avoidance and omeprazole as patient has no complaints with symptoms today. - CBC with Differential/Platelet  6. Benign prostatic  hyperplasia, unspecified whether lower urinary tract symptoms present -No voiding symptoms. - CBC with Differential/Platelet  7. Hematuria of undiagnosed cause -The patient does have a history of nephrolithiasis.  He has seen some bright red blood in the urine on occasions.  He has not seen the urologist for a good while.  We will make an appointment for him to see Dr. Lawerance Bach in [place].  Patient Instructions                       Medicare Annual Wellness Visit  [facility] and the medical providers at [facility] strive to bring you the best medical care.  In doing so we not only want to address your current medical conditions and concerns but also to detect new conditions early and prevent illness, disease and health-related problems.    Medicare offers a yearly Wellness Visit which allows our clinical staff to assess your need for preventative services including immunizations, lifestyle education, counseling to decrease risk of preventable diseases and screening for fall risk and other medical concerns.  This visit is provided free of charge (no copay) for all Medicare recipients. The clinical pharmacists at [facility] have begun to conduct these Wellness Visits which will also include a thorough review of all your medications.    As you primary medical provider recommend that you make an appointment for your Annual Wellness Visit if you have not done so already this year.  You may set up this appointment before you leave today or you may call back ([phone]) and schedule an appointment.  Please make sure when you call that you mention that you are scheduling your Annual Wellness Visit with the clinical pharmacist so that the appointment may be made for the proper length of time.     Continue current medications. Continue good therapeutic lifestyle changes which include good diet and exercise. Fall precautions discussed with patient. If  an FOBT was given today- please return it to our front desk. If you are over 64 years old - you may need Prevnar 30 or the adult Pneumonia vaccine.  **Flu shots are available--- please call and schedule a FLU-CLINIC appointment**  After your visit with [place] today you will receive a survey in the mail or online from [employer] regarding your care with [place]. Please take a moment to fill this out. Your feedback is very important to [place] as you can help [place] better understand your patient needs as well as improve your experience and satisfaction. WE CARE ABOUT YOU!!!   We will arrange for you to have an appointment with a urologist because of these episodes of hematuria that you have been having If you do not hear from [place] within a week to 10 days please call us back about this appointment Try to check the blood sugars a few times each month so we have a gauge as to what your sugars are doing fasting Make every effort possible to lose some weight and find time to get some exercise so that you can remain healthy as we would want you to be.  Arrie Senate MD

## 2017-04-29 NOTE — Patient Instructions (Addendum)
Medicare Annual Wellness Visit  [facility] and the medical providers at [facility] strive to bring you the best medical care.  In doing so we not only want to address your current medical conditions and concerns but also to detect new conditions early and prevent illness, disease and health-related problems.    Medicare offers a yearly Wellness Visit which allows our clinical staff to assess your need for preventative services including immunizations, lifestyle education, counseling to decrease risk of preventable diseases and screening for fall risk and other medical concerns.    This visit is provided free of charge (no copay) for all Medicare recipients. The clinical pharmacists at [facility] have begun to conduct these Wellness Visits which will also include a thorough review of all your medications.    As you primary medical provider recommend that you make an appointment for your Annual Wellness Visit if you have not done so already this year.  You may set up this appointment before you leave today or you may call back ([phone]) and schedule an appointment.  Please make sure when you call that you mention that you are scheduling your Annual Wellness Visit with the clinical pharmacist so that the appointment may be made for the proper length of time.     Continue current medications. Continue good therapeutic lifestyle changes which include good diet and exercise. Fall precautions discussed with patient. If an FOBT was given today- please return it to our front desk. If you are over 10 years old - you may need Prevnar 19 or the adult Pneumonia vaccine.  **Flu shots are available--- please call and schedule a FLU-CLINIC appointment**  After your visit with [place] today you will receive a survey in the mail or online from [employer] regarding your care with [place]. Please take a moment to fill this out. Your feedback is very  important to [place] as you can help [place] better understand your patient needs as well as improve your experience and satisfaction. WE CARE ABOUT YOU!!!   We will arrange for you to have an appointment with a urologist because of these episodes of hematuria that you have been having If you do not hear from [place] within a week to 10 days please call us back about this appointment Try to check the blood sugars a few times each month so we have a gauge as to what your sugars are doing fasting Make every effort possible to lose some weight and find time to get some exercise so that you can remain healthy as we would want you to be.

## 2017-04-30 LAB — LIPID PANEL
Chol/HDL Ratio: 2.8 ratio (ref 0.0–5.0)
Cholesterol, Total: 134 mg/dL (ref 100–199)
HDL: 48 mg/dL (ref >39)
LDL Calculated: 71 mg/dL (ref 0–99)
Triglycerides: 73 mg/dL (ref 0–149)
VLDL Cholesterol Cal: 15 mg/dL (ref 5–40)

## 2017-04-30 LAB — BMP8+EGFR
BUN / CREAT RATIO: 13 (ref 10–24)
BUN: 17 mg/dL (ref 8–27)
CO2: 25 mmol/L (ref 20–29)
Calcium: 10 mg/dL (ref 8.6–10.2)
Chloride: 108 mmol/L — ABNORMAL HIGH (ref 96–106)
Creatinine, Ser: 1.27 mg/dL (ref 0.76–1.27)
GFR calc Af Amer: 67 mL/min/{1.73_m2} (ref >59)
GFR calc non Af Amer: 58 mL/min/{1.73_m2} — ABNORMAL LOW (ref >59)
GLUCOSE: 149 mg/dL — AB (ref 65–99)
POTASSIUM: 4.4 mmol/L (ref 3.5–5.2)
SODIUM: 146 mmol/L — AB (ref 134–144)

## 2017-04-30 LAB — CBC WITH DIFFERENTIAL/PLATELET
BASOS: 1 %
Basophils Absolute: 0 10*3/uL (ref 0.0–0.2)
EOS (ABSOLUTE): 0.2 10*3/uL (ref 0.0–0.4)
Eos: 4 %
Hematocrit: 43.3 % (ref 37.5–51.0)
Hemoglobin: 14.2 g/dL (ref 13.0–17.7)
IMMATURE GRANS (ABS): 0 10*3/uL (ref 0.0–0.1)
Immature Granulocytes: 0 %
LYMPHS ABS: 1.8 10*3/uL (ref 0.7–3.1)
Lymphs: 28 %
MCH: 30.1 pg (ref 26.6–33.0)
MCHC: 32.8 g/dL (ref 31.5–35.7)
MCV: 92 fL (ref 79–97)
MONOS ABS: 0.5 10*3/uL (ref 0.1–0.9)
Monocytes: 9 %
NEUTROS ABS: 3.6 10*3/uL (ref 1.4–7.0)
Neutrophils: 58 %
PLATELETS: 209 10*3/uL (ref 150–379)
RBC: 4.71 x10E6/uL (ref 4.14–5.80)
RDW: 13.5 % (ref 12.3–15.4)
WBC: 6.2 10*3/uL (ref 3.4–10.8)

## 2017-04-30 LAB — HEPATIC FUNCTION PANEL
ALT: 25 IU/L (ref 0–44)
AST: 26 IU/L (ref 0–40)
Albumin: 4.4 g/dL (ref 3.6–4.8)
Alkaline Phosphatase: 52 IU/L (ref 39–117)
BILIRUBIN, DIRECT: 0.18 mg/dL (ref 0.00–0.40)
Bilirubin Total: 0.6 mg/dL (ref 0.0–1.2)
Total Protein: 6.8 g/dL (ref 6.0–8.5)

## 2017-04-30 LAB — VITAMIN D 25 HYDROXY (VIT D DEFICIENCY, FRACTURES): VIT D 25 HYDROXY: 43.7 ng/mL (ref 30.0–100.0)

## 2017-04-30 NOTE — Addendum Note (Signed)
Addended by: Zannie Cove on: 04/30/2017 09:23 AM   Modules accepted: Orders

## 2017-05-05 ENCOUNTER — Other Ambulatory Visit: Payer: Self-pay | Admitting: Family Medicine

## 2017-05-27 ENCOUNTER — Other Ambulatory Visit: Payer: Self-pay | Admitting: Family Medicine

## 2017-06-13 DIAGNOSIS — R310 Gross hematuria: Secondary | ICD-10-CM | POA: Diagnosis not present

## 2017-06-13 DIAGNOSIS — Z87448 Personal history of other diseases of urinary system: Secondary | ICD-10-CM | POA: Diagnosis not present

## 2017-06-13 DIAGNOSIS — N200 Calculus of kidney: Secondary | ICD-10-CM | POA: Insufficient documentation

## 2017-06-13 DIAGNOSIS — N401 Enlarged prostate with lower urinary tract symptoms: Secondary | ICD-10-CM | POA: Diagnosis not present

## 2017-06-13 DIAGNOSIS — N138 Other obstructive and reflux uropathy: Secondary | ICD-10-CM | POA: Diagnosis not present

## 2017-07-07 ENCOUNTER — Other Ambulatory Visit: Payer: Self-pay | Admitting: Family Medicine

## 2017-07-07 DIAGNOSIS — N138 Other obstructive and reflux uropathy: Secondary | ICD-10-CM | POA: Diagnosis not present

## 2017-07-07 DIAGNOSIS — N200 Calculus of kidney: Secondary | ICD-10-CM | POA: Diagnosis not present

## 2017-07-07 DIAGNOSIS — R310 Gross hematuria: Secondary | ICD-10-CM | POA: Diagnosis not present

## 2017-07-07 DIAGNOSIS — N401 Enlarged prostate with lower urinary tract symptoms: Secondary | ICD-10-CM | POA: Diagnosis not present

## 2017-07-10 ENCOUNTER — Other Ambulatory Visit: Payer: Medicare Other

## 2017-07-10 DIAGNOSIS — Z1211 Encounter for screening for malignant neoplasm of colon: Secondary | ICD-10-CM | POA: Diagnosis not present

## 2017-07-11 LAB — FECAL OCCULT BLOOD, IMMUNOCHEMICAL: FECAL OCCULT BLD: NEGATIVE

## 2017-07-30 ENCOUNTER — Other Ambulatory Visit: Payer: Self-pay | Admitting: Family Medicine

## 2017-08-21 ENCOUNTER — Encounter: Payer: Self-pay | Admitting: *Deleted

## 2017-08-22 ENCOUNTER — Other Ambulatory Visit: Payer: Self-pay | Admitting: Family Medicine

## 2017-08-22 NOTE — Telephone Encounter (Signed)
Last Vit D 04/19/17  43.7

## 2017-09-12 ENCOUNTER — Ambulatory Visit: Payer: Medicare Other | Admitting: Family Medicine

## 2017-09-24 ENCOUNTER — Encounter: Payer: Self-pay | Admitting: Family Medicine

## 2017-09-24 ENCOUNTER — Ambulatory Visit (INDEPENDENT_AMBULATORY_CARE_PROVIDER_SITE_OTHER): Payer: Medicare Other | Admitting: Family Medicine

## 2017-09-24 VITALS — BP 142/90 | HR 81 | Temp 97.6°F | Ht 69.0 in | Wt 213.0 lb

## 2017-09-24 DIAGNOSIS — H9193 Unspecified hearing loss, bilateral: Secondary | ICD-10-CM

## 2017-09-24 DIAGNOSIS — E7800 Pure hypercholesterolemia, unspecified: Secondary | ICD-10-CM

## 2017-09-24 DIAGNOSIS — R200 Anesthesia of skin: Secondary | ICD-10-CM

## 2017-09-24 DIAGNOSIS — M25551 Pain in right hip: Secondary | ICD-10-CM

## 2017-09-24 DIAGNOSIS — H9313 Tinnitus, bilateral: Secondary | ICD-10-CM

## 2017-09-24 DIAGNOSIS — I10 Essential (primary) hypertension: Secondary | ICD-10-CM

## 2017-09-24 DIAGNOSIS — K219 Gastro-esophageal reflux disease without esophagitis: Secondary | ICD-10-CM

## 2017-09-24 DIAGNOSIS — E119 Type 2 diabetes mellitus without complications: Secondary | ICD-10-CM | POA: Diagnosis not present

## 2017-09-24 DIAGNOSIS — Z6832 Body mass index (BMI) 32.0-32.9, adult: Secondary | ICD-10-CM

## 2017-09-24 DIAGNOSIS — N400 Enlarged prostate without lower urinary tract symptoms: Secondary | ICD-10-CM

## 2017-09-24 DIAGNOSIS — W57XXXA Bitten or stung by nonvenomous insect and other nonvenomous arthropods, initial encounter: Secondary | ICD-10-CM

## 2017-09-24 DIAGNOSIS — R6889 Other general symptoms and signs: Secondary | ICD-10-CM | POA: Diagnosis not present

## 2017-09-24 DIAGNOSIS — E559 Vitamin D deficiency, unspecified: Secondary | ICD-10-CM

## 2017-09-24 LAB — URINALYSIS, COMPLETE
BILIRUBIN UA: NEGATIVE
Glucose, UA: NEGATIVE
NITRITE UA: NEGATIVE
PROTEIN UA: NEGATIVE
RBC, UA: NEGATIVE
SPEC GRAV UA: 1.025 (ref 1.005–1.030)
UUROB: 0.2 mg/dL (ref 0.2–1.0)
pH, UA: 5 (ref 5.0–7.5)

## 2017-09-24 LAB — MICROSCOPIC EXAMINATION
Bacteria, UA: NONE SEEN
RENAL EPITHEL UA: NONE SEEN /HPF

## 2017-09-24 LAB — BAYER DCA HB A1C WAIVED: HB A1C (BAYER DCA - WAIVED): 7.4 % — ABNORMAL HIGH (ref <7.0)

## 2017-09-24 MED ORDER — METFORMIN HCL ER 500 MG PO TB24: ORAL_TABLET | ORAL | 3 refills | Status: DC

## 2017-09-24 MED ORDER — VITAMIN D (ERGOCALCIFEROL) 1.25 MG (50000 UNIT) PO CAPS
ORAL_CAPSULE | ORAL | 3 refills | Status: DC
Start: 2017-09-24 — End: 2018-10-27

## 2017-09-24 MED ORDER — FINASTERIDE 5 MG PO TABS: ORAL_TABLET | ORAL | 3 refills | Status: DC

## 2017-09-24 MED ORDER — DOXYCYCLINE HYCLATE 100 MG PO TABS: 100.0000 mg | ORAL_TABLET | Freq: Two times a day (BID) | ORAL | 0 refills | Status: DC

## 2017-09-24 MED ORDER — LOSARTAN POTASSIUM-HCTZ 100-12.5 MG PO TABS: ORAL_TABLET | ORAL | 3 refills | Status: DC

## 2017-09-24 NOTE — Progress Notes (Addendum)
Subjective:    Patient ID: Roger Palmer, male    DOB: 05/07/1951, 67 y.o.   MRN: [id number]  HPI Pt here for follow up and management of chronic medical problems which includes hypertension and hyperlipidemia. He is taking medication regularly.  The patient today is complaining with some right hip pain tinnitus in his ears and right foot numbness and tingling.  He is requesting refills on several of his medicines and this will be done.  His vital signs are stable and the good news is his weight is down about 8 pounds since the last visit.  He is due to get lab work today and a urinalysis and EKG today along with a rectal exam and PSA.  The patient does have bring in blood pressures for review along with blood sugars.  Pulse rates are good in the 60 and 70 range.  The majority of the blood pressures are running in the upper 130 range to the 150 range.  The diastolics are running anywhere from upper 60s up to as high as 100 but the majority are less than 90 for the diastolic.  The blood sugars in general and I am not sure if these are fasting or not but seem to range in the 140 range on the average.  The patient today denies any chest pain pressure tightness or shortness of breath.  He denies any trouble with swallowing heartburn indigestion nausea vomiting diarrhea change in bowel habits or blood in the stool.  He has colonoscopy a little over a year ago it was told he did not have to come back for 10 years.  He is passing his water well but it is especially slow when he gets up in the morning.  He recently saw Dr. Lawerance Bach, the urologist who did a cystoscopy and is following him for his enlarged prostate.  Apparently everything was negative according to the patient and the urologist plans to see him back again in 6 months.  The biggest issues today are his right hip pain and the tingling and numbness in his right foot.  5 years ago he had a fall out of a tree while using a chainsaw and had a severe injury  requiring multiple surgeries on his arm and hip areas.  He has not seen the surgeon since that time and his name was Roger Rouse MD.  We will arrange for him to see him for follow-up.  The patient also complains of ongoing tinnitus and hearing issues and has never had a good hearing evaluation so we will send him to the ear nose and throat specialist for this to occur.  The blood sugars and blood pressures were brought in by the patient today and will be scanned into the record.  The patient did say he forgot to take his blood pressure medicine this morning.    Patient Active Problem List   Diagnosis Date Noted  . Hyperlipidemia 11/09/2013  . 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 High risk medication use 08/14/2012  . Brain injury without skull fracture ([place]) 07/24/2012  . Hypernatremia 07/20/2012  . Hypokalemia 07/14/2012  . Nephrolithiasis   . Hypertension   . Back pain   . Obesity   . BPH (benign prostatic hypertrophy)   . Gastritis   . Diabetes mellitus without complication ([place])   . Acid reflux 05/04/2011   Outpatient Encounter Medications as of 09/24/2017  Medication Sig  . atorvastatin (LIPITOR) 40 MG  tablet TAKE ONE (1) TABLET EACH DAY  . fenofibrate 160 MG tablet TAKE ONE (1) TABLET EACH DAY  . finasteride (PROSCAR) 5 MG tablet TAKE ONE (1) TABLET EACH DAY  . loratadine (CLARITIN) 10 MG tablet Take 10 mg by mouth as needed for allergies.  Marland Kitchen losartan-hydrochlorothiazide (HYZAAR) 100-12.5 MG tablet TAKE ONE (1) TABLET EACH DAY  . metFORMIN (GLUCOPHAGE-XR) 500 MG 24 hr tablet TAKE 1 TABLET EVERY MORNING WITH BREAKFAST  . omeprazole (PRILOSEC) 20 MG capsule TAKE ONE (1) CAPSULE EACH DAY  . tamsulosin (FLOMAX) 0.4 MG CAPS capsule TAKE ONE (1) CAPSULE EACH DAY  . Vitamin D, Ergocalciferol, (DRISDOL) 50000 units CAPS capsule TAKE 1 CAPSULE EVERY WEEK   No facility-administered encounter medications on file as of 09/24/2017.      Review of Systems    Constitutional: Negative.   HENT: Positive for tinnitus.   Eyes: Negative.   Respiratory: Negative.   Cardiovascular: Negative.   Gastrointestinal: Negative.   Endocrine: Negative.   Genitourinary: Negative.   Musculoskeletal: Positive for arthralgias (right hip pain in the afternoons).  Skin: Negative.   Allergic/Immunologic: Negative.   Neurological: Positive for numbness (in right foot ).  Hematological: Negative.   Psychiatric/Behavioral: Negative.        Objective:   Physical Exam  Constitutional: He is oriented to person, place, and time. He appears well-developed and well-nourished. No distress.  Patient is pleasant and calm  HENT:  Head: Normocephalic and atraumatic.  Right Ear: External ear normal.  Left Ear: External ear normal.  Mouth/Throat: Oropharynx is clear and moist. No oropharyngeal exudate.  Minimal cerumen right ear canal with turbinate congestion bilaterally  Eyes: Pupils are equal, round, and reactive to light. Conjunctivae and EOM are normal. Right eye exhibits no discharge. Left eye exhibits no discharge.  Neck: Normal range of motion. Neck supple. No thyromegaly present.  No bruits thyromegaly or anterior cervical adenopathy  Cardiovascular: Normal rate, regular rhythm, normal heart sounds and intact distal pulses.  No murmur heard. Heart is regular at 60/min without murmurs  Pulmonary/Chest: Effort normal and breath sounds normal. No respiratory distress. He has no wheezes. He has no rales. He exhibits no tenderness.  Clear anteriorly and posteriorly no axillary adenopathy  Abdominal: Soft. Bowel sounds are normal. He exhibits no mass. There is no tenderness. There is no guarding.  No epigastric tenderness organ enlargement bruits or inguinal adenopathy  Genitourinary:  Genitourinary Comments: This is followed by Dr. Lawerance Bach, urologist and the last exam was since Christmas.  He will see him again in 6 months from that time.  He apparently has had  some blood in the urine and has an enlarged prostate.  Musculoskeletal: Normal range of motion. He exhibits no edema or tenderness.  Range of motion is somewhat limited in the right arm.  He is having right hip pain and the reflexes in the right lower extremity are slightly decreased compared to those in the left.  Lymphadenopathy:    He has no cervical adenopathy.  Neurological: He is alert and oriented to person, place, and time. He has normal reflexes. A cranial nerve deficit is present.  Diminished reflexes right lower extremity compared to the left  Skin: Skin is warm and dry. No rash noted.  Psychiatric: He has a normal mood and affect. His behavior is normal. Judgment and thought content normal.  Mood behavior affect and judgment normal  Nursing note and vitals reviewed.   BP 130/76 (BP Location: Left Arm)   Pulse  81   Temp 97.6 F (36.4 C) (Oral)   Ht _0  (1.753 m)   Wt 213 lb (96.6 kg)   BMI 31.45 kg/m        Assessment & Plan:  1. Essential hypertension -Blood pressure is somewhat elevated today when it was checked by me.  He did not take his blood pressure medicine this morning and the patient just reminded me of that so we will not make any changes based on the readings obtained today. - BMP8+EGFR - CBC with Differential/Platelet - Hepatic function panel - EKG 12-Lead  2. Pure hypercholesterolemia -Continue with aggressive therapeutic lifestyle changes and atorvastatin and fenofibrate - CBC with Differential/Platelet - Lipid panel - EKG 12-Lead  3. Vitamin D deficiency -Continue with vitamin D replacement pending results of lab work - CBC with Differential/Platelet - VITAMIN D 25 Hydroxy (Vit-D Deficiency, Fractures)  4. Diabetes mellitus without complication ([place]) -Continue with current treatment and therapeutic lifestyle changes pending results of lab work - CBC with Differential/Platelet - Bayer DCA Hb A1c Waived  5. Gastroesophageal reflux disease,  esophagitis presence not specified -Patient is having no symptoms with this and he will continue to watch his diet closely. - CBC with Differential/Platelet - Hepatic function panel  6. Benign prostatic hyperplasia, unspecified whether lower urinary tract symptoms present -Continue to follow-up with Dr. Lawerance Bach, urologist - CBC with Differential/Platelet - PSA, total and free - Urinalysis, Complete  7. Decreased hearing of both ears -Schedule visit with Dr. Vicie Mutters - Ambulatory referral to ENT  8. Tinnitus of both ears -Dr. Vicie Mutters - Ambulatory referral to ENT  9. Right hip pain -Follow-up with surgeon who did hip surgery from trauma event 5 years ago - Ambulatory referral to Orthopedic Surgery  10. Numbness of right foot - Ambulatory referral to Orthopedic Surgery  11.  Tick bite -Test for Lyme disease and [facility] spotted fever with treatment of doxycycline 100 mg twice daily with food for 3 weeks  12.  BMI of 32.64 -This was addressed and the patient must continue to work on weight loss through diet and exercise  Meds ordered this encounter  Medications  . losartan-hydrochlorothiazide (HYZAAR) 100-12.5 MG tablet    Sig: TAKE ONE (1) TABLET EACH DAY    Dispense:  90 tablet    Refill:  3  . Vitamin D, Ergocalciferol, (DRISDOL) 50000 units CAPS capsule    Sig: TAKE 1 CAPSULE EVERY WEEK    Dispense:  12 capsule    Refill:  3  . finasteride (PROSCAR) 5 MG tablet    Sig: TAKE ONE (1) TABLET EACH DAY    Dispense:  90 tablet    Refill:  3  . metFORMIN (GLUCOPHAGE-XR) 500 MG 24 hr tablet    Sig: TAKE 1 TABLET EVERY MORNING WITH BREAKFAST    Dispense:  90 tablet    Refill:  3   Patient Instructions                       Medicare Annual Wellness Visit  [facility] and the medical providers at [facility] strive to bring you the best medical care.  In doing so we not only want to address your current medical conditions and concerns but  also to detect new conditions early and prevent illness, disease and health-related problems.    Medicare offers a yearly Wellness Visit which allows our clinical staff to assess your need for preventative services including immunizations,  lifestyle education, counseling to decrease risk of preventable diseases and screening for fall risk and other medical concerns.    This visit is provided free of charge (no copay) for all Medicare recipients. The clinical pharmacists at [facility] have begun to conduct these Wellness Visits which will also include a thorough review of all your medications.    As you primary medical provider recommend that you make an appointment for your Annual Wellness Visit if you have not done so already this year.  You may set up this appointment before you leave today or you may call back ([phone]) and schedule an appointment.  Please make sure when you call that you mention that you are scheduling your Annual Wellness Visit with the clinical pharmacist so that the appointment may be made for the proper length of time.     Continue current medications. Continue good therapeutic lifestyle changes which include good diet and exercise. Fall precautions discussed with patient. If an FOBT was given today- please return it to our front desk. If you are over 4 years old - you may need Prevnar 51 or the adult Pneumonia vaccine.  **Flu shots are available--- please call and schedule a FLU-CLINIC appointment**  After your visit with [place] today you will receive a survey in the mail or online from [employer] regarding your care with [place]. Please take a moment to fill this out. Your feedback is very important to [place] as you can help [place] better understand your patient needs as well as improve your experience and satisfaction. WE CARE ABOUT YOU!!!   We will arrange for you to have an appointment with the doctor that did your previous surgery 5 years ago, Dr. Marcelino Scot to  follow-up on the right hip pain and neuropathy that you are having in the right foot. We will also arrange for you to have a visit with Dr. Vicie Mutters to further evaluate your hearing and tinnitus in your ears. Continue to drink plenty of fluids and stay well-hydrated It is important that you schedule yourself for an eye visit with your eye doctor make sure that we get a copy of that visit sent to our office.   Arrie Senate MD

## 2017-09-24 NOTE — Patient Instructions (Addendum)
Medicare Annual Wellness Visit  [facility] and the medical providers at [facility] strive to bring you the best medical care.  In doing so we not only want to address your current medical conditions and concerns but also to detect new conditions early and prevent illness, disease and health-related problems.    Medicare offers a yearly Wellness Visit which allows our clinical staff to assess your need for preventative services including immunizations, lifestyle education, counseling to decrease risk of preventable diseases and screening for fall risk and other medical concerns.    This visit is provided free of charge (no copay) for all Medicare recipients. The clinical pharmacists at [facility] have begun to conduct these Wellness Visits which will also include a thorough review of all your medications.    As you primary medical provider recommend that you make an appointment for your Annual Wellness Visit if you have not done so already this year.  You may set up this appointment before you leave today or you may call back ([phone]) and schedule an appointment.  Please make sure when you call that you mention that you are scheduling your Annual Wellness Visit with the clinical pharmacist so that the appointment may be made for the proper length of time.     Continue current medications. Continue good therapeutic lifestyle changes which include good diet and exercise. Fall precautions discussed with patient. If an FOBT was given today- please return it to our front desk. If you are over 69 years old - you may need Prevnar 39 or the adult Pneumonia vaccine.  **Flu shots are available--- please call and schedule a FLU-CLINIC appointment**  After your visit with [place] today you will receive a survey in the mail or online from [employer] regarding your care with [place]. Please take a moment to fill this out. Your feedback is very  important to [place] as you can help [place] better understand your patient needs as well as improve your experience and satisfaction. WE CARE ABOUT YOU!!!   We will arrange for you to have an appointment with the doctor that did your previous surgery 5 years ago, Dr. Marcelino Scot to follow-up on the right hip pain and neuropathy that you are having in the right foot. We will also arrange for you to have a visit with Dr. Vicie Mutters to further evaluate your hearing and tinnitus in your ears. Continue to drink plenty of fluids and stay well-hydrated It is important that you schedule yourself for an eye visit with your eye doctor make sure that we get a copy of that visit sent to our office.

## 2017-09-25 LAB — LIPID PANEL
CHOLESTEROL TOTAL: 129 mg/dL (ref 100–199)
Chol/HDL Ratio: 2.7 ratio (ref 0.0–5.0)
HDL: 48 mg/dL (ref >39)
LDL CALC: 57 mg/dL (ref 0–99)
Triglycerides: 122 mg/dL (ref 0–149)
VLDL CHOLESTEROL CAL: 24 mg/dL (ref 5–40)

## 2017-09-25 LAB — BMP8+EGFR
BUN/Creatinine Ratio: 16 (ref 10–24)
BUN: 19 mg/dL (ref 8–27)
CALCIUM: 10 mg/dL (ref 8.6–10.2)
CHLORIDE: 114 mmol/L — AB (ref 96–106)
CO2: 22 mmol/L (ref 20–29)
Creatinine, Ser: 1.2 mg/dL (ref 0.76–1.27)
GFR calc non Af Amer: 62 mL/min/{1.73_m2} (ref >59)
GFR, EST AFRICAN AMERICAN: 71 mL/min/{1.73_m2} (ref >59)
Glucose: 104 mg/dL — ABNORMAL HIGH (ref 65–99)
Potassium: 3.8 mmol/L (ref 3.5–5.2)
Sodium: 150 mmol/L — ABNORMAL HIGH (ref 134–144)

## 2017-09-25 LAB — HEPATIC FUNCTION PANEL
ALBUMIN: 4.3 g/dL (ref 3.6–4.8)
ALK PHOS: 63 IU/L (ref 39–117)
ALT: 23 IU/L (ref 0–44)
AST: 21 IU/L (ref 0–40)
BILIRUBIN TOTAL: 0.3 mg/dL (ref 0.0–1.2)
Bilirubin, Direct: 0.12 mg/dL (ref 0.00–0.40)
Total Protein: 7.1 g/dL (ref 6.0–8.5)

## 2017-09-25 LAB — CBC WITH DIFFERENTIAL/PLATELET
BASOS ABS: 0 10*3/uL (ref 0.0–0.2)
Basos: 1 %
EOS (ABSOLUTE): 0.1 10*3/uL (ref 0.0–0.4)
Eos: 2 %
HEMOGLOBIN: 14 g/dL (ref 13.0–17.7)
Hematocrit: 42.6 % (ref 37.5–51.0)
IMMATURE GRANS (ABS): 0 10*3/uL (ref 0.0–0.1)
Immature Granulocytes: 0 %
LYMPHS: 24 %
Lymphocytes Absolute: 2.1 10*3/uL (ref 0.7–3.1)
MCH: 29.9 pg (ref 26.6–33.0)
MCHC: 32.9 g/dL (ref 31.5–35.7)
MCV: 91 fL (ref 79–97)
MONOCYTES: 6 %
Monocytes Absolute: 0.5 10*3/uL (ref 0.1–0.9)
NEUTROS ABS: 5.8 10*3/uL (ref 1.4–7.0)
Neutrophils: 67 %
Platelets: 235 10*3/uL (ref 150–379)
RBC: 4.68 x10E6/uL (ref 4.14–5.80)
RDW: 13.3 % (ref 12.3–15.4)
WBC: 8.6 10*3/uL (ref 3.4–10.8)

## 2017-09-25 LAB — PSA, TOTAL AND FREE
PSA, Free Pct: 46 %
PSA, Free: 0.23 ng/mL
Prostate Specific Ag, Serum: 0.5 ng/mL (ref 0.0–4.0)

## 2017-09-25 LAB — VITAMIN D 25 HYDROXY (VIT D DEFICIENCY, FRACTURES): Vit D, 25-Hydroxy: 50.2 ng/mL (ref 30.0–100.0)

## 2017-09-26 LAB — ROCKY MTN SPOTTED FVR ABS PNL(IGG+IGM)
RMSF IGG: NEGATIVE
RMSF IGM: 0.6 {index} (ref 0.00–0.89)

## 2017-09-26 LAB — LYME AB/WESTERN BLOT REFLEX
LYME DISEASE AB, QUANT, IGM: <0.8 index (ref 0.00–0.79)
Lyme IgG/IgM Ab: <0.91 {ISR} (ref 0.00–0.90)

## 2017-10-08 DIAGNOSIS — M1651 Unilateral post-traumatic osteoarthritis, right hip: Secondary | ICD-10-CM | POA: Diagnosis not present

## 2017-10-21 DIAGNOSIS — H903 Sensorineural hearing loss, bilateral: Secondary | ICD-10-CM | POA: Diagnosis not present

## 2017-10-21 DIAGNOSIS — E119 Type 2 diabetes mellitus without complications: Secondary | ICD-10-CM | POA: Diagnosis not present

## 2017-10-21 DIAGNOSIS — H9313 Tinnitus, bilateral: Secondary | ICD-10-CM | POA: Diagnosis not present

## 2017-10-24 ENCOUNTER — Other Ambulatory Visit: Payer: Self-pay | Admitting: Family Medicine

## 2017-11-29 ENCOUNTER — Other Ambulatory Visit: Payer: Self-pay | Admitting: Family Medicine

## 2018-01-27 ENCOUNTER — Other Ambulatory Visit: Payer: Self-pay | Admitting: Family Medicine

## 2018-02-04 ENCOUNTER — Ambulatory Visit (INDEPENDENT_AMBULATORY_CARE_PROVIDER_SITE_OTHER): Payer: Medicare Other | Admitting: Family Medicine

## 2018-02-04 ENCOUNTER — Encounter: Payer: Self-pay | Admitting: Family Medicine

## 2018-02-04 VITALS — BP 133/75 | HR 74 | Temp 97.0°F | Ht 69.0 in | Wt 208.6 lb

## 2018-02-04 DIAGNOSIS — E119 Type 2 diabetes mellitus without complications: Secondary | ICD-10-CM

## 2018-02-04 DIAGNOSIS — E1140 Type 2 diabetes mellitus with diabetic neuropathy, unspecified: Secondary | ICD-10-CM | POA: Diagnosis not present

## 2018-02-04 DIAGNOSIS — Z23 Encounter for immunization: Secondary | ICD-10-CM

## 2018-02-04 DIAGNOSIS — R200 Anesthesia of skin: Secondary | ICD-10-CM | POA: Diagnosis not present

## 2018-02-04 DIAGNOSIS — I10 Essential (primary) hypertension: Secondary | ICD-10-CM | POA: Diagnosis not present

## 2018-02-04 DIAGNOSIS — E7800 Pure hypercholesterolemia, unspecified: Secondary | ICD-10-CM

## 2018-02-04 DIAGNOSIS — H9193 Unspecified hearing loss, bilateral: Secondary | ICD-10-CM

## 2018-02-04 DIAGNOSIS — E559 Vitamin D deficiency, unspecified: Secondary | ICD-10-CM | POA: Diagnosis not present

## 2018-02-04 DIAGNOSIS — M25551 Pain in right hip: Secondary | ICD-10-CM

## 2018-02-04 LAB — BASIC METABOLIC PANEL WITH GFR
BUN/Creatinine Ratio: 12 (ref 10–24)
BUN: 14 mg/dL (ref 8–27)
CO2: 25 mmol/L (ref 20–29)
Calcium: 9.5 mg/dL (ref 8.6–10.2)
Chloride: 106 mmol/L (ref 96–106)
Creatinine, Ser: 1.17 mg/dL (ref 0.76–1.27)
GFR calc Af Amer: 74 mL/min/1.73
GFR calc non Af Amer: 64 mL/min/1.73
Glucose: 129 mg/dL — ABNORMAL HIGH (ref 65–99)
Potassium: 4.3 mmol/L (ref 3.5–5.2)
Sodium: 144 mmol/L (ref 134–144)

## 2018-02-04 LAB — CBC WITH DIFFERENTIAL/PLATELET
Basophils Absolute: 0 x10E3/uL (ref 0.0–0.2)
Basos: 1 %
EOS (ABSOLUTE): 0.1 x10E3/uL (ref 0.0–0.4)
Eos: 2 %
Hematocrit: 40.8 % (ref 37.5–51.0)
Hemoglobin: 14.1 g/dL (ref 13.0–17.7)
Immature Grans (Abs): 0 x10E3/uL (ref 0.0–0.1)
Immature Granulocytes: 0 %
Lymphocytes Absolute: 1.7 x10E3/uL (ref 0.7–3.1)
Lymphs: 28 %
MCH: 31.8 pg (ref 26.6–33.0)
MCHC: 34.6 g/dL (ref 31.5–35.7)
MCV: 92 fL (ref 79–97)
Monocytes Absolute: 0.4 x10E3/uL (ref 0.1–0.9)
Monocytes: 6 %
Neutrophils Absolute: 3.9 x10E3/uL (ref 1.4–7.0)
Neutrophils: 63 %
Platelets: 235 x10E3/uL (ref 150–450)
RBC: 4.44 x10E6/uL (ref 4.14–5.80)
RDW: 13.1 % (ref 12.3–15.4)
WBC: 6.2 x10E3/uL (ref 3.4–10.8)

## 2018-02-04 LAB — LIPID PANEL
CHOL/HDL RATIO: 2.5 ratio (ref 0.0–5.0)
CHOLESTEROL TOTAL: 132 mg/dL (ref 100–199)
HDL: 53 mg/dL (ref >39)
LDL CALC: 70 mg/dL (ref 0–99)
Triglycerides: 47 mg/dL (ref 0–149)
VLDL CHOLESTEROL CAL: 9 mg/dL (ref 5–40)

## 2018-02-04 LAB — HEPATIC FUNCTION PANEL
ALK PHOS: 49 IU/L (ref 39–117)
ALT: 20 IU/L (ref 0–44)
AST: 22 IU/L (ref 0–40)
Albumin: 4 g/dL (ref 3.6–4.8)
BILIRUBIN, DIRECT: 0.2 mg/dL (ref 0.00–0.40)
Bilirubin Total: 0.6 mg/dL (ref 0.0–1.2)
TOTAL PROTEIN: 6.5 g/dL (ref 6.0–8.5)

## 2018-02-04 LAB — BAYER DCA HB A1C WAIVED: HB A1C (BAYER DCA - WAIVED): 6.8 %

## 2018-02-04 MED ORDER — ICOSAPENT ETHYL 1 G PO CAPS: 2.0000 g | ORAL_CAPSULE | Freq: Two times a day (BID) | ORAL | 3 refills | Status: DC

## 2018-02-04 NOTE — Patient Instructions (Addendum)
Continue current medications. Continue good therapeutic lifestyle changes which include good diet and exercise. Fall precautions discussed with patient. If an FOBT was given today- please return it to our front desk. If you are over 68 years old - you may need Prevnar 29 or the adult Pneumonia vaccine.  **Flu shots are available--- please call and schedule a FLU-CLINIC appointment**  After your visit with [place] today you will receive a survey in the mail or online from [employer] regarding your care with [place]. Please take a moment to fill this out. Your feedback is very important to [place] as you can help [place] better understand your patient needs as well as improve your experience and satisfaction. WE CARE ABOUT YOU!!!   We will check and see if your insurance will cover Vascepa.  This medicine has been shown to reduce events of stroke and heart attack very significantly in people that have diabetes.  You would take 2 g twice daily.  If your insurance covers this medicine we will probably down the road discontinued the fenofibrate. Do Not forget to get your flu shot in October Continue to be careful and do not put yourself at risk for falling Drink plenty of water and fluids and stay well-hydrated Check blood sugars a little bit more frequently If you keep having joint pain in the right hip, consider coming back and seeing 1 of the orthopedic surgeons to come to this office for possible injection in that hip.  Just call back my nurse and she will schedule you for a visit.  They come once monthly. Please make sure that when you get your eye exam that we are sent a hard copy of that report

## 2018-02-04 NOTE — Progress Notes (Signed)
Subjective:    Patient ID: Roger Palmer, male    DOB: 11/01/1950, 67 y.o.   MRN: [id number]  HPI Patient here today for a follow up of chronic medical conditions which include BPH, hypertension, hyperlipidemia and diabetes.  The patient is doing well with no specific complaints.  He will get lab work today.  His vital signs are stable.  The patient denies any chest pain pressure tightness or shortness of breath.  He denies any trouble with swallowing heartburn indigestion nausea vomiting diarrhea or blood in the stool.  He is passing his water without problems.  He still has occasional pain and cramping in the right leg secondary to the trauma from the right hip and the surgery he had several years ago.  He went back to see the trauma surgeon recently and the trauma surgeon thought that most of the pain was secondary to arthritis.    Patient Active Problem List   Diagnosis Date Noted  . Hyperlipidemia 11/09/2013  . 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 High risk medication use 08/14/2012  . 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 Hypernatremia 07/20/2012  . Hypokalemia 07/14/2012  . Nephrolithiasis   . Hypertension   . Back pain   . Obesity   . BPH (benign prostatic hypertrophy)   . Gastritis   . Diabetes mellitus without complication ([place])   . Acid reflux 05/04/2011   Outpatient Encounter Medications as of 02/04/2018  Medication Sig  . atorvastatin (LIPITOR) 40 MG tablet TAKE ONE (1) TABLET EACH DAY  . fenofibrate 160 MG tablet TAKE ONE (1) TABLET EACH DAY  . finasteride (PROSCAR) 5 MG tablet TAKE ONE (1) TABLET EACH DAY  . ibuprofen (ADVIL,MOTRIN) 200 MG tablet Take 200 mg by mouth 3 (three) times daily. Weekly  every other week  . loratadine (CLARITIN) 10 MG tablet Take 10 mg by mouth as needed for allergies.  Mar[facility] Kitchen losartan-hydrochlorothiazide (HYZAAR) 100-12.5 MG tablet TAKE ONE (1) TABLET EACH DAY  . metFORMIN (GLUCOPHAGE-XR) 500 MG 24  hr tablet TAKE 1 TABLET EVERY MORNING WITH BREAKFAST  . omeprazole (PRILOSEC) 20 MG capsule TAKE ONE (1) CAPSULE EACH DAY  . tamsulosin (FLOMAX) 0.4 MG CAPS capsule TAKE ONE (1) CAPSULE EACH DAY  . Vitamin D, Ergocalciferol, (DRISDOL) 50000 units CAPS capsule TAKE 1 CAPSULE EVERY WEEK  . doxycycline (VIBRA-TABS) 100 MG tablet Take 1 tablet (100 mg total) by mouth 2 (two) times daily. 1 po bid   No facility-administered encounter medications on file as of 02/04/2018.       Review of Systems  HENT: Negative.   Respiratory: Negative.   Genitourinary: Negative.   Psychiatric/Behavioral: Negative.        Objective:   Physical Exam  Constitutional: He is oriented to person, place, and time. He appears well-developed and well-nourished. No distress.  The patient is calm pleasant and relaxed.  He is lost some weight because his daughter has him on a diet.  HENT:  Head: Normocephalic and atraumatic.  Right Ear: External ear normal.  Left Ear: External ear normal.  Mouth/Throat: Oropharynx is clear and moist. No oropharyngeal exudate.  Slight nasal turbinate congestion bilaterally  Eyes: Pupils are equal, round, and reactive to light. Conjunctivae and EOM are normal. Right eye exhibits no discharge. Left eye exhibits no discharge. No scleral icterus.  Eye exam coming up soon and patient will make sure we get a hard copy of this report  Neck: Normal range  of motion. Neck supple. No thyromegaly present.  No bruits thyromegaly or anterior cervical adenopathy  Cardiovascular: Normal rate, regular rhythm, normal heart sounds and intact distal pulses.  No murmur heard. Heart has a regular rate and rhythm at 72/min good pedal pulses and no murmurs  Pulmonary/Chest: Effort normal and breath sounds normal. He has no wheezes. He has no rales. He exhibits no tenderness.  Clear anteriorly and posteriorly and no axillary adenopathy or chest wall masses or tenderness  Abdominal: Soft. Bowel sounds are  normal. He exhibits no mass. There is no tenderness. There is no rebound and no guarding.  No liver or spleen enlargement no epigastric tenderness no masses and no bruits  Musculoskeletal: Normal range of motion. He exhibits no edema.  Lymphadenopathy:    He has no cervical adenopathy.  Neurological: He is alert and oriented to person, place, and time. He has normal reflexes. No cranial nerve deficit.  Flexes are 2+ and equal bilaterally slight decreased sensation to the dorsal aspect of the right foot near the first and second toe.  Skin: Skin is warm and dry. No rash noted.  Psychiatric: He has a normal mood and affect. His behavior is normal. Judgment and thought content normal.  Mood affect and behavior for this person are normal for him  Nursing note and vitals reviewed.  BP 133/75   Pulse 74   Temp (!) 97 F (36.1 C) (Oral)   Ht 5\' 9"  (1.753 m)   Wt 208 lb 9.6 oz (94.6 kg)   BMI 30.80 kg/m        Assessment & Plan:  1. Essential hypertension -The blood pressure was good and the patient will continue his current treatment and continue to make all efforts at weight loss through diet and exercise - Basic Metabolic Panel - CBC with Differential/Platelet  2. Pure hypercholesterolemia -Continue current treatment pending results of lab work.  Will check and see if his insurance will cover Vascepa and will have him start this if it does. - Hepatic function panel - Lipid panel  3. Vitamin D deficiency -Continue vitamin D replacement pending results of lab work  4. Diabetes mellitus without complication ([place]) -Patient does have a neuropathy to the right foot. - Bayer DCA Hb A1c Waived - Lipid panel  5. Right hip pain -We have encouraged the patient to return to the office to see the orthopedic surgeon regarding a possible injection to the right hip from the trauma that he had several years ago.  6. Numbness of right foot -Numbness to the top of the right foot  7.  Decreased hearing of both ears -Consider getting hearing aids.  Has seen the ear nose and throat specialist.  8.  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 Continue good therapeutic lifestyle changes which include good diet and exercise. Fall precautions discussed with patient. If an FOBT was given today- please return it to our front desk. If you are over 89 years old - you may need Prevnar 67 or the adult Pneumonia vaccine.  **Flu shots are available--- please call and schedule a FLU-CLINIC appointment**  After your visit with [place] today you will receive a survey in the mail or online from [employer] regarding your care with [place]. Please take a moment to fill this out. Your feedback is very important to [place] as you can help [place] better understand your patient needs as well as improve your experience and satisfaction. WE CARE  ABOUT YOU!!!   We will check and see if your insurance will cover Vascepa.  This medicine has been shown to reduce events of stroke and heart attack very significantly in people that have diabetes.  You would take 2 g twice daily.  If your insurance covers this medicine we will probably down the road discontinued the fenofibrate. Do Not forget to get your flu shot in October Continue to be careful and do not put yourself at risk for falling Drink plenty of water and fluids and stay well-hydrated Check blood sugars a little bit more frequently If you keep having joint pain in the right hip, consider coming back and seeing 1 of the orthopedic surgeons to come to this office for possible injection in that hip.  Just call back my nurse and she will schedule you for a visit.  They come once monthly. Please make sure that when you get your eye exam that we are sent a hard copy of that report   Arrie Senate MD

## 2018-04-20 ENCOUNTER — Other Ambulatory Visit: Payer: Self-pay | Admitting: Family Medicine

## 2018-04-21 NOTE — Telephone Encounter (Signed)
OV 06/05/18

## 2018-05-04 ENCOUNTER — Other Ambulatory Visit: Payer: Self-pay | Admitting: Family Medicine

## 2018-05-20 ENCOUNTER — Other Ambulatory Visit: Payer: Self-pay | Admitting: Family Medicine

## 2018-05-22 DIAGNOSIS — H40033 Anatomical narrow angle, bilateral: Secondary | ICD-10-CM | POA: Diagnosis not present

## 2018-05-22 DIAGNOSIS — H2513 Age-related nuclear cataract, bilateral: Secondary | ICD-10-CM | POA: Diagnosis not present

## 2018-05-22 LAB — HM DIABETES EYE EXAM

## 2018-06-03 ENCOUNTER — Other Ambulatory Visit: Payer: Self-pay | Admitting: Family Medicine

## 2018-06-05 ENCOUNTER — Ambulatory Visit (INDEPENDENT_AMBULATORY_CARE_PROVIDER_SITE_OTHER): Payer: Medicare Other | Admitting: Family Medicine

## 2018-06-05 ENCOUNTER — Encounter: Payer: Self-pay | Admitting: Family Medicine

## 2018-06-05 VITALS — BP 119/67 | HR 74 | Temp 96.8°F | Ht 69.0 in | Wt 206.0 lb

## 2018-06-05 DIAGNOSIS — E559 Vitamin D deficiency, unspecified: Secondary | ICD-10-CM | POA: Diagnosis not present

## 2018-06-05 DIAGNOSIS — I10 Essential (primary) hypertension: Secondary | ICD-10-CM | POA: Diagnosis not present

## 2018-06-05 DIAGNOSIS — E7800 Pure hypercholesterolemia, unspecified: Secondary | ICD-10-CM

## 2018-06-05 DIAGNOSIS — E1140 Type 2 diabetes mellitus with diabetic neuropathy, unspecified: Secondary | ICD-10-CM | POA: Diagnosis not present

## 2018-06-05 DIAGNOSIS — K219 Gastro-esophageal reflux disease without esophagitis: Secondary | ICD-10-CM

## 2018-06-05 LAB — BAYER DCA HB A1C WAIVED: HB A1C (BAYER DCA - WAIVED): 7 % — ABNORMAL HIGH (ref <7.0)

## 2018-06-05 NOTE — Patient Instructions (Addendum)
Medicare Annual Wellness Visit  [facility] and the medical providers at [facility] strive to bring you the best medical care.  In doing so we not only want to address your current medical conditions and concerns but also to detect new conditions early and prevent illness, disease and health-related problems.    Medicare offers a yearly Wellness Visit which allows our clinical staff to assess your need for preventative services including immunizations, lifestyle education, counseling to decrease risk of preventable diseases and screening for fall risk and other medical concerns.    This visit is provided free of charge (no copay) for all Medicare recipients. The clinical pharmacists at [facility] have begun to conduct these Wellness Visits which will also include a thorough review of all your medications.    As you primary medical provider recommend that you make an appointment for your Annual Wellness Visit if you have not done so already this year.  You may set up this appointment before you leave today or you may call back ([phone]) and schedule an appointment.  Please make sure when you call that you mention that you are scheduling your Annual Wellness Visit with the clinical pharmacist so that the appointment may be made for the proper length of time.     Continue current medications. Continue good therapeutic lifestyle changes which include good diet and exercise. Fall precautions discussed with patient. If an FOBT was given today- please return it to our front desk. If you are over 57 years old - you may need Prevnar 72 or the adult Pneumonia vaccine.  **Flu shots are available--- please call and schedule a FLU-CLINIC appointment**  After your visit with [place] today you will receive a survey in the mail or online from [employer] regarding your care with [place]. Please take a moment to fill this out. Your feedback is very  important to [place] as you can help [place] better understand your patient needs as well as improve your experience and satisfaction. WE CARE ABOUT YOU!!!   Follow-up with urology as needed Continue to check blood sugars regularly Watch sodium intake Avoid NSAIDs as much as possible and take Tylenol if needed for aches and pains Continue to get eye exam yearly
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
## 2018-06-06 LAB — CBC WITH DIFFERENTIAL/PLATELET
BASOS ABS: 0.1 10*3/uL (ref 0.0–0.2)
Basos: 1 %
EOS (ABSOLUTE): 0.1 10*3/uL (ref 0.0–0.4)
EOS: 2 %
HEMOGLOBIN: 13.6 g/dL (ref 13.0–17.7)
Hematocrit: 40.8 % (ref 37.5–51.0)
IMMATURE GRANS (ABS): 0 10*3/uL (ref 0.0–0.1)
Immature Granulocytes: 0 %
LYMPHS ABS: 1.6 10*3/uL (ref 0.7–3.1)
LYMPHS: 34 %
MCH: 30.8 pg (ref 26.6–33.0)
MCHC: 33.3 g/dL (ref 31.5–35.7)
MCV: 92 fL (ref 79–97)
MONOCYTES: 9 %
Monocytes Absolute: 0.4 10*3/uL (ref 0.1–0.9)
NEUTROS ABS: 2.5 10*3/uL (ref 1.4–7.0)
Neutrophils: 54 %
Platelets: 205 10*3/uL (ref 150–450)
RBC: 4.42 x10E6/uL (ref 4.14–5.80)
RDW: 13 % (ref 11.6–15.4)
WBC: 4.7 10*3/uL (ref 3.4–10.8)

## 2018-06-06 LAB — LIPID PANEL
CHOL/HDL RATIO: 2.3 ratio (ref 0.0–5.0)
CHOLESTEROL TOTAL: 116 mg/dL (ref 100–199)
HDL: 51 mg/dL (ref >39)
LDL CALC: 55 mg/dL (ref 0–99)
TRIGLYCERIDES: 48 mg/dL (ref 0–149)
VLDL Cholesterol Cal: 10 mg/dL (ref 5–40)

## 2018-06-06 LAB — HEPATIC FUNCTION PANEL
ALT: 25 IU/L (ref 0–44)
AST: 30 IU/L (ref 0–40)
Albumin: 4.3 g/dL (ref 3.8–4.8)
Alkaline Phosphatase: 51 IU/L (ref 39–117)
Bilirubin Total: 0.6 mg/dL (ref 0.0–1.2)
Bilirubin, Direct: 0.17 mg/dL (ref 0.00–0.40)
Total Protein: 6.5 g/dL (ref 6.0–8.5)

## 2018-06-06 LAB — BMP8+EGFR
BUN / CREAT RATIO: 13 (ref 10–24)
BUN: 15 mg/dL (ref 8–27)
CALCIUM: 9.6 mg/dL (ref 8.6–10.2)
CO2: 22 mmol/L (ref 20–29)
Chloride: 105 mmol/L (ref 96–106)
Creatinine, Ser: 1.16 mg/dL (ref 0.76–1.27)
GFR calc non Af Amer: 64 mL/min/{1.73_m2} (ref >59)
GFR, EST AFRICAN AMERICAN: 74 mL/min/{1.73_m2} (ref >59)
Glucose: 124 mg/dL — ABNORMAL HIGH (ref 65–99)
Potassium: 4.2 mmol/L (ref 3.5–5.2)
Sodium: 142 mmol/L (ref 134–144)

## 2018-06-06 LAB — VITAMIN D 25 HYDROXY (VIT D DEFICIENCY, FRACTURES): Vit D, 25-Hydroxy: 56.9 ng/mL (ref 30.0–100.0)

## 2018-06-11 ENCOUNTER — Ambulatory Visit (INDEPENDENT_AMBULATORY_CARE_PROVIDER_SITE_OTHER): Payer: Medicare Other | Admitting: *Deleted

## 2018-06-11 VITALS — BP 121/69 | HR 77 | Temp 96.8°F | Ht 69.0 in | Wt 210.0 lb

## 2018-06-11 DIAGNOSIS — Z0000 Encounter for general adult medical examination without abnormal findings: Secondary | ICD-10-CM

## 2018-06-11 DIAGNOSIS — Z1211 Encounter for screening for malignant neoplasm of colon: Secondary | ICD-10-CM | POA: Diagnosis not present

## 2018-06-11 NOTE — Progress Notes (Addendum)
Subjective:   BARTOW ZYLSTRA is a 68 y.o. male who presents for Medicare Annual/Subsequent preventive examination. He is retired from [employer]. He enjoys fishing, Biomedical engineer and used to Merchandiser, retail. He is currently not doing a regular exercise regimen, but states that he is constantly on the go. He says that he eats a semi-healthy diet and he typically gets in 3 meals a day. He does attend church and he is married. He lives with his wife and daughter, and his grandchildren live there as well every other week. He has a son as well, but he lives in [place]. He does not have pets and fall hazards were discussed at length. He states that his health is better than it was a year ago.         Objective:    Vitals: BP 121/69 (BP Location: Left Arm)   Pulse 77   Temp (!) 96.8 F (36 C) (Oral)   Ht 5\' 9"  (1.753 m)   Wt 210 lb (95.3 kg)   BMI 31.01 kg/m   Body mass index is 31.01 kg/m.  Advanced Directives 06/11/2018 06/05/2016 07/23/2012 07/14/2012 07/12/2012  Does Patient Have a Medical Advance Directive? Yes Yes Patient does not have advance directive;Patient would like information Patient does not have advance directive Patient does not have advance directive  Type of Advance Directive Living will;Healthcare Power of [place] in Chart? No - copy requested - - - -  Would patient like information on creating a medical advance directive? - - Advance directive packet given - -  Pre-existing out of facility DNR order (yellow form or pink MOST form) - - No - -    Tobacco Social History   Tobacco Use  Smoking Status Never Smoker  Smokeless Tobacco Never Used     Counseling given: Not Answered   Past Medical History:  Diagnosis Date  . Allergy    seasonal   . Back pain   . Bee sting allergy   . BPH (benign prostatic hypertrophy)   . Diabetes mellitus without complication ([place])   . Gastritis   . GERD (gastroesophageal reflux disease)   .  Herniated disc    c7-c7, c5-6, c3-4, c4-5  . Hypercholesteremia   . Hypertension   . Nephrolithiasis   . Obesity   . PONV (postoperative nausea and vomiting)   . Reflux   . Vitamin D deficiency    Past Surgical History:  Procedure Laterality Date  . FRACTURE SURGERY     right hip   . INGUINAL HERNIA REPAIR     right  . kidney stones     x2 Dr. Rosana Hoes   . OPEN REDUCTION INTERNAL FIXATION (ORIF) DISTAL RADIAL FRACTURE Right 07/11/2012   Procedure: OPEN REDUCTION INTERNAL FIXATION (ORIF) DISTAL RADIAL FRACTURE;  Surgeon: Schuyler Amor, MD;  Location: [facility];  Service: Orthopedics;  Laterality: Right;  . ORIF ACETABULAR FRACTURE Right 07/14/2012   Procedure: OPEN REDUCTION INTERNAL FIXATION (ORIF) ACETABULAR FRACTURE;  Surgeon: Rozanna Box, MD;  Location: [facility];  Service: Orthopedics;  Laterality: Right;  . ORIF R elbow  07/12/2012  . ORIF R wirst  07/12/2012  . ORIF RADIAL FRACTURE Right 07/11/2012   Procedure: OPEN REDUCTION INTERNAL FIXATION (ORIF) RADIAL head FRACTURE;  Surgeon: Schuyler Amor, MD;  Location: [facility];  Service: Orthopedics;  Laterality: Right;   Family History  Problem Relation Age of Onset  . Heart attack Mother  58  . Diabetes Mother        NIDDM  . Hypertension Mother   . Hypertension Father   . Stroke Father   . Nephrolithiasis Brother   . Cancer Sister        unsure - ? cervical   . Diabetes Sister   . Diabetes Sister   . Anxiety disorder Son        depression   . ADD / ADHD Son   . Cancer Maternal Grandmother        unknown  . Heart attack Maternal Grandfather   . CAD Paternal Grandmother    Social History   Socioeconomic History  . Marital status: Married    Spouse name: Kenney Houseman  . Number of children: 2  . Years of education: Not on file  . Highest education level: Not on file  Occupational History  . Occupation: retired    Comment: [employer]  Social Needs  . Financial resource strain: Not on file  . Food insecurity:    Worry: Not  on file    Inability: Not on file  . Transportation needs:    Medical: Not on file    Non-medical: Not on file  Tobacco Use  . Smoking status: Never Smoker  . Smokeless tobacco: Never Used  Substance and Sexual Activity  . Alcohol use: Yes    Comment: social  . Drug use: No  . Sexual activity: Yes  Lifestyle  . Physical activity:    Days per week: Not on file    Minutes per session: Not on file  . Stress: Not on file  Relationships  . Social connections:    Talks on phone: Not on file    Gets together: Not on file    Attends religious service: Not on file    Active member of club or organization: Not on file    Attends meetings of clubs or organizations: Not on file    Relationship status: Not on file  Other Topics Concern  . 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     Outpatient Encounter Medications as of 06/11/2018  Medication Sig  . atorvastatin (LIPITOR) 40 MG tablet TAKE ONE (1) TABLET EACH DAY  . fenofibrate 160 MG tablet TAKE ONE (1) TABLET EACH DAY  . finasteride (PROSCAR) 5 MG tablet TAKE ONE (1) TABLET EACH DAY  . loratadine (CLARITIN) 10 MG tablet Take 10 mg by mouth as needed for allergies.  Marland Kitchen losartan-hydrochlorothiazide (HYZAAR) 100-12.5 MG tablet TAKE ONE (1) TABLET EACH DAY  . metFORMIN (GLUCOPHAGE-XR) 500 MG 24 hr tablet TAKE 1 TABLET EVERY MORNING WITH BREAKFAST  . omeprazole (PRILOSEC) 20 MG capsule TAKE ONE (1) CAPSULE EACH DAY  . tamsulosin (FLOMAX) 0.4 MG CAPS capsule TAKE ONE (1) CAPSULE EACH DAY  . Vitamin D, Ergocalciferol, (DRISDOL) 50000 units CAPS capsule TAKE 1 CAPSULE EVERY WEEK   No facility-administered encounter medications on file as of 06/11/2018.     Activities of Daily Living In your present state of health, do you have any difficulty performing the following activities: 06/11/2018  Hearing? Y  Vision? N  Difficulty concentrating or making decisions? N  Walking or climbing stairs?  N  Dressing or bathing? N  Doing errands, shopping? N  Preparing Food and eating ? N  Using the Toilet? N  In the past six months, have you accidently leaked urine? N  Do you have problems with loss of bowel  control? N  Managing your Medications? N  Managing your Finances? N  Housekeeping or managing your Housekeeping? N  Some recent data might be hidden    Patient Care Team: Chipper Herb, MD as PCP - General (Family Medicine) Harlen Labs, MD as Referring Physician (Optometry) Myrlene Broker, MD as Attending Physician (Urology) Pyrtle, Lajuan Lines, MD as Consulting Physician (Gastroenterology)   Assessment:   This is a routine wellness examination for Gwyndolyn Saxon.  Exercise Activities and Dietary recommendations Current Exercise Habits: The patient does not participate in regular exercise at present  Goals    . Increase physical activity    . Weight (lb) < 199 lb (90.3 kg)       Fall Risk Fall Risk  06/11/2018 06/05/2018 02/04/2018 09/24/2017 04/29/2017  Falls in the past year? 0 0 No No No   Is the patient's home free of loose throw rugs in walkways, pet beds, electrical cords, etc? Fall hazards and risks were discussed.   Depression Screen PHQ 2/9 Scores 06/11/2018 06/05/2018 02/04/2018 09/24/2017  PHQ - 2 Score 0 0 0 0    Cognitive Function MMSE - Mini Mental State Exam 06/11/2018  Orientation to time 5  Orientation to Place 5  Registration 3  Attention/ Calculation 5  Recall 3  Language- name 2 objects 2  Language- repeat 1  Language- follow 3 step command 3  Language- read & follow direction 1  Write a sentence 1  Copy design 1  Total score 30        Immunization History  Administered Date(s) Administered  . Influenza Split 07/25/2012  . Influenza, High Dose Seasonal PF 04/01/2016, 04/29/2017, 02/04/2018  . Influenza,inj,Quad PF,6+ Mos 03/11/2013, 02/25/2014, 06/28/2015  . Pneumococcal Conjugate-13 04/01/2013  . Pneumococcal Polysaccharide-23 07/25/2012     Qualifies for Shingles Vaccine? Declined   Screening Tests Health Maintenance  Topic Date Due  . Hepatitis C Screening  Aug 07, 1950  . OPHTHALMOLOGY EXAM  09/10/2016  . PNA vac Low Risk Adult (2 of 2 - PPSV23) 07/25/2017  . COLON CANCER SCREENING ANNUAL FOBT  07/10/2018  . HEMOGLOBIN A1C  12/04/2018  . FOOT EXAM  06/06/2019  . TETANUS/TDAP  05/13/2021  . COLONOSCOPY  06/05/2026  . INFLUENZA VACCINE  Completed   Cancer Screenings: Lung: Low Dose CT Chest recommended if Age 67-80 years, 30 pack-year currently smoking OR have quit w/in 15years. Patient does not qualify. Colorectal: done, due at next OV  Additional Screenings: declined  Hepatitis C Screening:      Plan:   pt is to keep follow up with Dr Laurance Flatten and other specialist. He will work on his weight and getting more active. He is up-to-date on HM  I have personally reviewed and noted the following in the patient's chart:   . Medical and social history . Use of alcohol, tobacco or illicit drugs  . Current medications and supplements . Functional ability and status . Nutritional status . Physical activity . Advanced directives . List of other physicians . Hospitalizations, surgeries, and ER visits in previous 12 months . Vitals . Screenings to include cognitive, depression, and falls . Referrals and appointments  In addition, I have reviewed and discussed with patient certain preventive protocols, quality metrics, and best practice recommendations. A written personalized care plan for preventive services as well as general preventive health recommendations were provided to patient.     Feliberto Stockley, Cameron Proud, LPN  [id number]  I have reviewed and agree with the above AWV documentation.  Mary-Margaret Martin, FNP   

## 2018-06-11 NOTE — Addendum Note (Signed)
Addended by: Zannie Cove on: 06/11/2018 10:45 AM   Modules accepted: Orders

## 2018-06-11 NOTE — Patient Instructions (Signed)
  [name] , Thank you for taking time to come for your Medicare Wellness Visit. I appreciate your ongoing commitment to your health goals. Please review the following plan we discussed and let me know if I can assist you in the future.   These are the goals we discussed: Goals    . Increase physical activity    . Weight (lb) < 199 lb (90.3 kg)       This is a list of the screening recommended for you and due dates:  Health Maintenance  Topic Date Due  .  Hepatitis C: One time screening is recommended by Center for Disease Control  (CDC) for  adults born from [birth year] through 1966.   03-22-1951  . Eye exam for diabetics  09/10/2016  . Pneumonia vaccines (2 of 2 - PPSV23) 07/25/2017  . Stool Blood Test  07/10/2018  . Hemoglobin A1C  12/04/2018  . Complete foot exam   06/06/2019  . Tetanus Vaccine  05/13/2021  . Colon Cancer Screening  06/05/2026  . Flu Shot  Completed    Keep follow up with Dr Laurance Flatten and other specialist Try and work on goals

## 2018-06-12 LAB — FECAL OCCULT BLOOD, IMMUNOCHEMICAL: Fecal Occult Bld: NEGATIVE

## 2018-07-20 ENCOUNTER — Other Ambulatory Visit: Payer: Self-pay | Admitting: Family Medicine

## 2018-08-31 ENCOUNTER — Other Ambulatory Visit: Payer: Self-pay | Admitting: Family Medicine

## 2018-09-29 ENCOUNTER — Other Ambulatory Visit: Payer: Self-pay | Admitting: Family Medicine

## 2018-09-29 DIAGNOSIS — I10 Essential (primary) hypertension: Secondary | ICD-10-CM

## 2018-10-22 ENCOUNTER — Encounter: Payer: Self-pay | Admitting: Family Medicine

## 2018-10-22 ENCOUNTER — Other Ambulatory Visit: Payer: Self-pay

## 2018-10-22 ENCOUNTER — Ambulatory Visit (INDEPENDENT_AMBULATORY_CARE_PROVIDER_SITE_OTHER): Payer: Medicare Other | Admitting: Family Medicine

## 2018-10-22 DIAGNOSIS — K219 Gastro-esophageal reflux disease without esophagitis: Secondary | ICD-10-CM

## 2018-10-22 DIAGNOSIS — N400 Enlarged prostate without lower urinary tract symptoms: Secondary | ICD-10-CM | POA: Diagnosis not present

## 2018-10-22 DIAGNOSIS — I10 Essential (primary) hypertension: Secondary | ICD-10-CM

## 2018-10-22 DIAGNOSIS — E1140 Type 2 diabetes mellitus with diabetic neuropathy, unspecified: Secondary | ICD-10-CM

## 2018-10-22 DIAGNOSIS — E559 Vitamin D deficiency, unspecified: Secondary | ICD-10-CM

## 2018-10-22 DIAGNOSIS — N200 Calculus of kidney: Secondary | ICD-10-CM

## 2018-10-22 DIAGNOSIS — E782 Mixed hyperlipidemia: Secondary | ICD-10-CM

## 2018-10-22 DIAGNOSIS — Z0000 Encounter for general adult medical examination without abnormal findings: Secondary | ICD-10-CM

## 2018-10-22 NOTE — Addendum Note (Signed)
Addended by: Zannie Cove on: 10/22/2018 01:44 PM   Modules accepted: Orders
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
## 2018-10-22 NOTE — Patient Instructions (Addendum)
Follow-up with Dr. Lawerance Bach if needed for kidney stones Schedule appointment with provider office and make sure that you get a rectal exam done at that visit. Continue to practice good hand and respiratory hygiene Drink plenty of water and fluids Do not put yourself at risk and do not climb! Check blood sugars regularly fasting and before eating and always bring readings and to the visit that you have checked at home Check blood pressures more regularly and bring these readings also with you to the visits in our office Get eye exams yearly Next colonoscopy will be due and January 2028

## 2018-10-26 ENCOUNTER — Other Ambulatory Visit: Payer: Self-pay | Admitting: Family Medicine

## 2018-10-26 DIAGNOSIS — N400 Enlarged prostate without lower urinary tract symptoms: Secondary | ICD-10-CM

## 2018-10-26 DIAGNOSIS — E119 Type 2 diabetes mellitus without complications: Secondary | ICD-10-CM

## 2018-10-26 DIAGNOSIS — E559 Vitamin D deficiency, unspecified: Secondary | ICD-10-CM

## 2018-10-28 ENCOUNTER — Other Ambulatory Visit: Payer: Medicare Other

## 2018-10-28 ENCOUNTER — Other Ambulatory Visit: Payer: Self-pay

## 2018-10-28 DIAGNOSIS — I10 Essential (primary) hypertension: Secondary | ICD-10-CM | POA: Diagnosis not present

## 2018-10-28 DIAGNOSIS — E782 Mixed hyperlipidemia: Secondary | ICD-10-CM

## 2018-10-28 DIAGNOSIS — K219 Gastro-esophageal reflux disease without esophagitis: Secondary | ICD-10-CM

## 2018-10-28 DIAGNOSIS — E559 Vitamin D deficiency, unspecified: Secondary | ICD-10-CM

## 2018-10-28 DIAGNOSIS — N400 Enlarged prostate without lower urinary tract symptoms: Secondary | ICD-10-CM | POA: Diagnosis not present

## 2018-10-28 DIAGNOSIS — E1140 Type 2 diabetes mellitus with diabetic neuropathy, unspecified: Secondary | ICD-10-CM | POA: Diagnosis not present

## 2018-10-28 DIAGNOSIS — N200 Calculus of kidney: Secondary | ICD-10-CM

## 2018-10-28 DIAGNOSIS — Z0000 Encounter for general adult medical examination without abnormal findings: Secondary | ICD-10-CM

## 2018-10-28 LAB — BAYER DCA HB A1C WAIVED: HB A1C (BAYER DCA - WAIVED): 6.8 % (ref <7.0)

## 2018-10-29 LAB — BMP8+EGFR
BUN/Creatinine Ratio: 18 (ref 10–24)
BUN: 20 mg/dL (ref 8–27)
CO2: 24 mmol/L (ref 20–29)
Calcium: 9.6 mg/dL (ref 8.6–10.2)
Chloride: 108 mmol/L — ABNORMAL HIGH (ref 96–106)
Creatinine, Ser: 1.12 mg/dL (ref 0.76–1.27)
GFR calc Af Amer: 77 mL/min/{1.73_m2} (ref >59)
GFR calc non Af Amer: 67 mL/min/{1.73_m2} (ref >59)
Glucose: 97 mg/dL (ref 65–99)
Potassium: 3.9 mmol/L (ref 3.5–5.2)
Sodium: 144 mmol/L (ref 134–144)

## 2018-10-29 LAB — HEPATIC FUNCTION PANEL
ALT: 19 IU/L (ref 0–44)
AST: 21 IU/L (ref 0–40)
Albumin: 4.1 g/dL (ref 3.8–4.8)
Alkaline Phosphatase: 51 IU/L (ref 39–117)
Bilirubin Total: 0.2 mg/dL (ref 0.0–1.2)
Bilirubin, Direct: 0.09 mg/dL (ref 0.00–0.40)
Total Protein: 6.3 g/dL (ref 6.0–8.5)

## 2018-10-29 LAB — CBC WITH DIFFERENTIAL/PLATELET
Basophils Absolute: 0.1 10*3/uL (ref 0.0–0.2)
Basos: 1 %
EOS (ABSOLUTE): 0.3 10*3/uL (ref 0.0–0.4)
Eos: 3 %
Hematocrit: 39.8 % (ref 37.5–51.0)
Hemoglobin: 13.4 g/dL (ref 13.0–17.7)
Immature Grans (Abs): 0 10*3/uL (ref 0.0–0.1)
Immature Granulocytes: 0 %
Lymphocytes Absolute: 2.2 10*3/uL (ref 0.7–3.1)
Lymphs: 30 %
MCH: 31.1 pg (ref 26.6–33.0)
MCHC: 33.7 g/dL (ref 31.5–35.7)
MCV: 92 fL (ref 79–97)
Monocytes Absolute: 0.6 10*3/uL (ref 0.1–0.9)
Monocytes: 8 %
Neutrophils Absolute: 4.3 10*3/uL (ref 1.4–7.0)
Neutrophils: 58 %
Platelets: 182 10*3/uL (ref 150–450)
RBC: 4.31 x10E6/uL (ref 4.14–5.80)
RDW: 12.7 % (ref 11.6–15.4)
WBC: 7.3 10*3/uL (ref 3.4–10.8)

## 2018-10-29 LAB — LIPID PANEL
Chol/HDL Ratio: 2.5 ratio (ref 0.0–5.0)
Cholesterol, Total: 119 mg/dL (ref 100–199)
HDL: 48 mg/dL (ref >39)
LDL Calculated: 57 mg/dL (ref 0–99)
Triglycerides: 69 mg/dL (ref 0–149)
VLDL Cholesterol Cal: 14 mg/dL (ref 5–40)

## 2018-10-29 LAB — PSA, TOTAL AND FREE
PSA, Free Pct: 47.5 %
PSA, Free: 0.19 ng/mL
Prostate Specific Ag, Serum: 0.4 ng/mL (ref 0.0–4.0)

## 2018-10-29 LAB — MICROALBUMIN / CREATININE URINE RATIO
Creatinine, Urine: 56.7 mg/dL
Microalb/Creat Ratio: <5 mg/g creat (ref 0–29)
Microalbumin, Urine: <3 ug/mL

## 2018-10-29 LAB — THYROID PANEL WITH TSH
Free Thyroxine Index: 1.8 (ref 1.2–4.9)
T3 Uptake Ratio: 27 % (ref 24–39)
T4, Total: 6.7 ug/dL (ref 4.5–12.0)
TSH: 2.54 u[IU]/mL (ref 0.450–4.500)

## 2018-10-29 LAB — VITAMIN D 25 HYDROXY (VIT D DEFICIENCY, FRACTURES): Vit D, 25-Hydroxy: 52.8 ng/mL (ref 30.0–100.0)

## 2018-11-30 ENCOUNTER — Other Ambulatory Visit: Payer: Self-pay | Admitting: Family Medicine

## 2018-12-15 ENCOUNTER — Other Ambulatory Visit: Payer: Self-pay | Admitting: Family Medicine

## 2018-12-15 DIAGNOSIS — I10 Essential (primary) hypertension: Secondary | ICD-10-CM

## 2019-01-19 ENCOUNTER — Other Ambulatory Visit: Payer: Self-pay | Admitting: Family Medicine

## 2019-01-19 DIAGNOSIS — E559 Vitamin D deficiency, unspecified: Secondary | ICD-10-CM

## 2019-01-19 DIAGNOSIS — N400 Enlarged prostate without lower urinary tract symptoms: Secondary | ICD-10-CM

## 2019-01-19 DIAGNOSIS — E119 Type 2 diabetes mellitus without complications: Secondary | ICD-10-CM

## 2019-02-25 ENCOUNTER — Other Ambulatory Visit: Payer: Self-pay | Admitting: Family Medicine

## 2019-02-26 ENCOUNTER — Other Ambulatory Visit: Payer: Self-pay | Admitting: Family Medicine

## 2019-03-23 ENCOUNTER — Other Ambulatory Visit: Payer: Self-pay | Admitting: Family Medicine

## 2019-03-23 DIAGNOSIS — I10 Essential (primary) hypertension: Secondary | ICD-10-CM

## 2019-04-21 ENCOUNTER — Other Ambulatory Visit: Payer: Self-pay | Admitting: Family Medicine

## 2019-04-21 DIAGNOSIS — E119 Type 2 diabetes mellitus without complications: Secondary | ICD-10-CM

## 2019-04-26 ENCOUNTER — Other Ambulatory Visit: Payer: Self-pay | Admitting: Family Medicine

## 2019-04-26 NOTE — Telephone Encounter (Signed)
Needs to be seen by a new PCP. No more refills until then

## 2019-04-29 ENCOUNTER — Telehealth: Payer: Self-pay | Admitting: Family Medicine

## 2019-04-29 DIAGNOSIS — L821 Other seborrheic keratosis: Secondary | ICD-10-CM | POA: Diagnosis not present

## 2019-04-29 DIAGNOSIS — D225 Melanocytic nevi of trunk: Secondary | ICD-10-CM | POA: Diagnosis not present

## 2019-04-29 DIAGNOSIS — L570 Actinic keratosis: Secondary | ICD-10-CM | POA: Diagnosis not present

## 2019-04-29 DIAGNOSIS — X32XXXA Exposure to sunlight, initial encounter: Secondary | ICD-10-CM | POA: Diagnosis not present

## 2019-04-29 NOTE — Telephone Encounter (Signed)
Patient wanted to be seen by Dr. Warrick Parisian but I explained to him that he is not accepting any new patients at this time.  We scheduled him an appointment for a 6 month follow up with Monia Pouch on 05/24/2019.

## 2019-05-21 ENCOUNTER — Other Ambulatory Visit: Payer: Self-pay

## 2019-05-24 ENCOUNTER — Ambulatory Visit (INDEPENDENT_AMBULATORY_CARE_PROVIDER_SITE_OTHER): Payer: Medicare Other | Admitting: Family Medicine

## 2019-05-24 ENCOUNTER — Other Ambulatory Visit: Payer: Self-pay

## 2019-05-24 ENCOUNTER — Encounter: Payer: Self-pay | Admitting: Family Medicine

## 2019-05-24 VITALS — BP 131/80 | HR 99 | Temp 97.3°F | Resp 20 | Ht 69.0 in | Wt 210.0 lb

## 2019-05-24 DIAGNOSIS — Z23 Encounter for immunization: Secondary | ICD-10-CM | POA: Diagnosis not present

## 2019-05-24 DIAGNOSIS — I10 Essential (primary) hypertension: Secondary | ICD-10-CM | POA: Diagnosis not present

## 2019-05-24 DIAGNOSIS — E1169 Type 2 diabetes mellitus with other specified complication: Secondary | ICD-10-CM | POA: Diagnosis not present

## 2019-05-24 DIAGNOSIS — E785 Hyperlipidemia, unspecified: Secondary | ICD-10-CM

## 2019-05-24 DIAGNOSIS — E1140 Type 2 diabetes mellitus with diabetic neuropathy, unspecified: Secondary | ICD-10-CM | POA: Diagnosis not present

## 2019-05-24 DIAGNOSIS — N400 Enlarged prostate without lower urinary tract symptoms: Secondary | ICD-10-CM

## 2019-05-24 DIAGNOSIS — E782 Mixed hyperlipidemia: Secondary | ICD-10-CM | POA: Diagnosis not present

## 2019-05-24 DIAGNOSIS — J310 Chronic rhinitis: Secondary | ICD-10-CM | POA: Insufficient documentation

## 2019-05-24 DIAGNOSIS — K219 Gastro-esophageal reflux disease without esophagitis: Secondary | ICD-10-CM | POA: Diagnosis not present

## 2019-05-24 DIAGNOSIS — E1159 Type 2 diabetes mellitus with other circulatory complications: Secondary | ICD-10-CM | POA: Diagnosis not present

## 2019-05-24 DIAGNOSIS — E559 Vitamin D deficiency, unspecified: Secondary | ICD-10-CM

## 2019-05-24 LAB — CBC WITH DIFFERENTIAL/PLATELET
Basophils Absolute: 0.1 10*3/uL (ref 0.0–0.2)
Basos: 1 %
EOS (ABSOLUTE): 0.2 10*3/uL (ref 0.0–0.4)
Eos: 3 %
Hematocrit: 41.7 % (ref 37.5–51.0)
Hemoglobin: 14.6 g/dL (ref 13.0–17.7)
Immature Grans (Abs): 0 10*3/uL (ref 0.0–0.1)
Immature Granulocytes: 0 %
Lymphocytes Absolute: 1.7 10*3/uL (ref 0.7–3.1)
Lymphs: 29 %
MCH: 31.5 pg (ref 26.6–33.0)
MCHC: 35 g/dL (ref 31.5–35.7)
MCV: 90 fL (ref 79–97)
Monocytes Absolute: 0.5 10*3/uL (ref 0.1–0.9)
Monocytes: 8 %
Neutrophils Absolute: 3.4 10*3/uL (ref 1.4–7.0)
Neutrophils: 59 %
Platelets: 212 10*3/uL (ref 150–450)
RBC: 4.64 x10E6/uL (ref 4.14–5.80)
RDW: 12.4 % (ref 11.6–15.4)
WBC: 5.8 10*3/uL (ref 3.4–10.8)

## 2019-05-24 LAB — CMP14+EGFR
ALT: 22 IU/L (ref 0–44)
AST: 20 IU/L (ref 0–40)
Albumin/Globulin Ratio: 1.8 (ref 1.2–2.2)
Albumin: 4.2 g/dL (ref 3.8–4.8)
Alkaline Phosphatase: 56 IU/L (ref 39–117)
BUN/Creatinine Ratio: 14 (ref 10–24)
BUN: 17 mg/dL (ref 8–27)
Bilirubin Total: 0.5 mg/dL (ref 0.0–1.2)
CO2: 22 mmol/L (ref 20–29)
Calcium: 9.6 mg/dL (ref 8.6–10.2)
Chloride: 106 mmol/L (ref 96–106)
Creatinine, Ser: 1.22 mg/dL (ref 0.76–1.27)
GFR calc Af Amer: 69 mL/min/{1.73_m2} (ref >59)
GFR calc non Af Amer: 60 mL/min/{1.73_m2} (ref >59)
Globulin, Total: 2.4 g/dL (ref 1.5–4.5)
Glucose: 152 mg/dL — ABNORMAL HIGH (ref 65–99)
Potassium: 4.4 mmol/L (ref 3.5–5.2)
Sodium: 141 mmol/L (ref 134–144)
Total Protein: 6.6 g/dL (ref 6.0–8.5)

## 2019-05-24 LAB — LIPID PANEL
Chol/HDL Ratio: 2.6 ratio (ref 0.0–5.0)
Cholesterol, Total: 127 mg/dL (ref 100–199)
HDL: 49 mg/dL (ref >39)
LDL Chol Calc (NIH): 66 mg/dL (ref 0–99)
Triglycerides: 54 mg/dL (ref 0–149)
VLDL Cholesterol Cal: 12 mg/dL (ref 5–40)

## 2019-05-24 LAB — BAYER DCA HB A1C WAIVED: HB A1C (BAYER DCA - WAIVED): 7 % — ABNORMAL HIGH (ref <7.0)

## 2019-05-24 MED ORDER — FENOFIBRATE 160 MG PO TABS: ORAL_TABLET | ORAL | 1 refills | Status: DC

## 2019-05-24 MED ORDER — OMEPRAZOLE 20 MG PO CPDR: 20.0000 mg | DELAYED_RELEASE_CAPSULE | Freq: Every day | ORAL | 0 refills | Status: DC

## 2019-05-24 MED ORDER — LORATADINE 10 MG PO TABS: 10.0000 mg | ORAL_TABLET | ORAL | 5 refills | Status: DC | PRN

## 2019-05-24 MED ORDER — LOSARTAN POTASSIUM-HCTZ 100-12.5 MG PO TABS: 1.0000 | ORAL_TABLET | Freq: Every day | ORAL | 1 refills | Status: DC

## 2019-05-24 MED ORDER — TAMSULOSIN HCL 0.4 MG PO CAPS: 0.4000 mg | ORAL_CAPSULE | Freq: Every day | ORAL | 0 refills | Status: DC

## 2019-05-24 MED ORDER — ATORVASTATIN CALCIUM 40 MG PO TABS: ORAL_TABLET | ORAL | 5 refills | Status: DC

## 2019-05-24 MED ORDER — FLUTICASONE PROPIONATE 50 MCG/ACT NA SUSP: 2.0000 | Freq: Every day | NASAL | 6 refills | Status: DC

## 2019-05-24 MED ORDER — VITAMIN D (ERGOCALCIFEROL) 1.25 MG (50000 UNIT) PO CAPS: 50000.0000 [IU] | ORAL_CAPSULE | ORAL | 1 refills | Status: DC

## 2019-05-24 MED ORDER — METFORMIN HCL ER 500 MG PO TB24: ORAL_TABLET | ORAL | 0 refills | Status: DC

## 2019-05-24 MED ORDER — FINASTERIDE 5 MG PO TABS: 5.0000 mg | ORAL_TABLET | Freq: Every day | ORAL | 1 refills | Status: DC

## 2019-05-24 NOTE — Progress Notes (Deleted)
Subjective:  Patient ID: Roger Palmer, male    DOB: [birth year], 69 y.o.   MRN: [id number]  Patient Care Team: Baruch Gouty, FNP as PCP - General (Family Medicine) Harlen Labs, MD as Referring Physician (Optometry) Myrlene Broker, MD as Attending Physician (Urology) Pyrtle, Lajuan Lines, MD as Consulting Physician (Gastroenterology)   Chief Complaint:  Medical Management of Chronic Issues (6 mo ), Diabetes, Hyperlipidemia, and Hypertension   HPI: DAEVON Palmer is a 69 y.o. male presenting on 05/24/2019 for Medical Management of Chronic Issues (6 mo ), Diabetes, Hyperlipidemia, and Hypertension   HPI 1. Type 2 diabetes mellitus with diabetic neuropathy, without long-term current use of insulin (HCC) ***  2. Essential hypertension ***  3. Gastroesophageal reflux disease without esophagitis ***  4. Mixed hyperlipidemia ***  5. Benign prostatic hyperplasia, unspecified whether lower urinary tract symptoms present ***  6. Diabetes mellitus without complication (HCC) ***  7. Vitamin D deficiency ***  8. Chronic rhinitis ***     Relevant past medical, surgical, family, and social history reviewed and updated as indicated.  Allergies and medications reviewed and updated. Date reviewed: Chart in Epic.   Past Medical History:  Diagnosis Date  . Allergy    seasonal   . Back pain   . Bee sting allergy   . BPH (benign prostatic hypertrophy)   . Diabetes mellitus without complication (Roger Palmer)   . Gastritis   . GERD (gastroesophageal reflux disease)   . Herniated disc    c7-c7, c5-6, c3-4, c4-5  . Hypercholesteremia   . Hypertension   . Nephrolithiasis   . Obesity   . PONV (postoperative nausea and vomiting)   . Reflux   . Vitamin D deficiency     Past Surgical History:  Procedure Laterality Date  . FRACTURE SURGERY     right hip   . INGUINAL HERNIA REPAIR     right  . kidney stones     x2 Dr. Rosana Hoes   . OPEN REDUCTION INTERNAL FIXATION (ORIF)  DISTAL RADIAL FRACTURE Right 07/11/2012   Procedure: OPEN REDUCTION INTERNAL FIXATION (ORIF) DISTAL RADIAL FRACTURE;  Surgeon: Schuyler Amor, MD;  Location: [facility];  Service: Orthopedics;  Laterality: Right;  . ORIF ACETABULAR FRACTURE Right 07/14/2012   Procedure: OPEN REDUCTION INTERNAL FIXATION (ORIF) ACETABULAR FRACTURE;  Surgeon: Rozanna Box, MD;  Location: [facility];  Service: Orthopedics;  Laterality: Right;  . ORIF R elbow  07/12/2012  . ORIF R wirst  07/12/2012  . ORIF RADIAL FRACTURE Right 07/11/2012   Procedure: OPEN REDUCTION INTERNAL FIXATION (ORIF) RADIAL head FRACTURE;  Surgeon: Schuyler Amor, MD;  Location: [facility];  Service: Orthopedics;  Laterality: Right;    Social History   Socioeconomic History  . Marital status: Married    Spouse name: Roger Palmer  . Number of children: 2  . Years of education: Not on file  . Highest education level: Not on file  Occupational History  . Occupation: retired    Comment: [employer]  Tobacco Use  . Smoking status: Never Smoker  . Smokeless tobacco: Never Used  Substance and Sexual Activity  . Alcohol use: Yes    Comment: social  . Drug use: No  . Sexual activity: Yes  Other Topics Concern  . 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    Social Determinants of Health   Financial Resource Strain:   . Difficulty of  Paying Living Expenses: Not on file  Food Insecurity:   . Worried About Charity fundraiser in the Last Year: Not on file  . Ran Out of Food in the Last Year: Not on file  Transportation Needs:   . Lack of Transportation (Medical): Not on file  . Lack of Transportation (Non-Medical): Not on file  Physical Activity:   . Days of Exercise per Week: Not on file  . Minutes of Exercise per Session: Not on file  Stress:   . Feeling of Stress : Not on file  Social Connections:   . Frequency of Communication with Friends and Family: Not on file  . Frequency of Social  Gatherings with Friends and Family: Not on file  . Attends Religious Services: Not on file  . Active Member of Clubs or Organizations: Not on file  . Attends Archivist Meetings: Not on file  . Marital Status: Not on file  Intimate Partner Violence:   . Fear of Current or Ex-Partner: Not on file  . Emotionally Abused: Not on file  . Physically Abused: Not on file  . Sexually Abused: Not on file    Outpatient Encounter Medications as of 05/24/2019  Medication Sig  . atorvastatin (LIPITOR) 40 MG tablet TAKE ONE (1) TABLET EACH DAY  . fenofibrate 160 MG tablet TAKE ONE (1) TABLET EACH DAY  . finasteride (PROSCAR) 5 MG tablet Take 1 tablet (5 mg total) by mouth daily.  Marland Kitchen loratadine (CLARITIN) 10 MG tablet Take 1 tablet (10 mg total) by mouth as needed for allergies.  Marland Kitchen losartan-hydrochlorothiazide (HYZAAR) 100-12.5 MG tablet Take 1 tablet by mouth daily.  . metFORMIN (GLUCOPHAGE-XR) 500 MG 24 hr tablet (Needs to be seen before next refill)TAKE 1 TABLET EVERY MORNING WITH BREAKFAST  . omeprazole (PRILOSEC) 20 MG capsule Take 1 capsule (20 mg total) by mouth daily. (Needs to be seen before next refill)  . tamsulosin (FLOMAX) 0.4 MG CAPS capsule Take 1 capsule (0.4 mg total) by mouth daily. (Needs to be seen before next refill)  . Vitamin D, Ergocalciferol, (DRISDOL) 1.25 MG (50000 UNIT) CAPS capsule Take 1 capsule (50,000 Units total) by mouth every 7 (seven) days.  . [DISCONTINUED] atorvastatin (LIPITOR) 40 MG tablet TAKE ONE (1) TABLET EACH DAY  . [DISCONTINUED] fenofibrate 160 MG tablet TAKE ONE (1) TABLET EACH DAY  . [DISCONTINUED] finasteride (PROSCAR) 5 MG tablet TAKE ONE (1) TABLET EACH DAY  . [DISCONTINUED] loratadine (CLARITIN) 10 MG tablet Take 10 mg by mouth as needed for allergies.  . [DISCONTINUED] losartan-hydrochlorothiazide (HYZAAR) 100-12.5 MG tablet TAKE ONE (1) TABLET EACH DAY  . [DISCONTINUED] metFORMIN (GLUCOPHAGE-XR) 500 MG 24 hr tablet (Needs to be seen before  next refill)TAKE 1 [place]  . [DISCONTINUED] omeprazole (PRILOSEC) 20 MG capsule Take 1 capsule (20 mg total) by mouth daily. (Needs to be seen before next refill)  . [DISCONTINUED] tamsulosin (FLOMAX) 0.4 MG CAPS capsule Take 1 capsule (0.4 mg total) by mouth daily. (Needs to be seen before next refill)  . [DISCONTINUED] Vitamin D, Ergocalciferol, (DRISDOL) 1.25 MG (50000 UT) CAPS capsule TAKE 1 CAPSULE EVERY WEEK  . fluticasone (FLONASE) 50 MCG/ACT nasal spray Place 2 sprays into both nostrils daily.   No facility-administered encounter medications on file as of 05/24/2019.    No Known Allergies  Review of Systems      Objective:  BP 131/80   Pulse 99   Temp (!) 97.3 F (36.3 C)   Resp 20  Ht '5\' 9"'$  (1.753 m)   Wt 210 lb (95.3 kg)   SpO2 100%   BMI 31.01 kg/m    Wt Readings from Last 3 Encounters:  05/24/19 210 lb (95.3 kg)  06/11/18 210 lb (95.3 kg)  06/05/18 206 lb (93.4 kg)    Physical Exam  Results for orders placed or performed in visit on 02/09/19  HM DIABETES EYE EXAM  Result Value Ref Range   HM Diabetic Eye Exam No Retinopathy No Retinopathy       Pertinent labs & imaging results that were available during my care of the patient were reviewed by me and considered in my medical decision making.  Assessment & Plan:  Amando was seen today for medical management of chronic issues, diabetes, hyperlipidemia and hypertension.  Diagnoses and all orders for this visit:  Type 2 diabetes mellitus with diabetic neuropathy, without long-term current use of insulin (HCC) -     CBC with Differential/Platelet -     Bayer DCA Hb A1c Waived  Essential hypertension -     CBC with Differential/Platelet -     CMP14+EGFR -     losartan-hydrochlorothiazide (HYZAAR) 100-12.5 MG tablet; Take 1 tablet by mouth daily.  Gastroesophageal reflux disease without esophagitis -     CBC with Differential/Platelet -     omeprazole (PRILOSEC) 20 MG  capsule; Take 1 capsule (20 mg total) by mouth daily. (Needs to be seen before next refill)  Mixed hyperlipidemia -     CBC with Differential/Platelet -     Lipid panel -     atorvastatin (LIPITOR) 40 MG tablet; TAKE ONE (1) TABLET EACH DAY -     fenofibrate 160 MG tablet; TAKE ONE (1) TABLET EACH DAY  Benign prostatic hyperplasia, unspecified whether lower urinary tract symptoms present -     finasteride (PROSCAR) 5 MG tablet; Take 1 tablet (5 mg total) by mouth daily. -     tamsulosin (FLOMAX) 0.4 MG CAPS capsule; Take 1 capsule (0.4 mg total) by mouth daily. (Needs to be seen before next refill)  Diabetes mellitus without complication (HCC) -     metFORMIN (GLUCOPHAGE-XR) 500 MG 24 hr tablet; (Needs to be seen before next refill)TAKE 1 TABLET EVERY MORNING WITH BREAKFAST  Vitamin D deficiency -     Vitamin D, Ergocalciferol, (DRISDOL) 1.25 MG (50000 UNIT) CAPS capsule; Take 1 capsule (50,000 Units total) by mouth every 7 (seven) days.  Chronic rhinitis -     loratadine (CLARITIN) 10 MG tablet; Take 1 tablet (10 mg total) by mouth as needed for allergies. -     fluticasone (FLONASE) 50 MCG/ACT nasal spray; Place 2 sprays into both nostrils daily.     Continue all other maintenance medications.  Follow up plan: Return in about 3 months (around 08/22/2019), or if symptoms worsen or fail to improve.  Continue healthy lifestyle choices, including diet (rich in fruits, vegetables, and lean proteins, and low in salt and simple carbohydrates) and exercise (at least 30 minutes of moderate physical activity daily).  Educational handout given for DM  The above assessment and management plan was discussed with the patient. The patient verbalized understanding of and has agreed to the management plan. Patient is aware to call the clinic if they develop any new symptoms or if symptoms persist or worsen. Patient is aware when to return to the clinic for a follow-up visit. Patient educated on when  it is appropriate to go to the emergency department.   Monia Pouch,  FNP-C [place] [phone]

## 2019-05-24 NOTE — Progress Notes (Signed)
Subjective:  Patient ID: Roger Palmer, male    DOB: 04/20/1951, 69 y.o.   MRN: [id number]  Patient Care Team: Baruch Gouty, FNP as PCP - General (Family Medicine) Harlen Labs, MD as Referring Physician (Optometry) Myrlene Broker, MD as Attending Physician (Urology) Pyrtle, Lajuan Lines, MD as Consulting Physician (Gastroenterology)   Chief Complaint:  Medical Management of Chronic Issues (6 mo ), Diabetes, Hyperlipidemia, and Hypertension   HPI: Roger Palmer is a 69 y.o. male presenting on 05/24/2019 for Medical Management of Chronic Issues (6 mo ), Diabetes, Hyperlipidemia, and Hypertension   1. Type 2 diabetes mellitus with diabetic neuropathy, without long-term current use of insulin (HCC) Pt presents for follow up evaluation of Type 2 diabetes mellitus.  Current symptoms include neuropathy. Patient denies foot ulcerations, hyperglycemia, hypoglycemia , increased appetite, nausea, polydipsia, polyuria, visual disturbances, vomiting and weight loss.  Current diabetic medications include metformin Compliant with meds - Yes  Current monitoring regimen: home blood tests - 1-2 times daily Home blood sugar records: average 120 Any episodes of hypoglycemia? no  Known diabetic complications: peripheral neuropathy and cardiovascular disease Cardiovascular risk factors: advanced age (older than 27 for men, 48 for women), diabetes mellitus, dyslipidemia, hypertension, male gender, obesity (BMI >= 30 kg/m2) and sedentary lifestyle Eye exam current (within one year): no Podiatry yearly?  No Weight trend: stable Current diet: in general, a "healthy" diet   Current exercise: walking  PNA Vaccine UTD?  Yes Hep B Vaccine?  Yes Tdap Vaccine UTD?  Yes Urine microalbumin UTD? Yes  Is He on ACE inhibitor or angiotensin II receptor blocker?  Yes, losartan Is He on statin? Yes atorvastatin  Is He on ASA 81 mg daily?  No   2. Hypertension associated with type 2 diabetes  mellitus ([place]) Complaint with meds - Yes Current Medications - Hyzaar  Checking BP at home ranging 135/80 Exercising Regularly - Yes Watching Salt intake - Yes Pertinent ROS:  Headache - No Fatigue - No Visual Disturbances - No Chest pain - No Dyspnea - No Palpitations - No LE edema - No They report good compliance with medications and can restate their regimen by memory. No medication side effects.  Family, social, and smoking history reviewed.   BP Readings from Last 3 Encounters:  05/24/19 131/80  06/11/18 121/69  06/05/18 119/67   CMP Latest Ref Rng & Units 10/28/2018 06/05/2018 02/04/2018  Glucose 65 - 99 mg/dL 97 124(H) 129(H)  BUN 8 - 27 mg/dL '20 15 14  '$ Creatinine 0.76 - 1.27 mg/dL 1.12 1.16 1.17  Sodium 134 - 144 mmol/L 144 142 144  Potassium 3.5 - 5.2 mmol/L 3.9 4.2 4.3  Chloride 96 - 106 mmol/L 108(H) 105 106  CO2 20 - 29 mmol/L '24 22 25  '$ Calcium 8.6 - 10.2 mg/dL 9.6 9.6 9.5  Total Protein 6.0 - 8.5 g/dL 6.3 6.5 6.5  Total Bilirubin 0.0 - 1.2 mg/dL 0.2 0.6 0.6  Alkaline Phos 39 - 117 IU/L 51 51 49  AST 0 - 40 IU/L '21 30 22  '$ ALT 0 - 44 IU/L '19 25 20      '$ 3. Gastroesophageal reflux disease without esophagitis Compliant with medications - Yes Current medications - omeprazole Adverse side effects - No Cough - No Sore throat - No Voice change - No Hemoptysis - No Dysphagia or dyspepsia - No Water brash - No Red Flags (weight loss, hematochezia, melena, weight loss, early satiety, fevers, odynophagia, or persistent  vomiting) - No   4. Hyperlipidemia associated with type 2 diabetes mellitus ([place]) Compliant with medications - Yes Current medications - Lipitor, fenofibrate  Side effects from medications - No Diet - generally healthy Exercise - walking  Lab Results  Component Value Date   CHOL 119 10/28/2018   HDL 48 10/28/2018   LDLCALC 57 10/28/2018   TRIG 69 10/28/2018   CHOLHDL 2.5 10/28/2018     Family and personal medical history reviewed.  Smoking and ETOH history reviewed.    5. Benign prostatic hyperplasia Doing well on current medications. No nocturia, frequency, urgency, or hesitancy.   6. Vitamin D deficiency Pt is taking oral repletion therapy. Denies bone pain and tenderness, muscle weakness, fracture, and difficulty walking. Lab Results  Component Value Date   VD25OH 52.8 10/28/2018   VD25OH 56.9 06/05/2018   VD25OH 50.2 09/24/2017   Lab Results  Component Value Date   CALCIUM 9.6 10/28/2018   PHOS 3.6 08/06/2012      7. Chronic rhinitis Pt reports ongoing nasal congestion and rhinorrhea. States worse with season changes. No cough, shortness of breath, fever, chills, or headaches.   Relevant past medical, surgical, family, and social history reviewed and updated as indicated.  Allergies and medications reviewed and updated. Date reviewed: Chart in Epic.   Past Medical History:  Diagnosis Date  . Allergy    seasonal   . Back pain   . Bee sting allergy   . BPH (benign prostatic hypertrophy)   . Diabetes mellitus without complication ([place])   . Gastritis   . GERD (gastroesophageal reflux disease)   . Herniated disc    c7-c7, c5-6, c3-4, c4-5  . Hypercholesteremia   . Hypertension   . Nephrolithiasis   . Obesity   . PONV (postoperative nausea and vomiting)   . Reflux   . Vitamin D deficiency     Past Surgical History:  Procedure Laterality Date  . FRACTURE SURGERY     right hip   . INGUINAL HERNIA REPAIR     right  . kidney stones     x2 Dr. Rosana Hoes   . OPEN REDUCTION INTERNAL FIXATION (ORIF) DISTAL RADIAL FRACTURE Right 07/11/2012   Procedure: OPEN REDUCTION INTERNAL FIXATION (ORIF) DISTAL RADIAL FRACTURE;  Surgeon: Schuyler Amor, MD;  Location: [facility];  Service: Orthopedics;  Laterality: Right;  . ORIF ACETABULAR FRACTURE Right 07/14/2012   Procedure: OPEN REDUCTION INTERNAL FIXATION (ORIF) ACETABULAR FRACTURE;  Surgeon: Rozanna Box, MD;  Location: [facility];  Service: Orthopedics;   Laterality: Right;  . ORIF R elbow  07/12/2012  . ORIF R wirst  07/12/2012  . ORIF RADIAL FRACTURE Right 07/11/2012   Procedure: OPEN REDUCTION INTERNAL FIXATION (ORIF) RADIAL head FRACTURE;  Surgeon: Schuyler Amor, MD;  Location: [facility];  Service: Orthopedics;  Laterality: Right;    Social History   Socioeconomic History  . Marital status: Married    Spouse name: Kenney Houseman  . Number of children: 2  . Years of education: Not on file  . Highest education level: Not on file  Occupational History  . Occupation: retired    Comment: [employer]  Tobacco Use  . Smoking status: Never Smoker  . Smokeless tobacco: Never Used  Substance and Sexual Activity  . Alcohol use: Yes    Comment: social  . Drug use: No  . Sexual activity: Yes  Other Topics Concern  . Not on file  Social History Narrative   Worked for [employer].  Two  grandchildren.    Social Determinants of Health   Financial Resource Strain:   . Difficulty of Paying Living Expenses: Not on file  Food Insecurity:   . Worried About Charity fundraiser in the Last Year: Not on file  . Ran Out of Food in the Last Year: Not on file  Transportation Needs:   . Lack of Transportation (Medical): Not on file  . Lack of Transportation (Non-Medical): Not on file  Physical Activity:   . Days of Exercise per Week: Not on file  . Minutes of Exercise per Session: Not on file  Stress:   . Feeling of Stress : Not on file  Social Connections:   . Frequency of Communication with Friends and Family: Not on file  . Frequency of Social Gatherings with Friends and Family: Not on file  . Attends Religious Services: Not on file  . Active Member of Clubs or Organizations: Not on file  . Attends Archivist Meetings: Not on file  . Marital Status: Not on file  Intimate Partner Violence:   . Fear of Current or Ex-Partner: Not on file  . Emotionally Abused: Not on file  . Physically Abused: Not on file  . Sexually Abused: Not on  file    Outpatient Encounter Medications as of 05/24/2019  Medication Sig  . atorvastatin (LIPITOR) 40 MG tablet TAKE ONE (1) TABLET EACH DAY  . fenofibrate 160 MG tablet TAKE ONE (1) TABLET EACH DAY  . finasteride (PROSCAR) 5 MG tablet Take 1 tablet (5 mg total) by mouth daily.  Marland Kitchen loratadine (CLARITIN) 10 MG tablet Take 1 tablet (10 mg total) by mouth as needed for allergies.  Marland Kitchen losartan-hydrochlorothiazide (HYZAAR) 100-12.5 MG tablet Take 1 tablet by mouth daily.  . metFORMIN (GLUCOPHAGE-XR) 500 MG 24 hr tablet (Needs to be seen before next refill)TAKE 1 TABLET EVERY MORNING WITH BREAKFAST  . omeprazole (PRILOSEC) 20 MG capsule Take 1 capsule (20 mg total) by mouth daily. (Needs to be seen before next refill)  . tamsulosin (FLOMAX) 0.4 MG CAPS capsule Take 1 capsule (0.4 mg total) by mouth daily. (Needs to be seen before next refill)  . Vitamin D, Ergocalciferol, (DRISDOL) 1.25 MG (50000 UNIT) CAPS capsule Take 1 capsule (50,000 Units total) by mouth every 7 (seven) days.  . [DISCONTINUED] atorvastatin (LIPITOR) 40 MG tablet TAKE ONE (1) TABLET EACH DAY  . [DISCONTINUED] fenofibrate 160 MG tablet TAKE ONE (1) TABLET EACH DAY  . [DISCONTINUED] finasteride (PROSCAR) 5 MG tablet TAKE ONE (1) TABLET EACH DAY  . [DISCONTINUED] loratadine (CLARITIN) 10 MG tablet Take 10 mg by mouth as needed for allergies.  . [DISCONTINUED] losartan-hydrochlorothiazide (HYZAAR) 100-12.5 MG tablet TAKE ONE (1) TABLET EACH DAY  . [DISCONTINUED] metFORMIN (GLUCOPHAGE-XR) 500 MG 24 hr tablet (Needs to be seen before next refill)TAKE 1 [place]  . [DISCONTINUED] omeprazole (PRILOSEC) 20 MG capsule Take 1 capsule (20 mg total) by mouth daily. (Needs to be seen before next refill)  . [DISCONTINUED] tamsulosin (FLOMAX) 0.4 MG CAPS capsule Take 1 capsule (0.4 mg total) by mouth daily. (Needs to be seen before next refill)  . [DISCONTINUED] Vitamin D, Ergocalciferol, (DRISDOL) 1.25 MG (50000 UT)  CAPS capsule TAKE 1 CAPSULE EVERY WEEK  . fluticasone (FLONASE) 50 MCG/ACT nasal spray Place 2 sprays into both nostrils daily.   No facility-administered encounter medications on file as of 05/24/2019.    No Known Allergies  Review of Systems  Constitutional: Negative for activity change, appetite  change, chills, diaphoresis, fatigue, fever and unexpected weight change.  HENT: Positive for congestion, hearing loss, postnasal drip, rhinorrhea and tinnitus. Negative for sinus pressure and sinus pain.   Eyes: Negative.  Negative for visual disturbance.  Respiratory: Negative for cough, chest tightness and shortness of breath.   Cardiovascular: Negative for chest pain, palpitations and leg swelling.  Gastrointestinal: Negative for abdominal pain, blood in stool, constipation, diarrhea, nausea and vomiting.  Endocrine: Negative.   Genitourinary: Negative for decreased urine volume, difficulty urinating, dysuria, frequency and urgency.  Musculoskeletal: Negative for arthralgias and myalgias.  Skin: Negative.   Allergic/Immunologic: Negative.   Neurological: Negative for dizziness, tremors, seizures, syncope, facial asymmetry, speech difficulty, weakness, light-headedness, numbness and headaches.  Hematological: Negative.   Psychiatric/Behavioral: Negative for confusion, hallucinations, sleep disturbance and suicidal ideas.  All other systems reviewed and are negative.       Objective:  BP 131/80   Pulse 99   Temp (!) 97.3 F (36.3 C)   Resp 20   Ht '5\' 9"'$  (1.753 m)   Wt 210 lb (95.3 kg)   SpO2 100%   BMI 31.01 kg/m    Wt Readings from Last 3 Encounters:  05/24/19 210 lb (95.3 kg)  06/11/18 210 lb (95.3 kg)  06/05/18 206 lb (93.4 kg)    Physical Exam Vitals and nursing note reviewed.  Constitutional:      General: He is not in acute distress.    Appearance: Normal appearance. He is well-developed and well-groomed. He is not ill-appearing, toxic-appearing or diaphoretic.    HENT:     Head: Normocephalic and atraumatic.     Jaw: There is normal jaw occlusion.     Right Ear: Hearing, tympanic membrane, ear canal and external ear normal.     Left Ear: Tympanic membrane, ear canal and external ear normal. Decreased hearing noted.     Ears:     Weber exam findings: lateralizes right.    Right Rinne: AC > BC.    Left Rinne: BC > AC.    Nose: Nose normal.     Mouth/Throat:     Lips: Pink.     Mouth: Mucous membranes are moist.     Pharynx: Oropharynx is clear. Uvula midline.  Eyes:     General: Lids are normal.     Extraocular Movements: Extraocular movements intact.     Conjunctiva/sclera: Conjunctivae normal.     Pupils: Pupils are equal, round, and reactive to light.  Neck:     Thyroid: No thyroid mass, thyromegaly or thyroid tenderness.     Vascular: No carotid bruit or JVD.     Trachea: Trachea and phonation normal.  Cardiovascular:     Rate and Rhythm: Normal rate and regular rhythm.     Chest Wall: PMI is not displaced.     Pulses: Normal pulses.     Heart sounds: Normal heart sounds. No murmur. No friction rub. No gallop.   Pulmonary:     Effort: Pulmonary effort is normal. No respiratory distress.     Breath sounds: Normal breath sounds. No wheezing.  Abdominal:     General: Bowel sounds are normal. There is no distension or abdominal bruit.     Palpations: Abdomen is soft. There is no hepatomegaly or splenomegaly.     Tenderness: There is no abdominal tenderness. There is no right CVA tenderness or left CVA tenderness.     Hernia: No hernia is present.  Musculoskeletal:        General: Normal  range of motion.     Cervical back: Normal range of motion and neck supple.     Right lower leg: No edema.     Left lower leg: No edema.  Lymphadenopathy:     Cervical: No cervical adenopathy.  Skin:    General: Skin is warm and dry.     Capillary Refill: Capillary refill takes less than 2 seconds.     Coloration: Skin is not cyanotic, jaundiced  or pale.     Findings: No rash.  Neurological:     General: No focal deficit present.     Mental Status: He is alert and oriented to person, place, and time.     Cranial Nerves: Cranial nerves are intact. No cranial nerve deficit.     Sensory: Sensation is intact. No sensory deficit.     Motor: Motor function is intact. No weakness.     Coordination: Coordination is intact. Coordination normal.     Gait: Gait is intact. Gait normal.     Deep Tendon Reflexes: Reflexes are normal and symmetric. Reflexes normal.  Psychiatric:        Attention and Perception: Attention and perception normal.        Mood and Affect: Mood and affect normal.        Speech: Speech normal.        Behavior: Behavior normal. Behavior is cooperative.        Thought Content: Thought content normal.        Cognition and Memory: Cognition and memory normal.        Judgment: Judgment normal.     Results for orders placed or performed in visit on 05/24/19  Bayer DCA Hb A1c Waived  Result Value Ref Range   HB A1C (BAYER DCA - WAIVED) 7.0 (H) <7.0 %       Pertinent labs & imaging results that were available during my care of the patient were reviewed by me and considered in my medical decision making.  Assessment & Plan:  Burlin was seen today for medical management of chronic issues, diabetes, hyperlipidemia and hypertension.  Diagnoses and all orders for this visit:  Type 2 diabetes mellitus with diabetic neuropathy, without long-term current use of insulin (HCC) A1C 7.0, no changes in medications today. Diet and exercise encouraged. Continue medications as prescribed. Follow up in 3 months.  -     metFORMIN (GLUCOPHAGE-XR) 500 MG 24 hr tablet; TAKE 1 TABLET EVERY MORNING WITH BREAKFAST -     CBC with Differential/Platelet -     Bayer DCA Hb A1c Waived -     Pneumococcal polysaccharide vaccine 23-valent greater than or equal to 2yo subcutaneous/IM  Hypertension associated with type 2 diabetes mellitus  (HCC) BP well controlled. Changes were not made in regimen today. Goal BP is 130/80. Pt aware to report any persistent high or low readings. DASH diet and exercise encouraged. Exercise at least 150 minutes per week and increase as tolerated. Goal BMI > 25. Stress management encouraged. Avoid nicotine and tobacco product use. Avoid excessive alcohol and NSAID's. Avoid more than 2000 mg of sodium daily. Medications as prescribed. Follow up as scheduled.  -     CBC with Differential/Platelet -     CMP14+EGFR -     losartan-hydrochlorothiazide (HYZAAR) 100-12.5 MG tablet; Take 1 tablet by mouth daily.  Gastroesophageal reflux disease without esophagitis No red flags present. Diet discussed. Avoid fried, spicy, fatty, greasy, and acidic foods. Avoid caffeine, nicotine, and alcohol. Do not eat  2-3 hours before bedtime and stay upright for at least 1-2 hours after eating. Eat small frequent meals. Avoid NSAID's like motrin and aleve. Medications as prescribed. Report any new or worsening symptoms. Follow up as discussed or sooner if needed.   -     CBC with Differential/Platelet -     omeprazole (PRILOSEC) 20 MG capsule; Take 1 capsule (20 mg total) by mouth daily. (Needs to be seen before next refill)  Hyperlipidemia associated with type 2 diabetes mellitus ([place]) Diet encouraged - increase intake of fresh fruits and vegetables, increase intake of lean proteins. Bake, broil, or grill foods. Avoid fried, greasy, and fatty foods. Avoid fast foods. Increase intake of fiber-rich whole grains. Exercise encouraged - at least 150 minutes per week and advance as tolerated.  Goal BMI < 25. Continue medications as prescribed. Follow up in 3-6 months as discussed.  -     CBC with Differential/Platelet -     Lipid panel -     atorvastatin (LIPITOR) 40 MG tablet; TAKE ONE (1) TABLET EACH DAY -     fenofibrate 160 MG tablet; TAKE ONE (1) TABLET EACH DAY  Benign prostatic hyperplasia, unspecified whether lower urinary  tract symptoms present Symptoms well controlled with below, will continue.  -     finasteride (PROSCAR) 5 MG tablet; Take 1 tablet (5 mg total) by mouth daily. -     tamsulosin (FLOMAX) 0.4 MG CAPS capsule; Take 1 capsule (0.4 mg total) by mouth daily. (Needs to be seen before next refill)  Vitamin D deficiency Labs pending. Continue repletion therapy. If indicated, will change repletion dosage. Eat foods rich in Vit D including milk, orange juice, yogurt with vitamin D added, salmon or mackerel, canned tuna fish, cereals with vitamin D added, and cod liver oil. Get out in the sun but make sure to wear at least SPF 30 sunscreen.  -     Vitamin D, Ergocalciferol, (DRISDOL) 1.25 MG (50000 UNIT) CAPS capsule; Take 1 capsule (50,000 Units total) by mouth every 7 (seven) days.  Chronic rhinitis Claritin and Flonase daily as prescribed. Report any new or worsening symptoms.  -     loratadine (CLARITIN) 10 MG tablet; Take 1 tablet (10 mg total) by mouth as needed for allergies. -     fluticasone (FLONASE) 50 MCG/ACT nasal spray; Place 2 sprays into both nostrils daily.  Need for pneumococcal vaccination -     Pneumococcal polysaccharide vaccine 23-valent greater than or equal to 2yo subcutaneous/IM  Need for immunization against influenza -     Flu Vaccine QUAD High Dose(Fluad)     Continue all other maintenance medications.  Follow up plan: Return in about 3 months (around 08/22/2019), or if symptoms worsen or fail to improve.  Continue healthy lifestyle choices, including diet (rich in fruits, vegetables, and lean proteins, and low in salt and simple carbohydrates) and exercise (at least 30 minutes of moderate physical activity daily).  Educational handout given for DM  The above assessment and management plan was discussed with the patient. The patient verbalized understanding of and has agreed to the management plan. Patient is aware to call the clinic if they develop any new symptoms or  if symptoms persist or worsen. Patient is aware when to return to the clinic for a follow-up visit. Patient educated on when it is appropriate to go to the emergency department.   Monia Pouch, FNP-C [facility] [phone]

## 2019-05-24 NOTE — Patient Instructions (Addendum)

## 2019-05-25 DIAGNOSIS — H833X3 Noise effects on inner ear, bilateral: Secondary | ICD-10-CM | POA: Diagnosis not present

## 2019-05-25 DIAGNOSIS — H903 Sensorineural hearing loss, bilateral: Secondary | ICD-10-CM | POA: Diagnosis not present

## 2019-05-25 DIAGNOSIS — H9313 Tinnitus, bilateral: Secondary | ICD-10-CM | POA: Diagnosis not present

## 2019-05-25 DIAGNOSIS — H9113 Presbycusis, bilateral: Secondary | ICD-10-CM | POA: Diagnosis not present

## 2019-05-27 ENCOUNTER — Encounter: Payer: Self-pay | Admitting: Family Medicine

## 2019-05-27 ENCOUNTER — Other Ambulatory Visit: Payer: Self-pay | Admitting: Family Medicine

## 2019-05-27 DIAGNOSIS — E1140 Type 2 diabetes mellitus with diabetic neuropathy, unspecified: Secondary | ICD-10-CM

## 2019-06-24 ENCOUNTER — Other Ambulatory Visit: Payer: Self-pay

## 2019-06-24 ENCOUNTER — Ambulatory Visit (INDEPENDENT_AMBULATORY_CARE_PROVIDER_SITE_OTHER): Payer: Medicare Other | Admitting: Family

## 2019-06-24 ENCOUNTER — Encounter: Payer: Self-pay | Admitting: Family

## 2019-06-24 VITALS — BP 147/83 | HR 98 | Temp 96.4°F | Ht 69.0 in | Wt 213.6 lb

## 2019-06-24 DIAGNOSIS — R319 Hematuria, unspecified: Secondary | ICD-10-CM

## 2019-06-24 DIAGNOSIS — R109 Unspecified abdominal pain: Secondary | ICD-10-CM | POA: Diagnosis not present

## 2019-06-24 DIAGNOSIS — N200 Calculus of kidney: Secondary | ICD-10-CM

## 2019-06-24 DIAGNOSIS — E1140 Type 2 diabetes mellitus with diabetic neuropathy, unspecified: Secondary | ICD-10-CM | POA: Diagnosis not present

## 2019-06-24 LAB — URINALYSIS, COMPLETE
Bilirubin, UA: POSITIVE — AB
Glucose, UA: NEGATIVE
Ketones, UA: NEGATIVE
Nitrite, UA: NEGATIVE
Specific Gravity, UA: 1.025 (ref 1.005–1.030)
Urobilinogen, Ur: 2 mg/dL — ABNORMAL HIGH (ref 0.2–1.0)
pH, UA: 6.5 (ref 5.0–7.5)

## 2019-06-24 LAB — MICROSCOPIC EXAMINATION
Bacteria, UA: NONE SEEN
Epithelial Cells (non renal): NONE SEEN /HPF (ref 0–10)
Renal Epithel, UA: NONE SEEN /HPF

## 2019-06-24 MED ORDER — HYDROCODONE-ACETAMINOPHEN 7.5-325 MG PO TABS: 1.0000 | ORAL_TABLET | Freq: Four times a day (QID) | ORAL | 0 refills | Status: DC | PRN

## 2019-06-24 MED ORDER — ONDANSETRON HCL 4 MG PO TABS: 4.0000 mg | ORAL_TABLET | Freq: Three times a day (TID) | ORAL | 0 refills | Status: DC | PRN

## 2019-06-24 NOTE — Progress Notes (Signed)
Subjective:    Patient ID: Roger Palmer, male    DOB: 08/28/1950, 69 y.o.   MRN: [id number]  Chief Complaint  Patient presents with  . Nephrolithiasis   Pt presents to the office today with flank pain that started this morning. He has hx of kidney stones. He takes flomax daily.  Flank Pain This is a new problem. The current episode started today. The problem occurs constantly. The problem has been waxing and waning since onset. The pain is present in the lumbar spine. The quality of the pain is described as aching, shooting, stabbing and cramping. Radiates to: abdominal pain. The pain is at a severity of 8/10 (8 when he had abdominal pain, 2 now). The pain is mild.      Review of Systems  Genitourinary: Positive for flank pain.  All other systems reviewed and are negative.      Objective:   Physical Exam Vitals reviewed.  Constitutional:      General: He is not in acute distress.    Appearance: He is well-developed.  HENT:     Head: Normocephalic.  Eyes:     General:        Right eye: No discharge.        Left eye: No discharge.     Pupils: Pupils are equal, round, and reactive to light.  Neck:     Thyroid: No thyromegaly.  Cardiovascular:     Rate and Rhythm: Normal rate and regular rhythm.     Heart sounds: Normal heart sounds. No murmur.  Pulmonary:     Effort: Pulmonary effort is normal. No respiratory distress.     Breath sounds: Normal breath sounds. No wheezing.  Abdominal:     General: Bowel sounds are normal. There is no distension.     Palpations: Abdomen is soft.     Tenderness: There is no abdominal tenderness.  Musculoskeletal:        General: No tenderness. Normal range of motion.     Cervical back: Normal range of motion and neck supple.  Skin:    General: Skin is warm and dry.     Findings: No erythema or rash.  Neurological:     Mental Status: He is alert and oriented to person, place, and time.     Cranial Nerves: No cranial nerve deficit.       Deep Tendon Reflexes: Reflexes are normal and symmetric.  Psychiatric:        Behavior: Behavior normal.        Thought Content: Thought content normal.        Judgment: Judgment normal.     BP (!) 147/83   Pulse 98   Temp (!) 96.4 F (35.8 C) (Temporal)   Ht 5\' 9"  (1.753 m)   Wt 213 lb 9.6 oz (96.9 kg)   SpO2 99%   BMI 31.54 kg/m        Assessment & Plan:  BILAL KALLER comes in today with chief complaint of Nephrolithiasis   Diagnosis and orders addressed:  1. Flank pain - Urinalysis, Complete - ondansetron (ZOFRAN) 4 MG tablet; Take 1 tablet (4 mg total) by mouth every 8 (eight) hours as needed for nausea or vomiting.  Dispense: 20 tablet; Refill: 0 - HYDROcodone-acetaminophen (NORCO) 7.5-325 MG tablet; Take 1 tablet by mouth every 6 (six) hours as needed for moderate pain.  Dispense: 15 tablet; Refill: 0  2. Kidney stone - ondansetron (ZOFRAN) 4 MG tablet; Take 1 tablet (4  mg total) by mouth every 8 (eight) hours as needed for nausea or vomiting.  Dispense: 20 tablet; Refill: 0 - HYDROcodone-acetaminophen (NORCO) 7.5-325 MG tablet; Take 1 tablet by mouth every 6 (six) hours as needed for moderate pain.  Dispense: 15 tablet; Refill: 0  3. Hematuria, unspecified type - ondansetron (ZOFRAN) 4 MG tablet; Take 1 tablet (4 mg total) by mouth every 8 (eight) hours as needed for nausea or vomiting.  Dispense: 20 tablet; Refill: 0 - HYDROcodone-acetaminophen (NORCO) 7.5-325 MG tablet; Take 1 tablet by mouth every 6 (six) hours as needed for moderate pain.  Dispense: 15 tablet; Refill: 0   Force fluids Continue Flomax Strainer given PT reviewed in [place] controlled database- No red flags noted. Will given a few days of controlled medications. No refills and only take as needed.  Call if pain worsens or changes in urine output  Evelina Dun, FNP

## 2019-06-24 NOTE — Patient Instructions (Signed)
Kidney Stones  Kidney stones are solid, rock-like deposits that form inside of the kidneys. The kidneys are a pair of organs that make urine. A kidney stone may form in a kidney and move into other parts of the urinary tract, including the tubes that connect the kidneys to the bladder (ureters), the bladder, and the tube that carries urine out of the body (urethra). As the stone moves through these areas, it can cause intense pain and block the flow of urine. Kidney stones are created when high levels of certain minerals are found in the urine. The stones are usually passed out of the body through urination, but in some cases, medical treatment may be needed to remove them. What are the causes? Kidney stones may be caused by:  A condition in which certain glands produce too much parathyroid hormone (primary hyperparathyroidism), which causes too much calcium buildup in the blood.  A buildup of uric acid crystals in the bladder (hyperuricosuria). Uric acid is a chemical that the body produces when you eat certain foods. It usually exits the body in the urine.  Narrowing (stricture) of one or both of the ureters.  A kidney blockage that is present at birth (congenital obstruction).  Past surgery on the kidney or the ureters, such as gastric bypass surgery. What increases the risk? The following factors may make you more likely to develop this condition:  Having had a kidney stone in the past.  Having a family history of kidney stones.  Not drinking enough water.  Eating a diet that is high in protein, salt (sodium), or sugar.  Being overweight or obese. What are the signs or symptoms? Symptoms of a kidney stone may include:  Pain in the side of the abdomen, right below the ribs (flank pain). Pain usually spreads (radiates) to the groin.  Needing to urinate frequently or urgently.  Painful urination.  Blood in the urine (hematuria).  Nausea.  Vomiting.  Fever and chills. How  is this diagnosed? This condition may be diagnosed based on:  Your symptoms and medical history.  A physical exam.  Blood tests.  Urine tests. These may be done before and after the stone passes out of your body through urination.  Imaging tests, such as a CT scan, abdominal X-ray, or ultrasound.  A procedure to examine the inside of the bladder (cystoscopy). How is this treated? Treatment for kidney stones depends on the size, location, and makeup of the stones. Kidney stones will often pass out of the body through urination. You may need to:  Increase your fluid intake to help pass the stone. In some cases, you may be given fluids through an IV and may need to be monitored at the hospital.  Take medicine for pain.  Make changes in your diet to help prevent kidney stones from coming back. Sometimes, medical procedures are needed to remove a kidney stone. This may involve:  A procedure to break up kidney stones using: ? A focused beam of light (laser therapy). ? Shock waves (extracorporeal shock wave lithotripsy).  Surgery to remove kidney stones. This may be needed if you have severe pain or have stones that block your urinary tract. Follow these instructions at home: Medicines  Take over-the-counter and prescription medicines only as told by your health care provider.  Ask your health care provider if the medicine prescribed to you requires you to avoid driving or using heavy machinery. Eating and drinking  Drink enough fluid to keep your urine pale yellow.   You may be instructed to drink at least 8-10 glasses of water each day. This will help you pass the kidney stone.  If directed, change your diet. This may include: ? Limiting how much sodium you eat. ? Eating more fruits and vegetables. ? Limiting how much animal protein--such as red meat, poultry, fish, and eggs--you eat.  Follow instructions from your health care provider about eating or drinking  restrictions. General instructions  Collect urine samples as told by your health care provider. You may need to collect a urine sample: ? 24 hours after you pass the stone. ? 8-12 weeks after passing the kidney stone, and every 6-12 months after that.  Strain your urine every time you urinate, for as long as directed. Use the strainer that your health care provider recommends.  Do not throw out the kidney stone after passing it. Keep the stone so it can be tested by your health care provider. Testing the makeup of your kidney stone may help prevent you from getting kidney stones in the future.  Keep all follow-up visits as told by your health care provider. This is important. You may need follow-up X-rays or ultrasounds to make sure that your stone has passed. How is this prevented? To prevent another kidney stone:  Drink enough fluid to keep your urine pale yellow. This is the best way to prevent kidney stones.  Eat a healthy diet and follow recommendations from your health care provider about foods to avoid. You may be instructed to eat a low-protein diet. Recommendations vary depending on the type of kidney stone that you have.  Maintain a healthy weight. Where to find more information  National Kidney Foundation (NKF): www.kidney.org  [facility] (UCF): www.urologyhealth.org Contact a health care provider if:  You have pain that gets worse or does not get better with medicine. Get help right away if:  You have a fever or chills.  You develop severe pain.  You develop new abdominal pain.  You faint.  You are unable to urinate. Summary  Kidney stones are solid, rock-like deposits that form inside of the kidneys.  Kidney stones can cause nausea, vomiting, blood in the urine, abdominal pain, and the urge to urinate frequently.  Treatment for kidney stones depends on the size, location, and makeup of the stones. Kidney stones will often pass out of the body  through urination.  Kidney stones can be prevented by drinking enough fluids, eating a healthy diet, and maintaining a healthy weight. This information is not intended to replace advice given to you by your health care provider. Make sure you discuss any questions you have with your health care provider. Document Revised: 09/15/2018 Document Reviewed: 09/15/2018 Elsevier Patient Education  2020 Elsevier Inc.  

## 2019-06-25 DIAGNOSIS — H903 Sensorineural hearing loss, bilateral: Secondary | ICD-10-CM | POA: Diagnosis not present

## 2019-07-19 ENCOUNTER — Encounter: Payer: Self-pay | Admitting: Family Medicine

## 2019-07-22 ENCOUNTER — Encounter: Payer: Self-pay | Admitting: *Deleted

## 2019-08-20 ENCOUNTER — Ambulatory Visit (INDEPENDENT_AMBULATORY_CARE_PROVIDER_SITE_OTHER): Payer: Medicare Other | Admitting: Family Medicine

## 2019-08-20 ENCOUNTER — Other Ambulatory Visit: Payer: Self-pay

## 2019-08-20 ENCOUNTER — Encounter: Payer: Self-pay | Admitting: Family Medicine

## 2019-08-20 VITALS — BP 119/66 | HR 93 | Temp 98.2°F | Ht 69.0 in | Wt 206.6 lb

## 2019-08-20 DIAGNOSIS — N400 Enlarged prostate without lower urinary tract symptoms: Secondary | ICD-10-CM

## 2019-08-20 DIAGNOSIS — J310 Chronic rhinitis: Secondary | ICD-10-CM

## 2019-08-20 DIAGNOSIS — E559 Vitamin D deficiency, unspecified: Secondary | ICD-10-CM

## 2019-08-20 DIAGNOSIS — E1142 Type 2 diabetes mellitus with diabetic polyneuropathy: Secondary | ICD-10-CM | POA: Diagnosis not present

## 2019-08-20 DIAGNOSIS — I152 Hypertension secondary to endocrine disorders: Secondary | ICD-10-CM

## 2019-08-20 DIAGNOSIS — E1169 Type 2 diabetes mellitus with other specified complication: Secondary | ICD-10-CM

## 2019-08-20 DIAGNOSIS — K219 Gastro-esophageal reflux disease without esophagitis: Secondary | ICD-10-CM

## 2019-08-20 DIAGNOSIS — E1140 Type 2 diabetes mellitus with diabetic neuropathy, unspecified: Secondary | ICD-10-CM

## 2019-08-20 DIAGNOSIS — I10 Essential (primary) hypertension: Secondary | ICD-10-CM

## 2019-08-20 DIAGNOSIS — E1159 Type 2 diabetes mellitus with other circulatory complications: Secondary | ICD-10-CM

## 2019-08-20 DIAGNOSIS — E785 Hyperlipidemia, unspecified: Secondary | ICD-10-CM

## 2019-08-20 MED ORDER — LOSARTAN POTASSIUM-HCTZ 100-12.5 MG PO TABS: 1.0000 | ORAL_TABLET | Freq: Every day | ORAL | 2 refills | Status: DC

## 2019-08-20 MED ORDER — FENOFIBRATE 160 MG PO TABS: ORAL_TABLET | ORAL | 2 refills | Status: DC

## 2019-08-20 MED ORDER — METFORMIN HCL ER 500 MG PO TB24: ORAL_TABLET | ORAL | 2 refills | Status: DC

## 2019-08-20 MED ORDER — OMEPRAZOLE 20 MG PO CPDR: 20.0000 mg | DELAYED_RELEASE_CAPSULE | Freq: Every day | ORAL | 2 refills | Status: DC

## 2019-08-20 MED ORDER — LORATADINE 10 MG PO TABS: 10.0000 mg | ORAL_TABLET | ORAL | 2 refills | Status: DC | PRN

## 2019-08-20 MED ORDER — ATORVASTATIN CALCIUM 40 MG PO TABS: ORAL_TABLET | ORAL | 2 refills | Status: DC

## 2019-08-20 MED ORDER — TAMSULOSIN HCL 0.4 MG PO CAPS: 0.4000 mg | ORAL_CAPSULE | Freq: Every day | ORAL | 2 refills | Status: DC

## 2019-08-20 MED ORDER — FINASTERIDE 5 MG PO TABS: 5.0000 mg | ORAL_TABLET | Freq: Every day | ORAL | 2 refills | Status: DC

## 2019-08-20 MED ORDER — VITAMIN D (ERGOCALCIFEROL) 1.25 MG (50000 UNIT) PO CAPS: 50000.0000 [IU] | ORAL_CAPSULE | ORAL | 3 refills | Status: AC

## 2019-08-20 NOTE — Patient Instructions (Signed)

## 2019-08-20 NOTE — Progress Notes (Signed)
Subjective:  Patient ID: Roger Palmer, male    DOB: [birth year], 69 y.o.   MRN: [id number]  Patient Care Team: Claretta Fraise, MD as PCP - General (Family Medicine) Harlen Labs, MD as Referring Physician (Optometry) Myrlene Broker, MD as Attending Physician (Urology) Pyrtle, Lajuan Lines, MD as Consulting Physician (Gastroenterology)   Chief Complaint:  Medical Management of Chronic Issues, Diabetes, Hyperlipidemia, and Hypertension   HPI: Roger Palmer is a 69 y.o. male presenting on 08/20/2019 for Medical Management of Chronic Issues, Diabetes, Hyperlipidemia, and Hypertension   1. Type 2 diabetes mellitus with diabetic neuropathy, without long-term current use of insulin (HCC) Home BS logs ranging from 120-160 range. States he has been eating more ice cream than usual over the last several weeks. He is active daily and does comply with medications. No polyuria, polydipsia, or polyphagia.   2. Hyperlipidemia associated with type 2 diabetes mellitus ([place]) Tries to limit fatty foods in diet. Does eat sweets more often than he should. Is compliant with his statin therapy without associated side effects.   3. Hypertension associated with diabetes ([place]) Compliant with medications without associated side effects. No chest pain, leg swelling, palpitations, headaches, weakness, or confusion.   4. Diabetic polyneuropathy associated with type 2 diabetes mellitus ([place]) No new or worsening symptoms. Does not require medications for neuropathy at this time.   5. Vitamin D deficiency On repletion therapy and doing well. No recent falls, fractures, or injures. No arthralgias or myalgias.   6. Benign prostatic hyperplasia without lower urinary tract symptoms Asymptomatic. Taking Proscar and Flomax daily and this controls symptoms well.   7. Chronic rhinitis Does well with daily Claritin and Flonase. No new or worsening symptoms.   8. Gastroesophageal reflux disease without  esophagitis Well controlled. No dysphagia, voice changes, dyspepsia, hemoptysis, melena, or hematochezia.      Relevant past medical, surgical, family, and social history reviewed and updated as indicated.  Allergies and medications reviewed and updated. Date reviewed: Chart in Epic.   Past Medical History:  Diagnosis Date  . Allergy    seasonal   . Back pain   . Bee sting allergy   . BPH (benign prostatic hypertrophy)   . Diabetes mellitus without complication ([place])   . Gastritis   . GERD (gastroesophageal reflux disease)   . Herniated disc    c7-c7, c5-6, c3-4, c4-5  . Hypercholesteremia   . Hypertension   . Nephrolithiasis   . Obesity   . PONV (postoperative nausea and vomiting)   . Reflux   . Vitamin D deficiency     Past Surgical History:  Procedure Laterality Date  . FRACTURE SURGERY     right hip   . INGUINAL HERNIA REPAIR     right  . kidney stones     x2 Dr. Rosana Hoes   . OPEN REDUCTION INTERNAL FIXATION (ORIF) DISTAL RADIAL FRACTURE Right 07/11/2012   Procedure: OPEN REDUCTION INTERNAL FIXATION (ORIF) DISTAL RADIAL FRACTURE;  Surgeon: Schuyler Amor, MD;  Location: [facility];  Service: Orthopedics;  Laterality: Right;  . ORIF ACETABULAR FRACTURE Right 07/14/2012   Procedure: OPEN REDUCTION INTERNAL FIXATION (ORIF) ACETABULAR FRACTURE;  Surgeon: Rozanna Box, MD;  Location: [facility];  Service: Orthopedics;  Laterality: Right;  . ORIF R elbow  07/12/2012  . ORIF R wirst  07/12/2012  . ORIF RADIAL FRACTURE Right 07/11/2012   Procedure: OPEN REDUCTION INTERNAL FIXATION (ORIF) RADIAL head FRACTURE;  Surgeon: Schuyler Amor,  MD;  Location: [facility];  Service: Orthopedics;  Laterality: Right;    Social History   Socioeconomic History  . Marital status: Married    Spouse name: Kenney Houseman  . Number of children: 2  . Years of education: Not on file  . Highest education level: Not on file  Occupational History  . Occupation: retired    Comment: [employer]  Tobacco Use  .  Smoking status: Never Smoker  . Smokeless tobacco: Never Used  Substance and Sexual Activity  . Alcohol use: Yes    Comment: social  . Drug use: No  . Sexual activity: Yes  Other Topics Concern  . 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    Social Determinants of Health   Financial Resource Strain:   . Difficulty of Paying Living Expenses:   Food Insecurity:   . Worried About Charity fundraiser in the Last Year:   . Arboriculturist in the Last Year:   Transportation Needs:   . Film/video editor (Medical):   Marland Kitchen Lack of Transportation (Non-Medical):   Physical Activity:   . Days of Exercise per Week:   . Minutes of Exercise per Session:   Stress:   . Feeling of Stress :   Social Connections:   . Frequency of Communication with Friends and Family:   . Frequency of Social Gatherings with Friends and Family:   . Attends Religious Services:   . Active Member of Clubs or Organizations:   . Attends Archivist Meetings:   Marland Kitchen Marital Status:   Intimate Partner Violence:   . Fear of Current or Ex-Partner:   . Emotionally Abused:   Marland Kitchen Physically Abused:   . Sexually Abused:     Outpatient Encounter Medications as of 08/20/2019  Medication Sig  . atorvastatin (LIPITOR) 40 MG tablet TAKE ONE (1) TABLET EACH DAY  . fenofibrate 160 MG tablet TAKE ONE (1) TABLET EACH DAY  . finasteride (PROSCAR) 5 MG tablet Take 1 tablet (5 mg total) by mouth daily.  . fluticasone (FLONASE) 50 MCG/ACT nasal spray Place 2 sprays into both nostrils daily.  Marland Kitchen loratadine (CLARITIN) 10 MG tablet Take 1 tablet (10 mg total) by mouth as needed for allergies.  Marland Kitchen losartan-hydrochlorothiazide (HYZAAR) 100-12.5 MG tablet Take 1 tablet by mouth daily.  . metFORMIN (GLUCOPHAGE-XR) 500 MG 24 hr tablet TAKE 1 TABLET EVERY MORNING WITH BREAKFAST  . omeprazole (PRILOSEC) 20 MG capsule Take 1 capsule (20 mg total) by mouth daily. (Needs to be seen before next  refill)  . tamsulosin (FLOMAX) 0.4 MG CAPS capsule Take 1 capsule (0.4 mg total) by mouth daily. (Needs to be seen before next refill)  . Vitamin D, Ergocalciferol, (DRISDOL) 1.25 MG (50000 UNIT) CAPS capsule Take 1 capsule (50,000 Units total) by mouth every 7 (seven) days.  . [DISCONTINUED] atorvastatin (LIPITOR) 40 MG tablet TAKE ONE (1) TABLET EACH DAY  . [DISCONTINUED] fenofibrate 160 MG tablet TAKE ONE (1) TABLET EACH DAY  . [DISCONTINUED] finasteride (PROSCAR) 5 MG tablet Take 1 tablet (5 mg total) by mouth daily.  . [DISCONTINUED] HYDROcodone-acetaminophen (NORCO) 7.5-325 MG tablet Take 1 tablet by mouth every 6 (six) hours as needed for moderate pain.  . [DISCONTINUED] loratadine (CLARITIN) 10 MG tablet Take 1 tablet (10 mg total) by mouth as needed for allergies.  . [DISCONTINUED] losartan-hydrochlorothiazide (HYZAAR) 100-12.5 MG tablet Take 1 tablet by mouth daily.  . [DISCONTINUED] metFORMIN (GLUCOPHAGE-XR)  500 MG 24 hr tablet TAKE 1 TABLET EVERY MORNING WITH BREAKFAST  . [DISCONTINUED] omeprazole (PRILOSEC) 20 MG capsule Take 1 capsule (20 mg total) by mouth daily. (Needs to be seen before next refill)  . [DISCONTINUED] ondansetron (ZOFRAN) 4 MG tablet Take 1 tablet (4 mg total) by mouth every 8 (eight) hours as needed for nausea or vomiting.  . [DISCONTINUED] tamsulosin (FLOMAX) 0.4 MG CAPS capsule Take 1 capsule (0.4 mg total) by mouth daily. (Needs to be seen before next refill)  . [DISCONTINUED] Vitamin D, Ergocalciferol, (DRISDOL) 1.25 MG (50000 UNIT) CAPS capsule Take 1 capsule (50,000 Units total) by mouth every 7 (seven) days.   No facility-administered encounter medications on file as of 08/20/2019.    No Known Allergies  Review of Systems  Constitutional: Negative for activity change, appetite change, chills, diaphoresis, fatigue, fever and unexpected weight change.  HENT: Positive for congestion, rhinorrhea and sneezing. Negative for dental problem, drooling, ear  discharge, ear pain, facial swelling, hearing loss, mouth sores, nosebleeds, postnasal drip, sinus pressure, sinus pain, sore throat, tinnitus, trouble swallowing and voice change.   Eyes: Negative.  Negative for photophobia and visual disturbance.  Respiratory: Negative for cough, choking, chest tightness and shortness of breath.   Cardiovascular: Negative for chest pain, palpitations and leg swelling.  Gastrointestinal: Negative for abdominal pain, blood in stool, constipation, diarrhea, nausea and vomiting.  Endocrine: Negative.  Negative for cold intolerance, heat intolerance, polydipsia, polyphagia and polyuria.  Genitourinary: Negative for decreased urine volume, difficulty urinating, dysuria, frequency and urgency.  Musculoskeletal: Negative for arthralgias and myalgias.  Skin: Negative.   Allergic/Immunologic: Negative.   Neurological: Negative for dizziness, tremors, seizures, syncope, facial asymmetry, speech difficulty, weakness, light-headedness, numbness and headaches.  Hematological: Negative.   Psychiatric/Behavioral: Negative for confusion, hallucinations, sleep disturbance and suicidal ideas.  All other systems reviewed and are negative.       Objective:  BP 119/66   Pulse 93   Temp 98.2 F (36.8 C)   Ht 5' 9" (1.753 m)   Wt 206 lb 9.6 oz (93.7 kg)   SpO2 98%   BMI 30.51 kg/m    Wt Readings from Last 3 Encounters:  08/20/19 206 lb 9.6 oz (93.7 kg)  06/24/19 213 lb 9.6 oz (96.9 kg)  05/24/19 210 lb (95.3 kg)    Physical Exam Vitals and nursing note reviewed.  Constitutional:      General: He is not in acute distress.    Appearance: Normal appearance. He is well-developed and well-groomed. He is obese. He is not ill-appearing, toxic-appearing or diaphoretic.  HENT:     Head: Normocephalic and atraumatic.     Jaw: There is normal jaw occlusion.     Right Ear: Hearing normal.     Left Ear: Hearing normal.     Nose: Nose normal.     Mouth/Throat:     Lips:  Pink.     Mouth: Mucous membranes are moist.     Pharynx: Oropharynx is clear. Uvula midline.  Eyes:     General: Lids are normal.     Extraocular Movements: Extraocular movements intact.     Conjunctiva/sclera: Conjunctivae normal.     Pupils: Pupils are equal, round, and reactive to light.  Neck:     Thyroid: No thyroid mass, thyromegaly or thyroid tenderness.     Vascular: No carotid bruit or JVD.     Trachea: Trachea and phonation normal.  Cardiovascular:     Rate and Rhythm: Normal rate and regular rhythm.  Chest Wall: PMI is not displaced.     Pulses: Normal pulses.     Heart sounds: Normal heart sounds. No murmur. No friction rub. No gallop.   Pulmonary:     Effort: Pulmonary effort is normal. No respiratory distress.     Breath sounds: Normal breath sounds. No wheezing.  Abdominal:     General: Bowel sounds are normal. There is no distension or abdominal bruit.     Palpations: Abdomen is soft. There is no hepatomegaly or splenomegaly.     Tenderness: There is no abdominal tenderness. There is no right CVA tenderness or left CVA tenderness.     Hernia: No hernia is present.  Musculoskeletal:        General: Normal range of motion.     Cervical back: Normal range of motion and neck supple.     Right lower leg: No edema.     Left lower leg: No edema.  Lymphadenopathy:     Cervical: No cervical adenopathy.  Skin:    General: Skin is warm and dry.     Capillary Refill: Capillary refill takes less than 2 seconds.     Coloration: Skin is not cyanotic, jaundiced or pale.     Findings: No rash.  Neurological:     General: No focal deficit present.     Mental Status: He is alert and oriented to person, place, and time.     Cranial Nerves: Cranial nerves are intact. No cranial nerve deficit.     Sensory: Sensation is intact. No sensory deficit.     Motor: Motor function is intact. No weakness.     Coordination: Coordination is intact. Coordination normal.     Gait: Gait is  intact. Gait normal.     Deep Tendon Reflexes: Reflexes are normal and symmetric. Reflexes normal.  Psychiatric:        Attention and Perception: Attention and perception normal.        Mood and Affect: Mood and affect normal.        Speech: Speech normal.        Behavior: Behavior normal. Behavior is cooperative.        Thought Content: Thought content normal.        Cognition and Memory: Cognition and memory normal.        Judgment: Judgment normal.     Results for orders placed or performed in visit on 06/24/19  Microscopic Examination   URINE  Result Value Ref Range   WBC, UA 0-5 0 - 5 /hpf   RBC 11-30 (A) 0 - 2 /hpf   Epithelial Cells (non renal) None seen 0 - 10 /hpf   Renal Epithel, UA None seen None seen /hpf   Mucus, UA Present Not Estab.   Bacteria, UA None seen None seen/Few  Urinalysis, Complete  Result Value Ref Range   Specific Gravity, UA 1.025 1.005 - 1.030   pH, UA 6.5 5.0 - 7.5   Color, UA Yellow Yellow   Appearance Ur Clear Clear   Leukocytes,UA 1+ (A) Negative   Protein,UA 1+ (A) Negative/Trace   Glucose, UA Negative Negative   Ketones, UA Negative Negative   RBC, UA 3+ (A) Negative   Bilirubin, UA Positive (A) Negative   Urobilinogen, Ur 2.0 (H) 0.2 - 1.0 mg/dL   Nitrite, UA Negative Negative   Microscopic Examination See below:        Pertinent labs & imaging results that were available during my care of the patient were  reviewed by me and considered in my medical decision making.  Assessment & Plan:  Erich was seen today for medical management of chronic issues, diabetes, hyperlipidemia and hypertension.  Diagnoses and all orders for this visit:  Type 2 diabetes mellitus with diabetic neuropathy, without long-term current use of insulin ([place]) Labs to be drawn next week, will adjust therapy if warranted. Diet and exercise encouraged.  -     CMP14+EGFR; Future -     CBC with Differential/Platelet; Future -     Thyroid Panel With TSH;  Future -     Bayer DCA Hb A1c Waived; Future -     Microalbumin / creatinine urine ratio; Future -     metFORMIN (GLUCOPHAGE-XR) 500 MG 24 hr tablet; TAKE 1 TABLET EVERY MORNING WITH BREAKFAST  Hyperlipidemia associated with type 2 diabetes mellitus (HCC) Diet encouraged - increase intake of fresh fruits and vegetables, increase intake of lean proteins. Bake, broil, or grill foods. Avoid fried, greasy, and fatty foods. Avoid fast foods. Increase intake of fiber-rich whole grains. Exercise encouraged - at least 150 minutes per week and advance as tolerated.  Goal BMI < 25. Continue medications as prescribed. Follow up in 3-6 months as discussed.  -     Lipid panel; Future -     atorvastatin (LIPITOR) 40 MG tablet; TAKE ONE (1) TABLET EACH DAY -     fenofibrate 160 MG tablet; TAKE ONE (1) TABLET EACH DAY  Hypertension associated with diabetes ([place]) BP well controlled. Changes were not made in regimen today. Goal BP is 130/80. Pt aware to report any persistent high or low readings. DASH diet and exercise encouraged. Exercise at least 150 minutes per week and increase as tolerated. Goal BMI > 25. Stress management encouraged. Avoid nicotine and tobacco product use. Avoid excessive alcohol and NSAID's. Avoid more than 2000 mg of sodium daily. Medications as prescribed. Follow up as scheduled.  -     Thyroid Panel With TSH; Future -     Microalbumin / creatinine urine ratio; Future -     losartan-hydrochlorothiazide (HYZAAR) 100-12.5 MG tablet; Take 1 tablet by mouth daily.  Diabetic polyneuropathy associated with type 2 diabetes mellitus ([place]) Symptoms under control. Denies need for medications at this time.   Vitamin D deficiency Labs pending. Continue repletion therapy. If indicated, will change repletion dosage. Eat foods rich in Vit D including milk, orange juice, yogurt with vitamin D added, salmon or mackerel, canned tuna fish, cereals with vitamin D added, and cod liver oil. Get out in the  sun but make sure to wear at least SPF 30 sunscreen.  -     VITAMIN D 25 Hydroxy (Vit-D Deficiency, Fractures); Future -     Vitamin D, Ergocalciferol, (DRISDOL) 1.25 MG (50000 UNIT) CAPS capsule; Take 1 capsule (50,000 Units total) by mouth every 7 (seven) days.  Benign prostatic hyperplasia without lower urinary tract symptoms Well controlled. Continue below.  -     finasteride (PROSCAR) 5 MG tablet; Take 1 tablet (5 mg total) by mouth daily. -     tamsulosin (FLOMAX) 0.4 MG CAPS capsule; Take 1 capsule (0.4 mg total) by mouth daily. (Needs to be seen before next refill)  Chronic rhinitis Well controlled. Continue Claritin and Flonase.  -     loratadine (CLARITIN) 10 MG tablet; Take 1 tablet (10 mg total) by mouth as needed for allergies.  Gastroesophageal reflux disease without esophagitis No red flags present. Diet discussed. Avoid fried, spicy, fatty, greasy, and acidic  foods. Avoid caffeine, nicotine, and alcohol. Do not eat 2-3 hours before bedtime and stay upright for at least 1-2 hours after eating. Eat small frequent meals. Avoid NSAID's like motrin and aleve. Medications as prescribed. Report any new or worsening symptoms. Follow up as discussed or sooner if needed.   -     omeprazole (PRILOSEC) 20 MG capsule; Take 1 capsule (20 mg total) by mouth daily. (Needs to be seen before next refill)     Continue all other maintenance medications.  Follow up plan: Return in about 3 months (around 11/19/2019), or if symptoms worsen or fail to improve, for DM.   Continue healthy lifestyle choices, including diet (rich in fruits, vegetables, and lean proteins, and low in salt and simple carbohydrates) and exercise (at least 30 minutes of moderate physical activity daily).  Educational handout given for DM  The above assessment and management plan was discussed with the patient. The patient verbalized understanding of and has agreed to the management plan. Patient is aware to call the clinic  if they develop any new symptoms or if symptoms persist or worsen. Patient is aware when to return to the clinic for a follow-up visit. Patient educated on when it is appropriate to go to the emergency department.   Monia Pouch, FNP-C [facility] [phone]

## 2019-08-23 ENCOUNTER — Other Ambulatory Visit: Payer: Medicare Other

## 2019-08-23 ENCOUNTER — Other Ambulatory Visit: Payer: Self-pay

## 2019-08-23 DIAGNOSIS — E1169 Type 2 diabetes mellitus with other specified complication: Secondary | ICD-10-CM | POA: Diagnosis not present

## 2019-08-23 DIAGNOSIS — E1159 Type 2 diabetes mellitus with other circulatory complications: Secondary | ICD-10-CM | POA: Diagnosis not present

## 2019-08-23 DIAGNOSIS — E1140 Type 2 diabetes mellitus with diabetic neuropathy, unspecified: Secondary | ICD-10-CM

## 2019-08-23 DIAGNOSIS — F5102 Adjustment insomnia: Secondary | ICD-10-CM | POA: Diagnosis not present

## 2019-08-23 DIAGNOSIS — E785 Hyperlipidemia, unspecified: Secondary | ICD-10-CM

## 2019-08-23 DIAGNOSIS — E119 Type 2 diabetes mellitus without complications: Secondary | ICD-10-CM | POA: Diagnosis not present

## 2019-08-23 DIAGNOSIS — J4520 Mild intermittent asthma, uncomplicated: Secondary | ICD-10-CM | POA: Diagnosis not present

## 2019-08-23 DIAGNOSIS — E559 Vitamin D deficiency, unspecified: Secondary | ICD-10-CM

## 2019-08-23 LAB — BAYER DCA HB A1C WAIVED: HB A1C (BAYER DCA - WAIVED): 7.5 % — ABNORMAL HIGH (ref <7.0)

## 2019-08-24 LAB — CMP14+EGFR
ALT: 14 IU/L (ref 0–44)
AST: 18 IU/L (ref 0–40)
Albumin/Globulin Ratio: 2.1 (ref 1.2–2.2)
Albumin: 4.2 g/dL (ref 3.8–4.8)
Alkaline Phosphatase: 60 IU/L (ref 39–117)
BUN/Creatinine Ratio: 14 (ref 10–24)
BUN: 16 mg/dL (ref 8–27)
Bilirubin Total: 0.4 mg/dL (ref 0.0–1.2)
CO2: 21 mmol/L (ref 20–29)
Calcium: 9.2 mg/dL (ref 8.6–10.2)
Chloride: 111 mmol/L — ABNORMAL HIGH (ref 96–106)
Creatinine, Ser: 1.11 mg/dL (ref 0.76–1.27)
GFR calc Af Amer: 78 mL/min/{1.73_m2} (ref >59)
GFR calc non Af Amer: 67 mL/min/{1.73_m2} (ref >59)
Globulin, Total: 2 g/dL (ref 1.5–4.5)
Glucose: 144 mg/dL — ABNORMAL HIGH (ref 65–99)
Potassium: 3.9 mmol/L (ref 3.5–5.2)
Sodium: 149 mmol/L — ABNORMAL HIGH (ref 134–144)
Total Protein: 6.2 g/dL (ref 6.0–8.5)

## 2019-08-24 LAB — CBC WITH DIFFERENTIAL/PLATELET
Basophils Absolute: 0.1 10*3/uL (ref 0.0–0.2)
Basos: 2 %
EOS (ABSOLUTE): 0.4 10*3/uL (ref 0.0–0.4)
Eos: 6 %
Hematocrit: 40.9 % (ref 37.5–51.0)
Hemoglobin: 13.8 g/dL (ref 13.0–17.7)
Immature Grans (Abs): 0 10*3/uL (ref 0.0–0.1)
Immature Granulocytes: 0 %
Lymphocytes Absolute: 1.6 10*3/uL (ref 0.7–3.1)
Lymphs: 27 %
MCH: 30.9 pg (ref 26.6–33.0)
MCHC: 33.7 g/dL (ref 31.5–35.7)
MCV: 92 fL (ref 79–97)
Monocytes Absolute: 0.5 10*3/uL (ref 0.1–0.9)
Monocytes: 9 %
Neutrophils Absolute: 3.5 10*3/uL (ref 1.4–7.0)
Neutrophils: 56 %
Platelets: 208 10*3/uL (ref 150–450)
RBC: 4.47 x10E6/uL (ref 4.14–5.80)
RDW: 12.8 % (ref 11.6–15.4)
WBC: 6.1 10*3/uL (ref 3.4–10.8)

## 2019-08-24 LAB — MICROALBUMIN / CREATININE URINE RATIO
Creatinine, Urine: 213.2 mg/dL
Microalb/Creat Ratio: 4 mg/g creat (ref 0–29)
Microalbumin, Urine: 8.5 ug/mL

## 2019-08-24 LAB — LIPID PANEL
Chol/HDL Ratio: 2.6 ratio (ref 0.0–5.0)
Cholesterol, Total: 120 mg/dL (ref 100–199)
HDL: 47 mg/dL (ref >39)
LDL Chol Calc (NIH): 61 mg/dL (ref 0–99)
Triglycerides: 54 mg/dL (ref 0–149)
VLDL Cholesterol Cal: 12 mg/dL (ref 5–40)

## 2019-08-24 LAB — THYROID PANEL WITH TSH
Free Thyroxine Index: 1.8 (ref 1.2–4.9)
T3 Uptake Ratio: 28 % (ref 24–39)
T4, Total: 6.5 ug/dL (ref 4.5–12.0)
TSH: 3.03 u[IU]/mL (ref 0.450–4.500)

## 2019-08-24 LAB — VITAMIN D 25 HYDROXY (VIT D DEFICIENCY, FRACTURES): Vit D, 25-Hydroxy: 61.2 ng/mL (ref 30.0–100.0)

## 2019-08-26 ENCOUNTER — Ambulatory Visit: Payer: Medicare Other | Admitting: Family Medicine

## 2019-11-29 ENCOUNTER — Other Ambulatory Visit: Payer: Self-pay

## 2019-11-29 ENCOUNTER — Encounter: Payer: Self-pay | Admitting: Family Medicine

## 2019-11-29 ENCOUNTER — Ambulatory Visit (INDEPENDENT_AMBULATORY_CARE_PROVIDER_SITE_OTHER): Payer: Medicare Other | Admitting: Family Medicine

## 2019-11-29 VITALS — Temp 97.9°F | Resp 20 | Ht 69.0 in | Wt 206.1 lb

## 2019-11-29 DIAGNOSIS — I10 Essential (primary) hypertension: Secondary | ICD-10-CM

## 2019-11-29 DIAGNOSIS — E1159 Type 2 diabetes mellitus with other circulatory complications: Secondary | ICD-10-CM

## 2019-11-29 DIAGNOSIS — E1140 Type 2 diabetes mellitus with diabetic neuropathy, unspecified: Secondary | ICD-10-CM | POA: Diagnosis not present

## 2019-11-29 DIAGNOSIS — E1169 Type 2 diabetes mellitus with other specified complication: Secondary | ICD-10-CM

## 2019-11-29 DIAGNOSIS — E785 Hyperlipidemia, unspecified: Secondary | ICD-10-CM

## 2019-11-29 LAB — BAYER DCA HB A1C WAIVED: HB A1C (BAYER DCA - WAIVED): 7.3 % — ABNORMAL HIGH (ref <7.0)

## 2019-11-29 NOTE — Patient Instructions (Signed)
Carbohydrate Counting for Diabetes Mellitus, Adult  Carbohydrate counting is a method of keeping track of how many carbohydrates you eat. Eating carbohydrates naturally increases the amount of sugar (glucose) in the blood. Counting how many carbohydrates you eat helps keep your blood glucose within normal limits, which helps you manage your diabetes (diabetes mellitus). It is important to know how many carbohydrates you can safely have in each meal. This is different for every person. A diet and nutrition specialist (registered dietitian) can help you make a meal plan and calculate how many carbohydrates you should have at each meal and snack. Carbohydrates are found in the following foods:  Grains, such as breads and cereals.  Dried beans and soy products.  Starchy vegetables, such as potatoes, peas, and corn.  Fruit and fruit juices.  Milk and yogurt.  Sweets and snack foods, such as cake, cookies, candy, chips, and soft drinks. How do I count carbohydrates? There are two ways to count carbohydrates in food. You can use either of the methods or a combination of both. Reading "Nutrition Facts" on packaged food The "Nutrition Facts" list is included on the labels of almost all packaged foods and beverages in the [place] It includes:  The serving size.  Information about nutrients in each serving, including the grams (g) of carbohydrate per serving. To use the "Nutrition Facts":  Decide how many servings you will have.  Multiply the number of servings by the number of carbohydrates per serving.  The resulting number is the total amount of carbohydrates that you will be having. Learning standard serving sizes of other foods When you eat carbohydrate foods that are not packaged or do not include "Nutrition Facts" on the label, you need to measure the servings in order to count the amount of carbohydrates:  Measure the foods that you will eat with a food scale or measuring cup, if  needed.  Decide how many standard-size servings you will eat.  Multiply the number of servings by 15. Most carbohydrate-rich foods have about 15 g of carbohydrates per serving. ? For example, if you eat 8 oz (170 g) of strawberries, you will have eaten 2 servings and 30 g of carbohydrates (2 servings x 15 g = 30 g).  For foods that have more than one food mixed, such as soups and casseroles, you must count the carbohydrates in each food that is included. The following list contains standard serving sizes of common carbohydrate-rich foods. Each of these servings has about 15 g of carbohydrates:   hamburger bun or  English muffin.   oz (15 mL) syrup.   oz (14 g) jelly.  1 slice of bread.  1 six-inch tortilla.  3 oz (85 g) cooked rice or pasta.  4 oz (113 g) cooked dried beans.  4 oz (113 g) starchy vegetable, such as peas, corn, or potatoes.  4 oz (113 g) hot cereal.  4 oz (113 g) mashed potatoes or  of a large baked potato.  4 oz (113 g) canned or frozen fruit.  4 oz (120 mL) fruit juice.  4-6 crackers.  6 chicken nuggets.  6 oz (170 g) unsweetened dry cereal.  6 oz (170 g) plain fat-free yogurt or yogurt sweetened with artificial sweeteners.  8 oz (240 mL) milk.  8 oz (170 g) fresh fruit or one small piece of fruit.  24 oz (680 g) popped popcorn. Example of carbohydrate counting Sample meal  3 oz (85 g) chicken breast.  6 oz (170 g)   brown rice.  4 oz (113 g) corn.  8 oz (240 mL) milk.  8 oz (170 g) strawberries with sugar-free whipped topping. Carbohydrate calculation 1. Identify the foods that contain carbohydrates: ? Rice. ? Corn. ? Milk. ? Strawberries. 2. Calculate how many servings you have of each food: ? 2 servings rice. ? 1 serving corn. ? 1 serving milk. ? 1 serving strawberries. 3. Multiply each number of servings by 15 g: ? 2 servings rice x 15 g = 30 g. ? 1 serving corn x 15 g = 15 g. ? 1 serving milk x 15 g = 15 g. ? 1  serving strawberries x 15 g = 15 g. 4. Add together all of the amounts to find the total grams of carbohydrates eaten: ? 30 g + 15 g + 15 g + 15 g = 75 g of carbohydrates total. Summary  Carbohydrate counting is a method of keeping track of how many carbohydrates you eat.  Eating carbohydrates naturally increases the amount of sugar (glucose) in the blood.  Counting how many carbohydrates you eat helps keep your blood glucose within normal limits, which helps you manage your diabetes.  A diet and nutrition specialist (registered dietitian) can help you make a meal plan and calculate how many carbohydrates you should have at each meal and snack. This information is not intended to replace advice given to you by your health care provider. Make sure you discuss any questions you have with your health care provider. Document Revised: 11/21/2016 Document Reviewed: 10/11/2015 Elsevier Patient Education  2020 [employer] Inc[employer]  

## 2019-11-29 NOTE — Progress Notes (Signed)
Subjective:  Patient ID: Roger Palmer, male    DOB: [birth year]  Age: 69 y.o. MRN: [id number]  CC: Medical Management of Chronic Issues   HPI Roger Palmer presents forFollow-up of diabetes. Patient checks blood sugar at home.  129 fasting and 153 postprandial Patient denies symptoms such as polyuria, polydipsia, excessive hunger, nausea No significant hypoglycemic spells noted. Medications reviewed. Pt reports taking them regularly without complication/adverse reaction being reported today.   Follow-up of hypertension. Patient has no history of headache chest pain or shortness of breath or recent cough. Patient also denies symptoms of TIA such as numbness weakness lateralizing. Patient checks  blood pressure at home and has not had any elevated readings recently. Patient denies side effects from his medication. States taking it regularly. Recent blood pressures are 132/87, 158/81, 139/80, and 143/85.  History Roger Palmer has a past medical history of Allergy, Back pain, Bee sting allergy, BPH (benign prostatic hypertrophy), Diabetes mellitus without complication ([employer]), Gastritis, GERD (gastroesophageal reflux disease), Herniated disc, Hypercholesteremia, Hypertension, Nephrolithiasis, Obesity, PONV (postoperative nausea and vomiting), Reflux, and Vitamin D deficiency.   He has a past surgical history that includes Inguinal hernia repair; kidney stones; ORIF R elbow (07/12/2012); ORIF R wirst (07/12/2012); Open reduction internal fixation (orif) distal radial fracture (Right, 07/11/2012); ORIF radial fracture (Right, 07/11/2012); ORIF acetabular fracture (Right, 07/14/2012); and Fracture surgery.   His family history includes ADD / ADHD in his son; Anxiety disorder in his son; CAD in his paternal grandmother; Cancer in his maternal grandmother and sister; Diabetes in his mother, sister, and sister; Heart attack in his maternal grandfather; Heart attack (age of onset: 79) in his mother; Hypertension in  his father and mother; Nephrolithiasis in his brother; Stroke in his father.He reports that he has never smoked. He has never used smokeless tobacco. He reports current alcohol use. He reports that he does not use drugs.  Current Outpatient Medications on File Prior to Visit  Medication Sig Dispense Refill  . atorvastatin (LIPITOR) 40 MG tablet TAKE ONE (1) TABLET EACH DAY 90 tablet 2  . fenofibrate 160 MG tablet TAKE ONE (1) TABLET EACH DAY 90 tablet 2  . finasteride (PROSCAR) 5 MG tablet Take 1 tablet (5 mg total) by mouth daily. 90 tablet 2  . fluticasone (FLONASE) 50 MCG/ACT nasal spray Place 2 sprays into both nostrils daily. 16 g 6  . loratadine (CLARITIN) 10 MG tablet Take 1 tablet (10 mg total) by mouth as needed for allergies. 90 tablet 2  . losartan-hydrochlorothiazide (HYZAAR) 100-12.5 MG tablet Take 1 tablet by mouth daily. 90 tablet 2  . metFORMIN (GLUCOPHAGE-XR) 500 MG 24 hr tablet TAKE 1 TABLET EVERY MORNING WITH BREAKFAST 90 tablet 2  . omeprazole (PRILOSEC) 20 MG capsule Take 1 capsule (20 mg total) by mouth daily. (Needs to be seen before next refill) 90 capsule 2  . tamsulosin (FLOMAX) 0.4 MG CAPS capsule Take 1 capsule (0.4 mg total) by mouth daily. (Needs to be seen before next refill) 90 capsule 2  . Vitamin D, Ergocalciferol, (DRISDOL) 1.25 MG (50000 UNIT) CAPS capsule Take 50,000 Units by mouth every 7 (seven) days.     No current facility-administered medications on file prior to visit.    ROS Review of Systems  Constitutional: Negative.   HENT: Negative.   Eyes: Negative for visual disturbance.  Respiratory: Negative for cough and shortness of breath.   Cardiovascular: Negative for chest pain and leg swelling.  Gastrointestinal: Negative for abdominal pain, diarrhea, nausea and  vomiting.  Genitourinary: Negative for difficulty urinating.  Musculoskeletal: Negative for arthralgias and myalgias.  Skin: Negative for rash.  Neurological: Negative for headaches.    Psychiatric/Behavioral: Negative for sleep disturbance.    Objective:  Temp 97.9 F (36.6 C) (Temporal)   Resp 20   Ht '5\' 9"'$  (1.753 m)   Wt 206 lb 2 oz (93.5 kg)   SpO2 99%   BMI 30.44 kg/m   BP Readings from Last 3 Encounters:  08/20/19 119/66  06/24/19 (!) 147/83  05/24/19 131/80    Wt Readings from Last 3 Encounters:  11/29/19 206 lb 2 oz (93.5 kg)  08/20/19 206 lb 9.6 oz (93.7 kg)  06/24/19 213 lb 9.6 oz (96.9 kg)     Physical Exam Vitals reviewed.  Constitutional:      Appearance: He is well-developed.  HENT:     Head: Normocephalic and atraumatic.     Right Ear: Tympanic membrane and external ear normal. No decreased hearing noted.     Left Ear: Tympanic membrane and external ear normal. No decreased hearing noted.     Mouth/Throat:     Pharynx: No oropharyngeal exudate or posterior oropharyngeal erythema.  Eyes:     Pupils: Pupils are equal, round, and reactive to light.  Cardiovascular:     Rate and Rhythm: Normal rate and regular rhythm.     Heart sounds: No murmur heard.   Pulmonary:     Effort: No respiratory distress.     Breath sounds: Normal breath sounds.  Abdominal:     General: Bowel sounds are normal.     Palpations: Abdomen is soft. There is no mass.     Tenderness: There is no abdominal tenderness.  Musculoskeletal:     Cervical back: Normal range of motion and neck supple.       Assessment & Plan:   Roger Palmer was seen today for medical management of chronic issues.  Diagnoses and all orders for this visit:  Type 2 diabetes mellitus with diabetic neuropathy, without long-term current use of insulin ([place]) -     Bayer DCA Hb A1c Waived -     CBC with Differential/Platelet -     CMP14+EGFR -     Lipid panel  Hyperlipidemia associated with type 2 diabetes mellitus ([place]) -     CBC with Differential/Platelet -     CMP14+EGFR -     Lipid panel  Hypertension associated with diabetes ([place]) -     CBC with Differential/Platelet -      CMP14+EGFR -     Lipid panel      I am having Roger L. Laury [name] maintain his fluticasone, atorvastatin, fenofibrate, finasteride, loratadine, losartan-hydrochlorothiazide, metFORMIN, omeprazole, tamsulosin, and Vitamin D (Ergocalciferol).  No orders of the defined types were placed in this encounter.    Follow-up: Return in about 3 months (around 02/29/2020).  Claretta Fraise, M.D.

## 2019-11-30 LAB — CBC WITH DIFFERENTIAL/PLATELET
Basophils Absolute: 0.1 10*3/uL (ref 0.0–0.2)
Basos: 1 %
EOS (ABSOLUTE): 0.2 10*3/uL (ref 0.0–0.4)
Eos: 3 %
Hematocrit: 42.1 % (ref 37.5–51.0)
Hemoglobin: 14.6 g/dL (ref 13.0–17.7)
Immature Grans (Abs): 0 10*3/uL (ref 0.0–0.1)
Immature Granulocytes: 0 %
Lymphocytes Absolute: 1.8 10*3/uL (ref 0.7–3.1)
Lymphs: 29 %
MCH: 31.3 pg (ref 26.6–33.0)
MCHC: 34.7 g/dL (ref 31.5–35.7)
MCV: 90 fL (ref 79–97)
Monocytes Absolute: 0.5 10*3/uL (ref 0.1–0.9)
Monocytes: 8 %
Neutrophils Absolute: 3.6 10*3/uL (ref 1.4–7.0)
Neutrophils: 59 %
Platelets: 207 10*3/uL (ref 150–450)
RBC: 4.66 x10E6/uL (ref 4.14–5.80)
RDW: 12.5 % (ref 11.6–15.4)
WBC: 6.2 10*3/uL (ref 3.4–10.8)

## 2019-11-30 LAB — CMP14+EGFR
ALT: 18 IU/L (ref 0–44)
AST: 24 IU/L (ref 0–40)
Albumin/Globulin Ratio: 1.6 (ref 1.2–2.2)
Albumin: 4.4 g/dL (ref 3.8–4.8)
Alkaline Phosphatase: 54 IU/L (ref 48–121)
BUN/Creatinine Ratio: 16 (ref 10–24)
BUN: 18 mg/dL (ref 8–27)
Bilirubin Total: 0.6 mg/dL (ref 0.0–1.2)
CO2: 21 mmol/L (ref 20–29)
Calcium: 9.9 mg/dL (ref 8.6–10.2)
Chloride: 109 mmol/L — ABNORMAL HIGH (ref 96–106)
Creatinine, Ser: 1.12 mg/dL (ref 0.76–1.27)
GFR calc Af Amer: 77 mL/min/{1.73_m2} (ref >59)
GFR calc non Af Amer: 66 mL/min/{1.73_m2} (ref >59)
Globulin, Total: 2.7 g/dL (ref 1.5–4.5)
Glucose: 149 mg/dL — ABNORMAL HIGH (ref 65–99)
Potassium: 4.1 mmol/L (ref 3.5–5.2)
Sodium: 147 mmol/L — ABNORMAL HIGH (ref 134–144)
Total Protein: 7.1 g/dL (ref 6.0–8.5)

## 2019-11-30 LAB — LIPID PANEL
Chol/HDL Ratio: 2.8 ratio (ref 0.0–5.0)
Cholesterol, Total: 139 mg/dL (ref 100–199)
HDL: 50 mg/dL (ref >39)
LDL Chol Calc (NIH): 77 mg/dL (ref 0–99)
Triglycerides: 56 mg/dL (ref 0–149)
VLDL Cholesterol Cal: 12 mg/dL (ref 5–40)

## 2019-11-30 NOTE — Progress Notes (Signed)
Hello Legrand,  Your lab result is normal and/or stable.Some minor variations that are not significant are commonly marked abnormal, but do not represent any medical problem for you.  Best regards, Clyde Zarrella, M.D.

## 2020-02-29 ENCOUNTER — Other Ambulatory Visit: Payer: Self-pay

## 2020-02-29 ENCOUNTER — Ambulatory Visit (INDEPENDENT_AMBULATORY_CARE_PROVIDER_SITE_OTHER): Payer: Medicare Other | Admitting: Family Medicine

## 2020-02-29 ENCOUNTER — Encounter: Payer: Self-pay | Admitting: Family Medicine

## 2020-02-29 VITALS — BP 124/74 | HR 73 | Temp 97.6°F | Ht 69.0 in | Wt 204.0 lb

## 2020-02-29 DIAGNOSIS — E559 Vitamin D deficiency, unspecified: Secondary | ICD-10-CM

## 2020-02-29 DIAGNOSIS — E1140 Type 2 diabetes mellitus with diabetic neuropathy, unspecified: Secondary | ICD-10-CM | POA: Diagnosis not present

## 2020-02-29 DIAGNOSIS — Z23 Encounter for immunization: Secondary | ICD-10-CM | POA: Diagnosis not present

## 2020-02-29 DIAGNOSIS — E1169 Type 2 diabetes mellitus with other specified complication: Secondary | ICD-10-CM | POA: Diagnosis not present

## 2020-02-29 DIAGNOSIS — N401 Enlarged prostate with lower urinary tract symptoms: Secondary | ICD-10-CM

## 2020-02-29 DIAGNOSIS — N400 Enlarged prostate without lower urinary tract symptoms: Secondary | ICD-10-CM

## 2020-02-29 DIAGNOSIS — E1159 Type 2 diabetes mellitus with other circulatory complications: Secondary | ICD-10-CM

## 2020-02-29 DIAGNOSIS — E785 Hyperlipidemia, unspecified: Secondary | ICD-10-CM

## 2020-02-29 DIAGNOSIS — Z1159 Encounter for screening for other viral diseases: Secondary | ICD-10-CM

## 2020-02-29 DIAGNOSIS — K219 Gastro-esophageal reflux disease without esophagitis: Secondary | ICD-10-CM

## 2020-02-29 DIAGNOSIS — J310 Chronic rhinitis: Secondary | ICD-10-CM

## 2020-02-29 DIAGNOSIS — I152 Hypertension secondary to endocrine disorders: Secondary | ICD-10-CM

## 2020-02-29 LAB — BAYER DCA HB A1C WAIVED: HB A1C (BAYER DCA - WAIVED): 7 % — ABNORMAL HIGH (ref <7.0)

## 2020-02-29 MED ORDER — FLUTICASONE PROPIONATE 50 MCG/ACT NA SUSP: 2.0000 | Freq: Every day | NASAL | 6 refills | Status: DC

## 2020-02-29 MED ORDER — ATORVASTATIN CALCIUM 40 MG PO TABS: ORAL_TABLET | ORAL | 2 refills | Status: DC

## 2020-02-29 MED ORDER — FENOFIBRATE 160 MG PO TABS: ORAL_TABLET | ORAL | 2 refills | Status: DC

## 2020-02-29 MED ORDER — LOSARTAN POTASSIUM-HCTZ 100-12.5 MG PO TABS: 1.0000 | ORAL_TABLET | Freq: Every day | ORAL | 2 refills | Status: DC

## 2020-02-29 MED ORDER — OMEPRAZOLE 20 MG PO CPDR: 20.0000 mg | DELAYED_RELEASE_CAPSULE | Freq: Every day | ORAL | 2 refills | Status: DC

## 2020-02-29 MED ORDER — TAMSULOSIN HCL 0.4 MG PO CAPS: 0.4000 mg | ORAL_CAPSULE | Freq: Every day | ORAL | 2 refills | Status: DC

## 2020-02-29 MED ORDER — METFORMIN HCL ER 500 MG PO TB24: ORAL_TABLET | ORAL | 2 refills | Status: DC

## 2020-02-29 MED ORDER — FINASTERIDE 5 MG PO TABS: 5.0000 mg | ORAL_TABLET | Freq: Every day | ORAL | 2 refills | Status: DC

## 2020-02-29 MED ORDER — LORATADINE 10 MG PO TABS: 10.0000 mg | ORAL_TABLET | ORAL | 2 refills | Status: DC | PRN

## 2020-02-29 NOTE — Progress Notes (Signed)
Subjective:  Patient ID: Roger Palmer,  male    DOB: 28-Jun-1950  Age: 69 y.o.    CC: Follow-up (3 month)   HPI ARCH METHOT presents for  follow-up of hypertension. Patient has no history of headache chest pain or shortness of breath or recent cough. Patient also denies symptoms of TIA such as numbness weakness lateralizing. Patient denies side effects from medication. States taking it regularly.  Patient also  in for follow-up of elevated cholesterol. Doing well without complaints on current medication. Denies side effects  including myalgia and arthralgia and nausea. Also in today for liver function testing. Currently no chest pain, shortness of breath or other cardiovascular related symptoms noted.  Follow-up of diabetes. Patient does check blood sugar at home. Readings run between 140 and 150 Patient denies symptoms such as excessive hunger or urinary frequency, excessive hunger, nausea No significant hypoglycemic spells noted. Medications reviewed. Pt reports taking them regularly. Pt. denies complication/adverse reaction today.   Doing well with BPH and that he has some urgency and his stream is a little slow first couple of times in the morning.  However he is able to sleep all night without urinating and for the first couple of days to go to the restroom for urination in the morning with the urgency occasionally   History Amarian has a past medical history of Allergy, Back pain, Bee sting allergy, BPH (benign prostatic hypertrophy), Diabetes mellitus without complication ([place]), Gastritis, GERD (gastroesophageal reflux disease), Herniated disc, Hypercholesteremia, Hypertension, Nephrolithiasis, Obesity, PONV (postoperative nausea and vomiting), Reflux, and Vitamin D deficiency.   He has a past surgical history that includes Inguinal hernia repair; kidney stones; ORIF R elbow (07/12/2012); ORIF R wirst (07/12/2012); Open reduction internal fixation (orif) distal radial fracture  (Right, 07/11/2012); ORIF radial fracture (Right, 07/11/2012); ORIF acetabular fracture (Right, 07/14/2012); and Fracture surgery.   His family history includes ADD / ADHD in his son; Anxiety disorder in his son; CAD in his paternal grandmother; Cancer in his maternal grandmother and sister; Diabetes in his mother, sister, and sister; Heart attack in his maternal grandfather; Heart attack (age of onset: 48) in his mother; Hypertension in his father and mother; Nephrolithiasis in his brother; Stroke in his father.He reports that he has never smoked. He has never used smokeless tobacco. He reports current alcohol use. He reports that he does not use drugs.  Current Outpatient Medications on File Prior to Visit  Medication Sig Dispense Refill   Vitamin D, Ergocalciferol, (DRISDOL) 1.25 MG (50000 UNIT) CAPS capsule Take 50,000 Units by mouth every 7 (seven) days.     No current facility-administered medications on file prior to visit.    ROS Review of Systems  Constitutional: Negative.   HENT: Negative.   Eyes: Negative for visual disturbance.  Respiratory: Negative for cough and shortness of breath.   Cardiovascular: Negative for chest pain and leg swelling.  Gastrointestinal: Negative for abdominal pain, diarrhea, nausea and vomiting.  Genitourinary: Positive for difficulty urinating and urgency. Negative for dysuria and frequency.  Musculoskeletal: Positive for arthralgias (right elbow aand hip). Negative for myalgias.  Skin: Negative for rash.  Neurological: Negative for headaches.  Psychiatric/Behavioral: Negative for sleep disturbance.    Objective:  BP 124/74    Pulse 73    Temp 97.6 F (36.4 C) (Temporal)    Ht $R'5\' 9"'dC$  (1.753 m)    Wt 204 lb (92.5 kg)    BMI 30.13 kg/m   BP Readings from Last 3 Encounters:  02/29/20 124/74  08/20/19 119/66  06/24/19 (!) 147/83    Wt Readings from Last 3 Encounters:  02/29/20 204 lb (92.5 kg)  11/29/19 206 lb 2 oz (93.5 kg)  08/20/19 206 lb 9.6 oz  (93.7 kg)     Physical Exam Vitals reviewed.  Constitutional:      Appearance: He is well-developed.  HENT:     Head: Normocephalic and atraumatic.     Right Ear: Tympanic membrane and external ear normal. No decreased hearing noted.     Left Ear: Tympanic membrane and external ear normal. No decreased hearing noted.     Mouth/Throat:     Pharynx: No oropharyngeal exudate or posterior oropharyngeal erythema.  Eyes:     Pupils: Pupils are equal, round, and reactive to light.  Cardiovascular:     Rate and Rhythm: Normal rate and regular rhythm.     Heart sounds: No murmur heard.   Pulmonary:     Effort: No respiratory distress.     Breath sounds: Normal breath sounds.  Abdominal:     General: Bowel sounds are normal.     Palpations: Abdomen is soft. There is no mass.     Tenderness: There is no abdominal tenderness.  Musculoskeletal:     Cervical back: Normal range of motion and neck supple.     Diabetic Foot Exam - Simple   No data filed        Assessment & Plan:   Demetrias was seen today for follow-up.  Diagnoses and all orders for this visit:  Type 2 diabetes mellitus with diabetic neuropathy, without long-term current use of insulin ([facility]) -     CBC with Differential/Platelet -     CMP14+EGFR -     Lipid panel -     Bayer DCA Hb A1c Waived -     metFORMIN (GLUCOPHAGE-XR) 500 MG 24 hr tablet; TAKE 1 TABLET EVERY MORNING WITH BREAKFAST  Hyperlipidemia associated with type 2 diabetes mellitus ([facility]) -     CBC with Differential/Platelet -     CMP14+EGFR -     Lipid panel -     atorvastatin (LIPITOR) 40 MG tablet; TAKE ONE (1) TABLET EACH DAY -     fenofibrate 160 MG tablet; TAKE ONE (1) TABLET EACH DAY  Hypertension associated with diabetes ([place]) -     CBC with Differential/Platelet -     CMP14+EGFR -     Lipid panel -     losartan-hydrochlorothiazide (HYZAAR) 100-12.5 MG tablet; Take 1 tablet by mouth daily.  Vitamin D deficiency -     CBC with  Differential/Platelet -     CMP14+EGFR -     VITAMIN D 25 Hydroxy (Vit-D Deficiency, Fractures)  Gastroesophageal reflux disease without esophagitis -     CBC with Differential/Platelet -     CMP14+EGFR -     omeprazole (PRILOSEC) 20 MG capsule; Take 1 capsule (20 mg total) by mouth daily. (Needs to be seen before next refill)  Need for hepatitis C screening test -     Hepatitis C antibody  Benign prostatic hyperplasia with lower urinary tract symptoms, symptom details unspecified  Benign prostatic hyperplasia without lower urinary tract symptoms -     finasteride (PROSCAR) 5 MG tablet; Take 1 tablet (5 mg total) by mouth daily. -     tamsulosin (FLOMAX) 0.4 MG CAPS capsule; Take 1 capsule (0.4 mg total) by mouth daily. (Needs to be seen before next refill)  Chronic rhinitis -  fluticasone (FLONASE) 50 MCG/ACT nasal spray; Place 2 sprays into both nostrils daily. -     loratadine (CLARITIN) 10 MG tablet; Take 1 tablet (10 mg total) by mouth as needed for allergies.   I am having Ronit L. Nading [name] maintain his Vitamin D (Ergocalciferol), atorvastatin, fenofibrate, finasteride, fluticasone, loratadine, losartan-hydrochlorothiazide, metFORMIN, omeprazole, and tamsulosin.  Meds ordered this encounter  Medications   atorvastatin (LIPITOR) 40 MG tablet    Sig: TAKE ONE (1) TABLET EACH DAY    Dispense:  90 tablet    Refill:  2   fenofibrate 160 MG tablet    Sig: TAKE ONE (1) TABLET EACH DAY    Dispense:  90 tablet    Refill:  2   finasteride (PROSCAR) 5 MG tablet    Sig: Take 1 tablet (5 mg total) by mouth daily.    Dispense:  90 tablet    Refill:  2   fluticasone (FLONASE) 50 MCG/ACT nasal spray    Sig: Place 2 sprays into both nostrils daily.    Dispense:  16 g    Refill:  6   loratadine (CLARITIN) 10 MG tablet    Sig: Take 1 tablet (10 mg total) by mouth as needed for allergies.    Dispense:  90 tablet    Refill:  2   losartan-hydrochlorothiazide (HYZAAR)  100-12.5 MG tablet    Sig: Take 1 tablet by mouth daily.    Dispense:  90 tablet    Refill:  2   metFORMIN (GLUCOPHAGE-XR) 500 MG 24 hr tablet    Sig: TAKE 1 TABLET EVERY MORNING WITH BREAKFAST    Dispense:  90 tablet    Refill:  2   omeprazole (PRILOSEC) 20 MG capsule    Sig: Take 1 capsule (20 mg total) by mouth daily. (Needs to be seen before next refill)    Dispense:  90 capsule    Refill:  2   tamsulosin (FLOMAX) 0.4 MG CAPS capsule    Sig: Take 1 capsule (0.4 mg total) by mouth daily. (Needs to be seen before next refill)    Dispense:  90 capsule    Refill:  2     Follow-up: Return in about 3 months (around 05/31/2020).  Claretta Fraise, M.D.

## 2020-03-01 LAB — LIPID PANEL
Chol/HDL Ratio: 2.8 ratio (ref 0.0–5.0)
Cholesterol, Total: 130 mg/dL (ref 100–199)
HDL: 47 mg/dL (ref >39)
LDL Chol Calc (NIH): 71 mg/dL (ref 0–99)
Triglycerides: 56 mg/dL (ref 0–149)
VLDL Cholesterol Cal: 12 mg/dL (ref 5–40)

## 2020-03-01 LAB — CMP14+EGFR
ALT: 24 IU/L (ref 0–44)
AST: 23 IU/L (ref 0–40)
Albumin/Globulin Ratio: 1.7 (ref 1.2–2.2)
Albumin: 4.3 g/dL (ref 3.8–4.8)
Alkaline Phosphatase: 50 IU/L (ref 44–121)
BUN/Creatinine Ratio: 14 (ref 10–24)
BUN: 16 mg/dL (ref 8–27)
Bilirubin Total: 0.6 mg/dL (ref 0.0–1.2)
CO2: 24 mmol/L (ref 20–29)
Calcium: 10 mg/dL (ref 8.6–10.2)
Chloride: 103 mmol/L (ref 96–106)
Creatinine, Ser: 1.17 mg/dL (ref 0.76–1.27)
GFR calc Af Amer: 73 mL/min/{1.73_m2} (ref >59)
GFR calc non Af Amer: 63 mL/min/{1.73_m2} (ref >59)
Globulin, Total: 2.5 g/dL (ref 1.5–4.5)
Glucose: 139 mg/dL — ABNORMAL HIGH (ref 65–99)
Potassium: 4.4 mmol/L (ref 3.5–5.2)
Sodium: 142 mmol/L (ref 134–144)
Total Protein: 6.8 g/dL (ref 6.0–8.5)

## 2020-03-01 LAB — HEPATITIS C ANTIBODY: Hep C Virus Ab: 0.2 s/co ratio (ref 0.0–0.9)

## 2020-03-01 LAB — CBC WITH DIFFERENTIAL/PLATELET
Basophils Absolute: 0.1 10*3/uL (ref 0.0–0.2)
Basos: 1 %
EOS (ABSOLUTE): 0.1 10*3/uL (ref 0.0–0.4)
Eos: 2 %
Hematocrit: 41.9 % (ref 37.5–51.0)
Hemoglobin: 14.4 g/dL (ref 13.0–17.7)
Immature Grans (Abs): 0 10*3/uL (ref 0.0–0.1)
Immature Granulocytes: 0 %
Lymphocytes Absolute: 1.8 10*3/uL (ref 0.7–3.1)
Lymphs: 36 %
MCH: 31.5 pg (ref 26.6–33.0)
MCHC: 34.4 g/dL (ref 31.5–35.7)
MCV: 92 fL (ref 79–97)
Monocytes Absolute: 0.5 10*3/uL (ref 0.1–0.9)
Monocytes: 10 %
Neutrophils Absolute: 2.6 10*3/uL (ref 1.4–7.0)
Neutrophils: 51 %
Platelets: 197 10*3/uL (ref 150–450)
RBC: 4.57 x10E6/uL (ref 4.14–5.80)
RDW: 12.7 % (ref 11.6–15.4)
WBC: 5.2 10*3/uL (ref 3.4–10.8)

## 2020-03-01 LAB — VITAMIN D 25 HYDROXY (VIT D DEFICIENCY, FRACTURES): Vit D, 25-Hydroxy: 68.4 ng/mL (ref 30.0–100.0)

## 2020-06-02 DIAGNOSIS — H40033 Anatomical narrow angle, bilateral: Secondary | ICD-10-CM | POA: Diagnosis not present

## 2020-06-02 DIAGNOSIS — E119 Type 2 diabetes mellitus without complications: Secondary | ICD-10-CM | POA: Diagnosis not present

## 2020-06-02 LAB — HM DIABETES EYE EXAM

## 2020-06-05 ENCOUNTER — Ambulatory Visit: Payer: Medicare Other | Admitting: Family Medicine

## 2020-06-30 ENCOUNTER — Ambulatory Visit (INDEPENDENT_AMBULATORY_CARE_PROVIDER_SITE_OTHER): Payer: Medicare Other | Admitting: Family Medicine

## 2020-06-30 ENCOUNTER — Other Ambulatory Visit: Payer: Self-pay

## 2020-06-30 ENCOUNTER — Encounter: Payer: Self-pay | Admitting: Family Medicine

## 2020-06-30 VITALS — BP 133/83 | HR 91 | Temp 97.4°F | Resp 20 | Ht 69.0 in | Wt 210.0 lb

## 2020-06-30 DIAGNOSIS — E785 Hyperlipidemia, unspecified: Secondary | ICD-10-CM | POA: Diagnosis not present

## 2020-06-30 DIAGNOSIS — E1140 Type 2 diabetes mellitus with diabetic neuropathy, unspecified: Secondary | ICD-10-CM

## 2020-06-30 DIAGNOSIS — E1159 Type 2 diabetes mellitus with other circulatory complications: Secondary | ICD-10-CM

## 2020-06-30 DIAGNOSIS — E1169 Type 2 diabetes mellitus with other specified complication: Secondary | ICD-10-CM | POA: Diagnosis not present

## 2020-06-30 DIAGNOSIS — I152 Hypertension secondary to endocrine disorders: Secondary | ICD-10-CM

## 2020-06-30 LAB — BAYER DCA HB A1C WAIVED: HB A1C (BAYER DCA - WAIVED): 7.1 % — ABNORMAL HIGH (ref <7.0)

## 2020-06-30 NOTE — Progress Notes (Signed)
Subjective:  Patient ID: Roger Palmer,  male    DOB: [birth year]  Age: 70 y.o.    CC: Medical Management of Chronic Issues   HPI [name] presents for  follow-up of hypertension. Patient has no history of headache chest pain or shortness of breath or recent cough. Patient also denies symptoms of TIA such as numbness weakness lateralizing. Patient denies side effects from medication. States taking it regularly. Home readings 540-086 systolic. Extensive log returned.  Patient also  in for follow-up of elevated cholesterol. Doing well without complaints on current medication. Denies side effects  including myalgia and arthralgia and nausea. Also in today for liver function testing. Currently no chest pain, shortness of breath or other cardiovascular related symptoms noted.  Follow-up of diabetes. Patient does check blood sugar at home. Readings run between 110 and 150 fasting. 150-200 for most prandial readings. Patient denies symptoms such as excessive hunger or urinary frequency, excessive hunger, nausea No significant hypoglycemic spells noted. Medications reviewed. Pt reports taking them regularly. Pt. denies complication/adverse reaction today.    History [name] has a past medical history of Allergy, Back pain, Bee sting allergy, BPH (benign prostatic hypertrophy), Diabetes mellitus without complication ([place]), Gastritis, GERD (gastroesophageal reflux disease), Herniated disc, Hypercholesteremia, Hypertension, Nephrolithiasis, Obesity, PONV (postoperative nausea and vomiting), Reflux, and Vitamin D deficiency.   He has a past surgical history that includes Inguinal hernia repair; kidney stones; ORIF R elbow (07/12/2012); ORIF R wirst (07/12/2012); Open reduction internal fixation (orif) distal radial fracture (Right, 07/11/2012); ORIF radial fracture (Right, 07/11/2012); ORIF acetabular fracture (Right, 07/14/2012); and Fracture surgery.   His family history includes ADD / ADHD in his  son; Anxiety disorder in his son; CAD in his paternal grandmother; Cancer in his maternal grandmother and sister; Diabetes in his mother, sister, and sister; Heart attack in his maternal grandfather; Heart attack (age of onset: 6) in his mother; Hypertension in his father and mother; Nephrolithiasis in his brother; Stroke in his father.He reports that he has never smoked. He has never used smokeless tobacco. He reports current alcohol use. He reports that he does not use drugs.  Current Outpatient Medications on File Prior to Visit  Medication Sig Dispense Refill  . atorvastatin (LIPITOR) 40 MG tablet TAKE ONE (1) TABLET EACH DAY 90 tablet 2  . fenofibrate 160 MG tablet TAKE ONE (1) TABLET EACH DAY 90 tablet 2  . finasteride (PROSCAR) 5 MG tablet Take 1 tablet (5 mg total) by mouth daily. 90 tablet 2  . loratadine (CLARITIN) 10 MG tablet Take 1 tablet (10 mg total) by mouth as needed for allergies. 90 tablet 2  . losartan-hydrochlorothiazide (HYZAAR) 100-12.5 MG tablet Take 1 tablet by mouth daily. 90 tablet 2  . metFORMIN (GLUCOPHAGE-XR) 500 MG 24 hr tablet TAKE 1 TABLET EVERY MORNING WITH BREAKFAST 90 tablet 2  . omeprazole (PRILOSEC) 20 MG capsule Take 1 capsule (20 mg total) by mouth daily. (Needs to be seen before next refill) 90 capsule 2  . tamsulosin (FLOMAX) 0.4 MG CAPS capsule Take 1 capsule (0.4 mg total) by mouth daily. (Needs to be seen before next refill) 90 capsule 2  . Vitamin D, Ergocalciferol, (DRISDOL) 1.25 MG (50000 UNIT) CAPS capsule Take 50,000 Units by mouth every 7 (seven) days.     No current facility-administered medications on file prior to visit.    ROS Review of Systems  Constitutional: Negative for fever.  Respiratory: Negative for shortness of breath.   Cardiovascular: Negative for chest  pain.  Musculoskeletal: Negative for arthralgias.  Skin: Negative for rash.    Objective:  BP 133/83   Pulse 91   Temp (!) 97.4 F (36.3 C) (Temporal)   Resp 20   Ht 5'  9" (1.753 m)   Wt 210 lb (95.3 kg)   BMI 31.01 kg/m   BP Readings from Last 3 Encounters:  06/30/20 133/83  02/29/20 124/74  08/20/19 119/66    Wt Readings from Last 3 Encounters:  06/30/20 210 lb (95.3 kg)  02/29/20 204 lb (92.5 kg)  11/29/19 206 lb 2 oz (93.5 kg)     Physical Exam Vitals reviewed.  Constitutional:      Appearance: He is well-developed and well-nourished.  HENT:     Head: Normocephalic and atraumatic.     Right Ear: Tympanic membrane and external ear normal. No decreased hearing noted.     Left Ear: Tympanic membrane and external ear normal. No decreased hearing noted.     Mouth/Throat:     Pharynx: No oropharyngeal exudate or posterior oropharyngeal erythema.  Eyes:     Pupils: Pupils are equal, round, and reactive to light.  Cardiovascular:     Rate and Rhythm: Normal rate and regular rhythm.     Heart sounds: No murmur heard.   Pulmonary:     Effort: No respiratory distress.     Breath sounds: Normal breath sounds.  Abdominal:     General: Bowel sounds are normal.     Palpations: Abdomen is soft. There is no mass.     Tenderness: There is no abdominal tenderness.  Musculoskeletal:     Cervical back: Normal range of motion and neck supple.     Diabetic Foot Exam - Simple   No data filed       Assessment & Plan:   Daune was seen today for medical management of chronic issues.  Diagnoses and all orders for this visit:  Type 2 diabetes mellitus with diabetic neuropathy, without long-term current use of insulin ([place]) -     Bayer DCA Hb A1c Waived -     CBC with Differential/Platelet -     CMP14+EGFR -     Lipid panel  Hyperlipidemia associated with type 2 diabetes mellitus ([place]) -     CBC with Differential/Platelet -     CMP14+EGFR -     Lipid panel  Hypertension associated with diabetes ([place]) -     CBC with Differential/Platelet -     CMP14+EGFR -     Lipid panel   I have discontinued [name]'s fluticasone.  I am also having him maintain his Vitamin D (Ergocalciferol), atorvastatin, fenofibrate, finasteride, loratadine, losartan-hydrochlorothiazide, metFORMIN, omeprazole, and tamsulosin.  No orders of the defined types were placed in this encounter.    Follow-up: No follow-ups on file.  Claretta Fraise, M.D.

## 2020-07-01 LAB — CBC WITH DIFFERENTIAL/PLATELET
Basophils Absolute: 0.1 10*3/uL (ref 0.0–0.2)
Basos: 1 %
EOS (ABSOLUTE): 0.1 10*3/uL (ref 0.0–0.4)
Eos: 2 %
Hematocrit: 41.1 % (ref 37.5–51.0)
Hemoglobin: 14 g/dL (ref 13.0–17.7)
Immature Grans (Abs): 0 10*3/uL (ref 0.0–0.1)
Immature Granulocytes: 0 %
Lymphocytes Absolute: 2.1 10*3/uL (ref 0.7–3.1)
Lymphs: 32 %
MCH: 30.4 pg (ref 26.6–33.0)
MCHC: 34.1 g/dL (ref 31.5–35.7)
MCV: 89 fL (ref 79–97)
Monocytes Absolute: 0.6 10*3/uL (ref 0.1–0.9)
Monocytes: 10 %
Neutrophils Absolute: 3.7 10*3/uL (ref 1.4–7.0)
Neutrophils: 55 %
Platelets: 214 10*3/uL (ref 150–450)
RBC: 4.61 x10E6/uL (ref 4.14–5.80)
RDW: 12.5 % (ref 11.6–15.4)
WBC: 6.6 10*3/uL (ref 3.4–10.8)

## 2020-07-01 LAB — CMP14+EGFR
ALT: 24 IU/L (ref 0–44)
AST: 32 IU/L (ref 0–40)
Albumin/Globulin Ratio: 1.6 (ref 1.2–2.2)
Albumin: 4.3 g/dL (ref 3.8–4.8)
Alkaline Phosphatase: 48 IU/L (ref 44–121)
BUN/Creatinine Ratio: 12 (ref 10–24)
BUN: 14 mg/dL (ref 8–27)
Bilirubin Total: 0.5 mg/dL (ref 0.0–1.2)
CO2: 22 mmol/L (ref 20–29)
Calcium: 9.8 mg/dL (ref 8.6–10.2)
Chloride: 104 mmol/L (ref 96–106)
Creatinine, Ser: 1.14 mg/dL (ref 0.76–1.27)
GFR calc Af Amer: 75 mL/min/{1.73_m2} (ref >59)
GFR calc non Af Amer: 65 mL/min/{1.73_m2} (ref >59)
Globulin, Total: 2.7 g/dL (ref 1.5–4.5)
Glucose: 119 mg/dL — ABNORMAL HIGH (ref 65–99)
Potassium: 4 mmol/L (ref 3.5–5.2)
Sodium: 141 mmol/L (ref 134–144)
Total Protein: 7 g/dL (ref 6.0–8.5)

## 2020-07-01 LAB — LIPID PANEL
Chol/HDL Ratio: 2.8 ratio (ref 0.0–5.0)
Cholesterol, Total: 125 mg/dL (ref 100–199)
HDL: 44 mg/dL (ref >39)
LDL Chol Calc (NIH): 68 mg/dL (ref 0–99)
Triglycerides: 58 mg/dL (ref 0–149)
VLDL Cholesterol Cal: 13 mg/dL (ref 5–40)

## 2020-07-02 NOTE — Progress Notes (Signed)
Hello Shyquan,  Your lab result is normal and/or stable.Some minor variations that are not significant are commonly marked abnormal, but do not represent any medical problem for you.  Best regards, Tron Flythe, M.D.

## 2020-09-27 ENCOUNTER — Other Ambulatory Visit: Payer: Self-pay

## 2020-09-27 ENCOUNTER — Encounter: Payer: Self-pay | Admitting: Family Medicine

## 2020-09-27 ENCOUNTER — Ambulatory Visit (INDEPENDENT_AMBULATORY_CARE_PROVIDER_SITE_OTHER): Payer: Medicare Other | Admitting: Family Medicine

## 2020-09-27 VITALS — BP 121/71 | HR 84 | Temp 97.2°F | Ht 69.0 in | Wt 208.4 lb

## 2020-09-27 DIAGNOSIS — E1159 Type 2 diabetes mellitus with other circulatory complications: Secondary | ICD-10-CM | POA: Diagnosis not present

## 2020-09-27 DIAGNOSIS — E1140 Type 2 diabetes mellitus with diabetic neuropathy, unspecified: Secondary | ICD-10-CM

## 2020-09-27 DIAGNOSIS — E1169 Type 2 diabetes mellitus with other specified complication: Secondary | ICD-10-CM

## 2020-09-27 DIAGNOSIS — E785 Hyperlipidemia, unspecified: Secondary | ICD-10-CM

## 2020-09-27 DIAGNOSIS — I152 Hypertension secondary to endocrine disorders: Secondary | ICD-10-CM

## 2020-09-27 LAB — BAYER DCA HB A1C WAIVED: HB A1C (BAYER DCA - WAIVED): 7.4 % — ABNORMAL HIGH (ref <7.0)

## 2020-09-27 NOTE — Progress Notes (Signed)
Subjective:  Patient ID: Roger Palmer, male    DOB: [birth year]  Age: 70 y.o. MRN: [id number]  CC: Medical Management of Chronic Issues   HPI ADEEB KONECNY presents forFollow-up of diabetes. Patient checks blood sugar at home.  About the same as before. Patient denies symptoms such as polyuria, polydipsia, excessive hunger, nausea No significant hypoglycemic spells noted. Medications reviewed. Pt reports taking them regularly without complication/adverse reaction being reported today.  Snacking more over the last three months.    History Davieon has a past medical history of Allergy, Back pain, Bee sting allergy, BPH (benign prostatic hypertrophy), Diabetes mellitus without complication ([place]), Gastritis, GERD (gastroesophageal reflux disease), Herniated disc, Hypercholesteremia, Hypertension, Nephrolithiasis, Obesity, PONV (postoperative nausea and vomiting), Reflux, and Vitamin D deficiency.   He has a past surgical history that includes Inguinal hernia repair; kidney stones; ORIF R elbow (07/12/2012); ORIF R wirst (07/12/2012); Open reduction internal fixation (orif) distal radial fracture (Right, 07/11/2012); ORIF radial fracture (Right, 07/11/2012); ORIF acetabular fracture (Right, 07/14/2012); and Fracture surgery.   His family history includes ADD / ADHD in his son; Anxiety disorder in his son; CAD in his paternal grandmother; Cancer in his maternal grandmother and sister; Diabetes in his mother, sister, and sister; Heart attack in his maternal grandfather; Heart attack (age of onset: 59) in his mother; Hypertension in his father and mother; Nephrolithiasis in his brother; Stroke in his father.He reports that he has never smoked. He has never used smokeless tobacco. He reports current alcohol use. He reports that he does not use drugs.  Current Outpatient Medications on File Prior to Visit  Medication Sig Dispense Refill  . atorvastatin (LIPITOR) 40 MG tablet TAKE ONE (1) TABLET EACH DAY 90  tablet 2  . fenofibrate 160 MG tablet TAKE ONE (1) TABLET EACH DAY 90 tablet 2  . finasteride (PROSCAR) 5 MG tablet Take 1 tablet (5 mg total) by mouth daily. 90 tablet 2  . loratadine (CLARITIN) 10 MG tablet Take 1 tablet (10 mg total) by mouth as needed for allergies. 90 tablet 2  . losartan-hydrochlorothiazide (HYZAAR) 100-12.5 MG tablet Take 1 tablet by mouth daily. 90 tablet 2  . metFORMIN (GLUCOPHAGE-XR) 500 MG 24 hr tablet TAKE 1 TABLET EVERY MORNING WITH BREAKFAST 90 tablet 2  . omeprazole (PRILOSEC) 20 MG capsule Take 1 capsule (20 mg total) by mouth daily. (Needs to be seen before next refill) 90 capsule 2  . tamsulosin (FLOMAX) 0.4 MG CAPS capsule Take 1 capsule (0.4 mg total) by mouth daily. (Needs to be seen before next refill) 90 capsule 2  . Vitamin D, Ergocalciferol, (DRISDOL) 1.25 MG (50000 UNIT) CAPS capsule Take 50,000 Units by mouth every 7 (seven) days.     No current facility-administered medications on file prior to visit.    ROS Review of Systems  Objective:  BP 121/71   Pulse 84   Temp (!) 97.2 F (36.2 C)   Ht $R'5\' 9"'Xg$  (1.753 m)   Wt 208 lb 6.4 oz (94.5 kg)   SpO2 98%   BMI 30.78 kg/m   BP Readings from Last 3 Encounters:  09/27/20 121/71  06/30/20 133/83  02/29/20 124/74    Wt Readings from Last 3 Encounters:  09/27/20 208 lb 6.4 oz (94.5 kg)  06/30/20 210 lb (95.3 kg)  02/29/20 204 lb (92.5 kg)     Physical Exam    Assessment & Plan:   Elin was seen today for medical management of chronic issues.  Diagnoses and all  orders for this visit:  Type 2 diabetes mellitus with diabetic neuropathy, without long-term current use of insulin ([place]) -     Bayer DCA Hb A1c Waived -     CBC with Differential/Platelet -     CMP14+EGFR  Hyperlipidemia associated with type 2 diabetes mellitus ([place]) -     Lipid panel  Hypertension associated with diabetes ([place])      I am having Britt L. Dacruz [name] maintain his Vitamin D (Ergocalciferol),  atorvastatin, fenofibrate, finasteride, loratadine, losartan-hydrochlorothiazide, metFORMIN, omeprazole, and tamsulosin.  No orders of the defined types were placed in this encounter.    Follow-up: Return in about 3 months (around 12/28/2020).  Claretta Fraise, M.D.

## 2020-09-28 LAB — CMP14+EGFR
ALT: 25 IU/L (ref 0–44)
AST: 25 IU/L (ref 0–40)
Albumin/Globulin Ratio: 1.4 (ref 1.2–2.2)
Albumin: 4.4 g/dL (ref 3.7–4.7)
Alkaline Phosphatase: 57 IU/L (ref 44–121)
BUN/Creatinine Ratio: 13 (ref 10–24)
BUN: 17 mg/dL (ref 8–27)
Bilirubin Total: 0.6 mg/dL (ref 0.0–1.2)
CO2: 24 mmol/L (ref 20–29)
Calcium: 10 mg/dL (ref 8.6–10.2)
Chloride: 105 mmol/L (ref 96–106)
Creatinine, Ser: 1.32 mg/dL — ABNORMAL HIGH (ref 0.76–1.27)
Globulin, Total: 3.1 g/dL (ref 1.5–4.5)
Glucose: 159 mg/dL — ABNORMAL HIGH (ref 65–99)
Potassium: 4.5 mmol/L (ref 3.5–5.2)
Sodium: 143 mmol/L (ref 134–144)
Total Protein: 7.5 g/dL (ref 6.0–8.5)
eGFR: 58 mL/min/{1.73_m2} — ABNORMAL LOW (ref >59)

## 2020-09-28 LAB — CBC WITH DIFFERENTIAL/PLATELET
Basophils Absolute: 0.1 10*3/uL (ref 0.0–0.2)
Basos: 1 %
EOS (ABSOLUTE): 0.2 10*3/uL (ref 0.0–0.4)
Eos: 3 %
Hematocrit: 44.7 % (ref 37.5–51.0)
Hemoglobin: 15 g/dL (ref 13.0–17.7)
Immature Grans (Abs): 0 10*3/uL (ref 0.0–0.1)
Immature Granulocytes: 0 %
Lymphocytes Absolute: 1.9 10*3/uL (ref 0.7–3.1)
Lymphs: 33 %
MCH: 30.9 pg (ref 26.6–33.0)
MCHC: 33.6 g/dL (ref 31.5–35.7)
MCV: 92 fL (ref 79–97)
Monocytes Absolute: 0.4 10*3/uL (ref 0.1–0.9)
Monocytes: 7 %
Neutrophils Absolute: 3.2 10*3/uL (ref 1.4–7.0)
Neutrophils: 56 %
Platelets: 201 10*3/uL (ref 150–450)
RBC: 4.86 x10E6/uL (ref 4.14–5.80)
RDW: 12.5 % (ref 11.6–15.4)
WBC: 5.8 10*3/uL (ref 3.4–10.8)

## 2020-09-28 LAB — LIPID PANEL
Chol/HDL Ratio: 3.1 ratio (ref 0.0–5.0)
Cholesterol, Total: 144 mg/dL (ref 100–199)
HDL: 47 mg/dL (ref >39)
LDL Chol Calc (NIH): 81 mg/dL (ref 0–99)
Triglycerides: 81 mg/dL (ref 0–149)
VLDL Cholesterol Cal: 16 mg/dL (ref 5–40)

## 2020-09-28 NOTE — Progress Notes (Signed)
Hello Aiden,  Your lab result is normal and/or stable.Some minor variations that are not significant are commonly marked abnormal, but do not represent any medical problem for you.  Best regards, Aveon Colquhoun, M.D.

## 2020-10-05 ENCOUNTER — Other Ambulatory Visit: Payer: Self-pay | Admitting: Family Medicine

## 2020-10-05 DIAGNOSIS — K219 Gastro-esophageal reflux disease without esophagitis: Secondary | ICD-10-CM

## 2020-10-25 ENCOUNTER — Other Ambulatory Visit: Payer: Self-pay | Admitting: Family Medicine

## 2020-10-25 DIAGNOSIS — N400 Enlarged prostate without lower urinary tract symptoms: Secondary | ICD-10-CM

## 2020-10-25 DIAGNOSIS — E785 Hyperlipidemia, unspecified: Secondary | ICD-10-CM

## 2020-12-18 ENCOUNTER — Other Ambulatory Visit: Payer: Self-pay | Admitting: Family Medicine

## 2020-12-18 DIAGNOSIS — K219 Gastro-esophageal reflux disease without esophagitis: Secondary | ICD-10-CM

## 2021-01-01 ENCOUNTER — Other Ambulatory Visit: Payer: Self-pay

## 2021-01-01 ENCOUNTER — Ambulatory Visit (INDEPENDENT_AMBULATORY_CARE_PROVIDER_SITE_OTHER): Payer: Medicare Other | Admitting: Family Medicine

## 2021-01-01 ENCOUNTER — Encounter: Payer: Self-pay | Admitting: Family Medicine

## 2021-01-01 VITALS — BP 123/78 | HR 91 | Temp 97.6°F | Ht 69.0 in | Wt 211.0 lb

## 2021-01-01 DIAGNOSIS — I152 Hypertension secondary to endocrine disorders: Secondary | ICD-10-CM | POA: Diagnosis not present

## 2021-01-01 DIAGNOSIS — E1169 Type 2 diabetes mellitus with other specified complication: Secondary | ICD-10-CM | POA: Diagnosis not present

## 2021-01-01 DIAGNOSIS — E1140 Type 2 diabetes mellitus with diabetic neuropathy, unspecified: Secondary | ICD-10-CM | POA: Diagnosis not present

## 2021-01-01 DIAGNOSIS — Z23 Encounter for immunization: Secondary | ICD-10-CM | POA: Diagnosis not present

## 2021-01-01 DIAGNOSIS — E785 Hyperlipidemia, unspecified: Secondary | ICD-10-CM

## 2021-01-01 DIAGNOSIS — E1159 Type 2 diabetes mellitus with other circulatory complications: Secondary | ICD-10-CM

## 2021-01-01 LAB — BAYER DCA HB A1C WAIVED: HB A1C (BAYER DCA - WAIVED): 7.1 % — ABNORMAL HIGH (ref <7.0)

## 2021-01-01 MED ORDER — VALSARTAN-HYDROCHLOROTHIAZIDE 320-25 MG PO TABS: 1.0000 | ORAL_TABLET | Freq: Every day | ORAL | 2 refills | Status: DC

## 2021-01-01 NOTE — Progress Notes (Signed)
 Subjective:  Patient ID: Roger Palmer, male    DOB: 12/14/1950  Age: 70 y.o. MRN: [id number]  CC: Medical Management of Chronic Issues   HPI Roger Palmer presents forFollow-up of diabetes. Patient checks blood sugar at home.   140-160 fasting and not checking postprandial Patient denies symptoms such as polyuria, polydipsia, excessive hunger, nausea No significant hypoglycemic spells noted. Medications reviewed. Pt reports taking them regularly without complication/adverse reaction being reported today.  Neuropathy of foot is stable.    presents for  follow-up of hypertension. Patient has no history of headache chest pain or shortness of breath or recent cough. Patient also denies symptoms of TIA such as focal numbness or weakness. Patient denies side effects from medication. States taking it regularly.Readings from home show SBP in the 140s  BPH doing well. No nocturia. No daytime frequency.   History Roger Palmer has a past medical history of Allergy, Back pain, Bee sting allergy, BPH (benign prostatic hypertrophy), Diabetes mellitus without complication ([facility]), Gastritis, GERD (gastroesophageal reflux disease), Herniated disc, Hypercholesteremia, Hypertension, Nephrolithiasis, Obesity, PONV (postoperative nausea and vomiting), Reflux, and Vitamin D deficiency.   He has a past surgical history that includes Inguinal hernia repair; kidney stones; ORIF R elbow (07/12/2012); ORIF R wirst (07/12/2012); Open reduction internal fixation (orif) distal radial fracture (Right, 07/11/2012); ORIF radial fracture (Right, 07/11/2012); ORIF acetabular fracture (Right, 07/14/2012); and Fracture surgery.   His family history includes ADD / ADHD in his son; Anxiety disorder in his son; CAD in his paternal grandmother; Cancer in his maternal grandmother and sister; Diabetes in his mother, sister, and sister; Heart attack in his maternal grandfather; Heart attack (age of onset: 70) in his mother; Hypertension in his  father and mother; Nephrolithiasis in his brother; Stroke in his father.He reports that he has never smoked. He has never used smokeless tobacco. He reports current alcohol use. He reports that he does not use drugs.  Current Outpatient Medications on File Prior to Visit  Medication Sig Dispense Refill   atorvastatin (LIPITOR) 40 MG tablet TAKE ONE (1) TABLET EACH DAY 90 tablet 0   fenofibrate 160 MG tablet TAKE ONE (1) TABLET EACH DAY 90 tablet 2   finasteride (PROSCAR) 5 MG tablet Take 1 tablet (5 mg total) by mouth daily. 90 tablet 2   loratadine (CLARITIN) 10 MG tablet Take 1 tablet (10 mg total) by mouth as needed for allergies. 90 tablet 2   metFORMIN (GLUCOPHAGE-XR) 500 MG 24 hr tablet TAKE 1 TABLET EVERY MORNING WITH BREAKFAST 90 tablet 2   omeprazole (PRILOSEC) 20 MG capsule TAKE ONE (1) CAPSULE EACH DAY 90 capsule 0   tamsulosin (FLOMAX) 0.4 MG CAPS capsule TAKE ONE (1) CAPSULE EACH DAY 90 capsule 0   Vitamin D, Ergocalciferol, (DRISDOL) 1.25 MG (50000 UNIT) CAPS capsule Take 50,000 Units by mouth every 7 (seven) days.     No current facility-administered medications on file prior to visit.    ROS Review of Systems  Constitutional:  Negative for fever.  Respiratory:  Negative for shortness of breath.   Cardiovascular:  Negative for chest pain.  Musculoskeletal:  Negative for arthralgias.  Skin:  Negative for rash.   Objective:  BP 123/78   Pulse 91   Temp 97.6 F (36.4 C)   Ht 5' 9" (1.753 m)   Wt 211 lb (95.7 kg)   SpO2 98%   BMI 31.16 kg/m   BP Readings from Last 3 Encounters:  01/01/21 123/78  09/27/20 121/71  06/30/20 133/83      Wt Readings from Last 3 Encounters:  01/01/21 211 lb (95.7 kg)  09/27/20 208 lb 6.4 oz (94.5 kg)  06/30/20 210 lb (95.3 kg)     Physical Exam Vitals reviewed.  Constitutional:      Appearance: He is well-developed.  HENT:     Head: Normocephalic and atraumatic.     Right Ear: External ear normal.     Left Ear: External ear  normal.     Mouth/Throat:     Pharynx: No oropharyngeal exudate or posterior oropharyngeal erythema.  Eyes:     Pupils: Pupils are equal, round, and reactive to light.  Cardiovascular:     Rate and Rhythm: Normal rate and regular rhythm.     Heart sounds: No murmur heard. Pulmonary:     Effort: No respiratory distress.     Breath sounds: Normal breath sounds.  Musculoskeletal:     Cervical back: Normal range of motion and neck supple.  Neurological:     Mental Status: He is alert and oriented to person, place, and time.      Assessment & Plan:   Roger Palmer was seen today for medical management of chronic issues.  Diagnoses and all orders for this visit:  Type 2 diabetes mellitus with diabetic neuropathy, without long-term current use of insulin ([facility]) -     Bayer DCA Hb A1c Waived -     CBC with Differential/Platelet  Hyperlipidemia associated with type 2 diabetes mellitus ([facility]) -     Lipid panel  Hypertension associated with diabetes ([place]) -     CMP14+EGFR  Need for shingles vaccine -     Varicella-zoster vaccine IM ([facility])  Other orders -     valsartan-hydrochlorothiazide (DIOVAN HCT) 320-25 MG tablet; Take 1 tablet by mouth daily. For Blood Pressure     I have discontinued Roger Saxon L. Rylee [name]'s losartan-hydrochlorothiazide. I am also having him start on valsartan-hydrochlorothiazide. Additionally, I am having him maintain his Vitamin D (Ergocalciferol), fenofibrate, finasteride, loratadine, metFORMIN, tamsulosin, atorvastatin, and omeprazole.  Meds ordered this encounter  Medications   valsartan-hydrochlorothiazide (DIOVAN HCT) 320-25 MG tablet    Sig: Take 1 tablet by mouth daily. For Blood Pressure    Dispense:  30 tablet    Refill:  2     Follow-up: Return in about 3 months (around 04/03/2021) for diabetes, cholesterol, hypertension.  Claretta Fraise, M.D.

## 2021-01-03 LAB — CBC WITH DIFFERENTIAL/PLATELET
Basophils Absolute: 0.1 10*3/uL (ref 0.0–0.2)
Basos: 1 %
EOS (ABSOLUTE): 0.2 10*3/uL (ref 0.0–0.4)
Eos: 4 %
Hematocrit: 45.8 % (ref 37.5–51.0)
Hemoglobin: 15.3 g/dL (ref 13.0–17.7)
Immature Grans (Abs): 0 10*3/uL (ref 0.0–0.1)
Immature Granulocytes: 0 %
Lymphocytes Absolute: 1.8 10*3/uL (ref 0.7–3.1)
Lymphs: 30 %
MCH: 30.8 pg (ref 26.6–33.0)
MCHC: 33.4 g/dL (ref 31.5–35.7)
MCV: 92 fL (ref 79–97)
Monocytes Absolute: 0.5 10*3/uL (ref 0.1–0.9)
Monocytes: 9 %
Neutrophils Absolute: 3.3 10*3/uL (ref 1.4–7.0)
Neutrophils: 56 %
Platelets: 206 10*3/uL (ref 150–450)
RBC: 4.96 x10E6/uL (ref 4.14–5.80)
RDW: 12.6 % (ref 11.6–15.4)
WBC: 5.9 10*3/uL (ref 3.4–10.8)

## 2021-01-03 LAB — LIPID PANEL
Chol/HDL Ratio: 3.2 ratio (ref 0.0–5.0)
Cholesterol, Total: 140 mg/dL (ref 100–199)
HDL: 44 mg/dL (ref >39)
LDL Chol Calc (NIH): 81 mg/dL (ref 0–99)
Triglycerides: 76 mg/dL (ref 0–149)
VLDL Cholesterol Cal: 15 mg/dL (ref 5–40)

## 2021-01-03 LAB — CMP14+EGFR
ALT: 19 IU/L (ref 0–44)
AST: 19 IU/L (ref 0–40)
Albumin/Globulin Ratio: 1.9 (ref 1.2–2.2)
Albumin: 4.3 g/dL (ref 3.7–4.7)
Alkaline Phosphatase: 58 IU/L (ref 44–121)
BUN/Creatinine Ratio: 12 (ref 10–24)
BUN: 15 mg/dL (ref 8–27)
Bilirubin Total: 0.4 mg/dL (ref 0.0–1.2)
CO2: 22 mmol/L (ref 20–29)
Calcium: 9.9 mg/dL (ref 8.6–10.2)
Chloride: 103 mmol/L (ref 96–106)
Creatinine, Ser: 1.21 mg/dL (ref 0.76–1.27)
Globulin, Total: 2.3 g/dL (ref 1.5–4.5)
Glucose: 157 mg/dL — ABNORMAL HIGH (ref 65–99)
Potassium: 4.3 mmol/L (ref 3.5–5.2)
Sodium: 141 mmol/L (ref 134–144)
Total Protein: 6.6 g/dL (ref 6.0–8.5)
eGFR: 64 mL/min/{1.73_m2} (ref >59)

## 2021-01-04 ENCOUNTER — Ambulatory Visit (INDEPENDENT_AMBULATORY_CARE_PROVIDER_SITE_OTHER): Payer: Medicare Other

## 2021-01-04 VITALS — Ht 69.0 in | Wt 211.0 lb

## 2021-01-04 DIAGNOSIS — Z0000 Encounter for general adult medical examination without abnormal findings: Secondary | ICD-10-CM | POA: Diagnosis not present

## 2021-01-04 NOTE — Patient Instructions (Signed)
[name] , Thank you for taking time to come for your Medicare Wellness Visit. I appreciate your ongoing commitment to your health goals. Please review the following plan we discussed and let me know if I can assist you in the future.   Screening recommendations/referrals: Colonoscopy: Done 06/05/2016 - Repeat in 10 years  Recommended yearly ophthalmology/optometry visit for glaucoma screening and checkup Recommended yearly dental visit for hygiene and checkup  Vaccinations: Influenza vaccine: Done 02/29/2020 - Repeat annually Pneumococcal vaccine: Done 04/01/2013 & 05/24/2019 Tdap vaccine: Done 2013 - Repeat in 10 years Shingles vaccine: Done 01/01/2021 - get second dose in 2-6 months   Covid-19: Done 07/08/19, 08/06/19, & 04/26/2020 - due for second booster  Advanced directives: Please bring a copy of your health care power of attorney and living will to the office to be added to your chart at your convenience.   Conditions/risks identified: Aim for 30 minutes of exercise or brisk walking each day, drink 6-8 glasses of water and eat lots of fruits and vegetables.   Next appointment: Follow up in one year for your annual wellness visit.   Preventive Care 1 Years and Older, Male  Preventive care refers to lifestyle choices and visits with your health care provider that can promote health and wellness. What does preventive care include? A yearly physical exam. This is also called an annual well check. Dental exams once or twice a year. Routine eye exams. Ask your health care provider how often you should have your eyes checked. Personal lifestyle choices, including: Daily care of your teeth and gums. Regular physical activity. Eating a healthy diet. Avoiding tobacco and drug use. Limiting alcohol use. Practicing safe sex. Taking low doses of aspirin every day. Taking vitamin and mineral supplements as recommended by your health care provider. What happens during an annual well  check? The services and screenings done by your health care provider during your annual well check will depend on your age, overall health, lifestyle risk factors, and family history of disease. Counseling  Your health care provider may ask you questions about your: Alcohol use. Tobacco use. Drug use. Emotional well-being. Home and relationship well-being. Sexual activity. Eating habits. History of falls. Memory and ability to understand (cognition). Work and work Statistician. Screening  You may have the following tests or measurements: Height, weight, and BMI. Blood pressure. Lipid and cholesterol levels. These may be checked every 5 years, or more frequently if you are over 32 years old. Skin check. Lung cancer screening. You may have this screening every year starting at age 47 if you have a 30-pack-year history of smoking and currently smoke or have quit within the past 15 years. Fecal occult blood test (FOBT) of the stool. You may have this test every year starting at age 58. Flexible sigmoidoscopy or colonoscopy. You may have a sigmoidoscopy every 5 years or a colonoscopy every 10 years starting at age 49. Prostate cancer screening. Recommendations will vary depending on your family history and other risks. Hepatitis C blood test. Hepatitis B blood test. Sexually transmitted disease (STD) testing. Diabetes screening. This is done by checking your blood sugar (glucose) after you have not eaten for a while (fasting). You may have this done every 1-3 years. Abdominal aortic aneurysm (AAA) screening. You may need this if you are a current or former smoker. Osteoporosis. You may be screened starting at age 43 if you are at high risk. Talk with your health care provider about your test results, treatment options, and  if necessary, the need for more tests. Vaccines  Your health care provider may recommend certain vaccines, such as: Influenza vaccine. This is recommended every  year. Tetanus, diphtheria, and acellular pertussis (Tdap, Td) vaccine. You may need a Td booster every 10 years. Zoster vaccine. You may need this after age 15. Pneumococcal 13-valent conjugate (PCV13) vaccine. One dose is recommended after age 76. Pneumococcal polysaccharide (PPSV23) vaccine. One dose is recommended after age 19. Talk to your health care provider about which screenings and vaccines you need and how often you need them. This information is not intended to replace advice given to you by your health care provider. Make sure you discuss any questions you have with your health care provider. Document Released: 05/26/2015 Document Revised: 01/17/2016 Document Reviewed: 02/28/2015 Elsevier Interactive Patient Education  2017 [place] Prevention in the Home Falls can cause injuries. They can happen to people of all ages. There are many things you can do to make your home safe and to help prevent falls. What can I do on the outside of my home? Regularly fix the edges of walkways and driveways and fix any cracks. Remove anything that might make you trip as you walk through a door, such as a raised step or threshold. Trim any bushes or trees on the path to your home. Use bright outdoor lighting. Clear any walking paths of anything that might make someone trip, such as rocks or tools. Regularly check to see if handrails are loose or broken. Make sure that both sides of any steps have handrails. Any raised decks and porches should have guardrails on the edges. Have any leaves, snow, or ice cleared regularly. Use sand or salt on walking paths during winter. Clean up any spills in your garage right away. This includes oil or grease spills. What can I do in the bathroom? Use night lights. Install grab bars by the toilet and in the tub and shower. Do not use towel bars as grab bars. Use non-skid mats or decals in the tub or shower. If you need to sit down in the shower, use a  plastic, non-slip stool. Keep the floor dry. Clean up any water that spills on the floor as soon as it happens. Remove soap buildup in the tub or shower regularly. Attach bath mats securely with double-sided non-slip rug tape. Do not have throw rugs and other things on the floor that can make you trip. What can I do in the bedroom? Use night lights. Make sure that you have a light by your bed that is easy to reach. Do not use any sheets or blankets that are too big for your bed. They should not hang down onto the floor. Have a firm chair that has side arms. You can use this for support while you get dressed. Do not have throw rugs and other things on the floor that can make you trip. What can I do in the kitchen? Clean up any spills right away. Avoid walking on wet floors. Keep items that you use a lot in easy-to-reach places. If you need to reach something above you, use a strong step stool that has a grab bar. Keep electrical cords out of the way. Do not use floor polish or wax that makes floors slippery. If you must use wax, use non-skid floor wax. Do not have throw rugs and other things on the floor that can make you trip. What can I do with my stairs? Do not leave any  items on the stairs. Make sure that there are handrails on both sides of the stairs and use them. Fix handrails that are broken or loose. Make sure that handrails are as long as the stairways. Check any carpeting to make sure that it is firmly attached to the stairs. Fix any carpet that is loose or worn. Avoid having throw rugs at the top or bottom of the stairs. If you do have throw rugs, attach them to the floor with carpet tape. Make sure that you have a light switch at the top of the stairs and the bottom of the stairs. If you do not have them, ask someone to add them for you. What else can I do to help prevent falls? Wear shoes that: Do not have high heels. Have rubber bottoms. Are comfortable and fit you  well. Are closed at the toe. Do not wear sandals. If you use a stepladder: Make sure that it is fully opened. Do not climb a closed stepladder. Make sure that both sides of the stepladder are locked into place. Ask someone to hold it for you, if possible. Clearly mark and make sure that you can see: Any grab bars or handrails. First and last steps. Where the edge of each step is. Use tools that help you move around (mobility aids) if they are needed. These include: Canes. Walkers. Scooters. Crutches. Turn on the lights when you go into a dark area. Replace any light bulbs as soon as they burn out. Set up your furniture so you have a clear path. Avoid moving your furniture around. If any of your floors are uneven, fix them. If there are any pets around you, be aware of where they are. Review your medicines with your doctor. Some medicines can make you feel dizzy. This can increase your chance of falling. Ask your doctor what other things that you can do to help prevent falls. This information is not intended to replace advice given to you by your health care provider. Make sure you discuss any questions you have with your health care provider. Document Released: 02/23/2009 Document Revised: 10/05/2015 Document Reviewed: 06/03/2014 Elsevier Interactive Patient Education  2017 [employer].

## 2021-01-04 NOTE — Progress Notes (Signed)
Subjective:   Roger Palmer is a 70 y.o. male who presents for Medicare Annual/Subsequent preventive examination.  Virtual Visit via Telephone Note  I connected with  Roger Palmer on 01/04/21 at  9:00 AM EDT by telephone and verified that I am speaking with the correct person using two identifiers.  Location: Patient: Home Provider: [facility] Persons participating in the virtual visit: patient/Nurse Health Advisor   I discussed the limitations, risks, security and privacy concerns of performing an evaluation and management service by telephone and the availability of in person appointments. The patient expressed understanding and agreed to proceed.  Interactive audio and video telecommunications were attempted between this nurse and patient, however failed, due to patient having technical difficulties OR patient did not have access to video capability.  We continued and completed visit with audio only.  Some vital signs may be absent or patient reported.   Kimberlea Schlag E Nila Winker, LPN   Review of Systems     Cardiac Risk Factors include: advanced age (>51mn, >>54women);diabetes mellitus;male gender;obesity (BMI >30kg/m2);sedentary lifestyle;dyslipidemia;hypertension     Objective:    Today's Vitals   01/04/21 0903  Weight: 211 lb (95.7 kg)  Height: '5\' 9"'$  (1.753 m)   Body mass index is 31.16 kg/m.  Advanced Directives 01/04/2021 06/11/2018 06/05/2016 07/23/2012 07/14/2012 07/12/2012  Does Patient Have a Medical Advance Directive? Yes Yes Yes Patient does not have advance directive;Patient would like information Patient does not have advance directive Patient does not have advance directive  Type of Advance Directive HOasisLiving will Living will;Healthcare Power of Attorney - - - -  Copy of H[place]in Chart? No - copy requested No - copy requested - - - -  Would patient like information on creating a medical advance directive? - - - Advance  directive packet given - -  Pre-existing out of facility DNR order (yellow form or pink MOST form) - - - No - -    Current Medications (verified) Outpatient Encounter Medications as of 01/04/2021  Medication Sig   atorvastatin (LIPITOR) 40 MG tablet TAKE ONE (1) TABLET EACH DAY   fenofibrate 160 MG tablet TAKE ONE (1) TABLET EACH DAY   finasteride (PROSCAR) 5 MG tablet Take 1 tablet (5 mg total) by mouth daily.   loratadine (CLARITIN) 10 MG tablet Take 1 tablet (10 mg total) by mouth as needed for allergies.   metFORMIN (GLUCOPHAGE-XR) 500 MG 24 hr tablet TAKE 1 TABLET EVERY MORNING WITH BREAKFAST   omeprazole (PRILOSEC) 20 MG capsule TAKE ONE (1) CAPSULE EACH DAY   tamsulosin (FLOMAX) 0.4 MG CAPS capsule TAKE ONE (1) CAPSULE EACH DAY   valsartan-hydrochlorothiazide (DIOVAN HCT) 320-25 MG tablet Take 1 tablet by mouth daily. For Blood Pressure   Vitamin D, Ergocalciferol, (DRISDOL) 1.25 MG (50000 UNIT) CAPS capsule Take 50,000 Units by mouth every 7 (seven) days.   No facility-administered encounter medications on file as of 01/04/2021.    Allergies (verified) Patient has no known allergies.   History: Past Medical History:  Diagnosis Date   Allergy    seasonal    Back pain    Bee sting allergy    BPH (benign prostatic hypertrophy)    Diabetes mellitus without complication (HCC)    Gastritis    GERD (gastroesophageal reflux disease)    Herniated disc    c7-c7, c5-6, c3-4, c4-5   Hypercholesteremia    Hypertension    Nephrolithiasis    Obesity    PONV (postoperative  nausea and vomiting)    Reflux    Vitamin D deficiency    Past Surgical History:  Procedure Laterality Date   FRACTURE SURGERY     right hip    INGUINAL HERNIA REPAIR     right   kidney stones     x2 Dr. Rosana Hoes    OPEN REDUCTION INTERNAL FIXATION (ORIF) DISTAL RADIAL FRACTURE Right 07/11/2012   Procedure: OPEN REDUCTION INTERNAL FIXATION (ORIF) DISTAL RADIAL FRACTURE;  Surgeon: Schuyler Amor, MD;   Location: [place];  Service: Orthopedics;  Laterality: Right;   ORIF ACETABULAR FRACTURE Right 07/14/2012   Procedure: OPEN REDUCTION INTERNAL FIXATION (ORIF) ACETABULAR FRACTURE;  Surgeon: Rozanna Box, MD;  Location: [place];  Service: Orthopedics;  Laterality: Right;   ORIF R elbow  07/12/2012   ORIF R wirst  07/12/2012   ORIF RADIAL FRACTURE Right 07/11/2012   Procedure: OPEN REDUCTION INTERNAL FIXATION (ORIF) RADIAL head FRACTURE;  Surgeon: Schuyler Amor, MD;  Location: [place];  Service: Orthopedics;  Laterality: Right;   Family History  Problem Relation Age of Onset   Heart attack Mother 20   Diabetes Mother        NIDDM   Hypertension Mother    Hypertension Father    Stroke Father    Nephrolithiasis Brother    Cancer Sister        unsure - ? cervical    Diabetes Sister    Diabetes Sister    Anxiety disorder Son        depression    ADD / ADHD Son    Cancer Maternal Grandmother        unknown   Heart attack Maternal Grandfather    CAD Paternal Grandmother    Social History   Socioeconomic History   Marital status: Married    Spouse name: Kenney Houseman   Number of children: 2   Years of education: Not on file   Highest education level: Not on file  Occupational History   Occupation: retired    Comment: [employer]  Tobacco Use   Smoking status: Never   Smokeless tobacco: Never  Vaping Use   Vaping Use: Never used  Substance and Sexual Activity   Alcohol use: Yes    Comment: social   Drug use: No   Sexual activity: Yes  Other Topics Concern   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    Works in garden, owns 5 acres, [place] yards, very physically active   Lives home with wife - daughter and 2 granddaughters live with him   Social Determinants of Radio broadcast assistant Strain: Low Risk    Difficulty of Paying Living Expenses: Not hard at all  Food Insecurity: No Food Insecurity   Worried About Sales executive in the Last Year: Never true   Arboriculturist in the Last Year: Never true  Transportation Needs: No Data processing manager (Medical): No   Lack of Transportation (Non-Medical): No  Physical Activity: Sufficiently Active   Days of Exercise per Week: 6 days   Minutes of Exercise per Session: 60 min  Stress: No Stress Concern Present   Feeling of Stress : Not at all  Social Connections: Socially Integrated   Frequency of Communication with Friends and Family: More than three times a week   Frequency of Social Gatherings with Friends and Family: More than three times  a week   Attends Religious Services: More than 4 times per year   Active Member of Clubs or Organizations: Yes   Attends Music therapist: More than 4 times per year   Marital Status: Married    Tobacco Counseling Counseling given: Not Answered   Clinical Intake:  Pre-visit preparation completed: Yes  Pain : No/denies pain     BMI - recorded: 31.16 Nutritional Status: BMI > 30  Obese Nutritional Risks: None Diabetes: Yes CBG done?: No Did pt. bring in CBG monitor from home?: No  How often do you need to have someone help you when you read instructions, pamphlets, or other written materials from your doctor or pharmacy?: 1 - Never  Diabetic? Nutrition Risk Assessment:  Has the patient had any N/V/D within the last 2 months?  No  Does the patient have any non-healing wounds?  No  Has the patient had any unintentional weight loss or weight gain?  No   Diabetes:  Is the patient diabetic?  Yes  If diabetic, was a CBG obtained today?  No  Did the patient bring in their glucometer from home?  No  How often do you monitor your CBG's? 3 times per week.   Financial Strains and Diabetes Management:  Are you having any financial strains with the device, your supplies or your medication? No .  Does the patient want to be seen by Chronic Care Management for management of  their diabetes?  No  Would the patient like to be referred to a Nutritionist or for Diabetic Management?  No   Diabetic Exams:  Diabetic Eye Exam: Completed 06/02/2020.  Diabetic Foot Exam: Completed 09/27/2020. Pt has been advised about the importance in completing this exam. Pt is scheduled for diabetic foot exam on next year.    Interpreter Needed?: No  Information entered by :: Yania Bogie, LPN   Activities of Daily Living In your present state of health, do you have any difficulty performing the following activities: 01/04/2021  Hearing? Y  Comment wears hearing aids  Vision? N  Difficulty concentrating or making decisions? N  Walking or climbing stairs? N  Dressing or bathing? N  Doing errands, shopping? N  Preparing Food and eating ? N  Using the Toilet? N  In the past six months, have you accidently leaked urine? N  Do you have problems with loss of bowel control? N  Managing your Medications? N  Managing your Finances? N  Housekeeping or managing your Housekeeping? N  Some recent data might be hidden    Patient Care Team: Claretta Fraise, MD as PCP - General (Family Medicine) Harlen Labs, MD as Referring Physician (Optometry) Myrlene Broker, MD as Attending Physician (Urology) Pyrtle, Lajuan Lines, MD as Consulting Physician (Gastroenterology)  Indicate any recent Medical Services you may have received from other than [facility] providers in the past year (date may be approximate).     Assessment:   This is a routine wellness examination for Roger Palmer.  Hearing/Vision screen Hearing Screening - Comments:: Moderate hearing difficulties - wears hearing aids - from [facility] Vision Screening - Comments:: Wears eyeglasses - up to date with annual eye exams with Dr Marin Comment in [place]  Dietary issues and exercise activities discussed: Current Exercise Habits: Home exercise routine, Type of exercise: walking;Other - see comments (yard work, gardening), Time (Minutes): 45,  Frequency (Times/Week): 6, Weekly Exercise (Minutes/Week): 270, Intensity: Moderate, Exercise limited by: None identified   Goals Addressed  This Visit's Progress    Increase physical activity   On track      Depression Screen PHQ 2/9 Scores 01/04/2021 01/01/2021 09/27/2020 06/30/2020 11/29/2019 08/20/2019 06/24/2019  PHQ - 2 Score 0 0 0 0 0 0 0    Fall Risk Fall Risk  01/04/2021 01/01/2021 09/27/2020 06/30/2020 11/29/2019  Falls in the past year? 0 0 0 0 0  Number falls in past yr: 0 - - - -  Injury with Fall? 0 - - - -  Risk for fall due to : No Fall Risks - - - -  Follow up Falls prevention discussed - - Falls evaluation completed Falls evaluation completed    [place]:  Any stairs in or around the home? Yes  If so, are there any without handrails? No  Home free of loose throw rugs in walkways, pet beds, electrical cords, etc? Yes  Adequate lighting in your home to reduce risk of falls? Yes   ASSISTIVE DEVICES UTILIZED TO PREVENT FALLS:  Life alert? No  Use of a cane, walker or w/c? No  Grab bars in the bathroom? Yes  Shower chair or bench in shower? No  Elevated toilet seat or a handicapped toilet? Yes   TIMED UP AND GO:  Was the test performed? No . Telephonic visit  Cognitive Function: Normal cognitive status assessed by direct observation by this Nurse Health Advisor. No abnormalities found.    MMSE - Mini Mental State Exam 06/11/2018  Orientation to time 5  Orientation to Place 5  Registration 3  Attention/ Calculation 5  Recall 3  Language- name 2 objects 2  Language- repeat 1  Language- follow 3 step command 3  Language- read & follow direction 1  Write a sentence 1  Copy design 1  Total score 30        Immunizations Immunization History  Administered Date(s) Administered   Fluad Quad(high Dose 65+) 05/24/2019, 02/29/2020   Influenza Split 07/25/2012   Influenza, High Dose Seasonal PF 04/01/2016,  04/29/2017, 02/04/2018   Influenza,inj,Quad PF,6+ Mos 03/11/2013, 02/25/2014, 06/28/2015   Moderna SARS-COV2 Booster Vaccination 04/26/2020   Moderna Sars-Covid-2 Vaccination 07/08/2019, 08/06/2019   Pneumococcal Conjugate-13 04/01/2013   Pneumococcal Polysaccharide-23 07/25/2012, 05/24/2019   Zoster Recombinat (Shingrix) 01/01/2021   Zoster, Live 05/12/2012    TDAP status: Up to date  Flu Vaccine status: Up to date  Pneumococcal vaccine status: Up to date  Covid-19 vaccine status: Completed vaccines  Qualifies for Shingles Vaccine? Yes   Zostavax completed Yes   Shingrix Completed?: No.    Education has been provided regarding the importance of this vaccine. Patient has been advised to call insurance company to determine out of pocket expense if they have not yet received this vaccine. Advised may also receive vaccine at local pharmacy or Health Dept. Verbalized acceptance and understanding.  Screening Tests Health Maintenance  Topic Date Due   COVID-19 Vaccine (4 - Booster for Moderna series) 07/25/2020   INFLUENZA VACCINE  12/11/2020   Zoster Vaccines- Shingrix (2 of 2) 02/26/2021   TETANUS/TDAP  05/13/2021   OPHTHALMOLOGY EXAM  06/02/2021   HEMOGLOBIN A1C  07/04/2021   FOOT EXAM  09/27/2021   COLONOSCOPY (Pts 45-54yr Insurance coverage will need to be confirmed)  06/05/2026   Hepatitis C Screening  Completed   PNA vac Low Risk Adult  Completed   HPV VACCINES  Aged Out   COLON CANCER SCREENING ANNUAL FOBT  Discontinued    Health Maintenance  Health Maintenance Due  Topic Date Due   COVID-19 Vaccine (4 - Booster for Moderna series) 07/25/2020   INFLUENZA VACCINE  12/11/2020    Colorectal cancer screening: Type of screening: Colonoscopy. Completed 06/05/2016. Repeat every 10 years  Lung Cancer Screening: (Low Dose CT Chest recommended if Age 33-80 years, 30 pack-year currently smoking OR have quit w/in 15years.) does not qualify.   Additional  Screening:  Hepatitis C Screening: does qualify; Completed 02/29/2020  Vision Screening: Recommended annual ophthalmology exams for early detection of glaucoma and other disorders of the eye. Is the patient up to date with their annual eye exam?  Yes  Who is the provider or what is the name of the office in which the patient attends annual eye exams? [facility] If pt is not established with a provider, would they like to be referred to a provider to establish care? No .   Dental Screening: Recommended annual dental exams for proper oral hygiene  Community Resource Referral / Chronic Care Management: CRR required this visit?  No   CCM required this visit?  No      Plan:     I have personally reviewed and noted the following in the patient's chart:   Medical and social history Use of alcohol, tobacco or illicit drugs  Current medications and supplements including opioid prescriptions. Patient is not currently taking opioid prescriptions. Functional ability and status Nutritional status Physical activity Advanced directives List of other physicians Hospitalizations, surgeries, and ER visits in previous 12 months Vitals Screenings to include cognitive, depression, and falls Referrals and appointments  In addition, I have reviewed and discussed with patient certain preventive protocols, quality metrics, and best practice recommendations. A written personalized care plan for preventive services as well as general preventive health recommendations were provided to patient.     Sandrea Hammond, LPN   [id number]   Nurse Notes: None

## 2021-01-05 NOTE — Progress Notes (Signed)
Hello [place],  Your lab result is normal and/or stable.Some minor variations that are not significant are commonly marked abnormal, but do not represent any medical problem for you.  Best regards, Zellie Jenning, M.D.

## 2021-01-08 DIAGNOSIS — H43813 Vitreous degeneration, bilateral: Secondary | ICD-10-CM | POA: Diagnosis not present

## 2021-01-08 DIAGNOSIS — H33011 Retinal detachment with single break, right eye: Secondary | ICD-10-CM | POA: Diagnosis not present

## 2021-01-08 DIAGNOSIS — H40033 Anatomical narrow angle, bilateral: Secondary | ICD-10-CM | POA: Diagnosis not present

## 2021-01-08 DIAGNOSIS — Q141 Congenital malformation of retina: Secondary | ICD-10-CM | POA: Diagnosis not present

## 2021-01-08 DIAGNOSIS — H35033 Hypertensive retinopathy, bilateral: Secondary | ICD-10-CM | POA: Diagnosis not present

## 2021-01-08 DIAGNOSIS — H43393 Other vitreous opacities, bilateral: Secondary | ICD-10-CM | POA: Diagnosis not present

## 2021-01-09 DIAGNOSIS — H33021 Retinal detachment with multiple breaks, right eye: Secondary | ICD-10-CM | POA: Diagnosis not present

## 2021-01-09 DIAGNOSIS — H3321 Serous retinal detachment, right eye: Secondary | ICD-10-CM | POA: Diagnosis not present

## 2021-01-17 DIAGNOSIS — H33011 Retinal detachment with single break, right eye: Secondary | ICD-10-CM | POA: Diagnosis not present

## 2021-01-17 DIAGNOSIS — H43813 Vitreous degeneration, bilateral: Secondary | ICD-10-CM | POA: Diagnosis not present

## 2021-01-23 ENCOUNTER — Other Ambulatory Visit: Payer: Self-pay | Admitting: Family Medicine

## 2021-01-23 DIAGNOSIS — E785 Hyperlipidemia, unspecified: Secondary | ICD-10-CM

## 2021-01-23 DIAGNOSIS — N400 Enlarged prostate without lower urinary tract symptoms: Secondary | ICD-10-CM

## 2021-01-23 DIAGNOSIS — E1169 Type 2 diabetes mellitus with other specified complication: Secondary | ICD-10-CM

## 2021-02-07 ENCOUNTER — Encounter: Payer: Self-pay | Admitting: Family Medicine

## 2021-02-07 DIAGNOSIS — H35371 Puckering of macula, right eye: Secondary | ICD-10-CM | POA: Diagnosis not present

## 2021-02-07 DIAGNOSIS — H43812 Vitreous degeneration, left eye: Secondary | ICD-10-CM | POA: Diagnosis not present

## 2021-02-07 LAB — HM DIABETES EYE EXAM

## 2021-02-20 ENCOUNTER — Other Ambulatory Visit: Payer: Self-pay | Admitting: Family Medicine

## 2021-03-20 ENCOUNTER — Other Ambulatory Visit: Payer: Self-pay | Admitting: Family Medicine

## 2021-03-20 DIAGNOSIS — K219 Gastro-esophageal reflux disease without esophagitis: Secondary | ICD-10-CM

## 2021-03-22 DIAGNOSIS — H33011 Retinal detachment with single break, right eye: Secondary | ICD-10-CM | POA: Diagnosis not present

## 2021-03-22 DIAGNOSIS — H35371 Puckering of macula, right eye: Secondary | ICD-10-CM | POA: Diagnosis not present

## 2021-04-03 ENCOUNTER — Other Ambulatory Visit: Payer: Self-pay

## 2021-04-03 ENCOUNTER — Encounter: Payer: Self-pay | Admitting: Family Medicine

## 2021-04-03 ENCOUNTER — Ambulatory Visit (INDEPENDENT_AMBULATORY_CARE_PROVIDER_SITE_OTHER): Payer: Medicare Other | Admitting: Family Medicine

## 2021-04-03 VITALS — BP 131/76 | HR 100 | Temp 96.7°F | Ht 69.0 in | Wt 216.2 lb

## 2021-04-03 DIAGNOSIS — E1169 Type 2 diabetes mellitus with other specified complication: Secondary | ICD-10-CM | POA: Diagnosis not present

## 2021-04-03 DIAGNOSIS — Z125 Encounter for screening for malignant neoplasm of prostate: Secondary | ICD-10-CM

## 2021-04-03 DIAGNOSIS — I152 Hypertension secondary to endocrine disorders: Secondary | ICD-10-CM | POA: Diagnosis not present

## 2021-04-03 DIAGNOSIS — E1159 Type 2 diabetes mellitus with other circulatory complications: Secondary | ICD-10-CM

## 2021-04-03 DIAGNOSIS — H3321 Serous retinal detachment, right eye: Secondary | ICD-10-CM

## 2021-04-03 DIAGNOSIS — E1140 Type 2 diabetes mellitus with diabetic neuropathy, unspecified: Secondary | ICD-10-CM

## 2021-04-03 DIAGNOSIS — Z23 Encounter for immunization: Secondary | ICD-10-CM | POA: Diagnosis not present

## 2021-04-03 DIAGNOSIS — J310 Chronic rhinitis: Secondary | ICD-10-CM

## 2021-04-03 DIAGNOSIS — E785 Hyperlipidemia, unspecified: Secondary | ICD-10-CM

## 2021-04-03 DIAGNOSIS — K219 Gastro-esophageal reflux disease without esophagitis: Secondary | ICD-10-CM

## 2021-04-03 DIAGNOSIS — N400 Enlarged prostate without lower urinary tract symptoms: Secondary | ICD-10-CM

## 2021-04-03 LAB — BAYER DCA HB A1C WAIVED: HB A1C (BAYER DCA - WAIVED): 8.4 % — ABNORMAL HIGH (ref 4.8–5.6)

## 2021-04-03 MED ORDER — CANAGLIFLOZIN 300 MG PO TABS: 300.0000 mg | ORAL_TABLET | Freq: Every day | ORAL | 5 refills | Status: DC

## 2021-04-03 MED ORDER — ATORVASTATIN CALCIUM 40 MG PO TABS: ORAL_TABLET | ORAL | 2 refills | Status: DC

## 2021-04-03 MED ORDER — OMEPRAZOLE 20 MG PO CPDR: DELAYED_RELEASE_CAPSULE | ORAL | 2 refills | Status: DC

## 2021-04-03 MED ORDER — METFORMIN HCL ER 500 MG PO TB24: ORAL_TABLET | ORAL | 3 refills | Status: DC

## 2021-04-03 MED ORDER — TAMSULOSIN HCL 0.4 MG PO CAPS: ORAL_CAPSULE | ORAL | 2 refills | Status: DC

## 2021-04-03 MED ORDER — FINASTERIDE 5 MG PO TABS: ORAL_TABLET | ORAL | 2 refills | Status: DC

## 2021-04-03 MED ORDER — VALSARTAN-HYDROCHLOROTHIAZIDE 320-25 MG PO TABS: 1.0000 | ORAL_TABLET | Freq: Every day | ORAL | 2 refills | Status: DC

## 2021-04-03 MED ORDER — FENOFIBRATE 160 MG PO TABS: ORAL_TABLET | ORAL | 2 refills | Status: DC

## 2021-04-03 MED ORDER — LORATADINE 10 MG PO TABS: 10.0000 mg | ORAL_TABLET | ORAL | 3 refills | Status: DC | PRN

## 2021-04-03 NOTE — Addendum Note (Signed)
Addended by: Baldomero Lamy B on: 04/03/2021 11:24 AM   Modules accepted: Orders

## 2021-04-03 NOTE — Progress Notes (Signed)
Subjective:  Patient ID: Roger Palmer,  male    DOB: 1950-12-03  Age: 70 y.o.    CC: Medical Management of Chronic Issues   HPI Roger Palmer presents for  follow-up of hypertension. Patient has no history of headache chest pain or shortness of breath or recent cough. Patient also denies symptoms of TIA such as numbness weakness lateralizing. Patient denies side effects from medication. States taking it regularly.  Patient also  in for follow-up of elevated cholesterol. Doing well without complaints on current medication. Denies side effects  including myalgia and arthralgia and nausea. Also in today for liver function testing. Currently no chest pain, shortness of breath or other cardiovascular related symptoms noted.  Follow-up of diabetes. Patient does check blood sugar at home. Readings run between 170-200 fasting.Over 200 a couple of times Patient denies symptoms such as excessive hunger or urinary frequency, excessive hunger, nausea No significant hypoglycemic spells noted. Medications reviewed. Pt reports taking them regularly. Pt. denies complication/adverse reaction today.    History Roger Palmer has a past medical history of Allergy, Back pain, Bee sting allergy, BPH (benign prostatic hypertrophy), Diabetes mellitus without complication ([place]), Gastritis, GERD (gastroesophageal reflux disease), Herniated disc, Hypercholesteremia, Hypertension, Nephrolithiasis, Obesity, PONV (postoperative nausea and vomiting), Reflux, and Vitamin D deficiency.   Roger Palmer has a past surgical history that includes Inguinal hernia repair; kidney stones; ORIF R elbow (07/12/2012); ORIF R wirst (07/12/2012); Open reduction internal fixation (orif) distal radial fracture (Right, 07/11/2012); ORIF radial fracture (Right, 07/11/2012); ORIF acetabular fracture (Right, 07/14/2012); Fracture surgery; and Eye surgery (Right).   His family history includes ADD / ADHD in his son; Anxiety disorder in his son; CAD in  his paternal grandmother; Cancer in his maternal grandmother and sister; Diabetes in his mother, sister, and sister; Heart attack in his maternal grandfather; Heart attack (age of onset: 67) in his mother; Hypertension in his father and mother; Nephrolithiasis in his brother; Stroke in his father.Roger Palmer reports that Roger Palmer has never smoked. Roger Palmer has never used smokeless tobacco. Roger Palmer reports current alcohol use. Roger Palmer reports that Roger Palmer does not use drugs.  Current Outpatient Medications on File Prior to Visit  Medication Sig Dispense Refill   Vitamin D, Ergocalciferol, (DRISDOL) 1.25 MG (50000 UNIT) CAPS capsule Take 50,000 Units by mouth every 7 (seven) days.     No current facility-administered medications on file prior to visit.    ROS Review of Systems  Constitutional:  Negative for fever.  Eyes:  Positive for visual disturbance (rcent surgery for detached retina. Still has oil bubble).  Respiratory:  Negative for shortness of breath.   Cardiovascular:  Negative for chest pain.  Musculoskeletal:  Negative for arthralgias.  Skin:  Negative for rash.   Objective:  BP 131/76   Pulse 100   Temp (!) 96.7 F (35.9 C)   Ht $R'5\' 9"'QE$  (1.753 m)   Wt 216 lb 3.2 oz (98.1 kg)   SpO2 98%   BMI 31.93 kg/m   BP Readings from Last 3 Encounters:  04/03/21 131/76  01/01/21 123/78  09/27/20 121/71    Wt Readings from Last 3 Encounters:  04/03/21 216 lb 3.2 oz (98.1 kg)  01/04/21 211 lb (95.7 kg)  01/01/21 211 lb (95.7 kg)     Physical Exam Constitutional:      General: Roger Palmer is not in acute distress.    Appearance: Roger Palmer is well-developed.  HENT:     Head: Normocephalic and atraumatic.     Right Ear: External ear normal.  Left Ear: External ear normal.     Nose: Nose normal.  Eyes:     Conjunctiva/sclera: Conjunctivae normal.     Pupils: Pupils are equal, round, and reactive to light.  Cardiovascular:     Rate and Rhythm: Normal rate and regular rhythm.     Heart sounds: Normal heart sounds. No  murmur heard. Pulmonary:     Effort: Pulmonary effort is normal. No respiratory distress.     Breath sounds: Normal breath sounds. No wheezing or rales.  Abdominal:     Palpations: Abdomen is soft.     Tenderness: There is no abdominal tenderness.  Musculoskeletal:        General: Normal range of motion.     Cervical back: Normal range of motion and neck supple.  Skin:    General: Skin is warm and dry.  Neurological:     Mental Status: Roger Palmer is alert and oriented to person, place, and time.     Deep Tendon Reflexes: Reflexes are normal and symmetric.  Psychiatric:        Behavior: Behavior normal.        Thought Content: Thought content normal.        Judgment: Judgment normal.    Diabetic Foot Exam - Simple   No data filed       Assessment & Plan:   Roger Palmer was seen today for medical management of chronic issues.  Diagnoses and all orders for this visit:  Type 2 diabetes mellitus with diabetic neuropathy, without long-term current use of insulin (HCC) -     Bayer DCA Hb A1c Waived -     CBC with Differential/Platelet -     metFORMIN (GLUCOPHAGE-XR) 500 MG 24 hr tablet; TAKE 1 TABLET EVERY MORNING WITH BREAKFAST  Hyperlipidemia associated with type 2 diabetes mellitus (HCC) -     Lipid panel -     atorvastatin (LIPITOR) 40 MG tablet; TAKE ONE (1) TABLET EACH DAY -     fenofibrate 160 MG tablet; TAKE ONE (1) TABLET EACH DAY  Hypertension associated with diabetes ([place]) -     CMP14+EGFR  Prostate cancer screening -     PSA, total and free  Benign prostatic hyperplasia without lower urinary tract symptoms -     finasteride (PROSCAR) 5 MG tablet; TAKE ONE (1) TABLET EACH DAY -     tamsulosin (FLOMAX) 0.4 MG CAPS capsule; TAKE ONE (1) CAPSULE EACH DAY  Chronic rhinitis -     loratadine (CLARITIN) 10 MG tablet; Take 1 tablet (10 mg total) by mouth as needed for allergies.  Gastroesophageal reflux disease without esophagitis -     omeprazole (PRILOSEC) 20 MG capsule;  TAKE ONE (1) CAPSULE EACH DAY  Detached retina, right  Other orders -     valsartan-hydrochlorothiazide (DIOVAN-HCT) 320-25 MG tablet; Take 1 tablet by mouth daily. For Blood Pressure -     canagliflozin (INVOKANA) 300 MG TABS tablet; Take 1 tablet (300 mg total) by mouth daily before breakfast. For diabetes  I have changed Roger Palmer [name]'s valsartan-hydrochlorothiazide. I am also having him start on canagliflozin. Additionally, I am having him maintain his Vitamin D (Ergocalciferol), atorvastatin, fenofibrate, finasteride, loratadine, metFORMIN, omeprazole, and tamsulosin.  Meds ordered this encounter  Medications   atorvastatin (LIPITOR) 40 MG tablet    Sig: TAKE ONE (1) TABLET EACH DAY    Dispense:  90 tablet    Refill:  2   fenofibrate 160 MG tablet    Sig: TAKE ONE (1)  TABLET EACH DAY    Dispense:  90 tablet    Refill:  2   finasteride (PROSCAR) 5 MG tablet    Sig: TAKE ONE (1) TABLET EACH DAY    Dispense:  90 tablet    Refill:  2   loratadine (CLARITIN) 10 MG tablet    Sig: Take 1 tablet (10 mg total) by mouth as needed for allergies.    Dispense:  90 tablet    Refill:  3   metFORMIN (GLUCOPHAGE-XR) 500 MG 24 hr tablet    Sig: TAKE 1 TABLET EVERY MORNING WITH BREAKFAST    Dispense:  90 tablet    Refill:  3   omeprazole (PRILOSEC) 20 MG capsule    Sig: TAKE ONE (1) CAPSULE EACH DAY    Dispense:  90 capsule    Refill:  2   tamsulosin (FLOMAX) 0.4 MG CAPS capsule    Sig: TAKE ONE (1) CAPSULE EACH DAY    Dispense:  90 capsule    Refill:  2   valsartan-hydrochlorothiazide (DIOVAN-HCT) 320-25 MG tablet    Sig: Take 1 tablet by mouth daily. For Blood Pressure    Dispense:  90 tablet    Refill:  2   canagliflozin (INVOKANA) 300 MG TABS tablet    Sig: Take 1 tablet (300 mg total) by mouth daily before breakfast. For diabetes    Dispense:  30 tablet    Refill:  5     Follow-up: No follow-ups on file.  Claretta Fraise, M.D.

## 2021-04-04 LAB — CMP14+EGFR
ALT: 28 IU/L (ref 0–44)
AST: 27 IU/L (ref 0–40)
Albumin/Globulin Ratio: 1.7 (ref 1.2–2.2)
Albumin: 4.5 g/dL (ref 3.7–4.7)
Alkaline Phosphatase: 56 IU/L (ref 44–121)
BUN/Creatinine Ratio: 11 (ref 10–24)
BUN: 14 mg/dL (ref 8–27)
Bilirubin Total: 0.6 mg/dL (ref 0.0–1.2)
CO2: 25 mmol/L (ref 20–29)
Calcium: 10.1 mg/dL (ref 8.6–10.2)
Chloride: 104 mmol/L (ref 96–106)
Creatinine, Ser: 1.33 mg/dL — ABNORMAL HIGH (ref 0.76–1.27)
Globulin, Total: 2.6 g/dL (ref 1.5–4.5)
Glucose: 179 mg/dL — ABNORMAL HIGH (ref 70–99)
Potassium: 4.4 mmol/L (ref 3.5–5.2)
Sodium: 142 mmol/L (ref 134–144)
Total Protein: 7.1 g/dL (ref 6.0–8.5)
eGFR: 57 mL/min/{1.73_m2} — ABNORMAL LOW (ref >59)

## 2021-04-04 LAB — CBC WITH DIFFERENTIAL/PLATELET
Basophils Absolute: 0.1 10*3/uL (ref 0.0–0.2)
Basos: 1 %
EOS (ABSOLUTE): 0.2 10*3/uL (ref 0.0–0.4)
Eos: 3 %
Hematocrit: 45.3 % (ref 37.5–51.0)
Hemoglobin: 15.3 g/dL (ref 13.0–17.7)
Immature Grans (Abs): 0 10*3/uL (ref 0.0–0.1)
Immature Granulocytes: 0 %
Lymphocytes Absolute: 2.2 10*3/uL (ref 0.7–3.1)
Lymphs: 33 %
MCH: 30.5 pg (ref 26.6–33.0)
MCHC: 33.8 g/dL (ref 31.5–35.7)
MCV: 90 fL (ref 79–97)
Monocytes Absolute: 0.6 10*3/uL (ref 0.1–0.9)
Monocytes: 9 %
Neutrophils Absolute: 3.5 10*3/uL (ref 1.4–7.0)
Neutrophils: 54 %
Platelets: 223 10*3/uL (ref 150–450)
RBC: 5.02 x10E6/uL (ref 4.14–5.80)
RDW: 12.4 % (ref 11.6–15.4)
WBC: 6.5 10*3/uL (ref 3.4–10.8)

## 2021-04-04 LAB — LIPID PANEL
Chol/HDL Ratio: 3.5 ratio (ref 0.0–5.0)
Cholesterol, Total: 145 mg/dL (ref 100–199)
HDL: 41 mg/dL (ref >39)
LDL Chol Calc (NIH): 88 mg/dL (ref 0–99)
Triglycerides: 83 mg/dL (ref 0–149)
VLDL Cholesterol Cal: 16 mg/dL (ref 5–40)

## 2021-04-04 LAB — PSA, TOTAL AND FREE
PSA, Free Pct: 40 %
PSA, Free: 0.2 ng/mL
Prostate Specific Ag, Serum: 0.5 ng/mL (ref 0.0–4.0)

## 2021-04-04 NOTE — Progress Notes (Signed)
Hello Roger Palmer,  Your lab result is normal and/or stable.Some minor variations that are not significant are commonly marked abnormal, but do not represent any medical problem for you.  Best regards, Hadiyah Maricle, M.D.

## 2021-04-16 ENCOUNTER — Encounter: Payer: Self-pay | Admitting: Family Medicine

## 2021-04-18 MED ORDER — CANAGLIFLOZIN 100 MG PO TABS: 100.0000 mg | ORAL_TABLET | Freq: Every day | ORAL | 1 refills | Status: DC

## 2021-04-19 DIAGNOSIS — H2513 Age-related nuclear cataract, bilateral: Secondary | ICD-10-CM | POA: Insufficient documentation

## 2021-04-19 DIAGNOSIS — Z9889 Other specified postprocedural states: Secondary | ICD-10-CM | POA: Diagnosis not present

## 2021-04-19 DIAGNOSIS — H25813 Combined forms of age-related cataract, bilateral: Secondary | ICD-10-CM | POA: Diagnosis not present

## 2021-04-19 DIAGNOSIS — Z8669 Personal history of other diseases of the nervous system and sense organs: Secondary | ICD-10-CM | POA: Diagnosis not present

## 2021-05-24 DIAGNOSIS — H43812 Vitreous degeneration, left eye: Secondary | ICD-10-CM | POA: Diagnosis not present

## 2021-05-24 DIAGNOSIS — H3341 Traction detachment of retina, right eye: Secondary | ICD-10-CM | POA: Diagnosis not present

## 2021-05-24 DIAGNOSIS — H35371 Puckering of macula, right eye: Secondary | ICD-10-CM | POA: Diagnosis not present

## 2021-05-24 DIAGNOSIS — T85398D Other mechanical complication of other ocular prosthetic devices, implants and grafts, subsequent encounter: Secondary | ICD-10-CM | POA: Diagnosis not present

## 2021-05-24 LAB — HM DIABETES EYE EXAM

## 2021-06-29 DIAGNOSIS — Z9889 Other specified postprocedural states: Secondary | ICD-10-CM | POA: Diagnosis not present

## 2021-06-29 DIAGNOSIS — H25813 Combined forms of age-related cataract, bilateral: Secondary | ICD-10-CM | POA: Diagnosis not present

## 2021-06-29 DIAGNOSIS — Z8669 Personal history of other diseases of the nervous system and sense organs: Secondary | ICD-10-CM | POA: Diagnosis not present

## 2021-07-04 ENCOUNTER — Encounter: Payer: Self-pay | Admitting: Family Medicine

## 2021-07-04 ENCOUNTER — Ambulatory Visit (INDEPENDENT_AMBULATORY_CARE_PROVIDER_SITE_OTHER): Payer: Medicare Other | Admitting: Family Medicine

## 2021-07-04 VITALS — BP 123/72 | HR 91 | Temp 97.1°F | Ht 69.0 in | Wt 209.4 lb

## 2021-07-04 DIAGNOSIS — I152 Hypertension secondary to endocrine disorders: Secondary | ICD-10-CM | POA: Diagnosis not present

## 2021-07-04 DIAGNOSIS — E1140 Type 2 diabetes mellitus with diabetic neuropathy, unspecified: Secondary | ICD-10-CM | POA: Diagnosis not present

## 2021-07-04 DIAGNOSIS — E1159 Type 2 diabetes mellitus with other circulatory complications: Secondary | ICD-10-CM

## 2021-07-04 DIAGNOSIS — E785 Hyperlipidemia, unspecified: Secondary | ICD-10-CM

## 2021-07-04 DIAGNOSIS — E1169 Type 2 diabetes mellitus with other specified complication: Secondary | ICD-10-CM | POA: Diagnosis not present

## 2021-07-04 DIAGNOSIS — Z23 Encounter for immunization: Secondary | ICD-10-CM

## 2021-07-04 LAB — CMP14+EGFR
ALT: 27 IU/L (ref 0–44)
AST: 23 IU/L (ref 0–40)
Albumin/Globulin Ratio: 1.7 (ref 1.2–2.2)
Albumin: 4.3 g/dL (ref 3.7–4.7)
Alkaline Phosphatase: 53 IU/L (ref 44–121)
BUN/Creatinine Ratio: 14 (ref 10–24)
BUN: 18 mg/dL (ref 8–27)
Bilirubin Total: 0.4 mg/dL (ref 0.0–1.2)
CO2: 24 mmol/L (ref 20–29)
Calcium: 10.4 mg/dL — ABNORMAL HIGH (ref 8.6–10.2)
Chloride: 107 mmol/L — ABNORMAL HIGH (ref 96–106)
Creatinine, Ser: 1.31 mg/dL — ABNORMAL HIGH (ref 0.76–1.27)
Globulin, Total: 2.6 g/dL (ref 1.5–4.5)
Glucose: 154 mg/dL — ABNORMAL HIGH (ref 70–99)
Potassium: 4.6 mmol/L (ref 3.5–5.2)
Sodium: 147 mmol/L — ABNORMAL HIGH (ref 134–144)
Total Protein: 6.9 g/dL (ref 6.0–8.5)
eGFR: 44 mL/min/{1.73_m2} — ABNORMAL LOW (ref >59)

## 2021-07-04 LAB — CBC WITH DIFFERENTIAL/PLATELET
Basophils Absolute: 0.1 10*3/uL (ref 0.0–0.2)
Basos: 1 %
EOS (ABSOLUTE): 0.2 10*3/uL (ref 0.0–0.4)
Eos: 3 %
Hematocrit: 45.8 % (ref 37.5–51.0)
Hemoglobin: 15.2 g/dL (ref 13.0–17.7)
Immature Grans (Abs): 0 10*3/uL (ref 0.0–0.1)
Immature Granulocytes: 0 %
Lymphocytes Absolute: 2.2 10*3/uL (ref 0.7–3.1)
Lymphs: 30 %
MCH: 30.6 pg (ref 26.6–33.0)
MCHC: 33.2 g/dL (ref 31.5–35.7)
MCV: 92 fL (ref 79–97)
Monocytes Absolute: 0.6 10*3/uL (ref 0.1–0.9)
Monocytes: 8 %
Neutrophils Absolute: 4.3 10*3/uL (ref 1.4–7.0)
Neutrophils: 58 %
Platelets: 217 10*3/uL (ref 150–450)
RBC: 4.96 x10E6/uL (ref 4.14–5.80)
RDW: 12.4 % (ref 11.6–15.4)
WBC: 7.4 10*3/uL (ref 3.4–10.8)

## 2021-07-04 LAB — LIPID PANEL
Chol/HDL Ratio: 3.5 ratio (ref 0.0–5.0)
Cholesterol, Total: 148 mg/dL (ref 100–199)
HDL: 42 mg/dL (ref >39)
LDL Chol Calc (NIH): 91 mg/dL (ref 0–99)
Triglycerides: 80 mg/dL (ref 0–149)
VLDL Cholesterol Cal: 15 mg/dL (ref 5–40)

## 2021-07-04 LAB — BAYER DCA HB A1C WAIVED: HB A1C (BAYER DCA - WAIVED): 7.9 % — ABNORMAL HIGH (ref 4.8–5.6)

## 2021-07-04 MED ORDER — VITAMIN D (ERGOCALCIFEROL) 1.25 MG (50000 UNIT) PO CAPS: 50000.0000 [IU] | ORAL_CAPSULE | ORAL | 3 refills | Status: DC

## 2021-07-04 MED ORDER — DAPAGLIFLOZIN PROPANEDIOL 10 MG PO TABS: 10.0000 mg | ORAL_TABLET | Freq: Every day | ORAL | 3 refills | Status: DC

## 2021-07-04 NOTE — Addendum Note (Signed)
Addended by: Christia Reading on: 07/04/2021 04:48 PM   Modules accepted: Orders

## 2021-07-04 NOTE — Progress Notes (Signed)
Subjective:  Patient ID: Roger Palmer,  male    DOB: 1950-12-26  Age: 71 y.o.    CC: Medical Management of Chronic Issues   HPI Roger Palmer presents for  follow-up of hypertension. Patient has no history of headache chest pain or shortness of breath or recent cough. Patient also denies symptoms of TIA such as numbness weakness lateralizing. Patient denies side effects from medication. States taking it regularly.  Patient also  in for follow-up of elevated cholesterol. Doing well without complaints on current medication. Denies side effects  including myalgia and arthralgia and nausea. Also in today for liver function testing. Currently no chest pain, shortness of breath or other cardiovascular related symptoms noted.  Follow-up of diabetes. Patient does check blood sugar at home. Readings run between 150 and 170 fasting. Patient denies symptoms such as excessive hunger or urinary frequency, excessive hunger, nausea No significant hypoglycemic spells noted. Medications reviewed. Pt reports taking them regularly. Pt. denies complication/adverse reaction today.   Invokana is too expensive.    History Roger Palmer has a past medical history of Allergy, Back pain, Bee sting allergy, BPH (benign prostatic hypertrophy), Diabetes mellitus without complication ([place]), Gastritis, GERD (gastroesophageal reflux disease), Herniated disc, Hypercholesteremia, Hypertension, Nephrolithiasis, Obesity, PONV (postoperative nausea and vomiting), Reflux, and Vitamin D deficiency.   He has a past surgical history that includes Inguinal hernia repair; kidney stones; ORIF R elbow (07/12/2012); ORIF R wirst (07/12/2012); Open reduction internal fixation (orif) distal radial fracture (Right, 07/11/2012); ORIF radial fracture (Right, 07/11/2012); ORIF acetabular fracture (Right, 07/14/2012); Fracture surgery; and Eye surgery (Right).   His family history includes ADD / ADHD in his son; Anxiety disorder in his son;  CAD in his paternal grandmother; Cancer in his maternal grandmother and sister; Diabetes in his mother, sister, and sister; Heart attack in his maternal grandfather; Heart attack (age of onset: 73) in his mother; Hypertension in his father and mother; Nephrolithiasis in his brother; Stroke in his father.He reports that he has never smoked. He has never used smokeless tobacco. He reports current alcohol use. He reports that he does not use drugs.  Current Outpatient Medications on File Prior to Visit  Medication Sig Dispense Refill   atorvastatin (LIPITOR) 40 MG tablet TAKE ONE (1) TABLET EACH DAY 90 tablet 2   canagliflozin (INVOKANA) 100 MG TABS tablet Take 1 tablet (100 mg total) by mouth daily before breakfast. For diabetes 90 tablet 1   fenofibrate 160 MG tablet TAKE ONE (1) TABLET EACH DAY 90 tablet 2   finasteride (PROSCAR) 5 MG tablet TAKE ONE (1) TABLET EACH DAY 90 tablet 2   loratadine (CLARITIN) 10 MG tablet Take 1 tablet (10 mg total) by mouth as needed for allergies. 90 tablet 3   metFORMIN (GLUCOPHAGE-XR) 500 MG 24 hr tablet TAKE 1 TABLET EVERY MORNING WITH BREAKFAST 90 tablet 3   omeprazole (PRILOSEC) 20 MG capsule TAKE ONE (1) CAPSULE EACH DAY 90 capsule 2   tamsulosin (FLOMAX) 0.4 MG CAPS capsule TAKE ONE (1) CAPSULE EACH DAY 90 capsule 2   valsartan-hydrochlorothiazide (DIOVAN-HCT) 320-25 MG tablet Take 1 tablet by mouth daily. For Blood Pressure 90 tablet 2   Vitamin D, Ergocalciferol, (DRISDOL) 1.25 MG (50000 UNIT) CAPS capsule Take 50,000 Units by mouth every 7 (seven) days.     No current facility-administered medications on file prior to visit.    ROS Review of Systems  Constitutional: Negative.  Negative for fever.  HENT: Negative.    Eyes:  Negative for  visual disturbance.  Respiratory:  Negative for cough and shortness of breath.   Cardiovascular:  Negative for chest pain and leg swelling.  Gastrointestinal:  Negative for abdominal pain, diarrhea, nausea and  vomiting.  Genitourinary:  Negative for difficulty urinating.  Musculoskeletal:  Negative for arthralgias and myalgias.  Skin:  Negative for rash.  Neurological:  Negative for headaches.  Psychiatric/Behavioral:  Negative for sleep disturbance.    Objective:  Ht $R'5\' 9"'uo$  (1.753 m)    BMI 31.93 kg/m   BP Readings from Last 3 Encounters:  04/03/21 131/76  01/01/21 123/78  09/27/20 121/71    Wt Readings from Last 3 Encounters:  04/03/21 216 lb 3.2 oz (98.1 kg)  01/04/21 211 lb (95.7 kg)  01/01/21 211 lb (95.7 kg)     Physical Exam Vitals reviewed.  Constitutional:      Appearance: He is well-developed.  HENT:     Head: Normocephalic and atraumatic.     Right Ear: External ear normal.     Left Ear: External ear normal.     Mouth/Throat:     Pharynx: No oropharyngeal exudate or posterior oropharyngeal erythema.  Eyes:     Pupils: Pupils are equal, round, and reactive to light.  Cardiovascular:     Rate and Rhythm: Normal rate and regular rhythm.     Heart sounds: No murmur heard. Pulmonary:     Effort: No respiratory distress.     Breath sounds: Normal breath sounds.  Musculoskeletal:     Cervical back: Normal range of motion and neck supple.  Neurological:     Mental Status: He is alert and oriented to person, place, and time.     Assessment & Plan:   Roger Palmer was seen today for medical management of chronic issues.  Diagnoses and all orders for this visit:  Type 2 diabetes mellitus with diabetic neuropathy, without long-term current use of insulin ([place]) -     Bayer DCA Hb A1c Waived  Hyperlipidemia associated with type 2 diabetes mellitus ([place]) -     Lipid panel  Hypertension associated with diabetes ([place]) -     CBC with Differential/Platelet -     CMP14+EGFR   I am having Roger Palmer "Roger Palmer" maintain his Vitamin D (Ergocalciferol), atorvastatin, fenofibrate, finasteride, loratadine, metFORMIN, omeprazole, tamsulosin, valsartan-hydrochlorothiazide, and  canagliflozin.  No orders of the defined types were placed in this encounter.    Follow-up: Return in about 3 months (around 10/01/2021).  Claretta Fraise, M.D.

## 2021-07-09 NOTE — Progress Notes (Signed)
Hello Linzie,  Your lab result is normal and/or stable.Some minor variations that are not significant are commonly marked abnormal, but do not represent any medical problem for you.  Best regards, Bessie Boyte, M.D.

## 2021-07-17 DIAGNOSIS — E1136 Type 2 diabetes mellitus with diabetic cataract: Secondary | ICD-10-CM | POA: Diagnosis not present

## 2021-07-17 DIAGNOSIS — Z8669 Personal history of other diseases of the nervous system and sense organs: Secondary | ICD-10-CM | POA: Insufficient documentation

## 2021-07-17 DIAGNOSIS — H2181 Floppy iris syndrome: Secondary | ICD-10-CM | POA: Diagnosis not present

## 2021-07-17 DIAGNOSIS — Z6830 Body mass index (BMI) 30.0-30.9, adult: Secondary | ICD-10-CM | POA: Diagnosis not present

## 2021-07-17 DIAGNOSIS — Z79899 Other long term (current) drug therapy: Secondary | ICD-10-CM | POA: Diagnosis not present

## 2021-07-17 DIAGNOSIS — E669 Obesity, unspecified: Secondary | ICD-10-CM | POA: Diagnosis not present

## 2021-07-17 DIAGNOSIS — E785 Hyperlipidemia, unspecified: Secondary | ICD-10-CM | POA: Diagnosis not present

## 2021-07-17 DIAGNOSIS — K219 Gastro-esophageal reflux disease without esophagitis: Secondary | ICD-10-CM | POA: Diagnosis not present

## 2021-07-17 DIAGNOSIS — I10 Essential (primary) hypertension: Secondary | ICD-10-CM | POA: Diagnosis not present

## 2021-07-17 DIAGNOSIS — H25811 Combined forms of age-related cataract, right eye: Secondary | ICD-10-CM | POA: Diagnosis not present

## 2021-07-17 DIAGNOSIS — H21561 Pupillary abnormality, right eye: Secondary | ICD-10-CM | POA: Diagnosis not present

## 2021-07-17 DIAGNOSIS — Z961 Presence of intraocular lens: Secondary | ICD-10-CM | POA: Insufficient documentation

## 2021-07-17 DIAGNOSIS — Z7984 Long term (current) use of oral hypoglycemic drugs: Secondary | ICD-10-CM | POA: Diagnosis not present

## 2021-07-31 DIAGNOSIS — H3341 Traction detachment of retina, right eye: Secondary | ICD-10-CM | POA: Diagnosis not present

## 2021-07-31 DIAGNOSIS — H35371 Puckering of macula, right eye: Secondary | ICD-10-CM | POA: Diagnosis not present

## 2021-08-20 DIAGNOSIS — H3341 Traction detachment of retina, right eye: Secondary | ICD-10-CM | POA: Diagnosis not present

## 2021-09-18 DIAGNOSIS — H35371 Puckering of macula, right eye: Secondary | ICD-10-CM | POA: Diagnosis not present

## 2021-09-27 DIAGNOSIS — H35371 Puckering of macula, right eye: Secondary | ICD-10-CM | POA: Diagnosis not present

## 2021-10-26 DIAGNOSIS — H43812 Vitreous degeneration, left eye: Secondary | ICD-10-CM | POA: Diagnosis not present

## 2021-10-26 DIAGNOSIS — H35371 Puckering of macula, right eye: Secondary | ICD-10-CM | POA: Diagnosis not present

## 2021-10-27 DIAGNOSIS — H40033 Anatomical narrow angle, bilateral: Secondary | ICD-10-CM | POA: Diagnosis not present

## 2021-10-27 DIAGNOSIS — H524 Presbyopia: Secondary | ICD-10-CM | POA: Diagnosis not present

## 2021-10-27 DIAGNOSIS — H2513 Age-related nuclear cataract, bilateral: Secondary | ICD-10-CM | POA: Diagnosis not present

## 2021-12-18 ENCOUNTER — Other Ambulatory Visit: Payer: Self-pay | Admitting: Family Medicine

## 2021-12-18 DIAGNOSIS — E1169 Type 2 diabetes mellitus with other specified complication: Secondary | ICD-10-CM

## 2021-12-18 DIAGNOSIS — N400 Enlarged prostate without lower urinary tract symptoms: Secondary | ICD-10-CM

## 2021-12-24 ENCOUNTER — Encounter: Payer: Self-pay | Admitting: Family Medicine

## 2021-12-24 ENCOUNTER — Ambulatory Visit (INDEPENDENT_AMBULATORY_CARE_PROVIDER_SITE_OTHER): Payer: Medicare Other | Admitting: Family Medicine

## 2021-12-24 VITALS — BP 113/68 | HR 94 | Temp 97.6°F | Ht 69.0 in | Wt 205.6 lb

## 2021-12-24 DIAGNOSIS — I152 Hypertension secondary to endocrine disorders: Secondary | ICD-10-CM

## 2021-12-24 DIAGNOSIS — N400 Enlarged prostate without lower urinary tract symptoms: Secondary | ICD-10-CM | POA: Diagnosis not present

## 2021-12-24 DIAGNOSIS — E785 Hyperlipidemia, unspecified: Secondary | ICD-10-CM

## 2021-12-24 DIAGNOSIS — E1140 Type 2 diabetes mellitus with diabetic neuropathy, unspecified: Secondary | ICD-10-CM | POA: Diagnosis not present

## 2021-12-24 DIAGNOSIS — E1159 Type 2 diabetes mellitus with other circulatory complications: Secondary | ICD-10-CM

## 2021-12-24 DIAGNOSIS — E1169 Type 2 diabetes mellitus with other specified complication: Secondary | ICD-10-CM

## 2021-12-24 LAB — BAYER DCA HB A1C WAIVED: HB A1C (BAYER DCA - WAIVED): 7.4 % — ABNORMAL HIGH (ref 4.8–5.6)

## 2021-12-24 MED ORDER — TAMSULOSIN HCL 0.4 MG PO CAPS: ORAL_CAPSULE | ORAL | 3 refills | Status: DC

## 2021-12-24 MED ORDER — FENOFIBRATE 160 MG PO TABS: ORAL_TABLET | ORAL | 3 refills | Status: DC

## 2021-12-24 MED ORDER — FINASTERIDE 5 MG PO TABS: ORAL_TABLET | ORAL | 3 refills | Status: DC

## 2021-12-24 MED ORDER — ATORVASTATIN CALCIUM 40 MG PO TABS: ORAL_TABLET | ORAL | 3 refills | Status: DC

## 2021-12-24 NOTE — Progress Notes (Signed)
Subjective:  Patient ID: Roger Palmer,  male    DOB: May 23, 1950  Age: 71 y.o.    CC: Medical Management of Chronic Issues   HPI Roger Palmer presents for  follow-up of hypertension. Patient has no history of headache chest pain or shortness of breath or recent cough. Patient also denies symptoms of TIA such as numbness weakness lateralizing. Patient denies side effects from medication. States taking it regularly.  Patient also  in for follow-up of elevated cholesterol. Doing well without complaints on current medication. Denies side effects  including myalgia and arthralgia and nausea. Also in today for liver function testing. Currently no chest pain, shortness of breath or other cardiovascular related symptoms noted.  Follow-up of diabetes. Patient does check blood sugar at home. Readings run between 140 and 180 Patient denies symptoms such as excessive hunger or urinary frequency, excessive hunger, nausea No significant hypoglycemic spells noted. Medications reviewed. Pt reports taking them regularly. Pt. denies complication/adverse reaction today.    History Roger Palmer has a past medical history of Allergy, Back pain, Bee sting allergy, BPH (benign prostatic hypertrophy), Diabetes mellitus without complication ([place]), Gastritis, GERD (gastroesophageal reflux disease), Herniated disc, Hypercholesteremia, Hypertension, Nephrolithiasis, Obesity, PONV (postoperative nausea and vomiting), Reflux, and Vitamin D deficiency.   Roger Palmer has a past surgical history that includes Inguinal hernia repair; kidney stones; ORIF R elbow (07/12/2012); ORIF R wirst (07/12/2012); Open reduction internal fixation (orif) distal radial fracture (Right, 07/11/2012); ORIF radial fracture (Right, 07/11/2012); ORIF acetabular fracture (Right, 07/14/2012); Fracture surgery; and Eye surgery (Right).   His family history includes ADD / ADHD in his son; Anxiety disorder in his son; CAD in his paternal grandmother; Cancer  in his maternal grandmother and sister; Diabetes in his mother, sister, and sister; Heart attack in his maternal grandfather; Heart attack (age of onset: 15) in his mother; Hypertension in his father and mother; Nephrolithiasis in his brother; Stroke in his father.Roger Palmer reports that Roger Palmer has never smoked. Roger Palmer has never used smokeless tobacco. Roger Palmer reports current alcohol use. Roger Palmer reports that Roger Palmer does not use drugs.  Current Outpatient Medications on File Prior to Visit  Medication Sig Dispense Refill   dapagliflozin propanediol (FARXIGA) 10 MG TABS tablet Take 1 tablet (10 mg total) by mouth daily before breakfast. 90 tablet 3   loratadine (CLARITIN) 10 MG tablet Take 1 tablet (10 mg total) by mouth as needed for allergies. 90 tablet 3   metFORMIN (GLUCOPHAGE-XR) 500 MG 24 hr tablet TAKE 1 TABLET EVERY MORNING WITH BREAKFAST 90 tablet 3   omeprazole (PRILOSEC) 20 MG capsule TAKE ONE (1) CAPSULE EACH DAY 90 capsule 2   valsartan-hydrochlorothiazide (DIOVAN-HCT) 320-25 MG tablet Take 1 tablet by mouth daily. For Blood Pressure 90 tablet 2   Vitamin D, Ergocalciferol, (DRISDOL) 1.25 MG (50000 UNIT) CAPS capsule Take 1 capsule (50,000 Units total) by mouth every 7 (seven) days. 13 capsule 3   No current facility-administered medications on file prior to visit.    ROS Review of Systems  Constitutional:  Negative for fever.  Respiratory:  Negative for shortness of breath.   Cardiovascular:  Negative for chest pain.  Musculoskeletal:  Negative for arthralgias.  Skin:  Negative for rash.    Objective:  BP 113/68   Pulse 94   Temp 97.6 F (36.4 C)   Ht $R'5\' 9"'Xl$  (1.753 m)   Wt 205 lb 9.6 oz (93.3 kg)   SpO2 97%   BMI 30.36 kg/m   BP Readings from Last 3 Encounters:  12/24/21 113/68  07/04/21 123/72  04/03/21 131/76    Wt Readings from Last 3 Encounters:  12/24/21 205 lb 9.6 oz (93.3 kg)  07/04/21 209 lb 6.4 oz (95 kg)  04/03/21 216 lb 3.2 oz (98.1 kg)     Physical Exam Vitals reviewed.   Constitutional:      Appearance: Roger Palmer is well-developed.  HENT:     Head: Normocephalic and atraumatic.     Right Ear: External ear normal.     Left Ear: External ear normal.     Mouth/Throat:     Pharynx: No oropharyngeal exudate or posterior oropharyngeal erythema.  Eyes:     Pupils: Pupils are equal, round, and reactive to light.  Cardiovascular:     Rate and Rhythm: Normal rate and regular rhythm.     Heart sounds: No murmur heard. Pulmonary:     Effort: No respiratory distress.     Breath sounds: Normal breath sounds.  Musculoskeletal:     Cervical back: Normal range of motion and neck supple.  Neurological:     Mental Status: Roger Palmer is alert and oriented to person, place, and time.     Diabetic Foot Exam - Simple   Simple Foot Form Diabetic Foot exam was performed with the following findings: Yes 12/24/2021 10:44 AM  Visual Inspection No deformities, no ulcerations, no other skin breakdown bilaterally: Yes Sensation Testing Intact to touch and monofilament testing bilaterally: Yes Pulse Check Posterior Tibialis and Dorsalis pulse intact bilaterally: Yes Comments     Lab Results  Component Value Date   HGBA1C 7.9 (H) 07/04/2021   HGBA1C 8.4 (H) 04/03/2021   HGBA1C 7.1 (H) 01/01/2021    Assessment & Plan:   Roger Palmer was seen today for medical management of chronic issues.  Diagnoses and all orders for this visit:  Type 2 diabetes mellitus with diabetic neuropathy, without long-term current use of insulin ([place]) -     Bayer DCA Hb A1c Waived  Hyperlipidemia associated with type 2 diabetes mellitus (HCC) -     Lipid panel -     atorvastatin (LIPITOR) 40 MG tablet; TAKE ONE (1) TABLET BY MOUTH EVERY DAY -     fenofibrate 160 MG tablet; TAKE ONE (1) TABLET EACH DAY  Hypertension associated with diabetes ([place]) -     CBC with Differential/Platelet -     CMP14+EGFR  Benign prostatic hyperplasia without lower urinary tract symptoms -     finasteride (PROSCAR) 5 MG  tablet; TAKE ONE (1) TABLET EACH DAY -     tamsulosin (FLOMAX) 0.4 MG CAPS capsule; TAKE ONE (1) CAPSULE EACH DAY   I am having Roger L. Gorter [name] maintain his loratadine, metFORMIN, omeprazole, valsartan-hydrochlorothiazide, dapagliflozin propanediol, Vitamin D (Ergocalciferol), atorvastatin, fenofibrate, finasteride, and tamsulosin.  Reviewed diet, exeecise as means to bring A1c down to 7.0 or lower   Follow-up: Return in about 3 months (around 03/26/2022) for diabetes.  Claretta Fraise, M.D.

## 2021-12-25 LAB — CMP14+EGFR
ALT: 21 IU/L (ref 0–44)
AST: 21 IU/L (ref 0–40)
Albumin/Globulin Ratio: 1.8 (ref 1.2–2.2)
Albumin: 4.4 g/dL (ref 3.8–4.8)
Alkaline Phosphatase: 52 IU/L (ref 44–121)
BUN/Creatinine Ratio: 14 (ref 10–24)
BUN: 19 mg/dL (ref 8–27)
Bilirubin Total: 0.5 mg/dL (ref 0.0–1.2)
CO2: 23 mmol/L (ref 20–29)
Calcium: 10.2 mg/dL (ref 8.6–10.2)
Chloride: 107 mmol/L — ABNORMAL HIGH (ref 96–106)
Creatinine, Ser: 1.36 mg/dL — ABNORMAL HIGH (ref 0.76–1.27)
Globulin, Total: 2.5 g/dL (ref 1.5–4.5)
Glucose: 147 mg/dL — ABNORMAL HIGH (ref 70–99)
Potassium: 4.3 mmol/L (ref 3.5–5.2)
Sodium: 146 mmol/L — ABNORMAL HIGH (ref 134–144)
Total Protein: 6.9 g/dL (ref 6.0–8.5)
eGFR: 55 mL/min/{1.73_m2} — ABNORMAL LOW (ref >59)

## 2021-12-25 LAB — CBC WITH DIFFERENTIAL/PLATELET
Basophils Absolute: 0.1 10*3/uL (ref 0.0–0.2)
Basos: 1 %
EOS (ABSOLUTE): 0.2 10*3/uL (ref 0.0–0.4)
Eos: 3 %
Hematocrit: 47.2 % (ref 37.5–51.0)
Hemoglobin: 15.9 g/dL (ref 13.0–17.7)
Immature Grans (Abs): 0 10*3/uL (ref 0.0–0.1)
Immature Granulocytes: 0 %
Lymphocytes Absolute: 1.8 10*3/uL (ref 0.7–3.1)
Lymphs: 29 %
MCH: 30.4 pg (ref 26.6–33.0)
MCHC: 33.7 g/dL (ref 31.5–35.7)
MCV: 90 fL (ref 79–97)
Monocytes Absolute: 0.4 10*3/uL (ref 0.1–0.9)
Monocytes: 7 %
Neutrophils Absolute: 3.7 10*3/uL (ref 1.4–7.0)
Neutrophils: 60 %
Platelets: 214 10*3/uL (ref 150–450)
RBC: 5.23 x10E6/uL (ref 4.14–5.80)
RDW: 12.2 % (ref 11.6–15.4)
WBC: 6.2 10*3/uL (ref 3.4–10.8)

## 2021-12-25 LAB — LIPID PANEL
Chol/HDL Ratio: 3.3 ratio (ref 0.0–5.0)
Cholesterol, Total: 143 mg/dL (ref 100–199)
HDL: 44 mg/dL (ref >39)
LDL Chol Calc (NIH): 85 mg/dL (ref 0–99)
Triglycerides: 68 mg/dL (ref 0–149)
VLDL Cholesterol Cal: 14 mg/dL (ref 5–40)

## 2021-12-26 NOTE — Progress Notes (Signed)
Hello Berl,  Your lab result is normal and/or stable.Some minor variations that are not significant are commonly marked abnormal, but do not represent any medical problem for you.  Best regards, Dicky Boer, M.D.

## 2021-12-28 DIAGNOSIS — H31091 Other chorioretinal scars, right eye: Secondary | ICD-10-CM | POA: Diagnosis not present

## 2021-12-28 DIAGNOSIS — H43812 Vitreous degeneration, left eye: Secondary | ICD-10-CM | POA: Diagnosis not present

## 2021-12-28 DIAGNOSIS — Q141 Congenital malformation of retina: Secondary | ICD-10-CM | POA: Diagnosis not present

## 2022-01-02 DIAGNOSIS — L570 Actinic keratosis: Secondary | ICD-10-CM | POA: Diagnosis not present

## 2022-01-02 DIAGNOSIS — D1801 Hemangioma of skin and subcutaneous tissue: Secondary | ICD-10-CM | POA: Diagnosis not present

## 2022-01-02 DIAGNOSIS — C44329 Squamous cell carcinoma of skin of other parts of face: Secondary | ICD-10-CM | POA: Diagnosis not present

## 2022-01-02 DIAGNOSIS — D692 Other nonthrombocytopenic purpura: Secondary | ICD-10-CM | POA: Diagnosis not present

## 2022-01-02 DIAGNOSIS — L814 Other melanin hyperpigmentation: Secondary | ICD-10-CM | POA: Diagnosis not present

## 2022-01-02 DIAGNOSIS — D2261 Melanocytic nevi of right upper limb, including shoulder: Secondary | ICD-10-CM | POA: Diagnosis not present

## 2022-01-07 ENCOUNTER — Ambulatory Visit (INDEPENDENT_AMBULATORY_CARE_PROVIDER_SITE_OTHER): Payer: Medicare Other

## 2022-01-07 VITALS — Wt 207.0 lb

## 2022-01-07 DIAGNOSIS — Z0000 Encounter for general adult medical examination without abnormal findings: Secondary | ICD-10-CM

## 2022-01-07 DIAGNOSIS — C44329 Squamous cell carcinoma of skin of other parts of face: Secondary | ICD-10-CM | POA: Diagnosis not present

## 2022-01-07 DIAGNOSIS — Z85828 Personal history of other malignant neoplasm of skin: Secondary | ICD-10-CM | POA: Diagnosis not present

## 2022-01-07 NOTE — Progress Notes (Signed)
Subjective:   Roger Palmer is a 71 y.o. male who presents for Medicare Annual/Subsequent preventive examination.  Virtual Visit via Telephone Note  I connected with  Roger Palmer on 01/07/22 at  9:00 AM EDT by telephone and verified that I am speaking with the correct person using two identifiers.  Location: Patient: Home Provider: [facility] Persons participating in the virtual visit: patient/Nurse Health Advisor   I discussed the limitations, risks, security and privacy concerns of performing an evaluation and management service by telephone and the availability of in person appointments. The patient expressed understanding and agreed to proceed.  Interactive audio and video telecommunications were attempted between this nurse and patient, however failed, due to patient having technical difficulties OR patient did not have access to video capability.  We continued and completed visit with audio only.  Some vital signs may be absent or patient reported.   [name] E [name], LPN   Review of Systems     Cardiac Risk Factors include: advanced age (>66mn, >>42women);diabetes mellitus;dyslipidemia;hypertension;male gender;obesity (BMI >30kg/m2)     Objective:    Today's Vitals   01/07/22 0903  Weight: 207 lb (93.9 kg)   Body mass index is 30.57 kg/m.     01/07/2022    9:10 AM 01/04/2021    9:18 AM 06/11/2018    8:16 AM 06/05/2016   10:10 AM 07/23/2012    8:41 PM 07/14/2012   11:00 AM 07/12/2012    4:30 AM  Advanced Directives  Does Patient Have a Medical Advance Directive? Yes Yes Yes Yes Patient does not have advance directive;Patient would like information Patient does not have advance directive Patient does not have advance directive  Type of Advance Directive [place] will Living will;Healthcare Power of [place]? No - copy requested No - copy requested No -  copy requested      Would patient like information on creating a medical advance directive?     Advance directive packet given    Pre-existing out of facility DNR order (yellow form or pink MOST form)     No      Current Medications (verified) Outpatient Encounter Medications as of 01/07/2022  Medication Sig   atorvastatin (LIPITOR) 40 MG tablet TAKE ONE (1) TABLET BY MOUTH EVERY DAY   dapagliflozin propanediol (FARXIGA) 10 MG TABS tablet Take 1 tablet (10 mg total) by mouth daily before breakfast.   fenofibrate 160 MG tablet TAKE ONE (1) TABLET EACH DAY   finasteride (PROSCAR) 5 MG tablet TAKE ONE (1) TABLET EACH DAY   loratadine (CLARITIN) 10 MG tablet Take 1 tablet (10 mg total) by mouth as needed for allergies.   metFORMIN (GLUCOPHAGE-XR) 500 MG 24 hr tablet TAKE 1 TABLET EVERY MORNING WITH BREAKFAST   omeprazole (PRILOSEC) 20 MG capsule TAKE ONE (1) CAPSULE EACH DAY   tamsulosin (FLOMAX) 0.4 MG CAPS capsule TAKE ONE (1) CAPSULE EACH DAY   valsartan-hydrochlorothiazide (DIOVAN-HCT) 320-25 MG tablet Take 1 tablet by mouth daily. For Blood Pressure   Vitamin D, Ergocalciferol, (DRISDOL) 1.25 MG (50000 UNIT) CAPS capsule Take 1 capsule (50,000 Units total) by mouth every 7 (seven) days.   No facility-administered encounter medications on file as of 01/07/2022.    Allergies (verified) Patient has no known allergies.   History: Past Medical History:  Diagnosis Date   Allergy    seasonal    Back pain  Bee sting allergy    BPH (benign prostatic hypertrophy)    Diabetes mellitus without complication (HCC)    Gastritis    GERD (gastroesophageal reflux disease)    Herniated disc    c7-c7, c5-6, c3-4, c4-5   Hypercholesteremia    Hypertension    Nephrolithiasis    Obesity    PONV (postoperative nausea and vomiting)    Reflux    Vitamin D deficiency    Past Surgical History:  Procedure Laterality Date   EYE SURGERY Right    detached retina   FRACTURE SURGERY     right hip     INGUINAL HERNIA REPAIR     right   kidney stones     x2 Dr. Rosana Hoes    OPEN REDUCTION INTERNAL FIXATION (ORIF) DISTAL RADIAL FRACTURE Right 07/11/2012   Procedure: OPEN REDUCTION INTERNAL FIXATION (ORIF) DISTAL RADIAL FRACTURE;  Surgeon: Schuyler Amor, MD;  Location: [place];  Service: Orthopedics;  Laterality: Right;   ORIF ACETABULAR FRACTURE Right 07/14/2012   Procedure: OPEN REDUCTION INTERNAL FIXATION (ORIF) ACETABULAR FRACTURE;  Surgeon: Rozanna Box, MD;  Location: [place];  Service: Orthopedics;  Laterality: Right;   ORIF R elbow  07/12/2012   ORIF R wirst  07/12/2012   ORIF RADIAL FRACTURE Right 07/11/2012   Procedure: OPEN REDUCTION INTERNAL FIXATION (ORIF) RADIAL head FRACTURE;  Surgeon: Schuyler Amor, MD;  Location: [place];  Service: Orthopedics;  Laterality: Right;   Family History  Problem Relation Age of Onset   Heart attack Mother 57   Diabetes Mother        NIDDM   Hypertension Mother    Hypertension Father    Stroke Father    Nephrolithiasis Brother    Cancer Sister        unsure - ? cervical    Diabetes Sister    Diabetes Sister    Anxiety disorder Son        depression    ADD / ADHD Son    Cancer Maternal Grandmother        unknown   Heart attack Maternal Grandfather    CAD Paternal Grandmother    Social History   Socioeconomic History   Marital status: Married    Spouse name: Kenney Houseman   Number of children: 2   Years of education: Not on file   Highest education level: Not on file  Occupational History   Occupation: retired    Comment: [employer]  Tobacco Use   Smoking status: Never   Smokeless tobacco: Never  Vaping Use   Vaping Use: Never used  Substance and Sexual Activity   Alcohol use: Yes    Comment: social   Drug use: No   Sexual activity: Yes  Other Topics Concern   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    Works in garden, owns 5 acres, [place] yards, very physically  active   Lives home with wife - daughter and 2 granddaughters live with him   Social Determinants of Health   Financial Resource Strain: Low Risk  (01/07/2022)   Overall Financial Resource Strain (CARDIA)    Difficulty of Paying Living Expenses: Not hard at all  Food Insecurity: No [place] (01/07/2022)   Hunger Vital Sign    Worried About Running Out of Food in the Last Year: Never true    [place] in the Last Year: Never true  Transportation Needs: No  Transportation Needs (01/07/2022)   PRAPARE - Hydrologist (Medical): No    Lack of Transportation (Non-Medical): No  Physical Activity: Sufficiently Active (01/07/2022)   Exercise Vital Sign    Days of Exercise per Week: 7 days    Minutes of Exercise per Session: 30 min  Stress: No Stress Concern Present (01/07/2022)   [place]    Feeling of Stress : Not at all  Social Connections: Moderately Isolated (01/07/2022)   Social Connection and Isolation Panel [NHANES]    Frequency of Communication with Friends and Family: More than three times a week    Frequency of Social Gatherings with Friends and Family: More than three times a week    Attends Religious Services: Never    Marine scientist or Organizations: No    Attends Music therapist: Never    Marital Status: Married    Tobacco Counseling Counseling given: Not Answered   Clinical Intake:  Pre-visit preparation completed: Yes  Pain : No/denies pain     BMI - recorded: 30.57 Nutritional Status: BMI > 30  Obese Nutritional Risks: None Diabetes: Yes CBG done?: No Did pt. bring in CBG monitor from home?: No  How often do you need to have someone help you when you read instructions, pamphlets, or other written materials from your doctor or pharmacy?: 1 - Never  Diabetic? Nutrition Risk Assessment:  Has the patient had any N/V/D within the last 2  months?  No  Does the patient have any non-healing wounds?  No  Has the patient had any unintentional weight loss or weight gain?  No   Diabetes:  Is the patient diabetic?  Yes  If diabetic, was a CBG obtained today?  No  Did the patient bring in their glucometer from home?  No  How often do you monitor your CBG's? 3x per week.   Financial Strains and Diabetes Management:  Are you having any financial strains with the device, your supplies or your medication? No .  Does the patient want to be seen by Chronic Care Management for management of their diabetes?  No  Would the patient like to be referred to a Nutritionist or for Diabetic Management?  No   Diabetic Exams:  Diabetic Eye Exam: Completed 05/24/2021.   Diabetic Foot Exam: Completed 12/24/2021. Pt has been advised about the importance in completing this exam.   Interpreter Needed?: No  Information entered by :: Wilmer Berryhill, LPN   Activities of Daily Living    01/07/2022    9:09 AM  In your present state of health, do you have any difficulty performing the following activities:  Hearing? 1  Comment wears hearing aids  Vision? 0  Difficulty concentrating or making decisions? 0  Walking or climbing stairs? 0  Dressing or bathing? 0  Doing errands, shopping? 0  Preparing Food and eating ? N  Using the Toilet? N  In the past six months, have you accidently leaked urine? Y  Comment mild  Do you have problems with loss of bowel control? N  Managing your Medications? N  Managing your Finances? N  Housekeeping or managing your Housekeeping? N    Patient Care Team: Claretta Fraise, MD as PCP - General (Family Medicine) Harlen Labs, MD as Referring Physician (Optometry) Myrlene Broker, MD as Attending Physician (Urology) Pyrtle, Lajuan Lines, MD as Consulting Physician (Gastroenterology) Newt Lukes, AUD (Audiology) Sydnee Levans,  MD as Referring Physician (Dermatology)  Indicate any recent Medical  Services you may have received from other than [facility] providers in the past year (date may be approximate).     Assessment:   This is a routine wellness examination for Roger Palmer.  Hearing/Vision screen Hearing Screening - Comments:: Moderate hearing difficulties - wears hearing aids - from [facility] - Newt Lukes, Audiologist Vision Screening - Comments:: Wears rx glasses - up to date with routine eye exams with [facility]  Dietary issues and exercise activities discussed: Current Exercise Habits: Home exercise routine, Type of exercise: walking;Other - see comments (gardening, working around the house), Time (Minutes): 30, Frequency (Times/Week): 7, Weekly Exercise (Minutes/Week): 210, Intensity: Mild, Exercise limited by: None identified   Goals Addressed             This Visit's Progress    Increase physical activity       Stay active and independent       Depression Screen    01/07/2022    9:08 AM 12/24/2021   10:07 AM 07/04/2021    8:14 AM 04/03/2021    8:07 AM 01/04/2021    9:07 AM 01/01/2021    8:14 AM 09/27/2020    8:53 AM  PHQ 2/9 Scores  PHQ - 2 Score 0 0 0 0 0 0 0    Fall Risk    01/07/2022    9:05 AM 12/24/2021   10:07 AM 07/04/2021    8:14 AM 04/03/2021    8:07 AM 01/04/2021    9:17 AM  Fall Risk   Falls in the past year? 0 0 0 0 0  Number falls in past yr: 0    0  Injury with Fall? 0    0  Risk for fall due to : No Fall Risks    No Fall Risks  Follow up Falls prevention discussed    Falls prevention discussed    FALL RISK PREVENTION PERTAINING TO THE HOME:  Any stairs in or around the home? Yes  If so, are there any without handrails? No  Home free of loose throw rugs in walkways, pet beds, electrical cords, etc? Yes  Adequate lighting in your home to reduce risk of falls? Yes   ASSISTIVE DEVICES UTILIZED TO PREVENT FALLS:  Life alert? No  Use of a cane, walker or w/c? No  Grab bars in the bathroom? Yes  Shower chair or bench in  shower? No  Elevated toilet seat or a handicapped toilet? Yes   TIMED UP AND GO:  Was the test performed? No . Telephonic visit  Cognitive Function:    06/11/2018    8:18 AM  MMSE - Mini Mental State Exam  Orientation to time 5  Orientation to Place 5  Registration 3  Attention/ Calculation 5  Recall 3  Language- name 2 objects 2  Language- repeat 1  Language- follow 3 step command 3  Language- read & follow direction 1  Write a sentence 1  Copy design 1  Total score 30        01/07/2022    9:12 AM  6CIT Screen  What Year? 0 points  What month? 0 points  What time? 0 points  Count back from 20 0 points  Months in reverse 2 points  Repeat phrase 2 points  Total Score 4 points    Immunizations Immunization History  Administered Date(s) Administered   Fluad Quad(high Dose 65+) 05/24/2019, 02/29/2020, 04/03/2021   Influenza Split  07/25/2012   Influenza, High Dose Seasonal PF 04/01/2016, 04/29/2017, 02/04/2018   Influenza,inj,Quad PF,6+ Mos 03/11/2013, 02/25/2014, 06/28/2015   Moderna SARS-COV2 Booster Vaccination 04/26/2020   Moderna Sars-Covid-2 Vaccination 07/08/2019, 08/06/2019   Pneumococcal Conjugate-13 04/01/2013   Pneumococcal Polysaccharide-23 07/25/2012, 05/24/2019   Tdap 07/04/2021   Zoster Recombinat (Shingrix) 01/01/2021, 04/03/2021   Zoster, Live 05/12/2012    TDAP status: Up to date  Flu Vaccine status: Up to date  Pneumococcal vaccine status: Up to date  Covid-19 vaccine status: Completed vaccines  Qualifies for Shingles Vaccine? Yes   Zostavax completed Yes   Shingrix Completed?: Yes  Screening Tests Health Maintenance  Topic Date Due   COVID-19 Vaccine (3 - Moderna risk series) 01/09/2022 (Originally 05/24/2020)   INFLUENZA VACCINE  08/11/2022 (Originally 12/11/2021)   OPHTHALMOLOGY EXAM  05/24/2022   HEMOGLOBIN A1C  06/26/2022   FOOT EXAM  12/25/2022   COLONOSCOPY (Pts 45-70yr Insurance coverage will need to be confirmed)   06/05/2026   TETANUS/TDAP  07/05/2031   Pneumonia Vaccine 71 Years old  Completed   Hepatitis C Screening  Completed   Zoster Vaccines- Shingrix  Completed   HPV VACCINES  Aged Out   COLON CANCER SCREENING ANNUAL FOBT  Discontinued    Health Maintenance  There are no preventive care reminders to display for this patient.  Colorectal cancer screening: Type of screening: Colonoscopy. Completed 06/05/2016. Repeat every 10 years  Lung Cancer Screening: (Low Dose CT Chest recommended if Age 72-80years, 30 pack-year currently smoking OR have quit w/in 15years.) does not qualify.  Additional Screening:  Hepatitis C Screening: does qualify; Completed 02/29/2020  Vision Screening: Recommended annual ophthalmology exams for early detection of glaucoma and other disorders of the eye. Is the patient up to date with their annual eye exam?  Yes  Who is the provider or what is the name of the office in which the patient attends annual eye exams? [facility] [place] is not established with a provider, would they like to be referred to a provider to establish care? No .   Dental Screening: Recommended annual dental exams for proper oral hygiene  Community Resource Referral / Chronic Care Management: CRR required this visit?  No   CCM required this visit?  No      Plan:     I have personally reviewed and noted the following in the patient's chart:   Medical and social history Use of alcohol, tobacco or illicit drugs  Current medications and supplements including opioid prescriptions. Patient is not currently taking opioid prescriptions. Functional ability and status Nutritional status Physical activity Advanced directives List of other physicians Hospitalizations, surgeries, and ER visits in previous 12 months Vitals Screenings to include cognitive, depression, and falls Referrals and appointments  In addition, I have reviewed and discussed with patient certain preventive  protocols, quality metrics, and best practice recommendations. A written personalized care plan for preventive services as well as general preventive health recommendations were provided to patient.     ASandrea Hammond LPN   [id number]  Nurse Notes: None

## 2022-01-07 NOTE — Patient Instructions (Signed)
[name] , Thank you for taking time to come for your Medicare Wellness Visit. I appreciate your ongoing commitment to your health goals. Please review the following plan we discussed and let me know if I can assist you in the future.   Screening recommendations/referrals: Colonoscopy: Done 06/05/2016 - Repeat in 10 years  Recommended yearly ophthalmology/optometry visit for glaucoma screening and checkup Recommended yearly dental visit for hygiene and checkup  Vaccinations: Influenza vaccine: Done 04/03/2021 - Repeat annually  Pneumococcal vaccine: Done  04/01/2013 & 05/24/2019 Tdap vaccine: Done 07/04/2021 - Repeat in 10 years  Shingles vaccine: Done 01/01/2021 & 04/03/2021    Covid-19: Done 07/08/2019, 08/06/2019, 04/26/2020 - consider booster in fall  Advanced directives: Please bring a copy of your health care power of attorney and living will to the office to be added to your chart at your convenience.   Conditions/risks identified: Aim for 30 minutes of exercise or brisk walking, 6-8 glasses of water, and 5 servings of fruits and vegetables each day.   Next appointment: Follow up in one year for your annual wellness visit.   Preventive Care 43 Years and Older, Male  Preventive care refers to lifestyle choices and visits with your health care provider that can promote health and wellness. What does preventive care include? A yearly physical exam. This is also called an annual well check. Dental exams once or twice a year. Routine eye exams. Ask your health care provider how often you should have your eyes checked. Personal lifestyle choices, including: Daily care of your teeth and gums. Regular physical activity. Eating a healthy diet. Avoiding tobacco and drug use. Limiting alcohol use. Practicing safe sex. Taking low doses of aspirin every day. Taking vitamin and mineral supplements as recommended by your health care provider. What happens during an annual well check? The  services and screenings done by your health care provider during your annual well check will depend on your age, overall health, lifestyle risk factors, and family history of disease. Counseling  Your health care provider may ask you questions about your: Alcohol use. Tobacco use. Drug use. Emotional well-being. Home and relationship well-being. Sexual activity. Eating habits. History of falls. Memory and ability to understand (cognition). Work and work Statistician. Screening  You may have the following tests or measurements: Height, weight, and BMI. Blood pressure. Lipid and cholesterol levels. These may be checked every 5 years, or more frequently if you are over 72 years old. Skin check. Lung cancer screening. You may have this screening every year starting at age 72 if you have a 30-pack-year history of smoking and currently smoke or have quit within the past 15 years. Fecal occult blood test (FOBT) of the stool. You may have this test every year starting at age 65. Flexible sigmoidoscopy or colonoscopy. You may have a sigmoidoscopy every 5 years or a colonoscopy every 10 years starting at age 63. Prostate cancer screening. Recommendations will vary depending on your family history and other risks. Hepatitis C blood test. Hepatitis B blood test. Sexually transmitted disease (STD) testing. Diabetes screening. This is done by checking your blood sugar (glucose) after you have not eaten for a while (fasting). You may have this done every 1-3 years. Abdominal aortic aneurysm (AAA) screening. You may need this if you are a current or former smoker. Osteoporosis. You may be screened starting at age 66 if you are at high risk. Talk with your health care provider about your test results, treatment options, and if necessary, the  need for more tests. Vaccines  Your health care provider may recommend certain vaccines, such as: Influenza vaccine. This is recommended every year. Tetanus,  diphtheria, and acellular pertussis (Tdap, Td) vaccine. You may need a Td booster every 10 years. Zoster vaccine. You may need this after age 39. Pneumococcal 13-valent conjugate (PCV13) vaccine. One dose is recommended after age 65. Pneumococcal polysaccharide (PPSV23) vaccine. One dose is recommended after age 72. Talk to your health care provider about which screenings and vaccines you need and how often you need them. This information is not intended to replace advice given to you by your health care provider. Make sure you discuss any questions you have with your health care provider. Document Released: 05/26/2015 Document Revised: 01/17/2016 Document Reviewed: 02/28/2015 Elsevier Interactive Patient Education  2017 [employer] Prevention in the Home Falls can cause injuries. They can happen to people of all ages. There are many things you can do to make your home safe and to help prevent falls. What can I do on the outside of my home? Regularly fix the edges of walkways and driveways and fix any cracks. Remove anything that might make you trip as you walk through a door, such as a raised step or threshold. Trim any bushes or trees on the path to your home. Use bright outdoor lighting. Clear any walking paths of anything that might make someone trip, such as rocks or tools. Regularly check to see if handrails are loose or broken. Make sure that both sides of any steps have handrails. Any raised decks and porches should have guardrails on the edges. Have any leaves, snow, or ice cleared regularly. Use sand or salt on walking paths during winter. Clean up any spills in your garage right away. This includes oil or grease spills. What can I do in the bathroom? Use night lights. Install grab bars by the toilet and in the tub and shower. Do not use towel bars as grab bars. Use non-skid mats or decals in the tub or shower. If you need to sit down in the shower, use a plastic,  non-slip stool. Keep the floor dry. Clean up any water that spills on the floor as soon as it happens. Remove soap buildup in the tub or shower regularly. Attach bath mats securely with double-sided non-slip rug tape. Do not have throw rugs and other things on the floor that can make you trip. What can I do in the bedroom? Use night lights. Make sure that you have a light by your bed that is easy to reach. Do not use any sheets or blankets that are too big for your bed. They should not hang down onto the floor. Have a firm chair that has side arms. You can use this for support while you get dressed. Do not have throw rugs and other things on the floor that can make you trip. What can I do in the kitchen? Clean up any spills right away. Avoid walking on wet floors. Keep items that you use a lot in easy-to-reach places. If you need to reach something above you, use a strong step stool that has a grab bar. Keep electrical cords out of the way. Do not use floor polish or wax that makes floors slippery. If you must use wax, use non-skid floor wax. Do not have throw rugs and other things on the floor that can make you trip. What can I do with my stairs? Do not leave any items on the  stairs. Make sure that there are handrails on both sides of the stairs and use them. Fix handrails that are broken or loose. Make sure that handrails are as long as the stairways. Check any carpeting to make sure that it is firmly attached to the stairs. Fix any carpet that is loose or worn. Avoid having throw rugs at the top or bottom of the stairs. If you do have throw rugs, attach them to the floor with carpet tape. Make sure that you have a light switch at the top of the stairs and the bottom of the stairs. If you do not have them, ask someone to add them for you. What else can I do to help prevent falls? Wear shoes that: Do not have high heels. Have rubber bottoms. Are comfortable and fit you well. Are closed  at the toe. Do not wear sandals. If you use a stepladder: Make sure that it is fully opened. Do not climb a closed stepladder. Make sure that both sides of the stepladder are locked into place. Ask someone to hold it for you, if possible. Clearly mark and make sure that you can see: Any grab bars or handrails. First and last steps. Where the edge of each step is. Use tools that help you move around (mobility aids) if they are needed. These include: Canes. Walkers. Scooters. Crutches. Turn on the lights when you go into a dark area. Replace any light bulbs as soon as they burn out. Set up your furniture so you have a clear path. Avoid moving your furniture around. If any of your floors are uneven, fix them. If there are any pets around you, be aware of where they are. Review your medicines with your doctor. Some medicines can make you feel dizzy. This can increase your chance of falling. Ask your doctor what other things that you can do to help prevent falls. This information is not intended to replace advice given to you by your health care provider. Make sure you discuss any questions you have with your health care provider. Document Released: 02/23/2009 Document Revised: 10/05/2015 Document Reviewed: 06/03/2014 Elsevier Interactive Patient Education  2017 [employer].

## 2022-01-21 ENCOUNTER — Other Ambulatory Visit: Payer: Self-pay | Admitting: Family Medicine

## 2022-01-21 DIAGNOSIS — E1140 Type 2 diabetes mellitus with diabetic neuropathy, unspecified: Secondary | ICD-10-CM

## 2022-03-11 ENCOUNTER — Other Ambulatory Visit: Payer: Self-pay | Admitting: Family Medicine

## 2022-03-11 DIAGNOSIS — K219 Gastro-esophageal reflux disease without esophagitis: Secondary | ICD-10-CM

## 2022-03-26 ENCOUNTER — Ambulatory Visit: Payer: Medicare Other | Admitting: Family Medicine

## 2022-04-01 ENCOUNTER — Ambulatory Visit (INDEPENDENT_AMBULATORY_CARE_PROVIDER_SITE_OTHER): Payer: Medicare Other | Admitting: Family Medicine

## 2022-04-01 ENCOUNTER — Encounter: Payer: Self-pay | Admitting: Family Medicine

## 2022-04-01 VITALS — BP 125/77 | HR 87 | Temp 98.0°F | Ht 69.0 in | Wt 207.8 lb

## 2022-04-01 DIAGNOSIS — E1159 Type 2 diabetes mellitus with other circulatory complications: Secondary | ICD-10-CM

## 2022-04-01 DIAGNOSIS — E785 Hyperlipidemia, unspecified: Secondary | ICD-10-CM

## 2022-04-01 DIAGNOSIS — E1140 Type 2 diabetes mellitus with diabetic neuropathy, unspecified: Secondary | ICD-10-CM | POA: Diagnosis not present

## 2022-04-01 DIAGNOSIS — I152 Hypertension secondary to endocrine disorders: Secondary | ICD-10-CM

## 2022-04-01 DIAGNOSIS — E1169 Type 2 diabetes mellitus with other specified complication: Secondary | ICD-10-CM

## 2022-04-01 LAB — BAYER DCA HB A1C WAIVED: HB A1C (BAYER DCA - WAIVED): 7.7 % — ABNORMAL HIGH (ref 4.8–5.6)

## 2022-04-01 NOTE — Progress Notes (Signed)
Subjective:  Patient ID: Roger Palmer,  male    DOB: May 23, 1950  Age: 71 y.o.    CC: Diabetes   HPI [name] presents for  follow-up of hypertension. Patient has no history of headache chest pain or shortness of breath or recent cough. Patient also denies symptoms of TIA such as numbness weakness lateralizing. Patient denies side effects from medication. States taking it regularly.  Patient also  in for follow-up of elevated cholesterol. Doing well without complaints on current medication. Denies side effects  including myalgia and arthralgia and nausea. Also in today for liver function testing. Currently no chest pain, shortness of breath or other cardiovascular related symptoms noted.  Follow-up of diabetes. Patient does check blood sugar at home. Readings run between 160 and 200Brought in log.  Patient denies symptoms such as excessive hunger or urinary frequency, excessive hunger, nausea No significant hypoglycemic spells noted. Medications reviewed. Pt reports taking them regularly. Pt. denies complication/adverse reaction today.    History [name] has a past medical history of Allergy, Back pain, Bee sting allergy, BPH (benign prostatic hypertrophy), Diabetes mellitus without complication ([place]), Gastritis, GERD (gastroesophageal reflux disease), Herniated disc, Hypercholesteremia, Hypertension, Nephrolithiasis, Obesity, PONV (postoperative nausea and vomiting), Reflux, and Vitamin D deficiency.   He has a past surgical history that includes Inguinal hernia repair; kidney stones; ORIF R elbow (07/12/2012); ORIF R wirst (07/12/2012); Open reduction internal fixation (orif) distal radial fracture (Right, 07/11/2012); ORIF radial fracture (Right, 07/11/2012); ORIF acetabular fracture (Right, 07/14/2012); Fracture surgery; and Eye surgery (Right).   His family history includes ADD / ADHD in his son; Anxiety disorder in his son; CAD in his paternal grandmother; Cancer in his  maternal grandmother and sister; Diabetes in his mother, sister, and sister; Heart attack in his maternal grandfather; Heart attack (age of onset: 48) in his mother; Hypertension in his father and mother; Nephrolithiasis in his brother; Stroke in his father.He reports that he has never smoked. He has never used smokeless tobacco. He reports current alcohol use. He reports that he does not use drugs.  Current Outpatient Medications on File Prior to Visit  Medication Sig Dispense Refill   atorvastatin (LIPITOR) 40 MG tablet TAKE ONE (1) TABLET BY MOUTH EVERY DAY 90 tablet 3   FARXIGA 10 MG TABS tablet TAKE 1 TABLET BY MOUTH EVERY MORNING BEFORE BREAKFAST 90 tablet 0   fenofibrate 160 MG tablet TAKE ONE (1) TABLET EACH DAY 90 tablet 3   finasteride (PROSCAR) 5 MG tablet TAKE ONE (1) TABLET EACH DAY 90 tablet 3   loratadine (CLARITIN) 10 MG tablet Take 1 tablet (10 mg total) by mouth as needed for allergies. 90 tablet 3   metFORMIN (GLUCOPHAGE-XR) 500 MG 24 hr tablet TAKE 1 TABLET BY MOUTH EVERY MORNING WITH BREAKFAST 90 tablet 3   omeprazole (PRILOSEC) 20 MG capsule TAKE ONE (1) CAPSULE EACH DAY 90 capsule 0   tamsulosin (FLOMAX) 0.4 MG CAPS capsule TAKE ONE (1) CAPSULE EACH DAY 90 capsule 3   valsartan-hydrochlorothiazide (DIOVAN-HCT) 320-25 MG tablet TAKE ONE (1) TABLET BY MOUTH EVERY DAY 90 tablet 2   Vitamin D, Ergocalciferol, (DRISDOL) 1.25 MG (50000 UNIT) CAPS capsule Take 1 capsule (50,000 Units total) by mouth every 7 (seven) days. 13 capsule 3   No current facility-administered medications on file prior to visit.    ROS Review of Systems  Constitutional:  Negative for fever.  Respiratory:  Negative for shortness of breath.   Cardiovascular:  Negative for chest pain.  Musculoskeletal:  Negative for arthralgias.  Skin:  Negative for rash.    Objective:  BP 125/77   Pulse 87   Temp 98 F (36.7 C)   Ht _0  (1.753 m)   Wt 207 lb 12.8 oz (94.3 kg)   SpO2 98%   BMI 30.69 kg/m    BP Readings from Last 3 Encounters:  04/01/22 125/77  12/24/21 113/68  07/04/21 123/72    Wt Readings from Last 3 Encounters:  04/01/22 207 lb 12.8 oz (94.3 kg)  01/07/22 207 lb (93.9 kg)  12/24/21 205 lb 9.6 oz (93.3 kg)     Physical Exam Vitals reviewed.  Constitutional:      Appearance: He is well-developed.  HENT:     Head: Normocephalic and atraumatic.     Right Ear: External ear normal.     Left Ear: External ear normal.     Mouth/Throat:     Pharynx: No oropharyngeal exudate or posterior oropharyngeal erythema.  Eyes:     Pupils: Pupils are equal, round, and reactive to light.  Cardiovascular:     Rate and Rhythm: Normal rate and regular rhythm.     Heart sounds: No murmur heard. Pulmonary:     Effort: No respiratory distress.     Breath sounds: Normal breath sounds.  Musculoskeletal:     Cervical back: Normal range of motion and neck supple.  Neurological:     Mental Status: He is alert and oriented to person, place, and time.     Diabetic Foot Exam - Simple   No data filed     Lab Results  Component Value Date   HGBA1C 7.4 (H) 12/24/2021   HGBA1C 7.9 (H) 07/04/2021   HGBA1C 8.4 (H) 04/03/2021    Assessment & Plan:   [name] was seen today for diabetes.  Diagnoses and all orders for this visit:  Type 2 diabetes mellitus with diabetic neuropathy, without long-term current use of insulin (HCC) -     CBC with Differential/Platelet -     CMP14+EGFR -     Lipid panel -     Bayer DCA Hb A1c Waived  Hyperlipidemia associated with type 2 diabetes mellitus ([place])  Hypertension associated with diabetes ([place])   I am having Abelardo L. Boulier [name] maintain his loratadine, Vitamin D (Ergocalciferol), atorvastatin, fenofibrate, finasteride, tamsulosin, metFORMIN, valsartan-hydrochlorothiazide, omeprazole, and [place].  No orders of the defined types were placed in this encounter.    Follow-up: Return in about 3 months (around 07/02/2022).  Claretta Fraise, M.D.

## 2022-04-02 LAB — CBC WITH DIFFERENTIAL/PLATELET
Basophils Absolute: 0.1 10*3/uL (ref 0.0–0.2)
Basos: 1 %
EOS (ABSOLUTE): 0.2 10*3/uL (ref 0.0–0.4)
Eos: 4 %
Hematocrit: 49.6 % (ref 37.5–51.0)
Hemoglobin: 16.3 g/dL (ref 13.0–17.7)
Immature Grans (Abs): 0 10*3/uL (ref 0.0–0.1)
Immature Granulocytes: 0 %
Lymphocytes Absolute: 1.5 10*3/uL (ref 0.7–3.1)
Lymphs: 26 %
MCH: 29.8 pg (ref 26.6–33.0)
MCHC: 32.9 g/dL (ref 31.5–35.7)
MCV: 91 fL (ref 79–97)
Monocytes Absolute: 0.4 10*3/uL (ref 0.1–0.9)
Monocytes: 8 %
Neutrophils Absolute: 3.5 10*3/uL (ref 1.4–7.0)
Neutrophils: 61 %
Platelets: 189 10*3/uL (ref 150–450)
RBC: 5.47 x10E6/uL (ref 4.14–5.80)
RDW: 12.2 % (ref 11.6–15.4)
WBC: 5.7 10*3/uL (ref 3.4–10.8)

## 2022-04-02 LAB — CMP14+EGFR
ALT: 19 IU/L (ref 0–44)
AST: 24 IU/L (ref 0–40)
Albumin/Globulin Ratio: 1.8 (ref 1.2–2.2)
Albumin: 4.4 g/dL (ref 3.8–4.8)
Alkaline Phosphatase: 50 IU/L (ref 44–121)
BUN/Creatinine Ratio: 13 (ref 10–24)
BUN: 16 mg/dL (ref 8–27)
Bilirubin Total: 0.5 mg/dL (ref 0.0–1.2)
CO2: 24 mmol/L (ref 20–29)
Calcium: 9.9 mg/dL (ref 8.6–10.2)
Chloride: 106 mmol/L (ref 96–106)
Creatinine, Ser: 1.25 mg/dL (ref 0.76–1.27)
Globulin, Total: 2.5 g/dL (ref 1.5–4.5)
Glucose: 147 mg/dL — ABNORMAL HIGH (ref 70–99)
Potassium: 4.4 mmol/L (ref 3.5–5.2)
Sodium: 147 mmol/L — ABNORMAL HIGH (ref 134–144)
Total Protein: 6.9 g/dL (ref 6.0–8.5)
eGFR: 61 mL/min/{1.73_m2} (ref >59)

## 2022-04-02 LAB — LIPID PANEL
Chol/HDL Ratio: 2.8 ratio (ref 0.0–5.0)
Cholesterol, Total: 130 mg/dL (ref 100–199)
HDL: 47 mg/dL (ref >39)
LDL Chol Calc (NIH): 68 mg/dL (ref 0–99)
Triglycerides: 78 mg/dL (ref 0–149)
VLDL Cholesterol Cal: 15 mg/dL (ref 5–40)

## 2022-04-02 NOTE — Progress Notes (Signed)
Hello Tzvi,  Your lab result is normal and/or stable.Some minor variations that are not significant are commonly marked abnormal, but do not represent any medical problem for you.  Best regards, Phat Dalton, M.D.

## 2022-04-17 ENCOUNTER — Other Ambulatory Visit: Payer: Self-pay | Admitting: Family Medicine

## 2022-05-29 ENCOUNTER — Other Ambulatory Visit: Payer: Self-pay | Admitting: Family Medicine

## 2022-05-29 DIAGNOSIS — K219 Gastro-esophageal reflux disease without esophagitis: Secondary | ICD-10-CM

## 2022-06-13 ENCOUNTER — Other Ambulatory Visit: Payer: Self-pay | Admitting: Family Medicine

## 2022-06-28 DIAGNOSIS — H43812 Vitreous degeneration, left eye: Secondary | ICD-10-CM | POA: Diagnosis not present

## 2022-06-28 DIAGNOSIS — H35033 Hypertensive retinopathy, bilateral: Secondary | ICD-10-CM | POA: Diagnosis not present

## 2022-07-02 ENCOUNTER — Ambulatory Visit (INDEPENDENT_AMBULATORY_CARE_PROVIDER_SITE_OTHER): Payer: Medicare Other | Admitting: Family Medicine

## 2022-07-02 ENCOUNTER — Encounter: Payer: Self-pay | Admitting: Family Medicine

## 2022-07-02 VITALS — BP 115/65 | HR 83 | Temp 98.0°F | Ht 69.0 in | Wt 209.8 lb

## 2022-07-02 DIAGNOSIS — E1159 Type 2 diabetes mellitus with other circulatory complications: Secondary | ICD-10-CM | POA: Diagnosis not present

## 2022-07-02 DIAGNOSIS — E1169 Type 2 diabetes mellitus with other specified complication: Secondary | ICD-10-CM

## 2022-07-02 DIAGNOSIS — I152 Hypertension secondary to endocrine disorders: Secondary | ICD-10-CM | POA: Diagnosis not present

## 2022-07-02 DIAGNOSIS — E1140 Type 2 diabetes mellitus with diabetic neuropathy, unspecified: Secondary | ICD-10-CM

## 2022-07-02 DIAGNOSIS — K219 Gastro-esophageal reflux disease without esophagitis: Secondary | ICD-10-CM

## 2022-07-02 DIAGNOSIS — E785 Hyperlipidemia, unspecified: Secondary | ICD-10-CM

## 2022-07-02 LAB — CBC WITH DIFFERENTIAL/PLATELET
Basophils Absolute: 0.1 10*3/uL (ref 0.0–0.2)
Basos: 1 %
EOS (ABSOLUTE): 0.2 10*3/uL (ref 0.0–0.4)
Eos: 2 %
Hematocrit: 48 % (ref 37.5–51.0)
Hemoglobin: 15.8 g/dL (ref 13.0–17.7)
Immature Grans (Abs): 0 10*3/uL (ref 0.0–0.1)
Immature Granulocytes: 0 %
Lymphocytes Absolute: 1.8 10*3/uL (ref 0.7–3.1)
Lymphs: 20 %
MCH: 30.3 pg (ref 26.6–33.0)
MCHC: 32.9 g/dL (ref 31.5–35.7)
MCV: 92 fL (ref 79–97)
Monocytes Absolute: 0.8 10*3/uL (ref 0.1–0.9)
Monocytes: 9 %
Neutrophils Absolute: 6 10*3/uL (ref 1.4–7.0)
Neutrophils: 68 %
Platelets: 201 10*3/uL (ref 150–450)
RBC: 5.22 x10E6/uL (ref 4.14–5.80)
RDW: 12.6 % (ref 11.6–15.4)
WBC: 8.8 10*3/uL (ref 3.4–10.8)

## 2022-07-02 LAB — CMP14+EGFR
ALT: 19 IU/L (ref 0–44)
AST: 20 IU/L (ref 0–40)
Albumin/Globulin Ratio: 1.8 (ref 1.2–2.2)
Albumin: 4.4 g/dL (ref 3.8–4.8)
Alkaline Phosphatase: 51 IU/L (ref 44–121)
BUN/Creatinine Ratio: 14 (ref 10–24)
BUN: 19 mg/dL (ref 8–27)
Bilirubin Total: 0.5 mg/dL (ref 0.0–1.2)
CO2: 23 mmol/L (ref 20–29)
Calcium: 10 mg/dL (ref 8.6–10.2)
Chloride: 106 mmol/L (ref 96–106)
Creatinine, Ser: 1.38 mg/dL — ABNORMAL HIGH (ref 0.76–1.27)
Globulin, Total: 2.4 g/dL (ref 1.5–4.5)
Glucose: 167 mg/dL — ABNORMAL HIGH (ref 70–99)
Potassium: 4.2 mmol/L (ref 3.5–5.2)
Sodium: 144 mmol/L (ref 134–144)
Total Protein: 6.8 g/dL (ref 6.0–8.5)
eGFR: 54 mL/min/{1.73_m2} — ABNORMAL LOW (ref >59)

## 2022-07-02 LAB — LIPID PANEL
Chol/HDL Ratio: 3.2 ratio (ref 0.0–5.0)
Cholesterol, Total: 153 mg/dL (ref 100–199)
HDL: 48 mg/dL (ref >39)
LDL Chol Calc (NIH): 89 mg/dL (ref 0–99)
Triglycerides: 85 mg/dL (ref 0–149)
VLDL Cholesterol Cal: 16 mg/dL (ref 5–40)

## 2022-07-02 LAB — BAYER DCA HB A1C WAIVED: HB A1C (BAYER DCA - WAIVED): 8.2 % — ABNORMAL HIGH (ref 4.8–5.6)

## 2022-07-02 MED ORDER — OMEPRAZOLE 20 MG PO CPDR: DELAYED_RELEASE_CAPSULE | ORAL | 2 refills | Status: DC

## 2022-07-02 MED ORDER — DAPAGLIFLOZIN PROPANEDIOL 10 MG PO TABS: 10.0000 mg | ORAL_TABLET | Freq: Every day | ORAL | 2 refills | Status: DC

## 2022-07-02 NOTE — Progress Notes (Signed)
Subjective:  Patient ID: Roger Palmer, male    DOB: December 22, 1950  Age: 72 y.o. MRN: [id number]  CC: Medical Management of Chronic Issues   HPI Roger Palmer presents for presents forFollow-up of diabetes. Patient checks blood sugar at home.   130-190 fasting and not checking postprandial Patient denies symptoms such as polyuria, polydipsia, excessive hunger, nausea No significant hypoglycemic spells noted. Medications reviewed. Pt reports taking them regularly without complication/adverse reaction being reported today.    Lab Results  Component Value Date   HGBA1C 7.7 (H) 04/01/2022   HGBA1C 7.4 (H) 12/24/2021   HGBA1C 7.9 (H) 07/04/2021         07/02/2022    8:11 AM 04/01/2022    9:34 AM 01/07/2022    9:08 AM  Depression screen PHQ 2/9  Decreased Interest 0 0 0  Down, Depressed, Hopeless 0 0 0  PHQ - 2 Score 0 0 0    History Roger Palmer has a past medical history of Allergy, Back pain, Bee sting allergy, BPH (benign prostatic hypertrophy), Diabetes mellitus without complication ([place]), Gastritis, GERD (gastroesophageal reflux disease), Herniated disc, Hypercholesteremia, Hypertension, Nephrolithiasis, Obesity, PONV (postoperative nausea and vomiting), Reflux, and Vitamin D deficiency.   He has a past surgical history that includes Inguinal hernia repair; kidney stones; ORIF R elbow (07/12/2012); ORIF R wirst (07/12/2012); Open reduction internal fixation (orif) distal radial fracture (Right, 07/11/2012); ORIF radial fracture (Right, 07/11/2012); ORIF acetabular fracture (Right, 07/14/2012); Fracture surgery; and Eye surgery (Right).   His family history includes ADD / ADHD in his son; Anxiety disorder in his son; CAD in his paternal grandmother; Cancer in his maternal grandmother and sister; Diabetes in his mother, sister, and sister; Heart attack in his maternal grandfather; Heart attack (age of onset: 94) in his mother; Hypertension in his father and mother; Nephrolithiasis in  his brother; Stroke in his father.He reports that he has never smoked. He has never used smokeless tobacco. He reports current alcohol use. He reports that he does not use drugs.    ROS Review of Systems  Constitutional:  Negative for fever.  Respiratory:  Negative for shortness of breath.   Cardiovascular:  Negative for chest pain.  Musculoskeletal:  Negative for arthralgias.  Skin:  Negative for rash.    Objective:  BP 115/65   Pulse 83   Temp 98 F (36.7 C)   Ht 5' 9"$  (1.753 m)   Wt 209 lb 12.8 oz (95.2 kg)   SpO2 97%   BMI 30.98 kg/m   BP Readings from Last 3 Encounters:  07/02/22 115/65  04/01/22 125/77  12/24/21 113/68    Wt Readings from Last 3 Encounters:  07/02/22 209 lb 12.8 oz (95.2 kg)  04/01/22 207 lb 12.8 oz (94.3 kg)  01/07/22 207 lb (93.9 kg)     Physical Exam Constitutional:      General: He is not in acute distress.    Appearance: He is well-developed.  HENT:     Head: Normocephalic and atraumatic.     Right Ear: External ear normal.     Left Ear: External ear normal.     Nose: Nose normal.  Eyes:     Conjunctiva/sclera: Conjunctivae normal.     Pupils: Pupils are equal, round, and reactive to light.  Cardiovascular:     Rate and Rhythm: Normal rate and regular rhythm.     Heart sounds: Normal heart sounds. No murmur heard. Pulmonary:     Effort: Pulmonary effort is normal. No respiratory  distress.     Breath sounds: Normal breath sounds. No wheezing or rales.  Abdominal:     Palpations: Abdomen is soft.     Tenderness: There is no abdominal tenderness.  Musculoskeletal:        General: Normal range of motion.     Cervical back: Normal range of motion and neck supple.  Skin:    General: Skin is warm and dry.  Neurological:     Mental Status: He is alert and oriented to person, place, and time.     Deep Tendon Reflexes: Reflexes are normal and symmetric.  Psychiatric:        Behavior: Behavior normal.        Thought Content: Thought  content normal.        Judgment: Judgment normal.       Assessment & Plan:   Roger Palmer was seen today for medical management of chronic issues.  Diagnoses and all orders for this visit:  Type 2 diabetes mellitus with diabetic neuropathy, without long-term current use of insulin (HCC) -     Bayer DCA Hb A1c Waived -     Microalbumin / creatinine urine ratio  Hyperlipidemia associated with type 2 diabetes mellitus (HCC) -     Lipid panel  Hypertension associated with diabetes ([place]) -     CBC with Differential/Platelet -     CMP14+EGFR  Gastroesophageal reflux disease without esophagitis -     omeprazole (PRILOSEC) 20 MG capsule; TAKE ONE (1) CAPSULE EACH DAY  Other orders -     dapagliflozin propanediol (FARXIGA) 10 MG TABS tablet; Take 1 tablet (10 mg total) by mouth daily.       I have changed Roger Palmer [name]'s dapagliflozin propanediol. I am also having him maintain his loratadine, atorvastatin, fenofibrate, finasteride, tamsulosin, metFORMIN, valsartan-hydrochlorothiazide, Vitamin D (Ergocalciferol), and omeprazole.  Allergies as of 07/02/2022   No Known Allergies      Medication List        Accurate as of July 02, 2022  8:48 AM. If you have any questions, ask your nurse or doctor.          atorvastatin 40 MG tablet Commonly known as: LIPITOR TAKE ONE (1) TABLET BY MOUTH EVERY DAY   dapagliflozin propanediol 10 MG Tabs tablet Commonly known as: Farxiga Take 1 tablet (10 mg total) by mouth daily. What changed: additional instructions Changed by: Roger Fraise, MD   fenofibrate 160 MG tablet TAKE ONE (1) TABLET EACH DAY   finasteride 5 MG tablet Commonly known as: PROSCAR TAKE ONE (1) TABLET EACH DAY   loratadine 10 MG tablet Commonly known as: CLARITIN Take 1 tablet (10 mg total) by mouth as needed for allergies.   metFORMIN 500 MG 24 hr tablet Commonly known as: GLUCOPHAGE-XR TAKE 1 TABLET BY MOUTH EVERY MORNING WITH BREAKFAST    omeprazole 20 MG capsule Commonly known as: PRILOSEC TAKE ONE (1) CAPSULE EACH DAY   tamsulosin 0.4 MG Caps capsule Commonly known as: FLOMAX TAKE ONE (1) CAPSULE EACH DAY   valsartan-hydrochlorothiazide 320-25 MG tablet Commonly known as: DIOVAN-HCT TAKE ONE (1) TABLET BY MOUTH EVERY DAY   Vitamin D (Ergocalciferol) 1.25 MG (50000 UNIT) Caps capsule Commonly known as: DRISDOL TAKE 1 CAPSULE BY MOUTH EVERY 7 DAYS       Reviewed exercise and diet. A1c 8.2 today. Discussed increasing metformin ifexercise & diet corrections don't bring A1c downn  Follow-up: Return in about 3 months (around 09/30/2022).  Roger Palmer, M.D.

## 2022-07-03 LAB — MICROALBUMIN / CREATININE URINE RATIO
Creatinine, Urine: 76.8 mg/dL
Microalb/Creat Ratio: <4 mg/g creat (ref 0–29)
Microalbumin, Urine: <3 ug/mL

## 2022-07-03 NOTE — Progress Notes (Signed)
Hello Suhaan,  Your lab result is normal and/or stable.Some minor variations that are not significant are commonly marked abnormal, but do not represent any medical problem for you.  Best regards, Claretta Fraise, M.D.

## 2022-07-19 DIAGNOSIS — K080 Exfoliation of teeth due to systemic causes: Secondary | ICD-10-CM | POA: Diagnosis not present

## 2022-09-19 ENCOUNTER — Telehealth: Payer: Self-pay | Admitting: Family Medicine

## 2022-09-19 NOTE — Telephone Encounter (Signed)
Roger Palmer scheduled for their annual wellness visit. Appointment made for 01/09/2023.  Thank you,  Judeth Cornfield,  AMB Clinical Support [facility] Direct Dial ??[phone]

## 2022-09-26 ENCOUNTER — Other Ambulatory Visit: Payer: Self-pay | Admitting: Family Medicine

## 2022-10-31 ENCOUNTER — Ambulatory Visit (INDEPENDENT_AMBULATORY_CARE_PROVIDER_SITE_OTHER): Payer: Medicare Other | Admitting: Family Medicine

## 2022-10-31 ENCOUNTER — Encounter: Payer: Self-pay | Admitting: Family Medicine

## 2022-10-31 VITALS — BP 123/72 | HR 78 | Temp 97.7°F | Ht 69.0 in | Wt 202.6 lb

## 2022-10-31 DIAGNOSIS — E1140 Type 2 diabetes mellitus with diabetic neuropathy, unspecified: Secondary | ICD-10-CM | POA: Diagnosis not present

## 2022-10-31 DIAGNOSIS — Z7984 Long term (current) use of oral hypoglycemic drugs: Secondary | ICD-10-CM

## 2022-10-31 DIAGNOSIS — N400 Enlarged prostate without lower urinary tract symptoms: Secondary | ICD-10-CM

## 2022-10-31 DIAGNOSIS — E1169 Type 2 diabetes mellitus with other specified complication: Secondary | ICD-10-CM | POA: Diagnosis not present

## 2022-10-31 DIAGNOSIS — E1159 Type 2 diabetes mellitus with other circulatory complications: Secondary | ICD-10-CM

## 2022-10-31 DIAGNOSIS — E785 Hyperlipidemia, unspecified: Secondary | ICD-10-CM

## 2022-10-31 DIAGNOSIS — I152 Hypertension secondary to endocrine disorders: Secondary | ICD-10-CM

## 2022-10-31 DIAGNOSIS — J310 Chronic rhinitis: Secondary | ICD-10-CM

## 2022-10-31 LAB — CMP14+EGFR
ALT: 16 IU/L (ref 0–44)
AST: 19 IU/L (ref 0–40)
Albumin: 4.1 g/dL (ref 3.8–4.8)
Alkaline Phosphatase: 45 IU/L (ref 44–121)
BUN/Creatinine Ratio: 15 (ref 10–24)
BUN: 19 mg/dL (ref 8–27)
Bilirubin Total: 0.6 mg/dL (ref 0.0–1.2)
CO2: 25 mmol/L (ref 20–29)
Calcium: 10.1 mg/dL (ref 8.6–10.2)
Chloride: 110 mmol/L — ABNORMAL HIGH (ref 96–106)
Creatinine, Ser: 1.25 mg/dL (ref 0.76–1.27)
Globulin, Total: 2.6 g/dL (ref 1.5–4.5)
Glucose: 138 mg/dL — ABNORMAL HIGH (ref 70–99)
Potassium: 4.3 mmol/L (ref 3.5–5.2)
Sodium: 146 mmol/L — ABNORMAL HIGH (ref 134–144)
Total Protein: 6.7 g/dL (ref 6.0–8.5)
eGFR: 61 mL/min/{1.73_m2} (ref >59)

## 2022-10-31 LAB — BAYER DCA HB A1C WAIVED: HB A1C (BAYER DCA - WAIVED): 7.6 % — ABNORMAL HIGH (ref 4.8–5.6)

## 2022-10-31 LAB — CBC WITH DIFFERENTIAL/PLATELET
Basophils Absolute: 0.1 10*3/uL (ref 0.0–0.2)
Basos: 2 %
EOS (ABSOLUTE): 0.2 10*3/uL (ref 0.0–0.4)
Eos: 3 %
Hematocrit: 47 % (ref 37.5–51.0)
Hemoglobin: 15.4 g/dL (ref 13.0–17.7)
Immature Grans (Abs): 0 10*3/uL (ref 0.0–0.1)
Immature Granulocytes: 0 %
Lymphocytes Absolute: 1.7 10*3/uL (ref 0.7–3.1)
Lymphs: 32 %
MCH: 30.4 pg (ref 26.6–33.0)
MCHC: 32.8 g/dL (ref 31.5–35.7)
MCV: 93 fL (ref 79–97)
Monocytes Absolute: 0.5 10*3/uL (ref 0.1–0.9)
Monocytes: 9 %
Neutrophils Absolute: 2.9 10*3/uL (ref 1.4–7.0)
Neutrophils: 54 %
Platelets: 174 10*3/uL (ref 150–450)
RBC: 5.06 x10E6/uL (ref 4.14–5.80)
RDW: 12.7 % (ref 11.6–15.4)
WBC: 5.3 10*3/uL (ref 3.4–10.8)

## 2022-10-31 LAB — LIPID PANEL
Chol/HDL Ratio: 2.8 ratio (ref 0.0–5.0)
Cholesterol, Total: 135 mg/dL (ref 100–199)
HDL: 49 mg/dL (ref >39)
LDL Chol Calc (NIH): 72 mg/dL (ref 0–99)
Triglycerides: 70 mg/dL (ref 0–149)
VLDL Cholesterol Cal: 14 mg/dL (ref 5–40)

## 2022-10-31 MED ORDER — METFORMIN HCL ER 500 MG PO TB24
ORAL_TABLET | ORAL | 3 refills | Status: DC
Start: 2022-10-31 — End: 2023-04-01

## 2022-10-31 MED ORDER — FENOFIBRATE 160 MG PO TABS: ORAL_TABLET | ORAL | 3 refills | Status: DC

## 2022-10-31 MED ORDER — FINASTERIDE 5 MG PO TABS: ORAL_TABLET | ORAL | 3 refills | Status: DC

## 2022-10-31 MED ORDER — VALSARTAN-HYDROCHLOROTHIAZIDE 320-25 MG PO TABS: 1.0000 | ORAL_TABLET | Freq: Every day | ORAL | 3 refills | Status: DC

## 2022-10-31 MED ORDER — ATORVASTATIN CALCIUM 40 MG PO TABS: ORAL_TABLET | ORAL | 3 refills | Status: DC

## 2022-10-31 MED ORDER — TAMSULOSIN HCL 0.4 MG PO CAPS: ORAL_CAPSULE | ORAL | 3 refills | Status: DC

## 2022-10-31 MED ORDER — LORATADINE 10 MG PO TABS
10.0000 mg | ORAL_TABLET | ORAL | 3 refills | Status: DC | PRN
Start: 2022-10-31 — End: 2023-10-09

## 2022-10-31 NOTE — Progress Notes (Signed)
Subjective:  Patient ID: Roger Palmer,  male    DOB: 1951/04/01  Age: 72 y.o.    CC: Medical Management of Chronic Issues 2  HPI Roger Palmer presents for  follow-up of hypertension. Patient has no history of headache chest pain or shortness of breath or recent cough. Patient also denies symptoms of TIA such as numbness weakness lateralizing. Patient denies side effects from medication. States taking it regularly.  Patient also  in for follow-up of elevated cholesterol. Doing well without complaints on current medication. Denies side effects  including myalgia and arthralgia and nausea. Also in today for liver function testing. Currently no chest pain, shortness of breath or other cardiovascular related symptoms noted.  Follow-up of diabetes. Patient does check blood sugar at home. Readings run between 130 fasting and 160-200 postprandial Patient denies symptoms such as excessive hunger or urinary frequency, excessive hunger, nausea No significant hypoglycemic spells noted. Medications reviewed. Pt reports taking them regularly. Pt. denies complication/adverse reaction today.    History Roger Palmer has a past medical history of Allergy, Back pain, Bee sting allergy, BPH (benign prostatic hypertrophy), Diabetes mellitus without complication (HCC), Gastritis, GERD (gastroesophageal reflux disease), Herniated disc, Hypercholesteremia, Hypertension, Nephrolithiasis, Obesity, PONV (postoperative nausea and vomiting), Reflux, and Vitamin D deficiency.   He has a past surgical history that includes Inguinal hernia repair; kidney stones; ORIF R elbow (07/12/2012); ORIF R wirst (07/12/2012); Open reduction internal fixation (orif) distal radial fracture (Right, 07/11/2012); ORIF radial fracture (Right, 07/11/2012); ORIF acetabular fracture (Right, 07/14/2012); Fracture surgery; and Eye surgery (Right).   His family history includes ADD / ADHD in his son; Anxiety disorder in his son; CAD in his  paternal grandmother; Cancer in his maternal grandmother and sister; Diabetes in his mother, sister, and sister; Heart attack in his maternal grandfather; Heart attack (age of onset: 88) in his mother; Hypertension in his father and mother; Nephrolithiasis in his brother; Stroke in his father.He reports that he has never smoked. He has never used smokeless tobacco. He reports current alcohol use. He reports that he does not use drugs.  Current Outpatient Medications on File Prior to Visit  Medication Sig Dispense Refill   dapagliflozin propanediol (FARXIGA) 10 MG TABS tablet Take 1 tablet (10 mg total) by mouth daily. 90 tablet 2   omeprazole (PRILOSEC) 20 MG capsule TAKE ONE (1) CAPSULE EACH DAY 90 capsule 2   Vitamin D, Ergocalciferol, (DRISDOL) 1.25 MG (50000 UNIT) CAPS capsule TAKE 1 CAPSULE BY MOUTH EVERY 7 DAYS 13 capsule 1   No current facility-administered medications on file prior to visit.    ROS Review of Systems  Constitutional:  Negative for fever.  Respiratory:  Negative for shortness of breath.   Cardiovascular:  Negative for chest pain.  Musculoskeletal:  Negative for arthralgias.  Skin:  Negative for rash.    Objective:  BP 123/72   Pulse 78   Temp 97.7 F (36.5 C)   Ht 5\' 9"  (1.753 m)   Wt 202 lb 9.6 oz (91.9 kg)   SpO2 97%   BMI 29.92 kg/m   BP Readings from Last 3 Encounters:  10/31/22 123/72  07/02/22 115/65  04/01/22 125/77    Wt Readings from Last 3 Encounters:  10/31/22 202 lb 9.6 oz (91.9 kg)  07/02/22 209 lb 12.8 oz (95.2 kg)  04/01/22 207 lb 12.8 oz (94.3 kg)     Physical Exam Vitals reviewed.  Constitutional:      Appearance: He is well-developed.  HENT:  Head: Normocephalic and atraumatic.     Right Ear: External ear normal.     Left Ear: External ear normal.     Mouth/Throat:     Pharynx: No oropharyngeal exudate or posterior oropharyngeal erythema.  Eyes:     Pupils: Pupils are equal, round, and reactive to light.   Cardiovascular:     Rate and Rhythm: Normal rate and regular rhythm.     Heart sounds: No murmur heard. Pulmonary:     Effort: No respiratory distress.     Breath sounds: Normal breath sounds.  Musculoskeletal:     Cervical back: Normal range of motion and neck supple.  Neurological:     Mental Status: He is alert and oriented to person, place, and time.     Diabetic Foot Exam - Simple   No data filed     Lab Results  Component Value Date   HGBA1C 8.2 (H) 07/02/2022   HGBA1C 7.7 (H) 04/01/2022   HGBA1C 7.4 (H) 12/24/2021    Assessment & Plan:   Roger Palmer was seen today for medical management of chronic issues.  Diagnoses and all orders for this visit:  Type 2 diabetes mellitus with diabetic neuropathy, without long-term current use of insulin (HCC) -     Bayer DCA Hb A1c Waived -     metFORMIN (GLUCOPHAGE-XR) 500 MG 24 hr tablet; TAKE 1 TABLET BY MOUTH EVERY MORNING WITH BREAKFAST  Hyperlipidemia associated with type 2 diabetes mellitus (HCC) -     Lipid panel -     atorvastatin (LIPITOR) 40 MG tablet; TAKE ONE (1) TABLET BY MOUTH EVERY DAY -     fenofibrate 160 MG tablet; TAKE ONE (1) TABLET EACH DAY  Hypertension associated with diabetes (HCC) -     CBC with Differential/Platelet -     CMP14+EGFR  Benign prostatic hyperplasia without lower urinary tract symptoms -     finasteride (PROSCAR) 5 MG tablet; TAKE ONE (1) TABLET EACH DAY -     tamsulosin (FLOMAX) 0.4 MG CAPS capsule; TAKE ONE (1) CAPSULE EACH DAY  Chronic rhinitis -     loratadine (CLARITIN) 10 MG tablet; Take 1 tablet (10 mg total) by mouth as needed for allergies.  Other orders -     valsartan-hydrochlorothiazide (DIOVAN-HCT) 320-25 MG tablet; Take 1 tablet by mouth daily.   I have changed Roger Palmer [name]'s valsartan-hydrochlorothiazide. I am also having him maintain his dapagliflozin propanediol, omeprazole, Vitamin D (Ergocalciferol), atorvastatin, fenofibrate, finasteride, loratadine,  metFORMIN, and tamsulosin.  Meds ordered this encounter  Medications   atorvastatin (LIPITOR) 40 MG tablet    Sig: TAKE ONE (1) TABLET BY MOUTH EVERY DAY    Dispense:  90 tablet    Refill:  3   fenofibrate 160 MG tablet    Sig: TAKE ONE (1) TABLET EACH DAY    Dispense:  90 tablet    Refill:  3   finasteride (PROSCAR) 5 MG tablet    Sig: TAKE ONE (1) TABLET EACH DAY    Dispense:  90 tablet    Refill:  3   loratadine (CLARITIN) 10 MG tablet    Sig: Take 1 tablet (10 mg total) by mouth as needed for allergies.    Dispense:  90 tablet    Refill:  3   metFORMIN (GLUCOPHAGE-XR) 500 MG 24 hr tablet    Sig: TAKE 1 TABLET BY MOUTH EVERY MORNING WITH BREAKFAST    Dispense:  90 tablet    Refill:  3  tamsulosin (FLOMAX) 0.4 MG CAPS capsule    Sig: TAKE ONE (1) CAPSULE EACH DAY    Dispense:  90 capsule    Refill:  3   valsartan-hydrochlorothiazide (DIOVAN-HCT) 320-25 MG tablet    Sig: Take 1 tablet by mouth daily.    Dispense:  90 tablet    Refill:  3     Follow-up: Return in about 3 months (around 01/31/2023) for diabetes.  Mechele Claude, M.D.

## 2023-01-06 ENCOUNTER — Other Ambulatory Visit: Payer: Self-pay | Admitting: Family Medicine

## 2023-01-09 ENCOUNTER — Ambulatory Visit (INDEPENDENT_AMBULATORY_CARE_PROVIDER_SITE_OTHER): Payer: Medicare Other

## 2023-01-09 VITALS — Ht 69.0 in | Wt 205.0 lb

## 2023-01-09 DIAGNOSIS — Z0000 Encounter for general adult medical examination without abnormal findings: Secondary | ICD-10-CM | POA: Diagnosis not present

## 2023-01-09 NOTE — Progress Notes (Signed)
Subjective:   Roger Palmer is a 72 y.o. male who presents for Medicare Annual/Subsequent preventive examination.  Visit Complete: Virtual  I connected with  Virgina Norfolk on 01/09/23 by a audio enabled telemedicine application and verified that I am speaking with the correct person using two identifiers.  Patient Location: Home  Provider Location: Home Office  I discussed the limitations of evaluation and management by telemedicine. The patient expressed understanding and agreed to proceed.  Patient Medicare AWV questionnaire was completed by the patient on 01/09/2023; I have confirmed that all information answered by patient is correct and no changes since this date.  Review of Systems    Vital Signs: Unable to obtain new vitals due to this being a telehealth visit.  Cardiac Risk Factors include: advanced age (>67men, >24 women);diabetes mellitus;dyslipidemia;male gender;hypertensionNutrition Risk Assessment:  Has the patient had any N/V/D within the last 2 months?  No  Does the patient have any non-healing wounds?  No  Has the patient had any unintentional weight loss or weight gain?  No   Diabetes:  Is the patient diabetic?  Yes  If diabetic, was a CBG obtained today?  No  Did the patient bring in their glucometer from home?  No  How often do you monitor your CBG's? Weekly .   Financial Strains and Diabetes Management:  Are you having any financial strains with the device, your supplies or your medication? No .  Does the patient want to be seen by Chronic Care Management for management of their diabetes?  No  Would the patient like to be referred to a Nutritionist or for Diabetic Management?  No   Diabetic Exams:  Diabetic Eye Exam: Completed 02/2022 Diabetic Foot Exam: Overdue, Pt has been advised about the importance in completing this exam. Pt is scheduled for diabetic foot exam on  .  Next office visit     Objective:    Today's Vitals   01/09/23 0932   Weight: 205 lb (93 kg)  Height: 5\' 9"  (1.753 m)   Body mass index is 30.27 kg/m.     01/09/2023    9:36 AM 01/07/2022    9:10 AM 01/04/2021    9:18 AM 06/11/2018    8:16 AM 06/05/2016   10:10 AM 07/23/2012    8:41 PM 07/14/2012   11:00 AM  Advanced Directives  Does Patient Have a Medical Advance Directive? Yes Yes Yes Yes Yes Patient does not have advance directive;Patient would like information Patient does not have advance directive  Type of Advance Directive Healthcare Power of [place];Living will Healthcare Power of [place];Living will Healthcare Power of [place];Living will Living will;Healthcare Power of Attorney     Copy of Healthcare Power of Attorney in Chart? No - copy requested No - copy requested No - copy requested No - copy requested     Would patient like information on creating a medical advance directive?      Advance directive packet given   Pre-existing out of facility DNR order (yellow form or pink MOST form)      No     Current Medications (verified) Outpatient Encounter Medications as of 01/09/2023  Medication Sig   atorvastatin (LIPITOR) 40 MG tablet TAKE ONE (1) TABLET BY MOUTH EVERY DAY   dapagliflozin propanediol (FARXIGA) 10 MG TABS tablet Take 1 tablet (10 mg total) by mouth daily.   fenofibrate 160 MG tablet TAKE ONE (1) TABLET EACH DAY   finasteride (PROSCAR) 5 MG tablet TAKE ONE (1)  TABLET EACH DAY   loratadine (CLARITIN) 10 MG tablet Take 1 tablet (10 mg total) by mouth as needed for allergies.   metFORMIN (GLUCOPHAGE-XR) 500 MG 24 hr tablet TAKE 1 TABLET BY MOUTH EVERY MORNING WITH BREAKFAST   omeprazole (PRILOSEC) 20 MG capsule TAKE ONE (1) CAPSULE EACH DAY   tamsulosin (FLOMAX) 0.4 MG CAPS capsule TAKE ONE (1) CAPSULE EACH DAY   valsartan-hydrochlorothiazide (DIOVAN-HCT) 320-25 MG tablet Take 1 tablet by mouth daily.   Vitamin D, Ergocalciferol, (DRISDOL) 1.25 MG (50000 UNIT) CAPS capsule TAKE 1 CAPSULE BY MOUTH EVERY 7 DAYS   No  facility-administered encounter medications on file as of 01/09/2023.    Allergies (verified) Patient has no known allergies.   History: Past Medical History:  Diagnosis Date   Allergy    seasonal    Back pain    Bee sting allergy    BPH (benign prostatic hypertrophy)    Diabetes mellitus without complication (HCC)    Gastritis    GERD (gastroesophageal reflux disease)    Herniated disc    c7-c7, c5-6, c3-4, c4-5   Hypercholesteremia    Hypertension    Nephrolithiasis    Obesity    PONV (postoperative nausea and vomiting)    Reflux    Vitamin D deficiency    Past Surgical History:  Procedure Laterality Date   EYE SURGERY Right    detached retina   FRACTURE SURGERY     right hip    INGUINAL HERNIA REPAIR     right   kidney stones     x2 Dr. Earlene Plater    OPEN REDUCTION INTERNAL FIXATION (ORIF) DISTAL RADIAL FRACTURE Right 07/11/2012   Procedure: OPEN REDUCTION INTERNAL FIXATION (ORIF) DISTAL RADIAL FRACTURE;  Surgeon: Marlowe Shores, MD;  Location: [facility] OR;  Service: Orthopedics;  Laterality: Right;   ORIF ACETABULAR FRACTURE Right 07/14/2012   Procedure: OPEN REDUCTION INTERNAL FIXATION (ORIF) ACETABULAR FRACTURE;  Surgeon: Budd Palmer, MD;  Location: [facility] OR;  Service: Orthopedics;  Laterality: Right;   ORIF R elbow  07/12/2012   ORIF R wirst  07/12/2012   ORIF RADIAL FRACTURE Right 07/11/2012   Procedure: OPEN REDUCTION INTERNAL FIXATION (ORIF) RADIAL head FRACTURE;  Surgeon: Marlowe Shores, MD;  Location: [facility] OR;  Service: Orthopedics;  Laterality: Right;   Family History  Problem Relation Age of Onset   Heart attack Mother 37   Diabetes Mother        NIDDM   Hypertension Mother    Hypertension Father    Stroke Father    Nephrolithiasis Brother    Cancer Sister        unsure - ? cervical    Diabetes Sister    Diabetes Sister    Anxiety disorder Son        depression    ADD / ADHD Son    Cancer Maternal Grandmother        unknown   Heart attack  Maternal Grandfather    CAD Paternal Grandmother    Social History   Socioeconomic History   Marital status: Married    Spouse name: Archie Patten   Number of children: 2   Years of education: Not on file   Highest education level: Not on file  Occupational History   Occupation: retired    Comment: [employer]  Tobacco Use   Smoking status: Never   Smokeless tobacco: Never  Vaping Use   Vaping status: Never Used  Substance and Sexual Activity   Alcohol use:  Yes    Comment: social   Drug use: No   Sexual activity: Yes  Other Topics Concern   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    Works in garden, owns 5 acres, mows 5 yards, very physically active   Lives home with wife - daughter and 2 granddaughters live with him   Social Determinants of Health   Financial Resource Strain: Low Risk  (01/09/2023)   Overall Financial Resource Strain (CARDIA)    Difficulty of Paying Living Expenses: Not hard at all  Food Insecurity: No Food Insecurity (01/09/2023)   Hunger Vital Sign    Worried About Running Out of Food in the Last Year: Never true    Ran Out of Food in the Last Year: Never true  Transportation Needs: No Transportation Needs (01/09/2023)   PRAPARE - Administrator, Civil Service (Medical): No    Lack of Transportation (Non-Medical): No  Physical Activity: Sufficiently Active (01/09/2023)   Exercise Vital Sign    Days of Exercise per Week: 5 days    Minutes of Exercise per Session: 30 min  Stress: No Stress Concern Present (01/07/2022)   [employer] of Occupational Health - Occupational Stress Questionnaire    Feeling of Stress : Not at all  Social Connections: Moderately Isolated (01/09/2023)   Social Connection and Isolation Panel [NHANES]    Frequency of Communication with Friends and Family: More than three times a week    Frequency of Social Gatherings with Friends and Family: More than three times a week     Attends Religious Services: Never    Database administrator or Organizations: No    Attends Engineer, structural: Never    Marital Status: Married    Tobacco Counseling Counseling given: Not Answered   Clinical Intake:  Pre-visit preparation completed: Yes  Pain : No/denies pain     Nutritional Risks: None Diabetes: No  How often do you need to have someone help you when you read instructions, pamphlets, or other written materials from your doctor or pharmacy?: 1 - Never  Interpreter Needed?: No  Information entered by :: Renie Ora, LPN   Activities of Daily Living    01/09/2023    9:36 AM  In your present state of health, do you have any difficulty performing the following activities:  Hearing? 0  Vision? 0  Difficulty concentrating or making decisions? 0  Walking or climbing stairs? 0  Dressing or bathing? 0  Doing errands, shopping? 0  Preparing Food and eating ? N  Using the Toilet? N  In the past six months, have you accidently leaked urine? N  Do you have problems with loss of bowel control? N  Managing your Medications? N  Managing your Finances? N  Housekeeping or managing your Housekeeping? N    Patient Care Team: Mechele Claude, MD as PCP - General (Family Medicine) Michaelle Copas, MD as Referring Physician (Optometry) Esaw Dace, MD as Attending Physician (Urology) Pyrtle, Carie Caddy, MD as Consulting Physician (Gastroenterology) Christinia Gully, AUD (Audiology) Bufford Buttner, MD as Referring Physician (Dermatology)  Indicate any recent Medical Services you may have received from other than [facility] providers in the past year (date may be approximate).     Assessment:   This is a routine wellness examination for Hikaru.  Hearing/Vision screen Vision Screening - Comments:: Wears rx glasses - up to date with routine eye exams  with  Dr .Conley Rolls  Dietary issues and exercise activities discussed:     Goals Addressed              This Visit's Progress    Increase physical activity   On track    Stay active and independent       Depression Screen    01/09/2023    9:35 AM 10/31/2022    8:34 AM 07/02/2022    8:11 AM 04/01/2022    9:34 AM 01/07/2022    9:08 AM 12/24/2021   10:07 AM 07/04/2021    8:14 AM  PHQ 2/9 Scores  PHQ - 2 Score 0 0 0 0 0 0 0    Fall Risk    01/09/2023    9:34 AM 10/31/2022    8:34 AM 07/02/2022    8:11 AM 04/01/2022    9:34 AM 01/07/2022    9:05 AM  Fall Risk   Falls in the past year? 0 0 0 0 0  Number falls in past yr: 0    0  Injury with Fall? 0    0  Risk for fall due to : No Fall Risks    No Fall Risks  Follow up Falls prevention discussed    Falls prevention discussed    MEDICARE RISK AT HOME: Medicare Risk at Home Any stairs in or around the home?: Yes If so, are there any without handrails?: No Home free of loose throw rugs in walkways, pet beds, electrical cords, etc?: Yes Adequate lighting in your home to reduce risk of falls?: Yes Life alert?: No Use of a cane, walker or w/c?: No Grab bars in the bathroom?: Yes Shower chair or bench in shower?: Yes Elevated toilet seat or a handicapped toilet?: Yes  TIMED UP AND GO:  Was the test performed?  No    Cognitive Function:    06/11/2018    8:18 AM  MMSE - Mini Mental State Exam  Orientation to time 5  Orientation to Place 5  Registration 3  Attention/ Calculation 5  Recall 3  Language- name 2 objects 2  Language- repeat 1  Language- follow 3 step command 3  Language- read & follow direction 1  Write a sentence 1  Copy design 1  Total score 30        01/09/2023    9:36 AM 01/07/2022    9:12 AM  6CIT Screen  What Year? 0 points 0 points  What month? 0 points 0 points  What time? 0 points 0 points  Count back from 20 0 points 0 points  Months in reverse 0 points 2 points  Repeat phrase 0 points 2 points  Total Score 0 points 4 points    Immunizations Immunization History  Administered  Date(s) Administered   Fluad Quad(high Dose 65+) 05/24/2019, 02/29/2020, 04/03/2021   Influenza Split 07/25/2012   Influenza, High Dose Seasonal PF 04/01/2016, 04/29/2017, 02/04/2018   Influenza,inj,Quad PF,6+ Mos 03/11/2013, 02/25/2014, 06/28/2015   Moderna SARS-COV2 Booster Vaccination 04/26/2020   Moderna Sars-Covid-2 Vaccination 07/08/2019, 08/06/2019   Pneumococcal Conjugate-13 04/01/2013   Pneumococcal Polysaccharide-23 07/25/2012, 05/24/2019   Tdap 07/04/2021   Zoster Recombinant(Shingrix) 01/01/2021, 04/03/2021   Zoster, Live 05/12/2012    TDAP status: Up to date  Flu Vaccine status: Up to date  Pneumococcal vaccine status: Up to date  Covid-19 vaccine status: Completed vaccines  Qualifies for Shingles Vaccine? Yes   Zostavax completed Yes   Shingrix Completed?: Yes  Screening Tests Health Maintenance  Topic Date Due   COVID-19 Vaccine (3 - Moderna risk series) 05/24/2020   OPHTHALMOLOGY EXAM  05/24/2022   INFLUENZA VACCINE  12/12/2022   FOOT EXAM  12/25/2022   HEMOGLOBIN A1C  05/02/2023   Diabetic kidney evaluation - Urine ACR  07/03/2023   Diabetic kidney evaluation - eGFR measurement  10/31/2023   Medicare Annual Wellness (AWV)  01/09/2024   Colonoscopy  06/05/2026   DTaP/Tdap/Td (2 - Td or Tdap) 07/05/2031   Pneumonia Vaccine 34+ Years old  Completed   Hepatitis C Screening  Completed   Zoster Vaccines- Shingrix  Completed   HPV VACCINES  Aged Out   COLON CANCER SCREENING ANNUAL FOBT  Discontinued    Health Maintenance  Health Maintenance Due  Topic Date Due   COVID-19 Vaccine (3 - Moderna risk series) 05/24/2020   OPHTHALMOLOGY EXAM  05/24/2022   INFLUENZA VACCINE  12/12/2022   FOOT EXAM  12/25/2022    Colorectal cancer screening: Type of screening: Colonoscopy. Completed 06/05/2016. Repeat every 10 years  Lung Cancer Screening: (Low Dose CT Chest recommended if Age 42-80 years, 20 pack-year currently smoking OR have quit w/in 15years.) does not  qualify.   Lung Cancer Screening Referral: n/a  Additional Screening:  Hepatitis C Screening: does not qualify; Completed 02/29/2020  Vision Screening: Recommended annual ophthalmology exams for early detection of glaucoma and other disorders of the eye. Is the patient up to date with their annual eye exam?  Yes  Who is the provider or what is the name of the office in which the patient attends annual eye exams? Dr.Le  If pt is not established with a provider, would they like to be referred to a provider to establish care? No .   Dental Screening: Recommended annual dental exams for proper oral hygiene   Community Resource Referral / Chronic Care Management: CRR required this visit?  No   CCM required this visit?  No     Plan:     I have personally reviewed and noted the following in the patient's chart:   Medical and social history Use of alcohol, tobacco or illicit drugs  Current medications and supplements including opioid prescriptions. Patient is not currently taking opioid prescriptions. Functional ability and status Nutritional status Physical activity Advanced directives List of other physicians Hospitalizations, surgeries, and ER visits in previous 12 months Vitals Screenings to include cognitive, depression, and falls Referrals and appointments  In addition, I have reviewed and discussed with patient certain preventive protocols, quality metrics, and best practice recommendations. A written personalized care plan for preventive services as well as general preventive health recommendations were provided to patient.     Lorrene Reid, LPN   08/19/8117   After Visit Summary: (MyChart) Due to this being a telephonic visit, the after visit summary with patients personalized plan was offered to patient via MyChart   Nurse Notes: none

## 2023-01-09 NOTE — Patient Instructions (Signed)
Roger Palmer , Thank you for taking time to come for your Medicare Wellness Visit. I appreciate your ongoing commitment to your health goals. Please review the following plan we discussed and let me know if I can assist you in the future.   Referrals/Orders/Follow-Ups/Clinician Recommendations: Aim for 30 minutes of exercise or brisk walking, 6-8 glasses of water, and 5 servings of fruits and vegetables each day.   This is a list of the screening recommended for you and due dates:  Health Maintenance  Topic Date Due   COVID-19 Vaccine (3 - Moderna risk series) 05/24/2020   Eye exam for diabetics  05/24/2022   Flu Shot  12/12/2022   Complete foot exam   12/25/2022   Hemoglobin A1C  05/02/2023   Yearly kidney health urinalysis for diabetes  07/03/2023   Yearly kidney function blood test for diabetes  10/31/2023   Medicare Annual Wellness Visit  01/09/2024   Colon Cancer Screening  06/05/2026   DTaP/Tdap/Td vaccine (2 - Td or Tdap) 07/05/2031   Pneumonia Vaccine  Completed   Hepatitis C Screening  Completed   Zoster (Shingles) Vaccine  Completed   HPV Vaccine  Aged Out   Stool Blood Test  Discontinued    Advanced directives: (Copy Requested) Please bring a copy of your health care power of attorney and living will to the office to be added to your chart at your convenience.  Next Medicare Annual Wellness Visit scheduled for next year: Yes  insert Preventive Care attachment Insert FALL PREVENTION attachment if needed

## 2023-01-24 DIAGNOSIS — K080 Exfoliation of teeth due to systemic causes: Secondary | ICD-10-CM | POA: Diagnosis not present

## 2023-03-03 DIAGNOSIS — L821 Other seborrheic keratosis: Secondary | ICD-10-CM | POA: Diagnosis not present

## 2023-03-03 DIAGNOSIS — D2271 Melanocytic nevi of right lower limb, including hip: Secondary | ICD-10-CM | POA: Diagnosis not present

## 2023-03-03 DIAGNOSIS — L570 Actinic keratosis: Secondary | ICD-10-CM | POA: Diagnosis not present

## 2023-03-03 DIAGNOSIS — Z85828 Personal history of other malignant neoplasm of skin: Secondary | ICD-10-CM | POA: Diagnosis not present

## 2023-03-03 DIAGNOSIS — D225 Melanocytic nevi of trunk: Secondary | ICD-10-CM | POA: Diagnosis not present

## 2023-03-03 DIAGNOSIS — H40033 Anatomical narrow angle, bilateral: Secondary | ICD-10-CM | POA: Diagnosis not present

## 2023-03-03 DIAGNOSIS — H524 Presbyopia: Secondary | ICD-10-CM | POA: Diagnosis not present

## 2023-03-03 DIAGNOSIS — H2513 Age-related nuclear cataract, bilateral: Secondary | ICD-10-CM | POA: Diagnosis not present

## 2023-03-05 ENCOUNTER — Ambulatory Visit: Payer: Medicare Other | Admitting: Family Medicine

## 2023-03-28 ENCOUNTER — Encounter: Payer: Self-pay | Admitting: Family Medicine

## 2023-04-01 ENCOUNTER — Encounter: Payer: Self-pay | Admitting: Family Medicine

## 2023-04-01 ENCOUNTER — Ambulatory Visit (INDEPENDENT_AMBULATORY_CARE_PROVIDER_SITE_OTHER): Payer: Medicare Other | Admitting: Family Medicine

## 2023-04-01 VITALS — BP 115/71 | HR 73 | Temp 97.5°F | Ht 69.0 in | Wt 206.8 lb

## 2023-04-01 DIAGNOSIS — E1140 Type 2 diabetes mellitus with diabetic neuropathy, unspecified: Secondary | ICD-10-CM

## 2023-04-01 DIAGNOSIS — E785 Hyperlipidemia, unspecified: Secondary | ICD-10-CM | POA: Diagnosis not present

## 2023-04-01 DIAGNOSIS — E1159 Type 2 diabetes mellitus with other circulatory complications: Secondary | ICD-10-CM

## 2023-04-01 DIAGNOSIS — N400 Enlarged prostate without lower urinary tract symptoms: Secondary | ICD-10-CM

## 2023-04-01 DIAGNOSIS — Z7984 Long term (current) use of oral hypoglycemic drugs: Secondary | ICD-10-CM

## 2023-04-01 DIAGNOSIS — I152 Hypertension secondary to endocrine disorders: Secondary | ICD-10-CM

## 2023-04-01 DIAGNOSIS — E1169 Type 2 diabetes mellitus with other specified complication: Secondary | ICD-10-CM | POA: Diagnosis not present

## 2023-04-01 LAB — BAYER DCA HB A1C WAIVED: HB A1C (BAYER DCA - WAIVED): 8.1 % — ABNORMAL HIGH (ref 4.8–5.6)

## 2023-04-01 MED ORDER — TAMSULOSIN HCL 0.4 MG PO CAPS
0.8000 mg | ORAL_CAPSULE | Freq: Every day | ORAL | 3 refills | Status: DC
Start: 2023-04-01 — End: 2024-02-11

## 2023-04-01 MED ORDER — DAPAGLIFLOZIN PROPANEDIOL 10 MG PO TABS: 10.0000 mg | ORAL_TABLET | Freq: Every day | ORAL | 3 refills | Status: DC

## 2023-04-01 MED ORDER — METFORMIN HCL ER 500 MG PO TB24
500.0000 mg | ORAL_TABLET | Freq: Two times a day (BID) | ORAL | 3 refills | Status: DC
Start: 2023-04-01 — End: 2023-12-24

## 2023-04-01 NOTE — Progress Notes (Signed)
Subjective:  Patient ID: Roger Palmer, male    DOB: [birth year]  Age: 72 y.o. MRN: [id number]  CC: Medical Management of Chronic Issues   HPI Roger Palmer presents for presents forFollow-up of diabetes. Patient checks blood sugar at home occasionally.   150-190 fasting and not checking postprandial Patient denies symptoms such as polyuria, polydipsia, excessive hunger, nausea No significant hypoglycemic spells noted. Medications reviewed. Pt reports taking them regularly without complication/adverse reaction being reported today.  Lab Results  Component Value Date   HGBA1C 8.1 (H) 04/01/2023   HGBA1C 7.6 (H) 10/31/2022   HGBA1C 8.2 (H) 07/02/2022     presents for  follow-up of hypertension. Patient has no history of headache chest pain or shortness of breath or recent cough. Patient also denies symptoms of TIA such as focal numbness or weakness. Patient denies side effects from medication. States taking it regularly. Home readings 130's/80's     04/01/2023   11:12 AM 01/09/2023    9:35 AM 10/31/2022    8:34 AM  Depression screen PHQ 2/9  Decreased Interest 0 0 0  Down, Depressed, Hopeless 0 0 0  PHQ - 2 Score 0 0 0    History Roger Palmer has a past medical history of Allergy, Back pain, Bee sting allergy, BPH (benign prostatic hypertrophy), Diabetes mellitus without complication (HCC), Gastritis, GERD (gastroesophageal reflux disease), Herniated disc, Hypercholesteremia, Hypertension, Nephrolithiasis, Obesity, PONV (postoperative nausea and vomiting), Reflux, and Vitamin D deficiency.   He has a past surgical history that includes Inguinal hernia repair; kidney stones; ORIF R elbow (07/12/2012); ORIF R wirst (07/12/2012); Open reduction internal fixation (orif) distal radial fracture (Right, 07/11/2012); ORIF radial fracture (Right, 07/11/2012); ORIF acetabular fracture (Right, 07/14/2012); Fracture surgery; and Eye surgery (Right).   His family history includes ADD / ADHD in  his son; Anxiety disorder in his son; CAD in his paternal grandmother; Cancer in his maternal grandmother and sister; Diabetes in his mother, sister, and sister; Heart attack in his maternal grandfather; Heart attack (age of onset: 74) in his mother; Hypertension in his father and mother; Nephrolithiasis in his brother; Stroke in his father.He reports that he has never smoked. He has never used smokeless tobacco. He reports current alcohol use. He reports that he does not use drugs.    ROS Review of Systems  Genitourinary:  Positive for frequency.    Objective:  BP 115/71   Pulse 73   Temp (!) 97.5 F (36.4 C)   Ht 5\' 9"  (1.753 m)   Wt 206 lb 12.8 oz (93.8 kg)   SpO2 98%   BMI 30.54 kg/m   BP Readings from Last 3 Encounters:  04/01/23 115/71  10/31/22 123/72  07/02/22 115/65    Wt Readings from Last 3 Encounters:  04/01/23 206 lb 12.8 oz (93.8 kg)  01/09/23 205 lb (93 kg)  10/31/22 202 lb 9.6 oz (91.9 kg)     Physical Exam Vitals reviewed.  Constitutional:      Appearance: He is well-developed.  HENT:     Head: Normocephalic and atraumatic.     Right Ear: External ear normal.     Left Ear: External ear normal.     Mouth/Throat:     Pharynx: No oropharyngeal exudate or posterior oropharyngeal erythema.  Eyes:     Pupils: Pupils are equal, round, and reactive to light.  Cardiovascular:     Rate and Rhythm: Normal rate and regular rhythm.     Heart sounds: No murmur heard. Pulmonary:  Effort: No respiratory distress.     Breath sounds: Normal breath sounds.  Musculoskeletal:     Cervical back: Normal range of motion and neck supple.  Neurological:     Mental Status: He is alert and oriented to person, place, and time.       Assessment & Plan:   Roger Palmer" was seen today for medical management of chronic issues.  Diagnoses and all orders for this visit:  Type 2 diabetes mellitus with diabetic neuropathy, without long-term current use of insulin  (HCC) -     Bayer DCA Hb A1c Waived -     metFORMIN (GLUCOPHAGE-XR) 500 MG 24 hr tablet; Take 1 tablet (500 mg total) by mouth 2 (two) times daily with a meal.  Hyperlipidemia associated with type 2 diabetes mellitus (HCC) -     Lipid panel  Hypertension associated with diabetes (HCC) -     CBC with Differential/Platelet -     CMP14+EGFR  Benign prostatic hyperplasia without lower urinary tract symptoms -     tamsulosin (FLOMAX) 0.4 MG CAPS capsule; Take 2 capsules (0.8 mg total) by mouth daily after supper. TAKE ONE (1) CAPSULE EACH DAY  Other orders -     dapagliflozin propanediol (FARXIGA) 10 MG TABS tablet; Take 1 tablet (10 mg total) by mouth daily.       I have changed Roger L. Finks [name]'s metFORMIN and tamsulosin. I am also having him maintain his omeprazole, atorvastatin, fenofibrate, finasteride, loratadine, valsartan-hydrochlorothiazide, Vitamin D (Ergocalciferol), and dapagliflozin propanediol.  Allergies as of 04/01/2023   No Known Allergies      Medication List        Accurate as of April 01, 2023  2:41 PM. If you have any questions, ask your nurse or doctor.          atorvastatin 40 MG tablet Commonly known as: LIPITOR TAKE ONE (1) TABLET BY MOUTH EVERY DAY   dapagliflozin propanediol 10 MG Tabs tablet Commonly known as: Farxiga Take 1 tablet (10 mg total) by mouth daily.   fenofibrate 160 MG tablet TAKE ONE (1) TABLET EACH DAY   finasteride 5 MG tablet Commonly known as: PROSCAR TAKE ONE (1) TABLET EACH DAY   loratadine 10 MG tablet Commonly known as: CLARITIN Take 1 tablet (10 mg total) by mouth as needed for allergies.   metFORMIN 500 MG 24 hr tablet Commonly known as: GLUCOPHAGE-XR Take 1 tablet (500 mg total) by mouth 2 (two) times daily with a meal. What changed:  how much to take how to take this when to take this additional instructions Changed by: Dalal Livengood   omeprazole 20 MG capsule Commonly known as:  PRILOSEC TAKE ONE (1) CAPSULE EACH DAY   tamsulosin 0.4 MG Caps capsule Commonly known as: FLOMAX Take 2 capsules (0.8 mg total) by mouth daily after supper. TAKE ONE (1) CAPSULE EACH DAY What changed:  how much to take how to take this when to take this Changed by: Naomy Esham   valsartan-hydrochlorothiazide 320-25 MG tablet Commonly known as: DIOVAN-HCT Take 1 tablet by mouth daily.   Vitamin D (Ergocalciferol) 1.25 MG (50000 UNIT) Caps capsule Commonly known as: DRISDOL TAKE 1 CAPSULE BY MOUTH EVERY 7 DAYS         Follow-up: Return in about 3 months (around 07/02/2023).  Mechele Claude, M.D.

## 2023-04-02 LAB — LIPID PANEL
Chol/HDL Ratio: 3.2 ratio (ref 0.0–5.0)
Cholesterol, Total: 137 mg/dL (ref 100–199)
HDL: 43 mg/dL (ref >39)
LDL Chol Calc (NIH): 79 mg/dL (ref 0–99)
Triglycerides: 74 mg/dL (ref 0–149)
VLDL Cholesterol Cal: 15 mg/dL (ref 5–40)

## 2023-04-02 LAB — CBC WITH DIFFERENTIAL/PLATELET
Basophils Absolute: 0.1 10*3/uL (ref 0.0–0.2)
Basos: 1 %
EOS (ABSOLUTE): 0.2 10*3/uL (ref 0.0–0.4)
Eos: 3 %
Hematocrit: 46.4 % (ref 37.5–51.0)
Hemoglobin: 15.4 g/dL (ref 13.0–17.7)
Immature Grans (Abs): 0 10*3/uL (ref 0.0–0.1)
Immature Granulocytes: 0 %
Lymphocytes Absolute: 1.8 10*3/uL (ref 0.7–3.1)
Lymphs: 31 %
MCH: 30.5 pg (ref 26.6–33.0)
MCHC: 33.2 g/dL (ref 31.5–35.7)
MCV: 92 fL (ref 79–97)
Monocytes Absolute: 0.5 10*3/uL (ref 0.1–0.9)
Monocytes: 8 %
Neutrophils Absolute: 3.3 10*3/uL (ref 1.4–7.0)
Neutrophils: 57 %
Platelets: 194 10*3/uL (ref 150–450)
RBC: 5.05 x10E6/uL (ref 4.14–5.80)
RDW: 11.9 % (ref 11.6–15.4)
WBC: 5.7 10*3/uL (ref 3.4–10.8)

## 2023-04-02 LAB — CMP14+EGFR
ALT: 18 IU/L (ref 0–44)
AST: 20 [IU]/L (ref 0–40)
Albumin: 4.2 g/dL (ref 3.8–4.8)
Alkaline Phosphatase: 54 [IU]/L (ref 44–121)
BUN/Creatinine Ratio: 16 (ref 10–24)
BUN: 19 mg/dL (ref 8–27)
Bilirubin Total: 0.5 mg/dL (ref 0.0–1.2)
CO2: 22 mmol/L (ref 20–29)
Calcium: 9.8 mg/dL (ref 8.6–10.2)
Chloride: 107 mmol/L — ABNORMAL HIGH (ref 96–106)
Creatinine, Ser: 1.17 mg/dL (ref 0.76–1.27)
Globulin, Total: 2.6 g/dL (ref 1.5–4.5)
Glucose: 137 mg/dL — ABNORMAL HIGH (ref 70–99)
Potassium: 4.6 mmol/L (ref 3.5–5.2)
Sodium: 145 mmol/L — ABNORMAL HIGH (ref 134–144)
Total Protein: 6.8 g/dL (ref 6.0–8.5)
eGFR: 66 mL/min/{1.73_m2} (ref >59)

## 2023-04-02 NOTE — Progress Notes (Signed)
Hello Roger Palmer,  Your lab result is normal and/or stable.Some minor variations that are not significant are commonly marked abnormal, but do not represent any medical problem for you.  Best regards, Kenidee Cregan, M.D.

## 2023-04-26 ENCOUNTER — Other Ambulatory Visit: Payer: Self-pay | Admitting: Family Medicine

## 2023-05-22 ENCOUNTER — Other Ambulatory Visit: Payer: Self-pay | Admitting: Family Medicine

## 2023-05-22 DIAGNOSIS — K219 Gastro-esophageal reflux disease without esophagitis: Secondary | ICD-10-CM

## 2023-06-30 DIAGNOSIS — Q141 Congenital malformation of retina: Secondary | ICD-10-CM | POA: Diagnosis not present

## 2023-06-30 DIAGNOSIS — H43812 Vitreous degeneration, left eye: Secondary | ICD-10-CM | POA: Diagnosis not present

## 2023-06-30 DIAGNOSIS — H35362 Drusen (degenerative) of macula, left eye: Secondary | ICD-10-CM | POA: Diagnosis not present

## 2023-06-30 DIAGNOSIS — Z8669 Personal history of other diseases of the nervous system and sense organs: Secondary | ICD-10-CM | POA: Diagnosis not present

## 2023-06-30 DIAGNOSIS — H35033 Hypertensive retinopathy, bilateral: Secondary | ICD-10-CM | POA: Diagnosis not present

## 2023-07-03 ENCOUNTER — Ambulatory Visit: Payer: Medicare Other | Admitting: Family Medicine

## 2023-07-10 ENCOUNTER — Ambulatory Visit (INDEPENDENT_AMBULATORY_CARE_PROVIDER_SITE_OTHER): Payer: Medicare Other | Admitting: Family Medicine

## 2023-07-10 ENCOUNTER — Encounter: Payer: Self-pay | Admitting: Family Medicine

## 2023-07-10 VITALS — BP 123/69 | HR 87 | Temp 98.4°F | Ht 69.0 in | Wt 206.0 lb

## 2023-07-10 DIAGNOSIS — E785 Hyperlipidemia, unspecified: Secondary | ICD-10-CM

## 2023-07-10 DIAGNOSIS — E1169 Type 2 diabetes mellitus with other specified complication: Secondary | ICD-10-CM | POA: Diagnosis not present

## 2023-07-10 DIAGNOSIS — E1159 Type 2 diabetes mellitus with other circulatory complications: Secondary | ICD-10-CM

## 2023-07-10 DIAGNOSIS — I152 Hypertension secondary to endocrine disorders: Secondary | ICD-10-CM | POA: Diagnosis not present

## 2023-07-10 DIAGNOSIS — Z7984 Long term (current) use of oral hypoglycemic drugs: Secondary | ICD-10-CM

## 2023-07-10 DIAGNOSIS — K219 Gastro-esophageal reflux disease without esophagitis: Secondary | ICD-10-CM | POA: Diagnosis not present

## 2023-07-10 DIAGNOSIS — E1140 Type 2 diabetes mellitus with diabetic neuropathy, unspecified: Secondary | ICD-10-CM

## 2023-07-10 LAB — BAYER DCA HB A1C WAIVED: HB A1C (BAYER DCA - WAIVED): 7.5 % — ABNORMAL HIGH (ref 4.8–5.6)

## 2023-07-10 MED ORDER — OMEPRAZOLE 20 MG PO CPDR: DELAYED_RELEASE_CAPSULE | ORAL | 3 refills | Status: AC

## 2023-07-10 MED ORDER — PIOGLITAZONE HCL 30 MG PO TABS: 30.0000 mg | ORAL_TABLET | Freq: Every day | ORAL | 3 refills | Status: DC

## 2023-07-10 NOTE — Progress Notes (Signed)
 Subjective:  Patient ID: Roger Palmer, male    DOB: [birth year]  Age: 73 y.o. MRN: [id number]  CC: Medical Management of Chronic Issues (3 month follow up )   HPI Roger Palmer presents forFollow-up of diabetes. Patient checks blood sugar at home.   150-200 fasting and  postprandial Patient denies symptoms such as polyuria, polydipsia, excessive hunger, nausea No significant hypoglycemic spells noted. Medications reviewed. Pt reports taking them regularly without complication/adverse reaction being reported today.   Slight tingle on the left foot for a year. Old injury 2014, fell out of tree. Numbness tingling right foot ever since. Declines medicine for either one.   History Roger Palmer has a past medical history of Allergy, Back pain, Bee sting allergy, BPH (benign prostatic hypertrophy), Diabetes mellitus without complication (HCC), Gastritis, GERD (gastroesophageal reflux disease), Herniated disc, Hypercholesteremia, Hypertension, Nephrolithiasis, Obesity, PONV (postoperative nausea and vomiting), Reflux, and Vitamin D deficiency.   He has a past surgical history that includes Inguinal hernia repair; kidney stones; ORIF R elbow (07/12/2012); ORIF R wirst (07/12/2012); Open reduction internal fixation (orif) distal radial fracture (Right, 07/11/2012); ORIF radial fracture (Right, 07/11/2012); ORIF acetabular fracture (Right, 07/14/2012); Fracture surgery; and Eye surgery (Right).   His family history includes ADD / ADHD in his son; Anxiety disorder in his son; CAD in his paternal grandmother; Cancer in his maternal grandmother and sister; Diabetes in his mother, sister, and sister; Heart attack in his maternal grandfather; Heart attack (age of onset: 40) in his mother; Hypertension in his father and mother; Nephrolithiasis in his brother; Stroke in his father.He reports that he has never smoked. He has never used smokeless tobacco. He reports current alcohol use. He reports that he does not  use drugs.  Current Outpatient Medications on File Prior to Visit  Medication Sig Dispense Refill   atorvastatin (LIPITOR) 40 MG tablet TAKE ONE (1) TABLET BY MOUTH EVERY DAY 90 tablet 3   fenofibrate 160 MG tablet TAKE ONE (1) TABLET EACH DAY 90 tablet 3   finasteride (PROSCAR) 5 MG tablet TAKE ONE (1) TABLET EACH DAY 90 tablet 3   loratadine (CLARITIN) 10 MG tablet Take 1 tablet (10 mg total) by mouth as needed for allergies. 90 tablet 3   metFORMIN (GLUCOPHAGE-XR) 500 MG 24 hr tablet Take 1 tablet (500 mg total) by mouth 2 (two) times daily with a meal. 180 tablet 3   tamsulosin (FLOMAX) 0.4 MG CAPS capsule Take 2 capsules (0.8 mg total) by mouth daily after supper. TAKE ONE (1) CAPSULE EACH DAY 180 capsule 3   valsartan-hydrochlorothiazide (DIOVAN-HCT) 320-25 MG tablet Take 1 tablet by mouth daily. 90 tablet 3   Vitamin D, Ergocalciferol, (DRISDOL) 1.25 MG (50000 UNIT) CAPS capsule TAKE 1 CAPSULE BY MOUTH EVERY 7 DAYS 13 capsule 0   No current facility-administered medications on file prior to visit.    ROS Review of Systems  Constitutional:  Negative for fever.  Respiratory:  Negative for shortness of breath.   Cardiovascular:  Negative for chest pain.  Musculoskeletal:  Negative for arthralgias.  Skin:  Negative for rash.    Objective:  BP 123/69   Pulse 87   Temp 98.4 F (36.9 C)   Ht 5\' 9"  (1.753 m)   Wt 206 lb (93.4 kg)   SpO2 98%   BMI 30.42 kg/m   BP Readings from Last 3 Encounters:  07/10/23 123/69  04/01/23 115/71  10/31/22 123/72    Wt Readings from Last 3 Encounters:  07/10/23 206 lb (  93.4 kg)  04/01/23 206 lb 12.8 oz (93.8 kg)  01/09/23 205 lb (93 kg)     Physical Exam Vitals reviewed.  Constitutional:      Appearance: He is well-developed.  HENT:     Head: Normocephalic and atraumatic.     Right Ear: External ear normal.     Left Ear: External ear normal.     Mouth/Throat:     Pharynx: No oropharyngeal exudate or posterior oropharyngeal  erythema.  Eyes:     Pupils: Pupils are equal, round, and reactive to light.  Cardiovascular:     Rate and Rhythm: Normal rate and regular rhythm.     Heart sounds: No murmur heard. Pulmonary:     Effort: No respiratory distress.     Breath sounds: Normal breath sounds.  Musculoskeletal:     Cervical back: Normal range of motion and neck supple.  Neurological:     Mental Status: He is alert and oriented to person, place, and time.       Assessment & Plan:   Roger Palmer" was seen today for medical management of chronic issues.  Diagnoses and all orders for this visit:  Type 2 diabetes mellitus with diabetic neuropathy, without long-term current use of insulin (HCC) -     Cancel: Hemoglobin A1c -     Microalbumin / creatinine urine ratio -     Bayer DCA Hb A1c Waived  Hyperlipidemia associated with type 2 diabetes mellitus (HCC) -     Lipid panel  Hypertension associated with diabetes (HCC) -     CBC with Differential/Platelet -     CMP14+EGFR  Gastroesophageal reflux disease without esophagitis -     omeprazole (PRILOSEC) 20 MG capsule; TAKE ONE (1) CAPSULE EACH DAY  Other orders -     pioglitazone (ACTOS) 30 MG tablet; Take 1 tablet (30 mg total) by mouth daily.      I have discontinued Roger Palmer [name]'s dapagliflozin propanediol. I am also having him start on pioglitazone. Additionally, I am having him maintain his atorvastatin, fenofibrate, finasteride, loratadine, valsartan-hydrochlorothiazide, metFORMIN, tamsulosin, Vitamin D (Ergocalciferol), and omeprazole.  Meds ordered this encounter  Medications   omeprazole (PRILOSEC) 20 MG capsule    Sig: TAKE ONE (1) CAPSULE EACH DAY    Dispense:  90 capsule    Refill:  3   pioglitazone (ACTOS) 30 MG tablet    Sig: Take 1 tablet (30 mg total) by mouth daily.    Dispense:  90 tablet    Refill:  3     Follow-up: Return in about 3 months (around 10/07/2023).  Mechele Claude, M.D.

## 2023-07-11 LAB — CBC WITH DIFFERENTIAL/PLATELET
Basophils Absolute: 0.1 10*3/uL (ref 0.0–0.2)
Basos: 1 %
EOS (ABSOLUTE): 0.1 10*3/uL (ref 0.0–0.4)
Eos: 2 %
Hematocrit: 46.2 % (ref 37.5–51.0)
Hemoglobin: 15.7 g/dL (ref 13.0–17.7)
Immature Grans (Abs): 0 10*3/uL (ref 0.0–0.1)
Immature Granulocytes: 0 %
Lymphocytes Absolute: 2.3 10*3/uL (ref 0.7–3.1)
Lymphs: 28 %
MCH: 31 pg (ref 26.6–33.0)
MCHC: 34 g/dL (ref 31.5–35.7)
MCV: 91 fL (ref 79–97)
Monocytes Absolute: 0.6 10*3/uL (ref 0.1–0.9)
Monocytes: 7 %
Neutrophils Absolute: 5.1 10*3/uL (ref 1.4–7.0)
Neutrophils: 62 %
Platelets: 205 10*3/uL (ref 150–450)
RBC: 5.06 x10E6/uL (ref 4.14–5.80)
RDW: 12.7 % (ref 11.6–15.4)
WBC: 8.2 10*3/uL (ref 3.4–10.8)

## 2023-07-11 LAB — LIPID PANEL
Chol/HDL Ratio: 3.2 {ratio} (ref 0.0–5.0)
Cholesterol, Total: 141 mg/dL (ref 100–199)
HDL: 44 mg/dL (ref >39)
LDL Chol Calc (NIH): 76 mg/dL (ref 0–99)
Triglycerides: 119 mg/dL (ref 0–149)
VLDL Cholesterol Cal: 21 mg/dL (ref 5–40)

## 2023-07-11 LAB — CMP14+EGFR
ALT: 18 [IU]/L (ref 0–44)
AST: 25 [IU]/L (ref 0–40)
Albumin: 4.5 g/dL (ref 3.8–4.8)
Alkaline Phosphatase: 52 [IU]/L (ref 44–121)
BUN/Creatinine Ratio: 18 (ref 10–24)
BUN: 22 mg/dL (ref 8–27)
Bilirubin Total: 0.4 mg/dL (ref 0.0–1.2)
CO2: 20 mmol/L (ref 20–29)
Calcium: 10 mg/dL (ref 8.6–10.2)
Chloride: 104 mmol/L (ref 96–106)
Creatinine, Ser: 1.2 mg/dL (ref 0.76–1.27)
Globulin, Total: 2.6 g/dL (ref 1.5–4.5)
Sodium: 138 mmol/L (ref 134–144)
Total Protein: 7.1 g/dL (ref 6.0–8.5)
eGFR: 64 mL/min/{1.73_m2} (ref >59)

## 2023-07-11 LAB — MICROALBUMIN / CREATININE URINE RATIO
Creatinine, Urine: 128.2 mg/dL
Microalb/Creat Ratio: 3 mg/g{creat} (ref 0–29)
Microalbumin, Urine: 3.3 ug/mL

## 2023-07-12 ENCOUNTER — Encounter: Payer: Self-pay | Admitting: Family Medicine

## 2023-07-12 NOTE — Progress Notes (Signed)
 Hello Freeland,  Your lab result is normal and/or stable.Some minor variations that are not significant are commonly marked abnormal, but do not represent any medical problem for you.  Best regards, Mechele Claude, M.D.

## 2023-07-14 ENCOUNTER — Other Ambulatory Visit: Payer: Self-pay | Admitting: Family Medicine

## 2023-10-09 ENCOUNTER — Ambulatory Visit: Payer: Medicare Other | Admitting: Family Medicine

## 2023-10-09 ENCOUNTER — Encounter: Payer: Self-pay | Admitting: Family Medicine

## 2023-10-09 VITALS — BP 128/74 | HR 90 | Temp 98.5°F | Ht 69.0 in | Wt 208.0 lb

## 2023-10-09 DIAGNOSIS — E1169 Type 2 diabetes mellitus with other specified complication: Secondary | ICD-10-CM | POA: Diagnosis not present

## 2023-10-09 DIAGNOSIS — I152 Hypertension secondary to endocrine disorders: Secondary | ICD-10-CM

## 2023-10-09 DIAGNOSIS — E785 Hyperlipidemia, unspecified: Secondary | ICD-10-CM | POA: Diagnosis not present

## 2023-10-09 DIAGNOSIS — E1140 Type 2 diabetes mellitus with diabetic neuropathy, unspecified: Secondary | ICD-10-CM | POA: Diagnosis not present

## 2023-10-09 DIAGNOSIS — N400 Enlarged prostate without lower urinary tract symptoms: Secondary | ICD-10-CM

## 2023-10-09 DIAGNOSIS — J310 Chronic rhinitis: Secondary | ICD-10-CM

## 2023-10-09 DIAGNOSIS — Z7984 Long term (current) use of oral hypoglycemic drugs: Secondary | ICD-10-CM

## 2023-10-09 DIAGNOSIS — E1159 Type 2 diabetes mellitus with other circulatory complications: Secondary | ICD-10-CM

## 2023-10-09 LAB — LIPID PANEL

## 2023-10-09 LAB — BAYER DCA HB A1C WAIVED: HB A1C (BAYER DCA - WAIVED): 7.3 % — ABNORMAL HIGH (ref 4.8–5.6)

## 2023-10-09 MED ORDER — ATORVASTATIN CALCIUM 40 MG PO TABS: ORAL_TABLET | ORAL | 3 refills | Status: AC

## 2023-10-09 MED ORDER — FINASTERIDE 5 MG PO TABS: ORAL_TABLET | ORAL | 3 refills | Status: AC

## 2023-10-09 MED ORDER — FENOFIBRATE 160 MG PO TABS: ORAL_TABLET | ORAL | 3 refills | Status: AC

## 2023-10-09 MED ORDER — LORATADINE 10 MG PO TABS: 10.0000 mg | ORAL_TABLET | ORAL | 3 refills | Status: AC | PRN

## 2023-10-09 NOTE — Progress Notes (Signed)
 Subjective:  Patient ID: Roger Palmer,  male    DOB: 01-11-1951  Age: 73 y.o.    CC: Medical Management of Chronic Issues (3 month)   HPI Roger Palmer presents for  follow-up of hypertension. Patient has no history of headache chest pain or shortness of breath or recent cough. Patient also denies symptoms of TIA such as numbness weakness lateralizing. Patient denies side effects from medication. States taking it regularly.  Patient also  in for follow-up of elevated cholesterol. Doing well without complaints on current medication. Denies side effects  including myalgia and arthralgia and nausea. Also in today for liver function testing. Currently no chest pain, shortness of breath or other cardiovascular related symptoms noted.  Follow-up of diabetes. Patient does check blood sugar at home. Readings run between 140 and 160 Patient denies symptoms such as excessive hunger or urinary frequency, excessive hunger, nausea No significant hypoglycemic spells noted. Medications reviewed. Pt reports taking them regularly. Pt. denies complication/adverse reaction today.    History Lancer has a past medical history of Allergy, Back pain, Bee sting allergy, BPH (benign prostatic hypertrophy), Diabetes mellitus without complication (HCC), Gastritis, GERD (gastroesophageal reflux disease), Herniated disc, Hypercholesteremia, Hypertension, Nephrolithiasis, Obesity, PONV (postoperative nausea and vomiting), Reflux, and Vitamin D  deficiency.   He has a past surgical history that includes Inguinal hernia repair; kidney stones; ORIF R elbow (07/12/2012); ORIF R wirst (07/12/2012); Open reduction internal fixation (orif) distal radial fracture (Right, 07/11/2012); ORIF radial fracture (Right, 07/11/2012); ORIF acetabular fracture (Right, 07/14/2012); Fracture surgery; and Eye surgery (Right).   His family history includes ADD / ADHD in his son; Anxiety disorder in his son; CAD in his paternal  grandmother; Cancer in his maternal grandmother and sister; Diabetes in his mother, sister, and sister; Heart attack in his maternal grandfather; Heart attack (age of onset: 40) in his mother; Hypertension in his father and mother; Nephrolithiasis in his brother; Stroke in his father.He reports that he has never smoked. He has never used smokeless tobacco. He reports current alcohol use. He reports that he does not use drugs.  Current Outpatient Medications on File Prior to Visit  Medication Sig Dispense Refill   metFORMIN  (GLUCOPHAGE -XR) 500 MG 24 hr tablet Take 1 tablet (500 mg total) by mouth 2 (two) times daily with a meal. 180 tablet 3   omeprazole  (PRILOSEC) 20 MG capsule TAKE ONE (1) CAPSULE EACH DAY 90 capsule 3   pioglitazone  (ACTOS ) 30 MG tablet Take 1 tablet (30 mg total) by mouth daily. 90 tablet 3   tamsulosin  (FLOMAX ) 0.4 MG CAPS capsule Take 2 capsules (0.8 mg total) by mouth daily after supper. TAKE ONE (1) CAPSULE EACH DAY (Patient taking differently: Take 0.8 mg by mouth daily after supper.) 180 capsule 3   valsartan -hydrochlorothiazide  (DIOVAN -HCT) 320-25 MG tablet Take 1 tablet by mouth daily. 90 tablet 3   Vitamin D , Ergocalciferol , (DRISDOL ) 1.25 MG (50000 UNIT) CAPS capsule TAKE 1 CAPSULE BY MOUTH EVERY 7 DAYS 13 capsule 3   No current facility-administered medications on file prior to visit.    ROS Review of Systems  Constitutional:  Negative for fever.  Respiratory:  Negative for shortness of breath.   Cardiovascular:  Negative for chest pain.  Musculoskeletal:  Negative for arthralgias.  Skin:  Negative for rash.    Objective:  BP 128/74   Pulse 90   Temp 98.5 F (36.9 C)   Ht 5\' 9"  (1.753 m)   Wt 208 lb (94.3 kg)   SpO2  100%   BMI 30.72 kg/m   BP Readings from Last 3 Encounters:  10/09/23 128/74  07/10/23 123/69  04/01/23 115/71    Wt Readings from Last 3 Encounters:  10/09/23 208 lb (94.3 kg)  07/10/23 206 lb (93.4 kg)  04/01/23 206 lb 12.8 oz  (93.8 kg)    Lab Results  Component Value Date   HGBA1C 7.3 (H) 10/09/2023   HGBA1C 7.5 (H) 07/10/2023   HGBA1C 8.1 (H) 04/01/2023    Physical Exam Vitals reviewed.  Constitutional:      Appearance: He is well-developed.  HENT:     Head: Normocephalic and atraumatic.     Right Ear: External ear normal.     Left Ear: External ear normal.     Mouth/Throat:     Pharynx: No oropharyngeal exudate or posterior oropharyngeal erythema.  Eyes:     Pupils: Pupils are equal, round, and reactive to light.  Cardiovascular:     Rate and Rhythm: Normal rate and regular rhythm.     Heart sounds: No murmur heard. Pulmonary:     Effort: No respiratory distress.     Breath sounds: Normal breath sounds.  Musculoskeletal:     Cervical back: Normal range of motion and neck supple.  Neurological:     Mental Status: He is alert and oriented to person, place, and time.         Assessment & Plan:  Type 2 diabetes mellitus with diabetic neuropathy, without long-term current use of insulin  (HCC) -     CBC with Differential/Platelet -     CMP14+EGFR -     Lipid panel -     Bayer DCA Hb A1c Waived  Hyperlipidemia associated with type 2 diabetes mellitus (HCC) -     Atorvastatin  Calcium ; TAKE ONE (1) TABLET BY MOUTH EVERY DAY  Dispense: 90 tablet; Refill: 3 -     Fenofibrate ; TAKE ONE (1) TABLET EACH DAY  Dispense: 90 tablet; Refill: 3 -     CBC with Differential/Platelet -     CMP14+EGFR -     Lipid panel -     Bayer DCA Hb A1c Waived  Hypertension associated with diabetes (HCC) -     CBC with Differential/Platelet -     CMP14+EGFR -     Lipid panel -     Bayer DCA Hb A1c Waived  Benign prostatic hyperplasia without lower urinary tract symptoms -     Finasteride ; TAKE ONE (1) TABLET EACH DAY  Dispense: 90 tablet; Refill: 3 -     CBC with Differential/Platelet -     CMP14+EGFR -     Lipid panel -     Bayer DCA Hb A1c Waived  Chronic rhinitis -     Loratadine ; Take 1 tablet (10 mg  total) by mouth as needed for allergies.  Dispense: 90 tablet; Refill: 3 -     CBC with Differential/Platelet -     CMP14+EGFR -     Lipid panel -     Bayer DCA Hb A1c Waived    Follow-up: Return in about 4 months (around 02/09/2024) for diabetes.  Roise Cleaver, M.D.

## 2023-10-10 ENCOUNTER — Ambulatory Visit: Payer: Self-pay | Admitting: Family Medicine

## 2023-10-10 LAB — CMP14+EGFR
ALT: 17 IU/L (ref 0–44)
AST: 18 IU/L (ref 0–40)
Albumin: 4.2 g/dL (ref 3.8–4.8)
Alkaline Phosphatase: 51 IU/L (ref 44–121)
BUN/Creatinine Ratio: 17 (ref 10–24)
BUN: 18 mg/dL (ref 8–27)
Bilirubin Total: 0.4 mg/dL (ref 0.0–1.2)
CO2: 21 mmol/L (ref 20–29)
Calcium: 9.9 mg/dL (ref 8.6–10.2)
Chloride: 105 mmol/L (ref 96–106)
Creatinine, Ser: 1.03 mg/dL (ref 0.76–1.27)
Globulin, Total: 2.6 g/dL (ref 1.5–4.5)
Glucose: 155 mg/dL — ABNORMAL HIGH (ref 70–99)
Potassium: 4.7 mmol/L (ref 3.5–5.2)
Sodium: 142 mmol/L (ref 134–144)
Total Protein: 6.8 g/dL (ref 6.0–8.5)
eGFR: 76 mL/min/{1.73_m2} (ref >59)

## 2023-10-10 LAB — CBC WITH DIFFERENTIAL/PLATELET
Basophils Absolute: 0 10*3/uL (ref 0.0–0.2)
Basos: 1 %
EOS (ABSOLUTE): 0.2 10*3/uL (ref 0.0–0.4)
Eos: 3 %
Hematocrit: 45.6 % (ref 37.5–51.0)
Hemoglobin: 14.8 g/dL (ref 13.0–17.7)
Immature Grans (Abs): 0 10*3/uL (ref 0.0–0.1)
Immature Granulocytes: 0 %
Lymphocytes Absolute: 1.7 10*3/uL (ref 0.7–3.1)
Lymphs: 29 %
MCH: 30.6 pg (ref 26.6–33.0)
MCHC: 32.5 g/dL (ref 31.5–35.7)
MCV: 94 fL (ref 79–97)
Monocytes Absolute: 0.5 10*3/uL (ref 0.1–0.9)
Monocytes: 9 %
Neutrophils Absolute: 3.4 10*3/uL (ref 1.4–7.0)
Neutrophils: 58 %
Platelets: 193 10*3/uL (ref 150–450)
RBC: 4.84 x10E6/uL (ref 4.14–5.80)
RDW: 12.6 % (ref 11.6–15.4)
WBC: 5.8 10*3/uL (ref 3.4–10.8)

## 2023-10-10 LAB — LIPID PANEL
Cholesterol, Total: 140 mg/dL (ref 100–199)
HDL: 58 mg/dL (ref >39)
LDL CALC COMMENT:: 2.4 ratio (ref 0.0–5.0)
LDL Chol Calc (NIH): 67 mg/dL (ref 0–99)
Triglycerides: 74 mg/dL (ref 0–149)
VLDL Cholesterol Cal: 15 mg/dL (ref 5–40)

## 2023-10-10 NOTE — Progress Notes (Signed)
 Hello Freeland,  Your lab result is normal and/or stable.Some minor variations that are not significant are commonly marked abnormal, but do not represent any medical problem for you.  Best regards, Mechele Claude, M.D.

## 2023-10-20 DIAGNOSIS — I10 Essential (primary) hypertension: Secondary | ICD-10-CM | POA: Diagnosis not present

## 2023-12-24 ENCOUNTER — Other Ambulatory Visit: Payer: Self-pay | Admitting: Family Medicine

## 2023-12-24 DIAGNOSIS — E1140 Type 2 diabetes mellitus with diabetic neuropathy, unspecified: Secondary | ICD-10-CM

## 2024-01-13 ENCOUNTER — Ambulatory Visit (INDEPENDENT_AMBULATORY_CARE_PROVIDER_SITE_OTHER): Payer: Medicare Other

## 2024-01-13 VITALS — BP 128/74 | HR 76 | Ht 69.0 in | Wt 208.0 lb

## 2024-01-13 DIAGNOSIS — Z0000 Encounter for general adult medical examination without abnormal findings: Secondary | ICD-10-CM | POA: Diagnosis not present

## 2024-01-13 NOTE — Progress Notes (Signed)
 Subjective:   Roger Palmer is a 73 y.o. who presents for a Medicare Wellness preventive visit.  As a reminder, Annual Wellness Visits don't include a physical exam, and some assessments may be limited, especially if this visit is performed virtually. We may recommend an in-person follow-up visit with your provider if needed.  Visit Complete: Virtual I connected with  Roger Palmer on 01/13/24 by a audio enabled telemedicine application and verified that I am speaking with the correct person using two identifiers.  Patient Location: Home  Provider Location: Home Office  I discussed the limitations of evaluation and management by telemedicine. The patient expressed understanding and agreed to proceed.  Vital Signs: Because this visit was a virtual/telehealth visit, some criteria may be missing or patient reported. Any vitals not documented were not able to be obtained and vitals that have been documented are patient reported.  VideoDeclined- This patient declined Librarian, academic. Therefore the visit was completed with audio only.  Persons Participating in Visit: Patient.  AWV Questionnaire: No: Patient Medicare AWV questionnaire was not completed prior to this visit.  Cardiac Risk Factors include: advanced age (>78men, >21 women);diabetes mellitus;dyslipidemia;hypertension;male gender     Objective:    Today's Vitals   01/13/24 0933  BP: 128/74  Pulse: 76  Weight: 208 lb (94.3 kg)  Height: 5' 9 (1.753 m)   Body mass index is 30.72 kg/m.     01/13/2024    9:28 AM 01/09/2023    9:36 AM 01/07/2022    9:10 AM 01/04/2021    9:18 AM 06/11/2018    8:16 AM 06/05/2016   10:10 AM 07/23/2012    8:41 PM  Advanced Directives  Does Patient Have a Medical Advance Directive? No Yes Yes Yes Yes  Yes  Patient does not have advance directive;Patient would like information   Type of Advance Directive  Healthcare Power of [place];Living will Healthcare Power  of [place];Living will Healthcare Power of [place];Living will Living will;Healthcare Power of Attorney    Copy of Healthcare Power of Attorney in Chart?  No - copy requested No - copy requested No - copy requested No - copy requested     Would patient like information on creating a medical advance directive?       Advance directive packet given   Pre-existing out of facility DNR order (yellow form or pink MOST form)       No      Data saved with a previous flowsheet row definition    Current Medications (verified) Outpatient Encounter Medications as of 01/13/2024  Medication Sig   atorvastatin  (LIPITOR) 40 MG tablet TAKE ONE (1) TABLET BY MOUTH EVERY DAY   fenofibrate  160 MG tablet TAKE ONE (1) TABLET EACH DAY   finasteride  (PROSCAR ) 5 MG tablet TAKE ONE (1) TABLET EACH DAY   loratadine  (CLARITIN ) 10 MG tablet Take 1 tablet (10 mg total) by mouth as needed for allergies.   metFORMIN  (GLUCOPHAGE -XR) 500 MG 24 hr tablet TAKE 1 TABLET BY MOUTH TWICE DAILY WITH MEALS   omeprazole  (PRILOSEC) 20 MG capsule TAKE ONE (1) CAPSULE EACH DAY   pioglitazone  (ACTOS ) 30 MG tablet Take 1 tablet (30 mg total) by mouth daily.   tamsulosin  (FLOMAX ) 0.4 MG CAPS capsule Take 2 capsules (0.8 mg total) by mouth daily after supper. TAKE ONE (1) CAPSULE EACH DAY (Patient taking differently: Take 0.8 mg by mouth daily after supper.)   valsartan -hydrochlorothiazide  (DIOVAN -HCT) 320-25 MG tablet TAKE ONE (1) TABLET BY  MOUTH EVERY DAY   Vitamin D , Ergocalciferol , (DRISDOL ) 1.25 MG (50000 UNIT) CAPS capsule TAKE 1 CAPSULE BY MOUTH EVERY 7 DAYS   No facility-administered encounter medications on file as of 01/13/2024.    Allergies (verified) Patient has no known allergies.   History: Past Medical History:  Diagnosis Date   Allergy    seasonal    Back pain    Bee sting allergy    BPH (benign prostatic hypertrophy)    Diabetes mellitus without complication (HCC)    Gastritis    GERD (gastroesophageal reflux  disease)    Herniated disc    c7-c7, c5-6, c3-4, c4-5   Hypercholesteremia    Hypertension    Nephrolithiasis    Obesity    PONV (postoperative nausea and vomiting)    Reflux    Vitamin D  deficiency    Past Surgical History:  Procedure Laterality Date   EYE SURGERY Right    detached retina   FRACTURE SURGERY     right hip    INGUINAL HERNIA REPAIR     right   kidney stones     x2 Dr. Nicholaus    OPEN REDUCTION INTERNAL FIXATION (ORIF) DISTAL RADIAL FRACTURE Right 07/11/2012   Procedure: OPEN REDUCTION INTERNAL FIXATION (ORIF) DISTAL RADIAL FRACTURE;  Surgeon: Donnice DELENA Robinsons, MD;  Location: [facility] OR;  Service: Orthopedics;  Laterality: Right;   ORIF ACETABULAR FRACTURE Right 07/14/2012   Procedure: OPEN REDUCTION INTERNAL FIXATION (ORIF) ACETABULAR FRACTURE;  Surgeon: Ozell VEAR Bruch, MD;  Location: [facility] OR;  Service: Orthopedics;  Laterality: Right;   ORIF R elbow  07/12/2012   ORIF R wirst  07/12/2012   ORIF RADIAL FRACTURE Right 07/11/2012   Procedure: OPEN REDUCTION INTERNAL FIXATION (ORIF) RADIAL head FRACTURE;  Surgeon: Donnice DELENA Robinsons, MD;  Location: [facility] OR;  Service: Orthopedics;  Laterality: Right;   Family History  Problem Relation Age of Onset   Heart attack Mother 57   Diabetes Mother        NIDDM   Hypertension Mother    Hypertension Father    Stroke Father    Nephrolithiasis Brother    Cancer Sister        unsure - ? cervical    Diabetes Sister    Diabetes Sister    Anxiety disorder Son        depression    ADD / ADHD Son    Cancer Maternal Grandmother        unknown   Heart attack Maternal Grandfather    CAD Paternal Grandmother    Social History   Socioeconomic History   Marital status: Married    Spouse name: Roger Palmer   Number of children: 2   Years of education: Not on file   Highest education level: Not on file  Occupational History   Occupation: retired    Comment: [employer]  Tobacco Use   Smoking status: Never   Smokeless tobacco:  Never  Vaping Use   Vaping status: Never Used  Substance and Sexual Activity   Alcohol use: Yes    Comment: social   Drug use: No   Sexual activity: Yes  Other Topics Concern   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    Works in garden, owns 5 acres, mows 5 yards, very physically active   Lives home with wife - daughter and 2 granddaughters live with him   Social Drivers of Corporate investment banker  Strain: Low Risk  (01/13/2024)   Overall Financial Resource Strain (CARDIA)    Difficulty of Paying Living Expenses: Not hard at all  Food Insecurity: No Food Insecurity (01/13/2024)   Hunger Vital Sign    Worried About Running Out of Food in the Last Year: Never true    Ran Out of Food in the Last Year: Never true  Transportation Needs: No Transportation Needs (01/09/2023)   PRAPARE - Administrator, Civil Service (Medical): No    Lack of Transportation (Non-Medical): No  Physical Activity: Inactive (01/13/2024)   Exercise Vital Sign    Days of Exercise per Week: 0 days    Minutes of Exercise per Session: 0 min  Stress: No Stress Concern Present (01/13/2024)   [employer] of Occupational Health - Occupational Stress Questionnaire    Feeling of Stress: Not at all  Social Connections: Moderately Integrated (01/13/2024)   Social Connection and Isolation Panel    Frequency of Communication with Friends and Family: More than three times a week    Frequency of Social Gatherings with Friends and Family: More than three times a week    Attends Religious Services: 1 to 4 times per year    Active Member of [employer] or Organizations: No    Attends Banker Meetings: Never    Marital Status: Married    Tobacco Counseling Counseling given: Yes    Clinical Intake:  Pre-visit preparation completed: Yes  Pain : No/denies pain     Nutritional Risks: None Diabetes: No  Lab Results  Component Value Date    HGBA1C 7.3 (H) 10/09/2023   HGBA1C 7.5 (H) 07/10/2023   HGBA1C 8.1 (H) 04/01/2023     How often do you need to have someone help you when you read instructions, pamphlets, or other written materials from your doctor or pharmacy?: 1 - Never  Interpreter Needed?: No  Information entered by :: Roger Palmer   Activities of Daily Living     01/13/2024    9:26 AM  In your present state of health, do you have any difficulty performing the following activities:  Hearing? 1  Vision? 1  Difficulty concentrating or making decisions? 0  Walking or climbing stairs? 0  Dressing or bathing? 0  Doing errands, shopping? 0  Preparing Food and eating ? N  Using the Toilet? N  In the past six months, have you accidently leaked urine? N  Do you have problems with loss of bowel control? N  Managing your Medications? N  Managing your Finances? N  Housekeeping or managing your Housekeeping? N    Patient Care Team: Zollie Lowers, MD as PCP - General (Family Medicine) Ladora Ross Lacy Phebe, MD as Referring Physician (Optometry) Nicholaus Tanda LITTIE DOUGLAS, MD as Attending Physician (Urology) Pyrtle, Gordy HERO, MD as Consulting Physician (Gastroenterology) Jinx Grate, AUD (Audiology) Court Pulling, MD as Referring Physician (Dermatology)  I have updated your Care Teams any recent Medical Services you may have received from other providers in the past year.     Assessment:   This is a routine wellness examination for Roger Palmer.  Hearing/Vision screen Hearing Screening - Comments:: Pt wear hearing aids Vision Screening - Comments:: Pt wear glasses still have a little prob w/vision/pt DR. [name] in [place] ov yr ago   Goals Addressed             This Visit's Progress    Increase physical activity   On track    Stay active and  independent       Depression Screen     01/13/2024    9:31 AM 07/10/2023    2:52 PM 04/01/2023   11:12 AM 01/09/2023    9:35 AM 10/31/2022    8:34 AM 07/02/2022     8:11 AM 04/01/2022    9:34 AM  PHQ 2/9 Scores  PHQ - 2 Score 0 0 0 0 0 0 0  PHQ- 9 Score 0 0         Fall Risk     01/13/2024    9:22 AM 10/09/2023    8:00 AM 07/10/2023    2:53 PM 04/01/2023   11:12 AM 01/09/2023    9:34 AM  Fall Risk   Falls in the past year? 0 0 0 0 0  Number falls in past yr: 0 0 0  0  Injury with Fall? 0 0 0  0  Risk for fall due to : No Fall Risks No Fall Risks No Fall Risks  No Fall Risks  Follow up Falls evaluation completed Falls evaluation completed Falls evaluation completed  Falls prevention discussed    MEDICARE RISK AT HOME:  Medicare Risk at Home Any stairs in or around the home?: No If so, are there any without handrails?: No Home free of loose throw rugs in walkways, pet beds, electrical cords, etc?: Yes Adequate lighting in your home to reduce risk of falls?: Yes Life alert?: Yes Use of a cane, walker or w/c?: No Grab bars in the bathroom?: Yes Shower chair or bench in shower?: No Elevated toilet seat or a handicapped toilet?: Yes  TIMED UP AND GO:  Was the test performed?  no  Cognitive Function: 6CIT completed    06/11/2018    8:18 AM  MMSE - Mini Mental State Exam  Orientation to time 5  Orientation to Place 5  Registration 3  Attention/ Calculation 5  Recall 3  Language- name 2 objects 2  Language- repeat 1  Language- follow 3 step command 3  Language- read & follow direction 1  Write a sentence 1  Copy design 1  Total score 30        01/13/2024    9:32 AM 01/09/2023    9:36 AM 01/07/2022    9:12 AM  6CIT Screen  What Year? 0 points 0 points 0 points  What month? 0 points 0 points 0 points  What time? 0 points 0 points 0 points  Count back from 20 0 points 0 points 0 points  Months in reverse 0 points 0 points 2 points  Repeat phrase 0 points 0 points 2 points  Total Score 0 points 0 points 4 points    Immunizations Immunization History  Administered Date(s) Administered   Fluad Quad(high Dose 65+) 05/24/2019,  02/29/2020, 04/03/2021   INFLUENZA, HIGH DOSE SEASONAL PF 04/01/2016, 04/29/2017, 02/04/2018   Influenza Split 07/25/2012   Influenza,inj,Quad PF,6+ Mos 03/11/2013, 02/25/2014, 06/28/2015   Influenza-Unspecified 03/17/2023   Moderna SARS-COV2 Booster Vaccination 04/26/2020   Moderna Sars-Covid-2 Vaccination 07/08/2019, 08/06/2019   Pneumococcal Conjugate-13 04/01/2013   Pneumococcal Polysaccharide-23 07/25/2012, 05/24/2019   Tdap 07/04/2021   Unspecified SARS-COV-2 Vaccination 03/04/2023   Zoster Recombinant(Shingrix) 01/01/2021, 04/03/2021   Zoster, Live 05/12/2012    Screening Tests Health Maintenance  Topic Date Due   OPHTHALMOLOGY EXAM  05/24/2022   FOOT EXAM  12/25/2022   INFLUENZA VACCINE  12/12/2023   COVID-19 Vaccine (4 - Mixed Product risk 2024-25 season) 01/12/2024   HEMOGLOBIN A1C  04/10/2024  Diabetic kidney evaluation - Urine ACR  07/09/2024   Diabetic kidney evaluation - eGFR measurement  10/08/2024   Medicare Annual Wellness (AWV)  01/12/2025   Colonoscopy  06/05/2026   DTaP/Tdap/Td (2 - Td or Tdap) 07/05/2031   Pneumococcal Vaccine: 50+ Years  Completed   Hepatitis C Screening  Completed   Zoster Vaccines- Shingrix  Completed   HPV VACCINES  Aged Out   Meningococcal B Vaccine  Aged Out    Health Maintenance  Health Maintenance Due  Topic Date Due   OPHTHALMOLOGY EXAM  05/24/2022   FOOT EXAM  12/25/2022   INFLUENZA VACCINE  12/12/2023   COVID-19 Vaccine (4 - Mixed Product risk 2024-25 season) 01/12/2024   Health Maintenance Items Addressed: See Nurse Notes at the end of this note  Additional Screening:  Vision Screening: Recommended annual ophthalmology exams for early detection of glaucoma and other disorders of the eye. Would you like a referral to an eye doctor? No    Dental Screening: Recommended annual dental exams for proper oral hygiene  Community Resource Referral / Chronic Care Management: CRR required this visit?  No   CCM required  this visit?  No   Plan:    I have personally reviewed and noted the following in the patient's chart:   Medical and social history Use of alcohol, tobacco or illicit drugs  Current medications and supplements including opioid prescriptions. Patient is not currently taking opioid prescriptions. Functional ability and status Nutritional status Physical activity Advanced directives List of other physicians Hospitalizations, surgeries, and ER visits in previous 12 months Vitals Screenings to include cognitive, depression, and falls Referrals and appointments  In addition, I have reviewed and discussed with patient certain preventive protocols, quality metrics, and best practice recommendations. A written personalized care plan for preventive services as well as general preventive health recommendations were provided to patient.   Ozie Ned, CMA   01/13/2024   After Visit Summary: (MyChart) Due to this being a telephonic visit, the after visit summary with patients personalized plan was offered to patient via MyChart   Notes: Pt is aware and due for foot exam, diabetic eye exam--going to make appt

## 2024-01-13 NOTE — Patient Instructions (Addendum)
 [name] , Thank you for taking time out of your busy schedule to complete your Annual Wellness Visit with me. I enjoyed our conversation and look forward to speaking with you again next year. I, as well as your care team,  appreciate your ongoing commitment to your health goals. Please review the following plan we discussed and let me know if I can assist you in the future. Your Game plan/ To Do List    Referrals: If you haven't heard from the office you've been referred to, please reach out to them at the phone provided.   Follow up Visits: We will see or speak with you next year for your Next Medicare AWV with our clinical staff on 01/13/25 at 9:20a.m. Have you seen your provider in the last 6 months (3 months if uncontrolled diabetes)? Yes  Clinician Recommendations:  Aim for 30 minutes of exercise or brisk walking, 6-8 glasses of water, and 5 servings of fruits and vegetables each day.       This is a list of the screenings recommended for you:  Health Maintenance  Topic Date Due   Eye exam for diabetics  05/24/2022   Complete foot exam   12/25/2022   Flu Shot  12/12/2023   Medicare Annual Wellness Visit  01/09/2024   COVID-19 Vaccine (4 - Mixed Product risk 2024-25 season) 01/12/2024   Hemoglobin A1C  04/10/2024   Yearly kidney health urinalysis for diabetes  07/09/2024   Yearly kidney function blood test for diabetes  10/08/2024   Colon Cancer Screening  06/05/2026   DTaP/Tdap/Td vaccine (2 - Td or Tdap) 07/05/2031   Pneumococcal Vaccine for age over 28  Completed   Hepatitis C Screening  Completed   Zoster (Shingles) Vaccine  Completed   HPV Vaccine  Aged Out   Meningitis B Vaccine  Aged Out    Advanced directives: (Declined) Advance directive discussed with you today. Even though you declined this today, please call our office should you change your mind, and we can give you the proper paperwork for you to fill out. Advance Care Planning is important because it:  [x]  Makes  sure you receive the medical care that is consistent with your values, goals, and preferences  [x]  It provides guidance to your family and loved ones and reduces their decisional burden about whether or not they are making the right decisions based on your wishes.  Follow the link provided in your after visit summary or read over the paperwork we have mailed to you to help you started getting your Advance Directives in place. If you need assistance in completing these, please reach out to us  so that we can help you!  See attachments for Preventive Care and Fall Prevention Tips.

## 2024-02-09 ENCOUNTER — Ambulatory Visit: Admitting: Family Medicine

## 2024-02-09 ENCOUNTER — Encounter: Payer: Self-pay | Admitting: Family Medicine

## 2024-02-09 ENCOUNTER — Ambulatory Visit: Payer: Self-pay | Admitting: Family Medicine

## 2024-02-09 VITALS — BP 113/69 | HR 68 | Temp 97.8°F | Ht 69.0 in | Wt 214.0 lb

## 2024-02-09 DIAGNOSIS — Z125 Encounter for screening for malignant neoplasm of prostate: Secondary | ICD-10-CM

## 2024-02-09 DIAGNOSIS — Z23 Encounter for immunization: Secondary | ICD-10-CM | POA: Diagnosis not present

## 2024-02-09 DIAGNOSIS — E785 Hyperlipidemia, unspecified: Secondary | ICD-10-CM

## 2024-02-09 DIAGNOSIS — Z7984 Long term (current) use of oral hypoglycemic drugs: Secondary | ICD-10-CM | POA: Diagnosis not present

## 2024-02-09 DIAGNOSIS — I152 Hypertension secondary to endocrine disorders: Secondary | ICD-10-CM

## 2024-02-09 DIAGNOSIS — E1169 Type 2 diabetes mellitus with other specified complication: Secondary | ICD-10-CM | POA: Diagnosis not present

## 2024-02-09 DIAGNOSIS — E1159 Type 2 diabetes mellitus with other circulatory complications: Secondary | ICD-10-CM | POA: Diagnosis not present

## 2024-02-09 DIAGNOSIS — E1140 Type 2 diabetes mellitus with diabetic neuropathy, unspecified: Secondary | ICD-10-CM

## 2024-02-09 LAB — BAYER DCA HB A1C WAIVED: HB A1C (BAYER DCA - WAIVED): 6.8 % — ABNORMAL HIGH (ref 4.8–5.6)

## 2024-02-09 NOTE — Progress Notes (Signed)
 Subjective:  Patient ID: Roger Palmer, male    DOB: 1950/06/16  Age: 73 y.o. MRN: [id number]  CC: Medical Management of Chronic Issues   HPI  Discussed the use of AI scribe software for clinical note transcription with the patient, who gave verbal consent to proceed.  History of Present Illness Roger Palmer is a 73 year old male who presents for a routine follow-up visit.  Blood sugar levels are stable with fasting morning readings typically in the 130s to 140s. He is currently taking metformin  and pioglitazone  for diabetes management.  He is on atorvastatin  and fenofibrate  for cholesterol management, which he takes daily without issues.  For gastroesophageal reflux disease, he takes omeprazole , which effectively controls his symptoms.  He is on a combination pill of valsartan  and hydrochlorothiazide  for blood pressure management, which he takes regularly.  He takes tamsulosin  and finasteride  for urinary symptoms, reporting improvement with these medications. He usually does not have to get up at night to urinate, or only once, allowing him to get a good night's sleep.  He takes a vitamin D  tablet once a week.  He mentions that he is due for cataract surgery and is planning to schedule it soon. He had a detached retina repaired approximately three years ago.  No chest pain, shortness of breath, cough, heart palpitations, or swelling in the feet and ankles.          02/09/2024    8:36 AM 01/13/2024    9:31 AM 07/10/2023    2:52 PM  Depression screen PHQ 2/9  Decreased Interest 0 0 0  Down, Depressed, Hopeless 0 0 0  PHQ - 2 Score 0 0 0  Altered sleeping  0 0  Tired, decreased energy  0 0  Change in appetite  0 0  Feeling bad or failure about yourself   0 0  Trouble concentrating  0 0  Moving slowly or fidgety/restless  0 0  Suicidal thoughts  0 0  PHQ-9 Score  0 0  Difficult doing work/chores Not difficult at all Not difficult at all Not difficult at all     History Roger Palmer has a past medical history of Allergy, Back pain, Bee sting allergy, BPH (benign prostatic hypertrophy), Diabetes mellitus without complication (HCC), Gastritis, GERD (gastroesophageal reflux disease), Herniated disc, Hypercholesteremia, Hypertension, Nephrolithiasis, Obesity, PONV (postoperative nausea and vomiting), Reflux, and Vitamin D  deficiency.   He has a past surgical history that includes Inguinal hernia repair; kidney stones; ORIF R elbow (07/12/2012); ORIF R wirst (07/12/2012); Open reduction internal fixation (orif) distal radial fracture (Right, 07/11/2012); ORIF radial fracture (Right, 07/11/2012); ORIF acetabular fracture (Right, 07/14/2012); Fracture surgery; and Eye surgery (Right).   His family history includes ADD / ADHD in his son; Anxiety disorder in his son; CAD in his paternal grandmother; Cancer in his maternal grandmother and sister; Diabetes in his mother, sister, and sister; Heart attack in his maternal grandfather; Heart attack (age of onset: 46) in his mother; Hypertension in his father and mother; Nephrolithiasis in his brother; Stroke in his father.He reports that he has never smoked. He has never used smokeless tobacco. He reports current alcohol use. He reports that he does not use drugs.    ROS Review of Systems  Constitutional: Negative.   HENT: Negative.    Eyes:  Negative for visual disturbance.  Respiratory:  Negative for cough and shortness of breath.   Cardiovascular:  Negative for chest pain and leg swelling.  Gastrointestinal:  Negative for  abdominal pain, diarrhea, nausea and vomiting.  Genitourinary:  Negative for difficulty urinating.  Musculoskeletal:  Negative for arthralgias and myalgias.  Skin:  Negative for rash.  Neurological:  Negative for headaches.  Psychiatric/Behavioral:  Negative for sleep disturbance.     Objective:  BP 113/69   Pulse 68   Temp 97.8 F (36.6 C)   Ht 5' 9 (1.753 m)   Wt 214 lb (97.1 kg)    SpO2 98%   BMI 31.60 kg/m   BP Readings from Last 3 Encounters:  02/09/24 113/69  01/13/24 128/74  10/09/23 128/74    Wt Readings from Last 3 Encounters:  02/09/24 214 lb (97.1 kg)  01/13/24 208 lb (94.3 kg)  10/09/23 208 lb (94.3 kg)     Physical Exam Physical Exam GENERAL: Alert, cooperative, well developed, no acute distress HEENT: Normocephalic, normal oropharynx, moist mucous membranes CHEST: Clear to auscultation bilaterally, no wheezes, rhonchi, or crackles CARDIOVASCULAR: Normal heart rate and rhythm, S1 and S2 normal without murmurs ABDOMEN: Soft, non-tender, non-distended, without organomegaly, normal bowel sounds EXTREMITIES: No cyanosis or edema NEUROLOGICAL: Cranial nerves grossly intact, moves all extremities without gross motor or sensory deficit   Assessment & Plan:  Hyperlipidemia associated with type 2 diabetes mellitus (HCC) -     Lipid panel  Screening PSA (prostate specific antigen) -     PSA, total and free  Type 2 diabetes mellitus with diabetic neuropathy, without long-term current use of insulin  (HCC) -     CMP14+EGFR -     Bayer DCA Hb A1c Waived -     Vitamin B12  Hypertension associated with diabetes (HCC) -     CBC with Differential/Platelet  Encounter for immunization -     Flu vaccine HIGH DOSE PF(Fluzone Trivalent)    Assessment and Plan Assessment & Plan Type 2 diabetes mellitus   Type 2 diabetes mellitus is well-controlled with a recent A1c of 6.8%. Fasting blood glucose levels are typically in the 130s to 140s mg/dL. He is on metformin  and pioglitazone  for management. Continue metformin  and pioglitazone .  Hypertension   Hypertension is managed with valsartan  and hydrochlorothiazide . Continue valsartan  and hydrochlorothiazide .  Hyperlipidemia   Hyperlipidemia is managed with atorvastatin  and fenofibrate . He reports no issues with the medications. Continue atorvastatin  and fenofibrate .  Benign prostatic hyperplasia with lower  urinary tract symptoms   Benign prostatic hyperplasia is managed with tamsulosin  and finasteride . Symptoms have improved, with nocturia reduced to once per night, allowing for adequate sleep. Finasteride  may take up to a year for full effect. Continue tamsulosin  and finasteride . Monitor urinary symptoms and adjust treatment if nocturia increases to two or more times per night.  Gastroesophageal reflux disease   Gastroesophageal reflux disease is well-controlled with omeprazole . He reports no significant reflux symptoms. Continue omeprazole .  Cataract, planned for surgery   Cataract surgery is planned but not yet scheduled. He is waiting to schedule the procedure and plans to have an eye exam post-surgery to update his glasses prescription. Contact ophthalmologist to schedule cataract surgery. Plan for post-surgical eye exam to update glasses prescription.       Follow-up: Return in about 4 months (around 06/10/2024).  Butler Der, M.D.

## 2024-02-10 LAB — CBC WITH DIFFERENTIAL/PLATELET
Basophils Absolute: 0.1 x10E3/uL (ref 0.0–0.2)
Basos: 1 %
EOS (ABSOLUTE): 0.2 x10E3/uL (ref 0.0–0.4)
Eos: 3 %
Hematocrit: 42.8 % (ref 37.5–51.0)
Hemoglobin: 14.6 g/dL (ref 13.0–17.7)
Immature Grans (Abs): 0 x10E3/uL (ref 0.0–0.1)
Immature Granulocytes: 0 %
Lymphocytes Absolute: 1.8 x10E3/uL (ref 0.7–3.1)
Lymphs: 35 %
MCH: 32.7 pg (ref 26.6–33.0)
MCHC: 34.1 g/dL (ref 31.5–35.7)
MCV: 96 fL (ref 79–97)
Monocytes Absolute: 0.5 x10E3/uL (ref 0.1–0.9)
Monocytes: 9 %
Neutrophils Absolute: 2.7 x10E3/uL (ref 1.4–7.0)
Neutrophils: 52 %
Platelets: 188 x10E3/uL (ref 150–450)
RBC: 4.47 x10E6/uL (ref 4.14–5.80)
RDW: 12.8 % (ref 11.6–15.4)
WBC: 5.2 x10E3/uL (ref 3.4–10.8)

## 2024-02-10 LAB — PSA, TOTAL AND FREE
PSA, Free Pct: 44 %
PSA, Free: 0.22 ng/mL
Prostate Specific Ag, Serum: 0.5 ng/mL (ref 0.0–4.0)

## 2024-02-10 LAB — CMP14+EGFR
ALT: 16 IU/L (ref 0–44)
AST: 18 IU/L (ref 0–40)
Albumin: 4.1 g/dL (ref 3.8–4.8)
Alkaline Phosphatase: 57 IU/L (ref 47–123)
BUN/Creatinine Ratio: 13 (ref 10–24)
BUN: 15 mg/dL (ref 8–27)
Bilirubin Total: 0.4 mg/dL (ref 0.0–1.2)
CO2: 24 mmol/L (ref 20–29)
Calcium: 9.6 mg/dL (ref 8.6–10.2)
Chloride: 103 mmol/L (ref 96–106)
Creatinine, Ser: 1.2 mg/dL (ref 0.76–1.27)
Globulin, Total: 2.5 g/dL (ref 1.5–4.5)
Glucose: 134 mg/dL — ABNORMAL HIGH (ref 70–99)
Potassium: 4.2 mmol/L (ref 3.5–5.2)
Sodium: 141 mmol/L (ref 134–144)
Total Protein: 6.6 g/dL (ref 6.0–8.5)
eGFR: 63 mL/min/1.73 (ref >59)

## 2024-02-10 LAB — LIPID PANEL
Chol/HDL Ratio: 2.5 ratio (ref 0.0–5.0)
Cholesterol, Total: 137 mg/dL (ref 100–199)
HDL: 55 mg/dL (ref >39)
LDL Chol Calc (NIH): 67 mg/dL (ref 0–99)
Triglycerides: 75 mg/dL (ref 0–149)
VLDL Cholesterol Cal: 15 mg/dL (ref 5–40)

## 2024-02-10 LAB — VITAMIN B12: Vitamin B-12: 256 pg/mL (ref 232–1245)

## 2024-02-10 NOTE — Progress Notes (Signed)
 Hello Freeland,  Your lab result is normal and/or stable.Some minor variations that are not significant are commonly marked abnormal, but do not represent any medical problem for you.  Best regards, Mechele Claude, M.D.

## 2024-02-11 ENCOUNTER — Other Ambulatory Visit: Payer: Self-pay | Admitting: Family Medicine

## 2024-02-11 DIAGNOSIS — N400 Enlarged prostate without lower urinary tract symptoms: Secondary | ICD-10-CM

## 2024-02-12 DIAGNOSIS — K080 Exfoliation of teeth due to systemic causes: Secondary | ICD-10-CM | POA: Diagnosis not present

## 2024-03-01 ENCOUNTER — Other Ambulatory Visit: Payer: Self-pay | Admitting: Family Medicine

## 2024-03-23 DIAGNOSIS — L814 Other melanin hyperpigmentation: Secondary | ICD-10-CM | POA: Diagnosis not present

## 2024-03-23 DIAGNOSIS — Z85828 Personal history of other malignant neoplasm of skin: Secondary | ICD-10-CM | POA: Diagnosis not present

## 2024-03-23 DIAGNOSIS — L570 Actinic keratosis: Secondary | ICD-10-CM | POA: Diagnosis not present

## 2024-03-23 DIAGNOSIS — L820 Inflamed seborrheic keratosis: Secondary | ICD-10-CM | POA: Diagnosis not present

## 2024-03-23 DIAGNOSIS — L821 Other seborrheic keratosis: Secondary | ICD-10-CM | POA: Diagnosis not present

## 2024-04-06 DIAGNOSIS — H2513 Age-related nuclear cataract, bilateral: Secondary | ICD-10-CM | POA: Diagnosis not present

## 2024-04-06 DIAGNOSIS — Z961 Presence of intraocular lens: Secondary | ICD-10-CM | POA: Diagnosis not present

## 2024-04-12 ENCOUNTER — Other Ambulatory Visit: Payer: Self-pay | Admitting: Family Medicine

## 2024-04-12 DIAGNOSIS — E1140 Type 2 diabetes mellitus with diabetic neuropathy, unspecified: Secondary | ICD-10-CM

## 2024-04-21 DIAGNOSIS — K080 Exfoliation of teeth due to systemic causes: Secondary | ICD-10-CM | POA: Diagnosis not present

## 2024-05-11 ENCOUNTER — Other Ambulatory Visit: Payer: Self-pay | Admitting: Family Medicine

## 2024-06-14 ENCOUNTER — Ambulatory Visit: Payer: Self-pay | Admitting: Family Medicine

## 2024-07-14 ENCOUNTER — Ambulatory Visit: Admitting: Family Medicine

## 2025-01-13 ENCOUNTER — Ambulatory Visit: Payer: Self-pay
